# Patient Record
Sex: Male | Born: 1935
Health system: Southern US, Community
[De-identification: ages and names within clinical notes are randomized; demographics above are authoritative.]

## PROBLEM LIST (undated history)

## (undated) DIAGNOSIS — R011 Cardiac murmur, unspecified: Secondary | ICD-10-CM

## (undated) DIAGNOSIS — B029 Zoster without complications: Secondary | ICD-10-CM

## (undated) DIAGNOSIS — Z87442 Personal history of urinary calculi: Secondary | ICD-10-CM

## (undated) DIAGNOSIS — I2510 Atherosclerotic heart disease of native coronary artery without angina pectoris: Secondary | ICD-10-CM

## (undated) DIAGNOSIS — K5730 Diverticulosis of large intestine without perforation or abscess without bleeding: Secondary | ICD-10-CM

## (undated) DIAGNOSIS — I493 Ventricular premature depolarization: Secondary | ICD-10-CM

## (undated) DIAGNOSIS — I10 Essential (primary) hypertension: Secondary | ICD-10-CM

## (undated) DIAGNOSIS — M1990 Unspecified osteoarthritis, unspecified site: Secondary | ICD-10-CM

## (undated) DIAGNOSIS — R7303 Prediabetes: Secondary | ICD-10-CM

## (undated) DIAGNOSIS — C4490 Unspecified malignant neoplasm of skin, unspecified: Secondary | ICD-10-CM

## (undated) DIAGNOSIS — E785 Hyperlipidemia, unspecified: Secondary | ICD-10-CM

## (undated) DIAGNOSIS — I429 Cardiomyopathy, unspecified: Secondary | ICD-10-CM

## (undated) DIAGNOSIS — M6530 Trigger finger, unspecified finger: Secondary | ICD-10-CM

## (undated) DIAGNOSIS — N400 Enlarged prostate without lower urinary tract symptoms: Secondary | ICD-10-CM

## (undated) DIAGNOSIS — N189 Chronic kidney disease, unspecified: Secondary | ICD-10-CM

## (undated) DIAGNOSIS — I6529 Occlusion and stenosis of unspecified carotid artery: Secondary | ICD-10-CM

## (undated) DIAGNOSIS — N360 Urethral fistula: Secondary | ICD-10-CM

## (undated) DIAGNOSIS — I5189 Other ill-defined heart diseases: Secondary | ICD-10-CM

## (undated) DIAGNOSIS — R918 Other nonspecific abnormal finding of lung field: Secondary | ICD-10-CM

## (undated) DIAGNOSIS — Z8619 Personal history of other infectious and parasitic diseases: Secondary | ICD-10-CM

## (undated) DIAGNOSIS — I509 Heart failure, unspecified: Secondary | ICD-10-CM

## (undated) DIAGNOSIS — C44320 Squamous cell carcinoma of skin of unspecified parts of face: Secondary | ICD-10-CM

## (undated) HISTORY — PX: URINARY SPHINCTER IMPLANT: SHX2624

## (undated) HISTORY — DX: Occlusion and stenosis of unspecified carotid artery: I65.29

## (undated) HISTORY — DX: Other nonspecific abnormal finding of lung field: R91.8

## (undated) HISTORY — DX: Unspecified malignant neoplasm of skin, unspecified: C44.90

## (undated) HISTORY — DX: Benign prostatic hyperplasia without lower urinary tract symptoms: N40.0

## (undated) HISTORY — DX: Hyperlipidemia, unspecified: E78.5

## (undated) HISTORY — DX: Ventricular premature depolarization: I49.3

## (undated) HISTORY — DX: Atherosclerotic heart disease of native coronary artery without angina pectoris: I25.10

## (undated) HISTORY — DX: Other ill-defined heart diseases: I51.89

## (undated) HISTORY — DX: Squamous cell carcinoma of skin of unspecified parts of face: C44.320

## (undated) HISTORY — PX: APPENDECTOMY: SHX54

## (undated) HISTORY — DX: Diverticulosis of large intestine without perforation or abscess without bleeding: K57.30

## (undated) HISTORY — DX: Zoster without complications: B02.9

## (undated) HISTORY — DX: Cardiomyopathy, unspecified: I42.9

## (undated) HISTORY — PX: CATARACT EXTRACTION, BILATERAL: SHX1313

## (undated) SURGERY — BRONCHOSCOPY, FLEXIBLE
Anesthesia: Choice | Laterality: Bilateral

---

## 2000-01-01 ENCOUNTER — Ambulatory Visit (HOSPITAL_COMMUNITY): Admission: RE | Admit: 2000-01-01 | Discharge: 2000-01-01 | Payer: Self-pay | Admitting: Gastroenterology

## 2000-05-28 ENCOUNTER — Encounter: Admission: RE | Admit: 2000-05-28 | Discharge: 2000-05-28 | Payer: Self-pay | Admitting: Urology

## 2000-05-28 ENCOUNTER — Encounter: Payer: Self-pay | Admitting: Urology

## 2000-12-26 ENCOUNTER — Other Ambulatory Visit: Admission: RE | Admit: 2000-12-26 | Discharge: 2000-12-26 | Payer: Self-pay | Admitting: Urology

## 2000-12-26 ENCOUNTER — Encounter (INDEPENDENT_AMBULATORY_CARE_PROVIDER_SITE_OTHER): Payer: Self-pay | Admitting: Specialist

## 2001-01-07 ENCOUNTER — Ambulatory Visit (HOSPITAL_COMMUNITY): Admission: RE | Admit: 2001-01-07 | Discharge: 2001-01-07 | Payer: Self-pay | Admitting: Internal Medicine

## 2002-12-30 HISTORY — PX: MOHS SURGERY: SUR867

## 2003-05-10 ENCOUNTER — Encounter: Payer: Self-pay | Admitting: Internal Medicine

## 2003-05-10 ENCOUNTER — Ambulatory Visit (HOSPITAL_COMMUNITY): Admission: RE | Admit: 2003-05-10 | Discharge: 2003-05-10 | Payer: Self-pay | Admitting: Internal Medicine

## 2004-12-07 ENCOUNTER — Ambulatory Visit (HOSPITAL_COMMUNITY): Admission: RE | Admit: 2004-12-07 | Discharge: 2004-12-07 | Payer: Self-pay | Admitting: Gastroenterology

## 2004-12-13 ENCOUNTER — Ambulatory Visit (HOSPITAL_COMMUNITY): Admission: RE | Admit: 2004-12-13 | Discharge: 2004-12-13 | Payer: Self-pay | Admitting: Urology

## 2004-12-13 ENCOUNTER — Encounter (INDEPENDENT_AMBULATORY_CARE_PROVIDER_SITE_OTHER): Payer: Self-pay | Admitting: Specialist

## 2004-12-13 ENCOUNTER — Ambulatory Visit (HOSPITAL_BASED_OUTPATIENT_CLINIC_OR_DEPARTMENT_OTHER): Admission: RE | Admit: 2004-12-13 | Discharge: 2004-12-13 | Payer: Self-pay | Admitting: Urology

## 2004-12-30 HISTORY — PX: OTHER SURGICAL HISTORY: SHX169

## 2005-04-16 ENCOUNTER — Other Ambulatory Visit: Admission: RE | Admit: 2005-04-16 | Discharge: 2005-04-16 | Payer: Self-pay | Admitting: Otolaryngology

## 2005-04-29 ENCOUNTER — Ambulatory Visit (HOSPITAL_COMMUNITY): Admission: RE | Admit: 2005-04-29 | Discharge: 2005-04-29 | Payer: Self-pay | Admitting: Otolaryngology

## 2005-04-30 ENCOUNTER — Ambulatory Visit: Admission: RE | Admit: 2005-04-30 | Discharge: 2005-07-29 | Payer: Self-pay | Admitting: Radiation Oncology

## 2005-07-30 ENCOUNTER — Ambulatory Visit: Admission: RE | Admit: 2005-07-30 | Discharge: 2005-09-08 | Payer: Self-pay | Admitting: Radiation Oncology

## 2005-10-24 ENCOUNTER — Ambulatory Visit: Admission: RE | Admit: 2005-10-24 | Discharge: 2005-10-25 | Payer: Self-pay | Admitting: Radiation Oncology

## 2005-10-29 ENCOUNTER — Ambulatory Visit (HOSPITAL_COMMUNITY): Admission: RE | Admit: 2005-10-29 | Discharge: 2005-10-29 | Payer: Self-pay | Admitting: Radiation Oncology

## 2006-09-03 ENCOUNTER — Ambulatory Visit: Admission: RE | Admit: 2006-09-03 | Discharge: 2006-09-21 | Payer: Self-pay | Admitting: Radiation Oncology

## 2006-09-03 ENCOUNTER — Ambulatory Visit (HOSPITAL_COMMUNITY): Admission: RE | Admit: 2006-09-03 | Discharge: 2006-09-03 | Payer: Self-pay | Admitting: Radiation Oncology

## 2006-09-03 LAB — TSH: TSH: 4.113 u[IU]/mL (ref 0.350–5.500)

## 2006-12-02 ENCOUNTER — Encounter: Admission: RE | Admit: 2006-12-02 | Discharge: 2006-12-02 | Payer: Self-pay | Admitting: Internal Medicine

## 2007-01-29 ENCOUNTER — Encounter: Admission: RE | Admit: 2007-01-29 | Discharge: 2007-01-29 | Payer: Self-pay | Admitting: Orthopedic Surgery

## 2007-11-30 ENCOUNTER — Ambulatory Visit: Admission: RE | Admit: 2007-11-30 | Discharge: 2007-12-21 | Payer: Self-pay | Admitting: Radiation Oncology

## 2007-11-30 ENCOUNTER — Ambulatory Visit (HOSPITAL_COMMUNITY): Admission: RE | Admit: 2007-11-30 | Discharge: 2007-11-30 | Payer: Self-pay | Admitting: Radiation Oncology

## 2011-05-17 NOTE — Op Note (Signed)
NAMEBREYDON, [name] [name] NO.:  [id number]   MEDICAL RECORD NO.:  [id number]          PATIENT TYPE:  AMB   LOCATION:  [facility]                         FACILITY:  [facility]   PHYSICIAN:  Sigmund I. Patsi Sears, M.D.DATE OF BIRTH:  [birth year]   DATE OF PROCEDURE:  12/13/2004  DATE OF DISCHARGE:                                 OPERATIVE REPORT   PREOPERATIVE DIAGNOSES:  Persistent elevation in PSA with abnormal PSA II.   POSTOPERATIVE DIAGNOSES:  Persistent elevation in PSA with abnormal PSA II.   OPERATION:  Transperineal saturation biopsy.   SURGEON:  Sigmund I. Patsi Sears, M.D.   RADIATION THERAPIST:  Wynn Banker, M.D.   X-RAY TECHNOLOGIST:  Lanny Hurst.   PREPARATION:  After appropriate preanesthesia, the patient was brought to  the operating room, placed on the operating table in the dorsal supine  position where general LMA anesthesia was introduced.  He was then replaced  in dorsal lithotomy position where the pubis was prepped with Betadine  solution and draped in the usual fashion.   DESCRIPTION OF PROCEDURE:  The transrectal ultrasonography machine was  placed, and the patient underwent multiple transperineal biopsies with  saturation biopsies of the prostate.  He tolerated the procedure well.  A  Foley catheter was left in place from the beginning of the procedure.  He  was given a B&O suppository at the beginning of the procedure, and IV  Toradol at the end of the procedure.  He was awakened and taken to the  recovery room in good condition.     Sigm   SIT/MEDQ  D:  12/13/2004  T:  12/13/2004  Job:  [id number]

## 2011-09-17 ENCOUNTER — Other Ambulatory Visit: Payer: Self-pay | Admitting: Dermatology

## 2011-12-31 DIAGNOSIS — I2510 Atherosclerotic heart disease of native coronary artery without angina pectoris: Secondary | ICD-10-CM

## 2011-12-31 DIAGNOSIS — C3490 Malignant neoplasm of unspecified part of unspecified bronchus or lung: Secondary | ICD-10-CM

## 2011-12-31 HISTORY — DX: Malignant neoplasm of unspecified part of unspecified bronchus or lung: C34.90

## 2011-12-31 HISTORY — DX: Atherosclerotic heart disease of native coronary artery without angina pectoris: I25.10

## 2012-04-14 ENCOUNTER — Telehealth: Payer: Self-pay | Admitting: Oncology

## 2012-04-14 NOTE — Telephone Encounter (Signed)
called pt home lmovm to rtn call to schedule appt for 04/30 or 05/01

## 2012-04-17 ENCOUNTER — Telehealth: Payer: Self-pay | Admitting: *Deleted

## 2012-04-17 NOTE — Telephone Encounter (Signed)
Patient left VM requesting call back to let him know how to get some records (radiology reports) to Dr. Truett Perna prior to his new patient appointment. Forwarded request to medical records dept.

## 2012-04-20 ENCOUNTER — Telehealth: Payer: Self-pay | Admitting: Oncology

## 2012-04-20 NOTE — Telephone Encounter (Signed)
pt called and scheduled appt for 05/03.  will fax over a letter to Dr. Yetta Barre with appt d/t

## 2012-04-21 ENCOUNTER — Telehealth: Payer: Self-pay | Admitting: Oncology

## 2012-04-21 NOTE — Telephone Encounter (Signed)
Referred by Dr. Arminda Resides Dx- Melanoma

## 2012-05-01 ENCOUNTER — Telehealth: Payer: Self-pay | Admitting: *Deleted

## 2012-05-01 ENCOUNTER — Ambulatory Visit (HOSPITAL_BASED_OUTPATIENT_CLINIC_OR_DEPARTMENT_OTHER): Payer: Medicare Other

## 2012-05-01 ENCOUNTER — Ambulatory Visit: Payer: Medicare Other

## 2012-05-01 ENCOUNTER — Ambulatory Visit (HOSPITAL_BASED_OUTPATIENT_CLINIC_OR_DEPARTMENT_OTHER): Payer: Medicare Other | Admitting: Oncology

## 2012-05-01 ENCOUNTER — Other Ambulatory Visit: Payer: Self-pay | Admitting: *Deleted

## 2012-05-01 VITALS — BP 123/69 | HR 71 | Temp 97.8°F | Ht 63.0 in | Wt 163.6 lb

## 2012-05-01 DIAGNOSIS — R9431 Abnormal electrocardiogram [ECG] [EKG]: Secondary | ICD-10-CM

## 2012-05-01 DIAGNOSIS — C44300 Unspecified malignant neoplasm of skin of unspecified part of face: Secondary | ICD-10-CM

## 2012-05-01 DIAGNOSIS — Z85828 Personal history of other malignant neoplasm of skin: Secondary | ICD-10-CM

## 2012-05-01 DIAGNOSIS — I499 Cardiac arrhythmia, unspecified: Secondary | ICD-10-CM

## 2012-05-01 DIAGNOSIS — R918 Other nonspecific abnormal finding of lung field: Secondary | ICD-10-CM

## 2012-05-01 LAB — BASIC METABOLIC PANEL
Calcium: 8.9 mg/dL (ref 8.4–10.5)
Creatinine, Ser: 0.96 mg/dL (ref 0.50–1.35)
Glucose, Bld: 86 mg/dL (ref 70–99)
Sodium: 139 mEq/L (ref 135–145)

## 2012-05-01 NOTE — Progress Notes (Signed)
Referring MD: Peter Congo 76 y.o.  1936/06/28    Reason for Referral: History of squamous cell carcinoma of the right face.     HPI: He was diagnosed with squamous cell carcinoma the right upper cheek in 2004. He reports undergoing Mohs surgery. In 2006 he developed a nodule over the right face and a biopsy metastatic squamous cell carcinoma.Marland Kitchen He was referred to Dr. Erroll Luna at [facility] and underwent excision of the metastatic lymph node followed by radiation. A staging evaluation revealed no additional evidence of metastatic disease.  He reports undergoing surveillance chest x-rays for the past several years. A chest x-ray on 02/26/2012 at 10 about medical revealed left hilar fullness compared to remote studies dating to 2007. Left hilar adenopathy could not be excluded. A chest CT was recommended. Dr. Valentina Lucks recommended a followup chest x-ray in approximately 6 months.  Mr. Curfman requested an oncology evaluation. He reports an intermittent cough during January and February of this year. This has resolved. He reports an evaluation was non-diagnostic.  Past medical history: 1. BPH-followed by Dr. Cassell Smiles 2. Hypertension 3. Multiple skin cancers including basal cell carcinomas and squamous cell carcinomas in addition to the right face cancer mentioned in the history of present illness 4. "Shingles "of the face approximate 5 years ago  Past surgical history: 1. Bilateral cataract surgery 2. Appendectomy as a child   Family history: His father died of lung cancer at age 12, no other family history of cancer. 2 children have psoriasis and psoriatic arthritis. He has one sister.  Current outpatient prescriptions:aspirin 81 MG tablet, Take 81 mg by mouth daily., Disp: , Rfl: ;  B Complex Vitamins (B COMPLEX 1) tablet, Take 1 tablet by mouth daily., Disp: , Rfl: ;  Calcium-Vitamin D-Vitamin K 750-500-40 MG-UNT-MCG TABS, Take 1 tablet by mouth daily., Disp: , Rfl: ;   Cholecalciferol (VITAMIN D3) 1000 UNITS CAPS, Take 1,000 Units by mouth daily., Disp: , Rfl:  finasteride (PROSCAR) 5 MG tablet, Take 5 mg by mouth Daily., Disp: , Rfl: ;  Misc Natural Products (PROSTATE THERAPY COMPLEX PO), Take 1 capsule by mouth daily., Disp: , Rfl: ;  Multiple Vitamins-Minerals (CENTRUM PO), Take 1 tablet by mouth daily., Disp: , Rfl: ;  Omega 3-6-9 Fatty Acids (TRIPLE OMEGA COMPLEX PO), Take 1 capsule by mouth daily., Disp: , Rfl: ;  Tamsulosin HCl (FLOMAX) 0.4 MG CAPS, Take 0.4 mg by mouth Daily., Disp: , Rfl:  Testosterone (AXIRON) 30 MG/ACT SOLN, Place onto the skin daily., Disp: , Rfl:   Allergies: Allergies no known allergies  Social History: He is retired from an office occupation. He quit smoking cigarettes 46 years ago. He drinks alcohol occasionally. He denies risk factors for HIV and hepatitis. He was in [employer] from [phone]. No known unusual exposures aside from UV radiation to the face for acne therapy as a young adult.   ROS:   Positives include: Nocturia-once each night, discomfort at the left knee, muscle pain and memory loss when he was treated with Lipitor, "snores "  A complete ROS was otherwise negative.  Physical Exam:  Blood pressure 123/69, pulse 71, temperature 97.8 F (36.6 C), temperature source Oral, height 5\' 3"  (1.6 m), weight 163 lb 9.6 oz (74.208 kg).  HEENT: Oropharynx without visible mass, post surgical and radiation changes over the right face and neck. No evidence of recurrent tumor. Neck without mass. Lungs: Clear bilaterally Cardiac: Irregular, grouped beats Abdomen: No hepatosplenic, no mass  Vascular: No leg edema Lymph nodes: No cervical, supraclavicular, axillary, or inguinal lymph nodes Neurologic: Alert and oriented, the motor exam appears grossly intact in the upper and lower extremities Skin: Multiple benign appearing moles over the trunk. Multiple scars from skin biopsies.   Radiology: I reviewed the 02/26/2012 chest  x-ray in a [facility] radiology. Compared to a chest x-ray from February of 2012 there's been no change in the fullness at the left hilum, though there is increased fullness compared to a chest x-ray from 2007.  EKG: Sinus rhythm with premature ventricular complexes  Assessment/Plan:   1. Squamous cell carcinoma of the right cheek, status post Mohs surgery in 2004 2. Local recurrence of squamous cell carcinoma and a right face lymph node in 2006, status post surgical excision followed by radiation 3. Left hilar fullness on a chest x-ray 02/26/2012-stable compared to a chest x-ray from February of 2012 and new compared to a chest x-ray 2007 4. History of BPH 5. History of multiple skin cancers including basal cell carcinoma and squamous cell carcinomas 6. Irregular heart rate-an EKG revealed a sinus rhythm with frequent premature complexes and bigeminy   Disposition:   He has a history of recurrent squamous cell carcinoma of the right face. He underwent surgery and radiation for treatment of a local nodal recurrence in 2006. I have a low clinical suspicion for recurrent squamous cell carcinoma. There is no clinical or physical exam evidence of recurrent disease. I suspect the left hilar fullness on the chest x-ray is a benign finding such as the pulmonary artery. I agree with the planned by Dr. Valentina Lucks to obtain a 6 month followup chest x-ray.  Mr.Macchia is not comfortable with this approach. He would like to proceed with the chest CT scan as recommended by the radiologist. I think this is reasonable given the change in his chest x-ray from 2007. He'll be scheduled for a contrast CT of the chest to evaluate the left hilum. We will contact him with this result.  He plans to continue clinical followup with Drs. Loralie Champagne. I did not schedule a followup appointment at cancer Center. I will see him in the future as needed.  We will forward today's EKG to Dr. Valentina Lucks.  Kensington Rios,  Bain Whichard 05/01/2012, 4:56 PM

## 2012-05-01 NOTE — Telephone Encounter (Signed)
gave patient appointment for 05-05-2012 for ct of the chest arrival time is 8:15am printed out calendar and gave to the patient

## 2012-05-05 ENCOUNTER — Telehealth: Payer: Self-pay | Admitting: *Deleted

## 2012-05-05 ENCOUNTER — Ambulatory Visit (HOSPITAL_COMMUNITY)
Admission: RE | Admit: 2012-05-05 | Discharge: 2012-05-05 | Disposition: A | Discharge status: Home / Self Care | Payer: Medicare Other | Source: Ambulatory Visit | Attending: Oncology | Admitting: Oncology

## 2012-05-05 DIAGNOSIS — I2510 Atherosclerotic heart disease of native coronary artery without angina pectoris: Secondary | ICD-10-CM | POA: Insufficient documentation

## 2012-05-05 DIAGNOSIS — K449 Diaphragmatic hernia without obstruction or gangrene: Secondary | ICD-10-CM | POA: Insufficient documentation

## 2012-05-05 DIAGNOSIS — C44319 Basal cell carcinoma of skin of other parts of face: Secondary | ICD-10-CM | POA: Insufficient documentation

## 2012-05-05 DIAGNOSIS — C44300 Unspecified malignant neoplasm of skin of unspecified part of face: Secondary | ICD-10-CM

## 2012-05-05 DIAGNOSIS — R911 Solitary pulmonary nodule: Secondary | ICD-10-CM | POA: Insufficient documentation

## 2012-05-05 MED ORDER — IOHEXOL 300 MG/ML  SOLN
80.0000 mL | Freq: Once | INTRAMUSCULAR | Status: AC | PRN
  Administered 2012-05-05: 80 mL via INTRAVENOUS

## 2012-05-05 NOTE — Telephone Encounter (Signed)
THIS REPORT WAS CALLED AND FAXED. IT WAS GIVEN TO DR.SHERRILL.

## 2012-05-06 ENCOUNTER — Other Ambulatory Visit: Payer: Self-pay | Admitting: Oncology

## 2012-05-06 DIAGNOSIS — R918 Other nonspecific abnormal finding of lung field: Secondary | ICD-10-CM

## 2012-05-08 ENCOUNTER — Telehealth: Payer: Self-pay | Admitting: Oncology

## 2012-05-08 NOTE — Telephone Encounter (Signed)
called pts home s/w wife and provided pet scan appt for 05/20 @ [facility]. Also informed her that Dr. Tyrone Sage office will call and provided appt d/t.  Gv completeted referal to med recs to sche appt

## 2012-05-11 ENCOUNTER — Other Ambulatory Visit: Payer: Self-pay | Admitting: Cardiothoracic Surgery

## 2012-05-11 DIAGNOSIS — D381 Neoplasm of uncertain behavior of trachea, bronchus and lung: Secondary | ICD-10-CM

## 2012-05-13 ENCOUNTER — Encounter: Payer: Self-pay | Admitting: Oncology

## 2012-05-13 NOTE — Progress Notes (Signed)
08/13/2012 I received a call from the AIM nurse reviewer RE: Pet Scan request for this patient.  The Pet Scan will require a peer-to-peer. Below is the necessary information if you would like to do this discussion.  Member ID [id number] DOB [birth year]                                        [phone]  Opt. 2  I await your direction.  This scan is scheduled for May 18, 2012.  Bonita Quin [phone]

## 2012-05-15 NOTE — Progress Notes (Signed)
Spoke with Dr Windle Guard, AIM physician for approval of PET.  Approval # [id number].  Valid until 11/14.  Bonita Quin, Pre-Cert specialist aware. dph

## 2012-05-18 ENCOUNTER — Encounter (HOSPITAL_COMMUNITY)
Admission: RE | Admit: 2012-05-18 | Discharge: 2012-05-18 | Disposition: A | Discharge status: Home / Self Care | Payer: Medicare Other | Source: Ambulatory Visit | Attending: Oncology | Admitting: Oncology

## 2012-05-18 ENCOUNTER — Ambulatory Visit (HOSPITAL_COMMUNITY)
Admission: RE | Admit: 2012-05-18 | Discharge: 2012-05-18 | Disposition: A | Discharge status: Home / Self Care | Payer: Medicare Other | Source: Ambulatory Visit | Attending: Cardiothoracic Surgery | Admitting: Cardiothoracic Surgery

## 2012-05-18 DIAGNOSIS — C44320 Squamous cell carcinoma of skin of unspecified parts of face: Secondary | ICD-10-CM | POA: Insufficient documentation

## 2012-05-18 DIAGNOSIS — R918 Other nonspecific abnormal finding of lung field: Secondary | ICD-10-CM

## 2012-05-18 DIAGNOSIS — R0609 Other forms of dyspnea: Secondary | ICD-10-CM | POA: Insufficient documentation

## 2012-05-18 DIAGNOSIS — R0989 Other specified symptoms and signs involving the circulatory and respiratory systems: Secondary | ICD-10-CM | POA: Insufficient documentation

## 2012-05-18 DIAGNOSIS — D381 Neoplasm of uncertain behavior of trachea, bronchus and lung: Secondary | ICD-10-CM

## 2012-05-18 DIAGNOSIS — C3410 Malignant neoplasm of upper lobe, unspecified bronchus or lung: Secondary | ICD-10-CM | POA: Insufficient documentation

## 2012-05-18 DIAGNOSIS — C4490 Unspecified malignant neoplasm of skin, unspecified: Secondary | ICD-10-CM | POA: Insufficient documentation

## 2012-05-18 DIAGNOSIS — N400 Enlarged prostate without lower urinary tract symptoms: Secondary | ICD-10-CM | POA: Insufficient documentation

## 2012-05-18 DIAGNOSIS — R222 Localized swelling, mass and lump, trunk: Secondary | ICD-10-CM | POA: Insufficient documentation

## 2012-05-18 DIAGNOSIS — B029 Zoster without complications: Secondary | ICD-10-CM | POA: Insufficient documentation

## 2012-05-18 DIAGNOSIS — Z01811 Encounter for preprocedural respiratory examination: Secondary | ICD-10-CM | POA: Insufficient documentation

## 2012-05-18 LAB — PULMONARY FUNCTION TEST

## 2012-05-18 LAB — GLUCOSE, CAPILLARY: Glucose-Capillary: 97 mg/dL (ref 70–99)

## 2012-05-18 MED ORDER — ALBUTEROL SULFATE (5 MG/ML) 0.5% IN NEBU
2.5000 mg | INHALATION_SOLUTION | Freq: Once | RESPIRATORY_TRACT | Status: AC
  Administered 2012-05-18: 2.5 mg via RESPIRATORY_TRACT

## 2012-05-18 MED ORDER — FLUDEOXYGLUCOSE F - 18 (FDG) INJECTION
14.6000 | Freq: Once | INTRAVENOUS | Status: AC | PRN
  Administered 2012-05-18: 14.6 via INTRAVENOUS

## 2012-05-19 ENCOUNTER — Encounter: Payer: Self-pay | Admitting: Cardiothoracic Surgery

## 2012-05-19 ENCOUNTER — Institutional Professional Consult (permissible substitution) (INDEPENDENT_AMBULATORY_CARE_PROVIDER_SITE_OTHER): Payer: Medicare Other | Admitting: Cardiothoracic Surgery

## 2012-05-19 VITALS — BP 119/69 | HR 72 | Resp 18 | Ht 63.0 in | Wt 163.0 lb

## 2012-05-19 DIAGNOSIS — R222 Localized swelling, mass and lump, trunk: Secondary | ICD-10-CM

## 2012-05-19 DIAGNOSIS — R918 Other nonspecific abnormal finding of lung field: Secondary | ICD-10-CM

## 2012-05-19 NOTE — Progress Notes (Addendum)
[address]            [place] [address]          [phone]      [name] [name] Medical Record #[id number] Date of Birth: [birth year]  Referring: [name] Artist, MD Primary Care: Lillia Mountain, MD, MD  Chief Complaint:    Chief Complaint  Patient presents with  . Lung Mass    Referral from Dr Truett Perna for eval on Left upper lobe lung nodule, PET Scan, PFT'S on 05/18/12    History of Present Illness:    Patient is [name] with good functional status who had noted some chronic cough over the past several months nonproductive and without hemoptysis recent visit to his dermatologists and discussion about the cough led to [name] chest x-ray and subsequently [name] CT scan and referral to Dr. Elnita Maxwell for [name] left lung mass. The patient has [name] history of squamous cell carcinoma involving the right face and then with [name] metastatic deposit in the right cheek in 2006 he sets 3 surgical excisions and then followed with radiation treatment to the right head and neck by Dr. Kathrynn Running in 2006. The patient has [name] family history of carcinoma of the lung he is [name] distant smoker quitting in 1967, been smoking for approximately 15 years. After evaluation with CT scan PET scan and PFTs he presents to the office to talk about surgical options for treatment of presumed carcinoma lung.      Current Activity/ Functional Status: Patient is independent with mobility/ambulation, transfers, ADL's, IADL's.   Past Medical History  Diagnosis Date  . Lung mass   . Squamous cell carcinoma, face     history of, right upper cheek in 2004   . Skin cancer     Multiple skin cancers   . BPH (benign prostatic hyperplasia)   . Shingles     Past Surgical History  Procedure Date  . Mohs surgery 2004  . Excision ot metastatic lymph node followed by radiation 2006    Dr Erroll Luna at [facility]  . Cataract extraction, bilateral   . Appendectomy     Family History    Problem Relation Age of Onset  . Lung cancer Father   . Psoriasis Child   . Arthritis Child   . Heart disease Mother    patient has one son who developed sudden cardiac arrest and was thought to have Brugada syndrome.     History  Smoking status  . Former Smoker  . Types: Cigarettes  . Quit date: 12/30/1965  Smokeless tobacco  . Never Used    History  Alcohol Use  . Yes     No Known Allergies  Current Outpatient Prescriptions  Medication Sig Dispense Refill  . aspirin 81 MG tablet Take 81 mg by mouth daily.      . Cholecalciferol (VITAMIN D3) 1000 UNITS CAPS Take 1,000 Units by mouth daily.      . finasteride (PROSCAR) 5 MG tablet Take 5 mg by mouth Daily.      . Misc Natural Products (PROSTATE THERAPY COMPLEX PO) Take 1 capsule by mouth daily.      . Multiple Vitamins-Minerals (CENTRUM PO) Take 1 tablet by mouth daily.      Ailene Ards 3-6-9 Fatty Acids (TRIPLE OMEGA COMPLEX PO) Take 1 capsule by mouth daily.      Marland Kitchen  Tamsulosin HCl (FLOMAX) 0.4 MG CAPS Take 0.4 mg by mouth Daily.      . Testosterone (AXIRON) 30 MG/ACT SOLN Place onto the skin daily.      . B Complex Vitamins (B COMPLEX 1) tablet Take 1 tablet by mouth daily.           Review of Systems:     Cardiac Review of Systems: Y or N  Chest Pain [ n   ]  Resting SOB [n   ] Exertional SOB  [ n ]  Orthopnea [n  ]   Pedal Edema [ n  ]    Palpitations [n  ] Syncope  [n  ]   Presyncope [ n  ]  General Review of Systems: [Y] = yes [  ]=no Constitional: recent weight change Cove.Etienne  ]; anorexia [  ]; fatigue [  ]; nausea [  ]; night sweats [  ]; fever [ n ]; or chills [ n ];                                                                                                                                          Dental: poor dentition[ n ]; Last Dentist visit:partial plates  Eye : blurred vision [  ]; diplopia [   ]; vision changes [  ];  Amaurosis fugax[  ]; Resp: cough [  ];  wheezing[  ];  hemoptysis[  ]; shortness of  breath[  ]; paroxysmal nocturnal dyspnea[  ]; dyspnea on exertion[  ]; or orthopnea[  ];  GI:  gallstones[  ], vomiting[  ];  dysphagia[  ]; melena[  ];  hematochezia [  ]; heartburn[  ];   Hx of  Colonoscopy[  ]; GU: kidney stones [  ]; hematuria[n  ];   dysuria [  ];  nocturia[ 1 time  ];  history of     obstruction [n  ];             Skin: rash, swelling[  ];, hair loss[  ];  peripheral edema[  ];  or itching[  ]; Musculosketetal: myalgias[  ];  joint swelling[mild  ];  joint erythema[  ];  joint pain[  ];  back pain[  ];  Heme/Lymph: bruising[  ];  bleeding[  ];  anemia[  ];  Neuro: TIA[  ];  headaches[  ];  stroke[  ];  vertigo[  ];  seizures[  ];   paresthesias[  ];  difficulty walking[  ];  Psych:depression[  ]; anxiety[  ];  Endocrine: diabetes[  ];  thyroid dysfunction[  ];  Immunizations: Flu Cove.Etienne  ]; Pneumococcal[y  ];  Other:  Physical Exam: BP 119/69  Pulse 72  Resp 18  Ht 5\' 3"  (1.6 m)  Wt 163 lb (73.936 kg)  BMI 28.87 kg/m2  SpO2 98%  General appearance: alert, cooperative and appears stated age  Neurologic: intact Heart: regular rate and rhythm, S1, S2 normal, no murmur, click, rub or gallop and normal apical impulse Lungs: clear to auscultation bilaterally and normal percussion bilaterally Abdomen: soft, non-tender; bowel sounds normal; no masses,  no organomegaly Extremities: extremities normal, atraumatic, no cyanosis or edema and Homans sign is negative, no sign of DVT The patient has no cervical or supraclavicular adenopathy he does have some erythematous skin changes in the right neck which she relates to his previous radiation treatment Patient has no carotid bruits, femoral pulses popliteal DP and PT pulses are all full and intact.   Diagnostic Studies & Laboratory data:     Recent Radiology Findings:  Ct Chest W Contrast  05/05/2012  *RADIOLOGY REPORT*  Clinical Data: History of squamous cell carcinoma of the right side the face.  Question left hilar  adenopathy.  CT CHEST WITH CONTRAST  Technique:  Multidetector CT imaging of the chest was performed following the standard protocol during bolus administration of intravenous contrast.  Contrast: 80mL OMNIPAQUE IOHEXOL 300 MG/ML  SOLN  Comparison: Plain film of 02/26/2012.  Most recent chest CT of 10/29/2005.  Findings: Lung windows demonstrate mild right apical scarring. Posterior left upper lobe lung nodule, corresponding to the plain film abnormality.  This measures 2.4 x 2.6 cm on transverse image 28 and 2.8 cm on sagittal image 67.  This contacts the left major fissure, and extension into the superior segment left lower lobe cannot be excluded (also sagittal image 67). The lesion is positioned immediately adjacent to the descending aorta, including on image 28 of series 2.  Contacts the left upper lobe bronchus.  Soft tissue windows demonstrate no supraclavicular adenopathy. Heart size upper normal. Multivessel coronary artery atherosclerosis.  No pericardial or pleural effusion.  Small middle mediastinal nodes, without adenopathy.  [name] left paratracheal node measures 7 mm on image 26 versus 5 mm on the prior.  No hilar adenopathy.  Tiny hiatal hernia  Limited abdominal imaging demonstrates normal adrenal glands.  No significant findings.  Similar bone island within the upper thoracic spine on image 16.  IMPRESSION:  Plain film abnormality corresponds to [name] posterior left upper lobe lung nodule, consistent with primary bronchogenic carcinoma. Although this contacts the left major fissure and is immediately adjacent the aorta, there is no evidence of nodal metastasis.  By imaging, presuming non-small cell histology, T1bN0M0 or stage IIA  These results will be called to the ordering clinician or representative by the Radiologist Assistant, and communication documented in the PACS Dashboard.  Original Report Authenticated By: Consuello Bossier, M.D.   Nm Pet Image Initial (pi) Skull Base To Thigh  05/18/2012   *RADIOLOGY REPORT*  Clinical Data: Initial treatment strategy for posterior left upper lobe lung nodule suspicious for primary bronchogenic carcinoma.  NUCLEAR MEDICINE PET CT SKULL BASE TO THIGH  Technique:  Technique:  14.6 mCi F-18 FDG was injected intravenously. CT data was obtained and used for attenuation correction and anatomic localization only.  (This was not acquired as [name] diagnostic CT examination.) Additional exam technical data entered on technologist worksheet.  Comparison: Chest CT 05/05/2012.  Prior PET of 04/29/2005.  Findings: Neck: No abnormal hypermetabolism.  Chest:  Hypermetabolism corresponding to the previously described posterior left upper lobe lung nodule.  This measures 2.7 cm and [name] S.U.V. max of 15.4 on image 78.  No abnormal thoracic nodal activity.  Abdomen/Pelvis:  Likely physiologic hypermetabolism about the right side of the anus, no CT correlate.  [place]:  No hypermetabolism to  suggest osseous metastasis.  CT images performed for attenuation correction demonstrate surgical changes within the right side of the neck.  Right-sided thyroid atrophy.  Chest findings deferred to recent diagnostic chest CT.  Coronary artery atherosclerosis. No acute superimposed process.  Normal adrenal glands.  Left greater than right renal collecting systems stones.  Right common iliac artery aneurysm, 1.6 cm on image 176.  Mild prostatomegaly.  Probable bone island in the sacrum.  Scattered sclerotic lesions within the thoracic spine are also likely bone islands.  IMPRESSION:  1.  Hypermetabolic posterior left upper lobe lung nodule, most consistent with primary bronchogenic carcinoma.  No evidence of thoracic nodal or extrathoracic hypermetabolic disease.  Presuming non-small cell histology, T1bN0M0 or stage IIA disease. 2.  Surgical changes in the right side of the neck, without hypermetabolic residual or recurrent disease. 3.  Right common iliac artery aneurysm. 4.  Renal calculi.  Original Report  Authenticated By: Consuello Bossier, M.D.   PFT'S:  FVC 3.26, FEV1 2.62, DLCO 77%     Recent Lab Findings: Lab Results  Component Value Date   GLUCOSE 86 05/01/2012   NA 139 05/01/2012   K 3.9 05/01/2012   CL 101 05/01/2012   CREATININE 0.96 05/01/2012   BUN 20 05/01/2012   CO2 28 05/01/2012   TSH 5.698* 11/30/2007   EKG: 5/13/20013 Sinus rhythm with frequent Premature ventricular complexes in [name] pattern of bigeminy Possible Left atrial enlargement Left ventricular hypertrophy Nonspecific T wave abnormality Prolonged QT Abnormal ECG Since last tracing vent. bigeminy is new. 79mm/s 60mm/mV 100Hz  8.0.1 12SL 237 CID: [id number]  Assessment / Plan:    Patient is [name] with previous history of squamous cell carcinoma involving the right cheek in 2006 now presents with [name] PET avid 2.7 cm mass involving the left upper lobe centrally, from [name] clinical and radiographic standpoint this appears to be [name] T1bN0M0 or stage IIA carcinoma of the lung, but we have no tissue confirmation of this. The patient has good functional status and pulmonary function studies I recommended to him that we proceed with preoperative clearance with cardiology because of his abnormal EKG, and family history of Brugada syndrome. We discussed preoperative attempts at obtaining [name] tissue diagnosis, however I have told him I would still recommend surgical resection of the mass even if the needle biopsy was negative because of its suspicious nature, new onset, PET avid. It appears to be [name] new primary lung cancer, there is no evidence that it is metastatic spread from his previous malignancy.  The risks and options of surgery for the treatment of stage I lung cancer is discussed with the patient and his wife in detail, including the risks. Although the goal of surgery would be lobectomy, the position of the tumor on the pulmonary artery could necessitate [name] pneumonectomy.  The patient would like to wait to have surgery until after June  17, in the meantime we will arrange with [facility] cardiology to see the patient for preoperative cardiac clearance.  The goals risks and alternatives of the planned surgical procedure bronchoscopy left video-assisted thoracoscopy left upper lobectomy have been discussed with the patient in detail. The risks of the procedure including death, infection, stroke, myocardial infarction, bleeding, blood transfusion have all been discussed specifically.  I have quoted [name] [name]  3 % of perioperative mortality and [name] complication rate as high as 15 %. The patient's questions have been answered.[name] [name] is willing  to proceed with the planned procedure.  Delight Ovens MD  Beeper [phone] Office [phone] 05/20/2012 3:34 PM

## 2012-05-22 ENCOUNTER — Encounter: Payer: Self-pay | Admitting: Oncology

## 2012-05-29 ENCOUNTER — Other Ambulatory Visit: Payer: Self-pay | Admitting: Cardiology

## 2012-05-30 HISTORY — PX: CARDIAC CATHETERIZATION: SHX172

## 2012-06-01 ENCOUNTER — Other Ambulatory Visit: Payer: Self-pay | Admitting: Cardiology

## 2012-06-01 NOTE — H&P (Signed)
Tt/pre-cath work up.      HPI:     General:           [name] [name] is a 76 yo male recently seen by [name] [name] for cardiac clearance prior to lung surgery by [name] [name]. He has a hx of hypermetabolic posterior left upper lobe lung nodule most consistent with primary bronchogenic carcinoma without evidence of extrathoracic hypermetabolic disease. EKG with NSR with frequent bigeminal PVC's as well as LVH. He does have a family history of sudden cardiac death with his son having Brugada's syndrome and was resuscitated. His mother had a cardiomyopathy.. Preop out pt testing with noted Nuclear stress test with dilated cardiomyopathy, ejection fraction 28%, + distal anterolateral wall ischemia. Echocardiogram with inferior wall hypokinesis, EF 30-35% and grade 2 diastolic dysfunction. Plan to proceed with cardiac catheterization on June 4,2013 with [name] [name].. The patient denies any chest pain, SOB, DOE, LE edema, dizziness, palpitations or syncope. He states he was mowing the yard this week end without difficulty. However he is aware of increase fatigue over time.. .      ROS:      as noted in HPI, no allergies to IVP dye, no GI complaints, no black or bloody BMs, no fever, chills, + sinus congestion, and cough earlier this year has greatly improved, no wheezing, problems with poor sleep.     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      Surgical History: appendectomy , sebaceous cyst removal , prostate biopsy x4 , precancerous skin lesion , right nose Mohs surgery 2004, wide resection of metastatic anterior facial vein node and overlying skin and selective neck dissection [facility] 2006, squamous cell carcinomas removed both hands , cataracts OU , squamous cell off hands and scalp 2011.      Family History:  Father: deceased 72 yrs Dementia Mother: deceased 74 yrs Lung cancer Sister 1: alive Diabetes mellitus Son(s): Psoriatic arthritis. Another son pars planitis, psoriasis. Sudden cardiac death resuscitated due to Brugada syndrome 1 sister(s) . 2 son(s) .      Social History:      General: History of smoking  cigarettes: Former smoker, Quit in year 1967, Pack-year Hx: 47.  Alcohol: 2-3 wine a week. Exercise: around house,, nothing structured. 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 Marital Status: Married.      Medications: Aspir-81 81 MG Tablet Delayed Release 1 tablet Once a day, B Complex Tablet as directed , Vitamin D 1000 UNIT Tablet 1 tablet Once a day, Tamsulosin HCl 0.4 MG Capsule 1 capsule 30 minutes after the same meal each day Once a day, Finasteride 5 MG Tablet 1 tablet Once a day, Triple Omega-3-6-9 Capsule as directed , Axiron 30 mg/1.5 ml (1 pump) Liquid (alcohol based Apply one pump has not used x 1 month, MVI as directed , OTC prostate complex, as directed , Medication List reviewed and reconciled with the patient     Allergies: Lipitor: myalgias.      Objective:    Vitals: Wt 160.8, Wt change -1 lb, Ht 63.25, BMI 28.26, Pulse sitting 76, BP sitting 124/72.       Examination:     Cardiology, General:         GENERAL APPEARANCE: pleasant, NAD.  HEENT: unremarkable.  CAROTID UPSTROKE: normal, no bruit.  JVD: flat.  HEART SOUNDS: regular, normal S1, S2, no S3 or S4.  MURMUR: absent.  LUNGS: no rales or wheezes.  ABDOMEN: soft, non tender, positive bowel sounds, .  EXTREMITIES: no leg edema.   PERIPHERAL PULSES: 2 plus bilateral.            Assessment:    Assessment:  1. Abnormal stress test - 794.39 (Primary)   2. Essential hypertension, benign - 401.1     Plan:    1. Abnormal stress test        LAB: Basic Metabolic     GLUCOSE 83 70-99 - mg/dL        BUN 17 6-26 - mg/dL        CREATININE 0.99 0.60-1.30 - mg/dl        eGFR (NON-AFRICAN AMERICAN) 74 >60 - calc        eGFR (AFRICAN AMERICAN) 89 >60 - calc        SODIUM 140 136-145 - mmol/L        POTASSIUM 3.9 3.5-5.5 - mmol/L        CHLORIDE 106 98-107 - mmol/L        C02 31 22-32 - mg/dL        ANION GAP 6.8 6.0-20.0 - mmol/L        CALCIUM 9.1 8.6-10.3 - mg/dL               [name] [name] A 05/29/2012 04:46:15 PM > ok for cath        LAB: CBC with Diff     WBC 6.3 4.0-11.0 - K/ul        RBC 4.77 4.20-5.80 - M/uL        HGB 14.3 13.0-17.0 - g/dL        HCT 43.9 39.0-52.0 - %        MCH 29.9 27.0-33.0 - pg        MPV 8.7 7.5-10.7 - fL        MCV 92.1 80.0-94.0 - fL        MCHC 32.5 32.0-36.0 - g/dL        RDW 13.6 11.5-15.5 - %        NRBC# 0.00 -        PLT 169 150-400 - K/uL         NEUT % 75.9 43.3-71.9 - % H       NRBC% 0.10 - %         LYMPH% 12.0 16.8-43.5 - % L       MONO % 10.0 4.6-12.4 - %        EOS % 1.6 0.0-7.8 - %        BASO % 0.5 0.0-1.0 - %        NEUT # 4.8 1.9-7.2 - K/uL         LYMPH# 0.80 1.10-2.70 - K/uL L       MONO # 0.6 0.3-0.8 - K/uL        EOS # 0.1 0.0-0.6 - K/uL        BASO # 0.0 0.0-0.1 - K/uL               [name] [name] A 05/29/2012 03:51:02 PM > ok for cath        LAB: PT and PTT ([id number])       aPTT 28 24-33 - SEC        INR 1.0 0.8-1.2 -        Prothrombin Time 10.6 9.1-12.0 - SEC               [name] [name] A 05/31/2012 05:02:15 PM > ok, for cath   With abnormal nuclear stress test and echocardiogram we will proceed with cardiac catheterization, Risks and benefits of cardiac catheterization have been reviewed including risk of stroke, heart attack, death,  bleeding, renal impariment and arterial damage. There was ample oppurtuny to answer questions. Alternatives were discussed. Patient understands and wishes to proceed.  [name] [name] has discussed test results with pt, [name] [name] and [name] [name] by phone last night, [name] [name] will be out of town next week. Pt will proceed with cardiac cath with [name] [name] prior to further consideration for cardiac clearance for lung mass removal.       2. Essential hypertension, benign   stable.          Immunizations:       Labs:      Procedure Codes: 80048 ECL BMP, 85025 ECL CBC PLATELET DIFF, 36415 BLOOD COLLECTION ROUTINE VENIPUNCTURE     Preventive:           Follow Up: TT pending cath (Reason: cardiac cath..)        Provider: Jaydan Chretien A. Tashiana Lamarca, NP  Patient: [name], [name] H  DOB: 05/24/1936  Date: 05/29/2012   

## 2012-06-02 ENCOUNTER — Encounter (HOSPITAL_BASED_OUTPATIENT_CLINIC_OR_DEPARTMENT_OTHER): Payer: Self-pay | Admitting: Cardiology

## 2012-06-02 ENCOUNTER — Encounter (HOSPITAL_BASED_OUTPATIENT_CLINIC_OR_DEPARTMENT_OTHER)
Admission: RE | Disposition: A | Discharge status: Home / Self Care | Payer: Self-pay | Source: Ambulatory Visit | Attending: Cardiology

## 2012-06-02 ENCOUNTER — Telehealth: Payer: Self-pay | Admitting: *Deleted

## 2012-06-02 ENCOUNTER — Inpatient Hospital Stay (HOSPITAL_BASED_OUTPATIENT_CLINIC_OR_DEPARTMENT_OTHER)
Admission: RE | Admit: 2012-06-02 | Discharge: 2012-06-02 | Disposition: A | Discharge status: Home / Self Care | Payer: Medicare Other | Source: Ambulatory Visit | Attending: Cardiology | Admitting: Cardiology

## 2012-06-02 DIAGNOSIS — N400 Enlarged prostate without lower urinary tract symptoms: Secondary | ICD-10-CM | POA: Insufficient documentation

## 2012-06-02 DIAGNOSIS — I10 Essential (primary) hypertension: Secondary | ICD-10-CM | POA: Insufficient documentation

## 2012-06-02 DIAGNOSIS — R9439 Abnormal result of other cardiovascular function study: Secondary | ICD-10-CM | POA: Insufficient documentation

## 2012-06-02 DIAGNOSIS — R5381 Other malaise: Secondary | ICD-10-CM | POA: Insufficient documentation

## 2012-06-02 DIAGNOSIS — E785 Hyperlipidemia, unspecified: Secondary | ICD-10-CM | POA: Insufficient documentation

## 2012-06-02 SURGERY — JV LEFT HEART CATHETERIZATION WITH CORONARY ANGIOGRAM
Anesthesia: Moderate Sedation

## 2012-06-02 MED ORDER — RAMIPRIL 2.5 MG PO CAPS: 2.5000 mg | ORAL_CAPSULE | Freq: Every day | ORAL | Status: DC

## 2012-06-02 MED ORDER — ISOSORBIDE MONONITRATE ER 30 MG PO TB24: 30.0000 mg | ORAL_TABLET | Freq: Every day | ORAL | Status: DC

## 2012-06-02 MED ORDER — SODIUM CHLORIDE 0.9 % IV SOLN: INTRAVENOUS | Status: DC

## 2012-06-02 MED ORDER — SODIUM CHLORIDE 0.9 % IJ SOLN: 3.0000 mL | INTRAMUSCULAR | Status: DC | PRN

## 2012-06-02 MED ORDER — SODIUM CHLORIDE 0.9 % IV SOLN: 250.0000 mL | INTRAVENOUS | Status: DC | PRN

## 2012-06-02 MED ORDER — SODIUM CHLORIDE 0.9 % IJ SOLN: 3.0000 mL | Freq: Two times a day (BID) | INTRAMUSCULAR | Status: DC

## 2012-06-02 MED ORDER — ONDANSETRON HCL 4 MG/2ML IJ SOLN: 4.0000 mg | Freq: Four times a day (QID) | INTRAMUSCULAR | Status: DC | PRN

## 2012-06-02 MED ORDER — ASPIRIN 81 MG PO CHEW
324.0000 mg | CHEWABLE_TABLET | ORAL | Status: AC
  Administered 2012-06-02: 324 mg via ORAL

## 2012-06-02 MED ORDER — CARVEDILOL 3.125 MG PO TABS: 3.1250 mg | ORAL_TABLET | Freq: Two times a day (BID) | ORAL | Status: DC

## 2012-06-02 MED ORDER — ACETAMINOPHEN 325 MG PO TABS: 650.0000 mg | ORAL_TABLET | ORAL | Status: DC | PRN

## 2012-06-02 MED ORDER — DIAZEPAM 5 MG PO TABS
5.0000 mg | ORAL_TABLET | ORAL | Status: AC
  Administered 2012-06-02: 5 mg via ORAL

## 2012-06-02 NOTE — Interval H&P Note (Signed)
History and Physical Interval Note:  06/02/2012 11:59 AM  [name] [name]  has presented today for surgery, with the diagnosis of CP  The various methods of treatment have been discussed with the patient and family. After consideration of risks, benefits and other options for treatment, the patient has consented to  Procedure(s) (LRB): JV LEFT HEART CATHETERIZATION WITH CORONARY ANGIOGRAM (N/A) as a surgical intervention .  The patients' history has been reviewed, patient examined, no change in status, stable for surgery.  I have reviewed the patients' chart and labs.  Questions were answered to the patient's satisfaction.     [name]

## 2012-06-02 NOTE — Telephone Encounter (Signed)
Dr. Armanda Magic called to say she had cathed [name] [name] today and found only small vessel coronary artery disease that would be treated medically.  He is cleared for proposed thoracic surgery.

## 2012-06-02 NOTE — H&P (View-Only) (Signed)
Tt/pre-cath work up.      HPI:     General:           [name] [name] is a 76 yo male recently seen by Dr Mayford Knife for cardiac clearance prior to lung surgery by Dr Tyrone Sage. He has a hx of hypermetabolic posterior left upper lobe lung nodule most consistent with primary bronchogenic carcinoma without evidence of extrathoracic hypermetabolic disease. EKG with NSR with frequent bigeminal PVC's as well as LVH. He does have a family history of sudden cardiac death with his son having Brugada's syndrome and was resuscitated. His mother had a cardiomyopathy.. Preop out pt testing with noted Nuclear stress test with dilated cardiomyopathy, ejection fraction 28%, + distal anterolateral wall ischemia. Echocardiogram with inferior wall hypokinesis, EF 30-35% and grade 2 diastolic dysfunction. Plan to proceed with cardiac catheterization on June 4,2013 with Dr Mayford Knife.. The patient denies any chest pain, SOB, DOE, LE edema, dizziness, palpitations or syncope. He states he was mowing the yard this week end without difficulty. However he is aware of increase fatigue over time.. .      ROS:      as noted in HPI, no allergies to IVP dye, no GI complaints, no black or bloody BMs, no fever, chills, + sinus congestion, and cough earlier this year has greatly improved, no wheezing, problems with poor sleep.     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      Surgical History: appendectomy , sebaceous cyst removal , prostate biopsy x4 , precancerous skin lesion , right nose Mohs surgery 2004, wide resection of metastatic anterior facial vein node and overlying skin and selective neck dissection [facility] hospital 2006, squamous cell carcinomas removed both hands , cataracts OU , squamous cell off hands and scalp 2011.      Family History:  Father: deceased 70 yrs Dementia Mother: deceased 51 yrs Lung cancer Sister 1: alive Diabetes mellitus Son(s): Psoriatic arthritis. Another son pars planitis, psoriasis. Sudden cardiac death resuscitated due to Brugada syndrome 1 sister(s) . 2 son(s) .      Social History:      General: History of smoking  cigarettes: Former smoker, Quit in year 1967, Pack-year Hx: 47.  Alcohol: 2-3 wine a week. Exercise: around house,, nothing structured. Occupation: Retired Chiropodist. Marital Status: Married.      Medications: Aspir-81 81 MG Tablet Delayed Release 1 tablet Once a day, B Complex Tablet as directed , Vitamin D 1000 UNIT Tablet 1 tablet Once a day, Tamsulosin HCl 0.4 MG Capsule 1 capsule 30 minutes after the same meal each day Once a day, Finasteride 5 MG Tablet 1 tablet Once a day, Triple Omega-3-6-9 Capsule as directed , Axiron 30 mg/1.5 ml (1 pump) Liquid (alcohol based Apply one pump has not used x 1 month, MVI as directed , OTC prostate complex, as directed , Medication List reviewed and reconciled with the patient     Allergies: Lipitor: myalgias.      Objective:    Vitals: Wt 160.8, Wt change -1 lb, Ht 63.25, BMI 28.26, Pulse sitting 76, BP sitting 124/72.  Examination:     Cardiology, General:         GENERAL APPEARANCE: pleasant, NAD.  HEENT: unremarkable.  CAROTID UPSTROKE: normal, no bruit.  JVD: flat.  HEART SOUNDS: regular, normal S1, S2, no S3 or S4.  MURMUR: absent.  LUNGS: no rales or wheezes.  ABDOMEN: soft, non tender, positive bowel sounds, .  EXTREMITIES: no leg edema.   PERIPHERAL PULSES: 2 plus bilateral.            Assessment:    Assessment:  1. Abnormal stress test - 794.39 (Primary)   2. Essential hypertension, benign - 401.1     Plan:    1. Abnormal stress test        LAB: Basic Metabolic     GLUCOSE 83 70-99 - mg/dL        BUN 17 8-41 - mg/dL        CREATININE 3.24 0.60-1.30 - mg/dl        eGFR (NON-AFRICAN AMERICAN) 74 >60 - calc        eGFR (AFRICAN AMERICAN) 89 >60 - calc        SODIUM 140 136-145 - mmol/L        POTASSIUM 3.9 3.5-5.5 - mmol/L        CHLORIDE 106 98-107 - mmol/L        C02 31 22-32 - mg/dL        ANION GAP 6.8 4.0-10.2 - mmol/L        CALCIUM 9.1 8.6-10.3 - mg/dL               [name] [name] A 05/29/2012 04:46:15 PM > ok for cath        LAB: CBC with Diff     WBC 6.3 4.0-11.0 - K/ul        RBC 4.77 4.20-5.80 - M/uL        HGB 14.3 13.0-17.0 - g/dL        HCT 72.5 36.6-44.0 - %        MCH 29.9 27.0-33.0 - pg        MPV 8.7 7.5-10.7 - fL        MCV 92.1 80.0-94.0 - fL        MCHC 32.5 32.0-36.0 - g/dL        RDW 34.7 42.5-95.6 - %        NRBC# 0.00 -        PLT 169 150-400 - K/uL         NEUT % 75.9 43.3-71.9 - % H       NRBC% 0.10 - %         LYMPH% 12.0 16.8-43.5 - % L       MONO % 10.0 4.6-12.4 - %        EOS % 1.6 0.0-7.8 - %        BASO % 0.5 0.0-1.0 - %        NEUT # 4.8 1.9-7.2 - K/uL         LYMPH# 0.80 1.10-2.70 - K/uL L       MONO # 0.6 0.3-0.8 - K/uL        EOS # 0.1 0.0-0.6 - K/uL        BASO # 0.0 0.0-0.1 - K/uL               [name] [name] A 05/29/2012 03:51:02 PM > ok for cath        LAB: PT and PTT ([id number])  aPTT 28 24-33 - SEC        INR 1.0 0.8-1.2 -        Prothrombin Time 10.6 9.1-12.0 - SEC               [name] [name] A 05/31/2012 05:02:15 PM > ok, for cath   With abnormal nuclear stress test and echocardiogram we will proceed with cardiac catheterization, Risks and benefits of cardiac catheterization have been reviewed including risk of stroke, heart attack, death,  bleeding, renal impariment and arterial damage. There was ample oppurtuny to answer questions. Alternatives were discussed. Patient understands and wishes to proceed.  Dr Anne Fu has discussed test results with pt, Dr Mayford Knife and Dr Tyrone Sage by phone last night, Dr Tyrone Sage will be out of town next week. Pt will proceed with cardiac cath with Dr Mayford Knife prior to further consideration for cardiac clearance for lung mass removal.       2. Essential hypertension, benign   stable.          Immunizations:       Labs:      Procedure Codes: 16109 ECL BMP, 85025 ECL CBC PLATELET DIFF, 60454 BLOOD COLLECTION ROUTINE VENIPUNCTURE     Preventive:           Follow Up: TT pending cath (Reason: cardiac cath.Marland Kitchen)        Provider: Michaell Cowing. Emelda Fear, NP  Patient: [name] [name], [name] [name]  DOB: 04-20-1936  Date: 05/29/2012

## 2012-06-02 NOTE — OR Nursing (Signed)
Discharge instructions reviewed and signed, pt stated understanding, ambulated in hall without difficulty, site level 0, transported to wife's car via wheelchair 

## 2012-06-02 NOTE — OR Nursing (Signed)
Tegaderm dressing applied, site level 0, bedrest begins at 1240

## 2012-06-02 NOTE — CV Procedure (Signed)
PROCEDURE:  Left heart catheterization with selective coronary angiography, left ventriculogram.  INDICATIONS:    The risks, benefits, and details of the procedure were explained to the patient.  The patient verbalized understanding and wanted to proceed.  Informed written consent was obtained.  PROCEDURE TECHNIQUE:  After Xylocaine anesthesia a 75F sheath was placed in the right femoral artery with a single anterior needle wall stick.   Left coronary angiography was done using a Judkins L4 guide catheter.  Right coronary angiography was done using a Judkins R4 guide catheter.  Left ventriculography was done using a pigtail catheter.    CONTRAST:  Total of  60cc.  COMPLICATIONS:  None.    HEMODYNAMICS:  Aortic pressure was 131/22mmHg; LV pressure was 122/61mmHg; LVEDP .  There was no gradient between the left ventricle and aorta.    ANGIOGRAPHIC DATA:   The left main coronary artery is widely patent and bifurcates into an LAD and left circumflex arteries.  The left anterior descending artery is widely patent in its proximal portion and gives rise to a first diagonal.  The diagonal has a 80-90% ostial stenosis before bifurcating into a superior and inferior branches.  The superior branch is very small.  The inferior branch bifurcates into a superior and inferior branch which are moderate in caliber and patent.  The LAD is patent after the first diagonal.   The left circumflex artery is widely patent and gives rise to a small to moderate OM1 which is patent.  It then gives rise to a moderate sized OM2 which is patent.  Distally it gives rise to an OM3 which is large and has a 70-80% stenosis in the proximal portion.  The left circumflex has a 90% stenosis and  tapers down to a very small caliber after the takeoff of the OM3.  The right coronary artery is widely patent with luminal irregularities and distally bifurcates into a PDA and PL brnaches which are patent.  LEFT VENTRICULOGRAM:  Left  ventricular angiogram was done in the 30 RAO projection and revealed normal left ventricular wall motion and systolic function with an estimated ejection fraction of 30%.  LVEDP was 15 mmHg.  IMPRESSIONS:  1. Normal left main coronary artery. 2. Normal left anterior descending artery with 80-90% ostial stenosis of a trifurcating diagonal 1. 3. Normal left circumflex artery in proximal and mid vessel with a 70-80% stenosis of the OM3 and 90% stenosis of the left circumflex after the OM3. 4. Normal right coronary artery with luminal irregularities. 5. Normal left ventricular systolic function.  LVEDP .  Ejection fraction 30%  RECOMMENDATION:   1.  Films reviewed with Dr. Eldridge Dace.  The diagonal is too small for PCI and therefore will be treated with nitrates.  I will start Imdur 30mg  daily.  He will also be continued on ASA 81mg  daily.  He has not had any chest pain or SOB with any type of activity so at this point I feel that the OM3 should be treated medically with nitrates. 2.  Start Coreg 3.125mg  BID and Altace 2.5mg  daily for his cardiomyopathy 3.  Followup with me in the office in 1 week

## 2012-06-02 NOTE — OR Nursing (Signed)
Dr Turner at bedside to discuss results and treatment plan with pt and family 

## 2012-06-02 NOTE — OR Nursing (Signed)
Meal served 

## 2012-06-10 ENCOUNTER — Other Ambulatory Visit: Payer: Self-pay

## 2012-06-10 DIAGNOSIS — D381 Neoplasm of uncertain behavior of trachea, bronchus and lung: Secondary | ICD-10-CM

## 2012-06-11 ENCOUNTER — Encounter (HOSPITAL_COMMUNITY): Payer: Self-pay | Admitting: Pharmacy Technician

## 2012-06-12 ENCOUNTER — Inpatient Hospital Stay (HOSPITAL_COMMUNITY): Admission: RE | Admit: 2012-06-12 | Discharge: 2012-06-12 | Payer: Medicare Other | Source: Ambulatory Visit

## 2012-06-12 ENCOUNTER — Encounter (HOSPITAL_COMMUNITY): Payer: Self-pay

## 2012-06-12 NOTE — Pre-Procedure Instructions (Signed)
20 [name] [name]  06/12/2012   Your procedure is scheduled on: 6.18.13  Report to [facility] at 530 AM.  Call this number if you have problems the morning of surgery: [phone]   Remember:   Do not eat food:After Midnight.   .  Take these medicines the morning of surgery with A SIP OF WATER: carvedilol, imdur, flonase, ? Aspirin as directed by dr   Drucilla Schmidt not wear jewelry, make-up or nail polish.  Do not wear lotions, powders, or perfumes. You may wear deodorant.  Do not shave 48 hours prior to surgery. Men may shave face and neck.  Do not bring valuables to the hospital.  Contacts, dentures or bridgework may not be worn into surgery.  Leave suitcase in the car. After surgery it may be brought to your room.  For patients admitted to the hospital, checkout time is 11:00 AM the day of discharge.   Patients discharged the day of surgery will not be allowed to drive home.  Name and phone number of your driver:   Special Instructions: CHG Shower Use Special Wash: 1/2 bottle night before surgery and 1/2 bottle morning of surgery.   Please read over the following fact sheets that you were given: Pain Booklet, Coughing and Deep Breathing, Blood Transfusion Information, MRSA Information and Surgical Site Infection Prevention

## 2012-06-15 ENCOUNTER — Encounter (HOSPITAL_COMMUNITY)
Admission: RE | Admit: 2012-06-15 | Discharge: 2012-06-15 | Disposition: A | Discharge status: Home / Self Care | Payer: Medicare Other | Source: Ambulatory Visit | Attending: Cardiothoracic Surgery | Admitting: Cardiothoracic Surgery

## 2012-06-15 ENCOUNTER — Ambulatory Visit (INDEPENDENT_AMBULATORY_CARE_PROVIDER_SITE_OTHER): Payer: Medicare Other | Admitting: Cardiothoracic Surgery

## 2012-06-15 ENCOUNTER — Ambulatory Visit: Payer: Medicare Other | Admitting: Cardiothoracic Surgery

## 2012-06-15 ENCOUNTER — Encounter: Payer: Self-pay | Admitting: Cardiothoracic Surgery

## 2012-06-15 ENCOUNTER — Inpatient Hospital Stay (HOSPITAL_COMMUNITY): Admission: RE | Admit: 2012-06-15 | Payer: Medicare Other | Source: Ambulatory Visit

## 2012-06-15 ENCOUNTER — Encounter (HOSPITAL_COMMUNITY): Payer: Self-pay

## 2012-06-15 ENCOUNTER — Ambulatory Visit (HOSPITAL_COMMUNITY)
Admission: RE | Admit: 2012-06-15 | Discharge: 2012-06-15 | Disposition: A | Discharge status: Home / Self Care | Payer: Medicare Other | Source: Ambulatory Visit | Attending: Cardiothoracic Surgery | Admitting: Cardiothoracic Surgery

## 2012-06-15 VITALS — BP 141/80 | HR 80 | Resp 18 | Ht 64.0 in | Wt 168.0 lb

## 2012-06-15 VITALS — BP 138/78 | HR 74 | Temp 97.8°F | Resp 18 | Ht 64.0 in | Wt 168.1 lb

## 2012-06-15 DIAGNOSIS — Z01812 Encounter for preprocedural laboratory examination: Secondary | ICD-10-CM | POA: Insufficient documentation

## 2012-06-15 DIAGNOSIS — Z01818 Encounter for other preprocedural examination: Secondary | ICD-10-CM | POA: Insufficient documentation

## 2012-06-15 DIAGNOSIS — R222 Localized swelling, mass and lump, trunk: Secondary | ICD-10-CM

## 2012-06-15 DIAGNOSIS — D381 Neoplasm of uncertain behavior of trachea, bronchus and lung: Secondary | ICD-10-CM

## 2012-06-15 DIAGNOSIS — Z01811 Encounter for preprocedural respiratory examination: Secondary | ICD-10-CM | POA: Insufficient documentation

## 2012-06-15 DIAGNOSIS — R918 Other nonspecific abnormal finding of lung field: Secondary | ICD-10-CM

## 2012-06-15 HISTORY — DX: Unspecified osteoarthritis, unspecified site: M19.90

## 2012-06-15 HISTORY — DX: Cardiac murmur, unspecified: R01.1

## 2012-06-15 HISTORY — DX: Trigger finger, unspecified finger: M65.30

## 2012-06-15 HISTORY — DX: Chronic kidney disease, unspecified: N18.9

## 2012-06-15 LAB — BLOOD GAS, ARTERIAL
Acid-Base Excess: 2.2 mmol/L — ABNORMAL HIGH (ref 0.0–2.0)
Bicarbonate: 26.1 mEq/L — ABNORMAL HIGH (ref 20.0–24.0)
Drawn by: 206361
FIO2: 0.21 %
O2 Saturation: 97.6 %
Patient temperature: 98.6
TCO2: 27.3 mmol/L (ref 0–100)
pCO2 arterial: 39.6 mmHg (ref 35.0–45.0)
pH, Arterial: 7.434 (ref 7.350–7.450)
pO2, Arterial: 79 mmHg — ABNORMAL LOW (ref 80.0–100.0)

## 2012-06-15 LAB — COMPREHENSIVE METABOLIC PANEL
ALT: 20 U/L (ref 0–53)
AST: 14 U/L (ref 0–37)
Albumin: 3.3 g/dL — ABNORMAL LOW (ref 3.5–5.2)
Alkaline Phosphatase: 68 U/L (ref 39–117)
BUN: 22 mg/dL (ref 6–23)
CO2: 23 mEq/L (ref 19–32)
Calcium: 9 mg/dL (ref 8.4–10.5)
Chloride: 107 mEq/L (ref 96–112)
Creatinine, Ser: 0.89 mg/dL (ref 0.50–1.35)
GFR calc Af Amer: >90 mL/min (ref >90)
GFR calc non Af Amer: 82 mL/min — ABNORMAL LOW (ref >90)
Glucose, Bld: 112 mg/dL — ABNORMAL HIGH (ref 70–99)
Potassium: 4.1 mEq/L (ref 3.5–5.1)
Sodium: 142 mEq/L (ref 135–145)
Total Bilirubin: 0.6 mg/dL (ref 0.3–1.2)
Total Protein: 6.4 g/dL (ref 6.0–8.3)

## 2012-06-15 LAB — CBC
HCT: 39.3 % (ref 39.0–52.0)
Hemoglobin: 13 g/dL (ref 13.0–17.0)
MCH: 29.9 pg (ref 26.0–34.0)
MCHC: 33.1 g/dL (ref 30.0–36.0)
MCV: 90.3 fL (ref 78.0–100.0)
Platelets: 162 10*3/uL (ref 150–400)
RBC: 4.35 MIL/uL (ref 4.22–5.81)
RDW: 13.3 % (ref 11.5–15.5)
WBC: 5.9 10*3/uL (ref 4.0–10.5)

## 2012-06-15 LAB — URINALYSIS, ROUTINE W REFLEX MICROSCOPIC
Bilirubin Urine: NEGATIVE
Glucose, UA: NEGATIVE mg/dL
Hgb urine dipstick: NEGATIVE
Ketones, ur: NEGATIVE mg/dL
Leukocytes, UA: NEGATIVE
Nitrite: NEGATIVE
Protein, ur: NEGATIVE mg/dL
Specific Gravity, Urine: 1.017 (ref 1.005–1.030)
Urobilinogen, UA: 0.2 mg/dL (ref 0.0–1.0)
pH: 7 (ref 5.0–8.0)

## 2012-06-15 LAB — APTT: aPTT: 27 seconds (ref 24–37)

## 2012-06-15 LAB — SURGICAL PCR SCREEN
MRSA, PCR: NEGATIVE
Staphylococcus aureus: POSITIVE — AB

## 2012-06-15 LAB — PROTIME-INR
INR: 1.01 (ref 0.00–1.49)
Prothrombin Time: 13.5 seconds (ref 11.6–15.2)

## 2012-06-15 LAB — ABO/RH: ABO/RH(D): O POS

## 2012-06-15 MED ORDER — DEXTROSE 5 % IV SOLN
1.5000 g | INTRAVENOUS | Status: AC
  Administered 2012-06-16: 1.5 g via INTRAVENOUS
  Filled 2012-06-15: qty 1.5

## 2012-06-15 NOTE — Patient Instructions (Addendum)
Lung Resection A lung resection is surgery to remove a lung. When an entire lung is removed, the procedure is called a pneumonectomy. When only part of a lung is removed, the procedure is called a lobectomy. A lung resection is typically done to get rid of a tumor or cancer. This surgery can help relieve some or all of your symptoms. The surgery can also help keep the problem from getting worse. It may provide the best chance for curing your disease. However, surgery may not necessarily cure lung cancer, if that is the problem. Most people need to stay in the hospital for several days after this procedure.   LET YOUR CAREGIVER KNOW ABOUT:  Allergies to food or medicine.   Medicines taken, including vitamins, herbs, eyedrops, over-the-counter medicines, and creams.   Use of steroids (by mouth or creams).   Previous problems with anesthetics or numbing medicines.   History of bleeding problems or blood clots.   Previous surgery.   Other health problems, including diabetes and kidney problems.   Possibility of pregnancy, if this applies.  RISKS AND COMPLICATIONS   Lung resections have been done for many years with good results and few complications. However, all surgery is associated with possible risks. Some of these risks are:  Excessive bleeding.   Infection.   Inability to breath without a ventilator.   Persistent shortness of breath.   Heart problems, including abnormal rhythms and a risk of heart attack or heart failure.   Blood clots.   Injury to a blood vessel.   Injury to a nerve.   Failure to heal properly.   Stroke.   Bronchopleural fistula. This is a small hole between one of the main breathing tubes and the lining of the lungs.  BEFORE THE PROCEDURE   In order to prepare for surgery, your caregiver may ask for several tests to be done. These may include:  Blood tests.   Urine tests.   X-rays.   Imaging tests, such as CT scans, MRI scans, and PET scans.  These tests are done to find the exact size and location of the tumor that will be removed.   Pulmonary function tests (PFTs). These are breathing tests to assess the function of your lungs before surgery and to decide how to best help your breathing after surgery.   Heart testing. This is done to make sure your heart is strong enough for the procedure.   Bronchoscopy. This is a technique that allows your caregiver to look at the inside of your airways. This is done using a soft, flexible tube (bronchoscope). Along with imaging tests, this can help your caregiver know the exact location and size of the area that will be removed during surgery.   Lymph node sampling. This may need to be done to see if the tumor has spread. It may be done as a separate surgery or right before your lung resection procedure.  PROCEDURE  An intravenous line (IV) will be placed in your arm. You will be given medicine that makes you sleep (general anesthetic).   Once you are asleep, a breathing tube is placed into your windpipe. You may also get pain medicine through a thin, flexible tube (catheter) in your back. The catheter is put through your skin and next to your spinal cord, where it releases anesthetic medicine.   Next, you will be turned onto your side. This makes it easier for your surgeon to reach the area of your ribcage where the surgical cut (  incision) will be made. This area is washed with a disinfectant solution and might also be shaved. A catheter will be put into your bladder to collect urine. Another tube will be carefully passed through your throat and into your stomach.   The surgeon will make an incision on your side, which will start between two of your ribs and go around to your back. Your ribs will be spread and held open. Part of one rib may be removed to make it easier for the surgeon to reach your lung.   Your surgeon will carefully cut the veins, arteries, and bronchus leading to the lung.  After being cut, each of these pieces will be sewn or stapled closed. Then, the lung or part of the lung will be removed.   Your surgeon will check inside your chest to make sure there is no bleeding in or around the lungs. Lymph nodes near the lung may also be removed for later tests. This is done to check if your problems have spread to the lymph nodes.   Depending on your situation, your surgeon may put tubes into your chest to drain extra fluid and air from the chest cavity after surgery. After the tubes are in, your ribcage will be closed with stitches. The stitches help your ribcage heal and keep it from moving. After this, the layers of tissue under the skin are closed with more stitches, which will dissolve inside your body over time. Finally, your skin is closed with stitches or staples and covered with a bandage.  AFTER THE PROCEDURE    After surgery, you will be taken to the recovery area where a nurse will monitor your progress. You may still have a breathing tube, spinal catheter, bladder catheter, stomach tube, and possibly chest tubes inside your body. These will be removed during your recovery. You may be put on a respirator following surgery if some assistance is needed to help your breathing. When you are awake, stable, and without complications, you will likely continue recovery in the intensive care unit (ICU).   As you wake up, you might feel some aches and pains in your chest and throat. Sometimes during recovery, patients may shiver or feel nauseous. Both of these symptoms are temporary and may be caused by the anesthesia. Your caregivers can give you medicine to help these problems go away.   The breathing tube will be taken out as soon as your caregivers feel you can breathe on your own. For most people, this happens on the same day as the surgery.   If your surgery and time in the ICU go well, most of the tubes and equipment will be taken out within the first 1 to 2 days after  surgery. This is about how long most people stay in the ICU. You may need to stay longer, depending on how you are doing.   You should also start respiratory therapy in the ICU. This therapy uses breathing exercises to help your other lung stay healthy and get stronger.   As you improve, you will be moved to a regular hospital room for continued respiratory therapy, help with your bladder and bowels, and to continue medicines. Most people stay in the hospital for 5 to 7 days. However, your stay may be longer, depending on how your surgery went and how well you are doing.   After your lung or part of your lung is taken out, there will be a space inside your chest. This space will   often fill up with fluid over time. The amount of time this takes is different for each person. Because your chest needs to fill with fluid, your surgeon may or may not put a drainage tube in your chest. If there is a chest tube, it will most likely be removed within 24 hours after the surgery.   You will receive care until you are doing well and your caregiver feels it is safe for you to go home or to transfer to an extended care facility.  Document Released: 03/08/2003 Document Revised: 12/05/2011 Document Reviewed: 08/15/2011 [employer] Patient Information 2012 [employer]  Lung Resection Care After Refer to this sheet in the next few weeks. These instructions provide you with information on caring for yourself after your procedure. Your caregiver may also give you more specific instructions. Your treatment has been planned according to current medical practices, but problems sometimes occur. Call your caregiver if you have any problems or questions after your procedure. HOME CARE INSTRUCTIONS  You may resume a normal diet and activities as directed.   Do not smoke or use tobacco products.   Change your bandages (dressings) as directed.   Only take over-the-counter or prescription medicines for pain, discomfort,  or fever as directed by your caregiver.   Keep all follow-up appointments as directed.   Try to breathe deeply and cough as directed. Holding a pillow firmly over your ribs may help with discomfort.   If you were given an incentive spirometer in the hospital, continue to use it as directed.   Walk as directed by your caregiver.   You may take a shower and gently wash the area of your surgical cut (incision) with water and soap as directed. Do not use anything else to clean your incision except as directed by your caregiver. Do not take baths or sit in a hot tub.  SEEK MEDICAL CARE IF:  You notice redness, swelling, or increasing pain in the incision.   You are bleeding from the incision.   You see pus coming from the incision.   You notice a bad smell coming from the incision or dressing.   Your incision breaks open.   You cough up blood or pus, or you develop a cough that produces bad smelling sputum.   You have pain or swelling in your legs.   You have increasing pain that is not controlled with medicine.   You have trouble managing any of the tubes that have been left in place after surgery.  SEEK IMMEDIATE MEDICAL CARE IF:    You have a fever or chills.   You have any reaction or side effects to medicines given.   You have chest pain or an irregular or rapid heartbeat.   You have dizzy episodes or fainting.   You have shortness of breath or difficulty breathing.   You have persistent nausea or vomiting.   You have a rash.  MAKE SURE YOU:  Understand these instructions.   Will watch your condition.   Will get help right away if you are not doing well or get worse.  Document Released: 07/05/2005 Document Revised: 12/05/2011 Document Reviewed: 08/15/2011 [employer] Patient Information 2012 [employer] 

## 2012-06-15 NOTE — Progress Notes (Signed)
[address]            [place] [address]          [phone]       [name] [name] [facility] Medical Record #[id number] Date of Birth: Dec 27, 1936  Referring: Quintella Reichert, MD Primary Care: Lillia Mountain, MD  Chief Complaint:    Chief Complaint  Patient presents with  . Lung Mass    f/u about surgery tomorrow 6/18    History of Present Illness:    Patient is a 76 year old male with good functional status who had noted some chronic cough over the past several months nonproductive and without hemoptysis recent visit to his dermatologists and discussion about the cough led to a chest x-ray and subsequently a CT scan and referral to Dr. Elnita Maxwell for a left lung mass. 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 Kathrynn Running in 2006. The patient has a family history of carcinoma of the lung he is a distant smoker quitting in 1967, been smoking for approximately 15 years. After evaluation with CT scan PET scan and PFTs he presents to the office to talk about surgical options for treatment of presumed carcinoma lung. He returns today after cardiology clear ence to answer further questions about surgery.      Current Activity/ Functional Status: Patient is independent with mobility/ambulation, transfers, ADL's, IADL's.   Past Medical History  Diagnosis Date  . Lung mass   . Squamous cell carcinoma, face     history of, right upper cheek in 2004   . Skin cancer     Multiple skin cancers   . BPH (benign prostatic hyperplasia)   . Shingles   . Heart murmur     as a child  . Chronic kidney disease     kidney stones -small passed.  . Arthritis   . Trigger finger     Bilateral    Past Surgical History  Procedure Date  . Mohs  surgery 2004  . Excision ot metastatic lymph node followed by radiation 2006    .  Marland Kitchen Cataract extraction, bilateral   . Appendectomy   . Cardiac catheterization 05/2012    Has blockage- Treated with Medications.  Cardiologist - Dr Mayford Knife    Family History  Problem Relation Age of Onset  . Lung cancer Father   . Psoriasis Child   . Arthritis Child   . Heart disease Mother    patient has one son who developed sudden cardiac arrest and was thought to have Brugada syndrome.     History  Smoking status  . Former Smoker  . Types: Cigarettes  . Quit date: 12/30/1965  Smokeless tobacco  . Never Used    History  Alcohol Use  . Yes    infrequently- "indulge heavly when I do"     Allergies  Allergen Reactions  . Lipitor (Atorvastatin) Other (See Comments)    Muscle pain    Current Outpatient Prescriptions  Medication Sig Dispense Refill  . aspirin 81 MG tablet Take 81 mg by mouth daily.      Marland Kitchen  B Complex Vitamins (B COMPLEX 1) tablet Take 1 tablet by mouth daily.      . carvedilol (COREG) 3.125 MG tablet Take 1 tablet (3.125 mg total) by mouth 2 (two) times daily with a meal.  60 tablet  11  . Cholecalciferol (VITAMIN D3) 1000 UNITS CAPS Take 1,000 Units by mouth daily.      . finasteride (PROSCAR) 5 MG tablet Take 5 mg by mouth Daily.      . isosorbide mononitrate (IMDUR) 30 MG 24 hr tablet Take 1 tablet (30 mg total) by mouth daily.  30 tablet  11  . Multiple Vitamins-Minerals (CENTRUM PO) Take 1 tablet by mouth daily.      Ailene Ards 3-6-9 Fatty Acids (TRIPLE OMEGA COMPLEX PO) Take 1 capsule by mouth daily.      . ramipril (ALTACE) 2.5 MG capsule Take 1 capsule (2.5 mg total) by mouth daily.  30 capsule  11  . Tamsulosin HCl (FLOMAX) 0.4 MG CAPS Take 0.4 mg by mouth Daily.       No current facility-administered medications for this visit.   Facility-Administered Medications Ordered in Other Visits  Medication Dose Route Frequency Provider Last Rate Last Dose  . cefUROXime  (ZINACEF) 1.5 g in dextrose 5 % 50 mL IVPB  1.5 g Intravenous 60 min Pre-Op Delight Ovens, MD           Review of Systems:     Cardiac Review of Systems: Y or N  Chest Pain [ n   ]  Resting SOB [n   ] Exertional SOB  [ n ]  Myer Peer  ]   Pedal Edema [ n  ]    Palpitations [n  ] Syncope  [n  ]   Presyncope [ n  ]  General Review of Systems: [Y] = yes [  ]=no Constitional: recent weight change Cove.Etienne  ]; anorexia [  ]; fatigue [  ]; nausea [  ]; night sweats [  ]; fever [ n ]; or chills [ n ];                                                                                                                                          Dental: poor dentition[ n ]; Last Dentist visit:partial plates  Eye : blurred vision [  ]; diplopia [   ]; vision changes [  ];  Amaurosis fugax[  ]; Resp: cough [  ];  wheezing[  ];  hemoptysis[  ]; shortness of breath[  ]; paroxysmal nocturnal dyspnea[  ]; dyspnea on exertion[  ]; or orthopnea[  ];  GI:  gallstones[  ], vomiting[  ];  dysphagia[  ]; melena[  ];  hematochezia [  ]; heartburn[  ];   Hx of  Colonoscopy[  ]; GU: kidney stones [  ]; hematuria[n  ];   dysuria [  ];  nocturia[ 1 time  ];  history of     obstruction [n  ];             Skin: rash, swelling[  ];, hair loss[  ];  peripheral edema[  ];  or itching[  ]; Musculosketetal: myalgias[  ];  joint swelling[mild  ];  joint erythema[  ];  joint pain[  ];  back pain[  ];  Heme/Lymph: bruising[  ];  bleeding[  ];  anemia[  ];  Neuro: TIA[  ];  headaches[  ];  stroke[  ];  vertigo[  ];  seizures[  ];   paresthesias[  ];  difficulty walking[  ];  Psych:depression[  ]; anxiety[  ];  Endocrine: diabetes[  ];  thyroid dysfunction[  ];  Immunizations: Flu Cove.Etienne  ]; Pneumococcal[y  ];  Other:  Physical Exam: BP 141/80  Pulse 80  Resp 18  Ht 5\' 4"  (1.626 m)  Wt 168 lb (76.204 kg)  BMI 28.84 kg/m2  SpO2 97%  General appearance: alert, cooperative and appears stated age Neurologic: intact Heart: regular  rate and rhythm, S1, S2 normal, no murmur, click, rub or gallop and normal apical impulse Lungs: clear to auscultation bilaterally and normal percussion bilaterally Abdomen: soft, non-tender; bowel sounds normal; no masses,  no organomegaly Extremities: extremities normal, atraumatic, no cyanosis or edema and Homans sign is negative, no sign of DVT The patient has no cervical or supraclavicular adenopathy he does have some erythematous skin changes in the right neck which she relates to his previous radiation treatment Patient has no carotid bruits, femoral pulses popliteal DP and PT pulses are all full and intact.   Diagnostic Studies & Laboratory data:     Recent Radiology Findings:  Ct Chest W Contrast  05/05/2012  *RADIOLOGY REPORT*  Clinical Data: History of squamous cell carcinoma of the right side the face.  Question left hilar adenopathy.  CT CHEST WITH CONTRAST  Technique:  Multidetector CT imaging of the chest was performed following the standard protocol during bolus administration of intravenous contrast.  Contrast: 80mL OMNIPAQUE IOHEXOL 300 MG/ML  SOLN  Comparison: Plain film of 02/26/2012.  Most recent chest CT of 10/29/2005.  Findings: Lung windows demonstrate mild right apical scarring. Posterior left upper lobe lung nodule, corresponding to the plain film abnormality.  This measures 2.4 x 2.6 cm on transverse image 28 and 2.8 cm on sagittal image 67.  This contacts the left major fissure, and extension into the superior segment left lower lobe cannot be excluded (also sagittal image 67). The lesion is positioned immediately adjacent to the descending aorta, including on image 28 of series 2.  Contacts the left upper lobe bronchus.  Soft tissue windows demonstrate no supraclavicular adenopathy. Heart size upper normal. Multivessel coronary artery atherosclerosis.  No pericardial or pleural effusion.  Small middle mediastinal nodes, without adenopathy.  A left paratracheal node measures 7  mm on image 26 versus 5 mm on the prior.  No hilar adenopathy.  Tiny hiatal hernia  Limited abdominal imaging demonstrates normal adrenal glands.  No significant findings.  Similar bone island within the upper thoracic spine on image 16.  IMPRESSION:  Plain film abnormality corresponds to a posterior left upper lobe lung nodule, consistent with primary bronchogenic carcinoma. Although this contacts the left major fissure and is immediately adjacent the aorta, there is no evidence of nodal metastasis.  By imaging, presuming non-small cell histology, T1bN0M0 or stage IIA  These results will be called to the ordering  clinician or representative by the Radiologist Assistant, and communication documented in the PACS Dashboard.  Original Report Authenticated By: Consuello Bossier, M.D.   Nm Pet Image Initial (pi) Skull Base To Thigh  05/18/2012  *RADIOLOGY REPORT*  Clinical Data: Initial treatment strategy for posterior left upper lobe lung nodule suspicious for primary bronchogenic carcinoma.  NUCLEAR MEDICINE PET CT SKULL BASE TO THIGH  Technique:  Technique:  14.6 mCi F-18 FDG was injected intravenously. CT data was obtained and used for attenuation correction and anatomic localization only.  (This was not acquired as a diagnostic CT examination.) Additional exam technical data entered on technologist worksheet.  Comparison: Chest CT 05/05/2012.  Prior PET of 04/29/2005.  Findings: Neck: No abnormal hypermetabolism.  Chest:  Hypermetabolism corresponding to the previously described posterior left upper lobe lung nodule.  This measures 2.7 cm and a S.U.V. max of 15.4 on image 78.  No abnormal thoracic nodal activity.  Abdomen/Pelvis:  Likely physiologic hypermetabolism about the right side of the anus, no CT correlate.  [place]:  No hypermetabolism to suggest osseous metastasis.  CT images performed for attenuation correction demonstrate surgical changes within the right side of the neck.  Right-sided thyroid atrophy.   Chest findings deferred to recent diagnostic chest CT.  Coronary artery atherosclerosis. No acute superimposed process.  Normal adrenal glands.  Left greater than right renal collecting systems stones.  Right common iliac artery aneurysm, 1.6 cm on image 176.  Mild prostatomegaly.  Probable bone island in the sacrum.  Scattered sclerotic lesions within the thoracic spine are also likely bone islands.  IMPRESSION:  1.  Hypermetabolic posterior left upper lobe lung nodule, most consistent with primary bronchogenic carcinoma.  No evidence of thoracic nodal or extrathoracic hypermetabolic disease.  Presuming non-small cell histology, T1bN0M0 or stage IIA disease. 2.  Surgical changes in the right side of the neck, without hypermetabolic residual or recurrent disease. 3.  Right common iliac artery aneurysm. 4.  Renal calculi.  Original Report Authenticated By: Consuello Bossier, M.D.   Cath Results: Filed:  06/02/12 1240  Note Time:  06/02/12 1159          PROCEDURE: Left heart catheterization with selective coronary angiography, left ventriculogram.  INDICATIONS:  The risks, benefits, and details of the procedure were explained to the patient. The patient verbalized understanding and wanted to proceed. Informed written consent was obtained.  PROCEDURE TECHNIQUE: After Xylocaine anesthesia a 10F sheath was placed in the right femoral artery with a single anterior needle wall stick. Left coronary angiography was done using a Judkins L4 guide catheter. Right coronary angiography was done using a Judkins R4 guide catheter. Left ventriculography was done using a pigtail catheter.  CONTRAST: Total of 60cc.  COMPLICATIONS: None.  HEMODYNAMICS: Aortic pressure was 131/35mmHg; LV pressure was 122/69mmHg; LVEDP . There was no gradient between the left ventricle and aorta.  ANGIOGRAPHIC DATA: The left main coronary artery is widely patent and bifurcates into an LAD and left circumflex arteries.  The left anterior  descending artery is widely patent in its proximal portion and gives rise to a first diagonal. The diagonal has a 80-90% ostial stenosis before bifurcating into a superior and inferior branches. The superior branch is very small. The inferior branch bifurcates into a superior and inferior branch which are moderate in caliber and patent. The LAD is patent after the first diagonal.  The left circumflex artery is widely patent and gives rise to a small to moderate OM1 which is patent. It then  gives rise to a moderate sized OM2 which is patent. Distally it gives rise to an OM3 which is large and has a 70-80% stenosis in the proximal portion. The left circumflex has a 90% stenosis and tapers down to a very small caliber after the takeoff of the OM3.  The right coronary artery is widely patent with luminal irregularities and distally bifurcates into a PDA and PL brnaches which are patent.  LEFT VENTRICULOGRAM: Left ventricular angiogram was done in the 30 RAO projection and revealed normal left ventricular wall motion and systolic function with an estimated ejection fraction of 30%. LVEDP was 15 mmHg.  IMPRESSIONS:  1. Normal left main coronary artery. 2. Normal left anterior descending artery with 80-90% ostial stenosis of a trifurcating diagonal 1. 3. Normal left circumflex artery in proximal and mid vessel with a 70-80% stenosis of the OM3 and 90% stenosis of the left circumflex after the OM3. 4. Normal right coronary artery with luminal irregularities. 5. Normal left ventricular systolic function. LVEDP . Ejection fraction 30% RECOMMENDATION:  1. Films reviewed with Dr. Eldridge Dace. The diagonal is too small for PCI and therefore will be treated with nitrates. I will start Imdur 30mg  daily. He will also be continued on ASA 81mg  daily. He has not had any chest pain or SOB with any type of activity so at this point I feel that the OM3 should be treated medically with nitrates.  2. Start Coreg 3.125mg  BID  and Altace 2.5mg  daily for his cardiomyopathy  3. Followup with me in the office in 1 week        PFT'S:  FVC 3.26, FEV1 2.62, DLCO 77%     Recent Lab Findings: Lab Results  Component Value Date   WBC 5.9 06/15/2012   HGB 13.0 06/15/2012   HCT 39.3 06/15/2012   PLT 162 06/15/2012   GLUCOSE 112* 06/15/2012   ALT 20 06/15/2012   AST 14 06/15/2012   NA 142 06/15/2012   K 4.1 06/15/2012   CL 107 06/15/2012   CREATININE 0.89 06/15/2012   BUN 22 06/15/2012   CO2 23 06/15/2012   TSH 5.698* 11/30/2007   INR 1.01 06/15/2012   EKG: 5/13/20013 Sinus rhythm with frequent Premature ventricular complexes in a pattern of bigeminy Possible Left atrial enlargement Left ventricular hypertrophy Nonspecific T wave abnormality Prolonged QT Abnormal ECG Since last tracing vent. bigeminy is new. 71mm/s 95mm/mV 100Hz  8.0.1 12SL 237 CID: [id number]  Assessment / Plan:    Patient is a 76 year old male with previous history of squamous cell carcinoma involving the right cheek in 2006 now presents with a PET avid 2.7 cm mass involving the left upper lobe centrally, from a clinical and radiographic standpoint this appears to be a T1bN0M0 or stage IIA carcinoma of the lung, but we have no tissue confirmation of this. The patient has good functional status and pulmonary function studies. He has had with preoperative clearance with cardiology Dr Mayford Knife including cath last week because of his abnormal EKG, and family history of Brugada syndrome. We discussed preoperative attempts at obtaining a tissue diagnosis, however I have told him I would still recommend surgical resection of the mass even if the needle biopsy was negative because of its suspicious nature, new onset, PET avid. It appears to be a new primary lung cancer, there is no evidence that it is metastatic spread from his previous malignancy.  The risks and options of surgery for the treatment of stage I lung cancer is discussed with the patient  and his wife in  detail, including the risks. Although the goal of surgery would be lobectomy, the position of the tumor on the pulmonary artery could necessitate a pneumonectomy.   The goals risks and alternatives of the planned surgical procedure bronchoscopy left video-assisted thoracoscopy left upper lobectomy have been discussed with the patient in detail. The risks of the procedure including death, infection, stroke, myocardial infarction, bleeding, blood transfusion have all been discussed specifically.  I have quoted [name] a  3 % of perioperative mortality and a complication rate as high as 15 %. The patient's questions have been answered.[name] is willing  to proceed with the planned procedure.     Delight Ovens MD  Beeper [phone] Office [phone] 06/15/2012 3:41 PM

## 2012-06-16 ENCOUNTER — Encounter (HOSPITAL_COMMUNITY)
Admission: RE | Disposition: A | Discharge status: Home / Self Care | Payer: Self-pay | Source: Ambulatory Visit | Attending: Cardiothoracic Surgery

## 2012-06-16 ENCOUNTER — Inpatient Hospital Stay (HOSPITAL_COMMUNITY)
Admission: RE | Admit: 2012-06-16 | Discharge: 2012-06-29 | DRG: 164 | Disposition: A | Discharge status: Home / Self Care | Payer: Medicare Other | Source: Ambulatory Visit | Attending: Cardiothoracic Surgery | Admitting: Cardiothoracic Surgery

## 2012-06-16 ENCOUNTER — Ambulatory Visit (HOSPITAL_COMMUNITY): Payer: Medicare Other | Admitting: Certified Registered"

## 2012-06-16 ENCOUNTER — Encounter (HOSPITAL_COMMUNITY): Payer: Self-pay | Admitting: *Deleted

## 2012-06-16 ENCOUNTER — Encounter (HOSPITAL_COMMUNITY): Payer: Self-pay | Admitting: Certified Registered"

## 2012-06-16 ENCOUNTER — Inpatient Hospital Stay (HOSPITAL_COMMUNITY): Payer: Medicare Other

## 2012-06-16 DIAGNOSIS — Z87891 Personal history of nicotine dependence: Secondary | ICD-10-CM

## 2012-06-16 DIAGNOSIS — Z801 Family history of malignant neoplasm of trachea, bronchus and lung: Secondary | ICD-10-CM

## 2012-06-16 DIAGNOSIS — D62 Acute posthemorrhagic anemia: Secondary | ICD-10-CM | POA: Diagnosis not present

## 2012-06-16 DIAGNOSIS — J9819 Other pulmonary collapse: Secondary | ICD-10-CM | POA: Diagnosis not present

## 2012-06-16 DIAGNOSIS — C3410 Malignant neoplasm of upper lobe, unspecified bronchus or lung: Principal | ICD-10-CM | POA: Diagnosis present

## 2012-06-16 DIAGNOSIS — K5900 Constipation, unspecified: Secondary | ICD-10-CM | POA: Diagnosis not present

## 2012-06-16 DIAGNOSIS — D381 Neoplasm of uncertain behavior of trachea, bronchus and lung: Secondary | ICD-10-CM

## 2012-06-16 DIAGNOSIS — J9382 Other air leak: Secondary | ICD-10-CM | POA: Diagnosis not present

## 2012-06-16 DIAGNOSIS — C44300 Unspecified malignant neoplasm of skin of unspecified part of face: Secondary | ICD-10-CM

## 2012-06-16 DIAGNOSIS — B372 Candidiasis of skin and nail: Secondary | ICD-10-CM | POA: Diagnosis not present

## 2012-06-16 DIAGNOSIS — J90 Pleural effusion, not elsewhere classified: Secondary | ICD-10-CM | POA: Diagnosis not present

## 2012-06-16 DIAGNOSIS — Z85828 Personal history of other malignant neoplasm of skin: Secondary | ICD-10-CM

## 2012-06-16 DIAGNOSIS — N400 Enlarged prostate without lower urinary tract symptoms: Secondary | ICD-10-CM | POA: Diagnosis present

## 2012-06-16 DIAGNOSIS — N189 Chronic kidney disease, unspecified: Secondary | ICD-10-CM | POA: Diagnosis present

## 2012-06-16 DIAGNOSIS — D72829 Elevated white blood cell count, unspecified: Secondary | ICD-10-CM | POA: Diagnosis not present

## 2012-06-16 DIAGNOSIS — I428 Other cardiomyopathies: Secondary | ICD-10-CM | POA: Diagnosis present

## 2012-06-16 DIAGNOSIS — E8779 Other fluid overload: Secondary | ICD-10-CM | POA: Diagnosis not present

## 2012-06-16 DIAGNOSIS — I2510 Atherosclerotic heart disease of native coronary artery without angina pectoris: Secondary | ICD-10-CM | POA: Diagnosis present

## 2012-06-16 HISTORY — PX: VIDEO BRONCHOSCOPY: SHX5072

## 2012-06-16 HISTORY — PX: LYMPH NODE DISSECTION: SHX5087

## 2012-06-16 LAB — POCT I-STAT 7, (LYTES, BLD GAS, ICA,H+H)
Acid-base deficit: 5 mmol/L — ABNORMAL HIGH (ref 0.0–2.0)
Bicarbonate: 21.2 mEq/L (ref 20.0–24.0)
Calcium, Ion: 1 mmol/L — ABNORMAL LOW (ref 1.12–1.32)
HCT: 27 % — ABNORMAL LOW (ref 39.0–52.0)
Hemoglobin: 9.2 g/dL — ABNORMAL LOW (ref 13.0–17.0)
O2 Saturation: 100 %
Patient temperature: 35.8
Potassium: 3.6 mEq/L (ref 3.5–5.1)
Sodium: 143 mEq/L (ref 135–145)
TCO2: 23 mmol/L (ref 0–100)
pCO2 arterial: 42 mmHg (ref 35.0–45.0)
pH, Arterial: 7.306 — ABNORMAL LOW (ref 7.350–7.450)
pO2, Arterial: 215 mmHg — ABNORMAL HIGH (ref 80.0–100.0)

## 2012-06-16 SURGERY — LOBECTOMY, LUNG, OPEN
Anesthesia: General | Site: Chest | Wound class: Clean Contaminated

## 2012-06-16 MED ORDER — DIPHENHYDRAMINE HCL 50 MG/ML IJ SOLN: 12.5000 mg | Freq: Four times a day (QID) | INTRAMUSCULAR | Status: DC | PRN

## 2012-06-16 MED ORDER — NEOSTIGMINE METHYLSULFATE 1 MG/ML IJ SOLN
INTRAMUSCULAR | Status: DC | PRN
  Administered 2012-06-16: 4 mg via INTRAVENOUS

## 2012-06-16 MED ORDER — NITROGLYCERIN IN D5W 200-5 MCG/ML-% IV SOLN
16.6700 ug/min | INTRAVENOUS | Status: DC
  Administered 2012-06-16: 16.67 ug/min via INTRAVENOUS

## 2012-06-16 MED ORDER — TAMSULOSIN HCL 0.4 MG PO CAPS
0.4000 mg | ORAL_CAPSULE | Freq: Every day | ORAL | Status: DC
  Administered 2012-06-17 – 2012-06-29 (×13): 0.4 mg via ORAL
  Filled 2012-06-16 (×13): qty 1

## 2012-06-16 MED ORDER — FINASTERIDE 5 MG PO TABS
5.0000 mg | ORAL_TABLET | Freq: Every day | ORAL | Status: DC
  Administered 2012-06-16 – 2012-06-29 (×14): 5 mg via ORAL
  Filled 2012-06-16 (×14): qty 1

## 2012-06-16 MED ORDER — DIPHENHYDRAMINE HCL 12.5 MG/5ML PO ELIX
12.5000 mg | ORAL_SOLUTION | Freq: Four times a day (QID) | ORAL | Status: DC | PRN
  Filled 2012-06-16: qty 5

## 2012-06-16 MED ORDER — OXYCODONE-ACETAMINOPHEN 5-325 MG PO TABS
1.0000 | ORAL_TABLET | ORAL | Status: DC | PRN
  Administered 2012-06-18 – 2012-06-19 (×2): 2 via ORAL
  Administered 2012-06-19: 1 via ORAL
  Administered 2012-06-19 – 2012-06-23 (×15): 2 via ORAL
  Administered 2012-06-23 – 2012-06-29 (×11): 1 via ORAL
  Filled 2012-06-16: qty 2
  Filled 2012-06-16: qty 1
  Filled 2012-06-16 (×5): qty 2
  Filled 2012-06-16 (×2): qty 1
  Filled 2012-06-16 (×2): qty 2
  Filled 2012-06-16 (×3): qty 1
  Filled 2012-06-16 (×4): qty 2
  Filled 2012-06-16 (×4): qty 1
  Filled 2012-06-16: qty 2
  Filled 2012-06-16 (×2): qty 1
  Filled 2012-06-16 (×2): qty 2
  Filled 2012-06-16: qty 1
  Filled 2012-06-16 (×2): qty 2
  Filled 2012-06-16: qty 1

## 2012-06-16 MED ORDER — HYDROMORPHONE HCL PF 1 MG/ML IJ SOLN
INTRAMUSCULAR | Status: AC
  Filled 2012-06-16: qty 1

## 2012-06-16 MED ORDER — ALBUMIN HUMAN 25 % IV SOLN
INTRAVENOUS | Status: DC | PRN
  Administered 2012-06-16: 12:00:00 via INTRAVENOUS

## 2012-06-16 MED ORDER — 0.9 % SODIUM CHLORIDE (POUR BTL) OPTIME
TOPICAL | Status: DC | PRN
  Administered 2012-06-16: 3000 mL

## 2012-06-16 MED ORDER — OXYCODONE-ACETAMINOPHEN 5-325 MG PO TABS
1.0000 | ORAL_TABLET | ORAL | Status: DC | PRN
  Administered 2012-06-21: 2 via ORAL
  Administered 2012-06-22: 1 via ORAL
  Administered 2012-06-24: 2 via ORAL
  Administered 2012-06-26 – 2012-06-27 (×3): 1 via ORAL
  Filled 2012-06-16 (×3): qty 2
  Filled 2012-06-16: qty 1
  Filled 2012-06-16 (×3): qty 2

## 2012-06-16 MED ORDER — SENNOSIDES-DOCUSATE SODIUM 8.6-50 MG PO TABS
1.0000 | ORAL_TABLET | Freq: Every evening | ORAL | Status: DC | PRN
  Administered 2012-06-23: 1 via ORAL
  Filled 2012-06-16 (×2): qty 1

## 2012-06-16 MED ORDER — MIDAZOLAM HCL 5 MG/5ML IJ SOLN
INTRAMUSCULAR | Status: DC | PRN
  Administered 2012-06-16: 2 mg via INTRAVENOUS

## 2012-06-16 MED ORDER — NALOXONE HCL 0.4 MG/ML IJ SOLN: 0.4000 mg | INTRAMUSCULAR | Status: DC | PRN

## 2012-06-16 MED ORDER — SODIUM CHLORIDE 0.9 % IJ SOLN: 9.0000 mL | INTRAMUSCULAR | Status: DC | PRN

## 2012-06-16 MED ORDER — POTASSIUM CHLORIDE 10 MEQ/50ML IV SOLN
10.0000 meq | Freq: Every day | INTRAVENOUS | Status: DC | PRN
  Administered 2012-06-17 (×3): 10 meq via INTRAVENOUS
  Filled 2012-06-16: qty 100
  Filled 2012-06-16: qty 50

## 2012-06-16 MED ORDER — ONDANSETRON HCL 4 MG/2ML IJ SOLN: 4.0000 mg | Freq: Four times a day (QID) | INTRAMUSCULAR | Status: DC | PRN

## 2012-06-16 MED ORDER — ACETAMINOPHEN 10 MG/ML IV SOLN
1000.0000 mg | Freq: Four times a day (QID) | INTRAVENOUS | Status: AC
  Administered 2012-06-16 – 2012-06-17 (×4): 1000 mg via INTRAVENOUS
  Filled 2012-06-16 (×5): qty 100

## 2012-06-16 MED ORDER — FENTANYL 10 MCG/ML IV SOLN
INTRAVENOUS | Status: DC
  Administered 2012-06-16: 20 ug via INTRAVENOUS
  Administered 2012-06-16: 15:00:00 via INTRAVENOUS
  Administered 2012-06-16: 80 ug via INTRAVENOUS
  Administered 2012-06-17: 120 ug via INTRAVENOUS
  Administered 2012-06-17: 70 ug via INTRAVENOUS
  Administered 2012-06-17: 30 ug via INTRAVENOUS
  Administered 2012-06-17: 10 ug via INTRAVENOUS
  Administered 2012-06-18: 30 ug via INTRAVENOUS
  Administered 2012-06-18: 20 ug via INTRAVENOUS
  Administered 2012-06-18: 30 ug via INTRAVENOUS
  Administered 2012-06-18: 60 ug via INTRAVENOUS
  Administered 2012-06-19: 50 ug via INTRAVENOUS
  Administered 2012-06-19: 80 ug via INTRAVENOUS
  Administered 2012-06-19: 40 ug via INTRAVENOUS
  Filled 2012-06-16 (×6): qty 50

## 2012-06-16 MED ORDER — FENTANYL CITRATE 0.05 MG/ML IJ SOLN
INTRAMUSCULAR | Status: DC | PRN
  Administered 2012-06-16 (×4): 50 ug via INTRAVENOUS
  Administered 2012-06-16: 100 ug via INTRAVENOUS

## 2012-06-16 MED ORDER — MUPIROCIN 2 % EX OINT
TOPICAL_OINTMENT | CUTANEOUS | Status: AC
  Administered 2012-06-16: 1 via NASAL
  Filled 2012-06-16: qty 22

## 2012-06-16 MED ORDER — BISACODYL 5 MG PO TBEC
10.0000 mg | DELAYED_RELEASE_TABLET | Freq: Every day | ORAL | Status: DC
  Administered 2012-06-17 – 2012-06-29 (×13): 10 mg via ORAL
  Filled 2012-06-16 (×13): qty 2

## 2012-06-16 MED ORDER — ONDANSETRON HCL 4 MG/2ML IJ SOLN: 4.0000 mg | Freq: Once | INTRAMUSCULAR | Status: DC | PRN

## 2012-06-16 MED ORDER — ACETAMINOPHEN 10 MG/ML IV SOLN
INTRAVENOUS | Status: AC
  Filled 2012-06-16: qty 100

## 2012-06-16 MED ORDER — ASPIRIN EC 81 MG PO TBEC
81.0000 mg | DELAYED_RELEASE_TABLET | Freq: Every day | ORAL | Status: DC
  Administered 2012-06-17 – 2012-06-29 (×13): 81 mg via ORAL
  Filled 2012-06-16 (×13): qty 1

## 2012-06-16 MED ORDER — BUPIVACAINE 0.5 % ON-Q PUMP SINGLE CATH 400 ML
400.0000 mL | INJECTION | Status: AC
  Administered 2012-06-16: 400 mL
  Filled 2012-06-16: qty 400

## 2012-06-16 MED ORDER — PHENYLEPHRINE HCL 10 MG/ML IJ SOLN
INTRAMUSCULAR | Status: DC | PRN
  Administered 2012-06-16: 80 ug via INTRAVENOUS
  Administered 2012-06-16 (×2): 40 ug via INTRAVENOUS
  Administered 2012-06-16: 80 ug via INTRAVENOUS
  Administered 2012-06-16 (×5): 40 ug via INTRAVENOUS

## 2012-06-16 MED ORDER — CARVEDILOL 3.125 MG PO TABS
3.1250 mg | ORAL_TABLET | Freq: Two times a day (BID) | ORAL | Status: DC
  Administered 2012-06-17 – 2012-06-20 (×6): 3.125 mg via ORAL
  Filled 2012-06-16 (×9): qty 1

## 2012-06-16 MED ORDER — ROCURONIUM BROMIDE 100 MG/10ML IV SOLN
INTRAVENOUS | Status: DC | PRN
  Administered 2012-06-16: 10 mg via INTRAVENOUS
  Administered 2012-06-16: 50 mg via INTRAVENOUS
  Administered 2012-06-16: 10 mg via INTRAVENOUS
  Administered 2012-06-16: 20 mg via INTRAVENOUS
  Administered 2012-06-16: 10 mg via INTRAVENOUS
  Administered 2012-06-16: 20 mg via INTRAVENOUS
  Administered 2012-06-16 (×2): 10 mg via INTRAVENOUS

## 2012-06-16 MED ORDER — SODIUM BICARBONATE 4.2 % IV SOLN
INTRAVENOUS | Status: DC | PRN
  Administered 2012-06-16: 50 meq via INTRAVENOUS

## 2012-06-16 MED ORDER — OXYCODONE HCL 5 MG PO TABS
5.0000 mg | ORAL_TABLET | ORAL | Status: AC | PRN
  Administered 2012-06-17: 10 mg via ORAL
  Filled 2012-06-16: qty 2

## 2012-06-16 MED ORDER — GLYCOPYRROLATE 0.2 MG/ML IJ SOLN
INTRAMUSCULAR | Status: DC | PRN
  Administered 2012-06-16: .6 mg via INTRAVENOUS

## 2012-06-16 MED ORDER — LACTATED RINGERS IV SOLN
INTRAVENOUS | Status: DC | PRN
  Administered 2012-06-16 (×4): via INTRAVENOUS

## 2012-06-16 MED ORDER — TRAMADOL HCL 50 MG PO TABS
50.0000 mg | ORAL_TABLET | Freq: Four times a day (QID) | ORAL | Status: DC | PRN
  Administered 2012-06-18 – 2012-06-19 (×3): 100 mg via ORAL
  Administered 2012-06-19: 50 mg via ORAL
  Administered 2012-06-20 – 2012-06-29 (×9): 100 mg via ORAL
  Filled 2012-06-16 (×12): qty 2
  Filled 2012-06-16: qty 1
  Filled 2012-06-16: qty 2

## 2012-06-16 MED ORDER — MUPIROCIN 2 % EX OINT
1.0000 "application " | TOPICAL_OINTMENT | Freq: Two times a day (BID) | CUTANEOUS | Status: DC
  Administered 2012-06-16 – 2012-06-18 (×4): 1 via NASAL
  Filled 2012-06-16: qty 22

## 2012-06-16 MED ORDER — ONDANSETRON HCL 4 MG/2ML IJ SOLN
INTRAMUSCULAR | Status: DC | PRN
  Administered 2012-06-16: 4 mg via INTRAVENOUS

## 2012-06-16 MED ORDER — CHLORHEXIDINE GLUCONATE CLOTH 2 % EX PADS
6.0000 | MEDICATED_PAD | Freq: Every day | CUTANEOUS | Status: DC
  Administered 2012-06-16 – 2012-06-18 (×3): 6 via TOPICAL

## 2012-06-16 MED ORDER — EPHEDRINE SULFATE 50 MG/ML IJ SOLN
INTRAMUSCULAR | Status: DC | PRN
  Administered 2012-06-16 (×10): 5 mg via INTRAVENOUS

## 2012-06-16 MED ORDER — HYDROMORPHONE HCL PF 1 MG/ML IJ SOLN
0.2500 mg | INTRAMUSCULAR | Status: DC | PRN
  Administered 2012-06-16 (×4): 0.5 mg via INTRAVENOUS

## 2012-06-16 MED ORDER — DEXTROSE 5 % IV SOLN
1.5000 g | Freq: Two times a day (BID) | INTRAVENOUS | Status: AC
  Administered 2012-06-16 – 2012-06-17 (×2): 1.5 g via INTRAVENOUS
  Filled 2012-06-16 (×2): qty 1.5

## 2012-06-16 MED ORDER — PROPOFOL 10 MG/ML IV EMUL
INTRAVENOUS | Status: DC | PRN
  Administered 2012-06-16: 80 mg via INTRAVENOUS
  Administered 2012-06-16: 20 mg via INTRAVENOUS

## 2012-06-16 MED ORDER — NITROGLYCERIN IN D5W 200-5 MCG/ML-% IV SOLN
INTRAVENOUS | Status: DC | PRN
  Administered 2012-06-16: 16.6 ug/min via INTRAVENOUS

## 2012-06-16 MED ORDER — DEXTROSE-NACL 5-0.9 % IV SOLN
INTRAVENOUS | Status: DC
  Administered 2012-06-16: 125 mL/h via INTRAVENOUS
  Administered 2012-06-17: 50 mL/h via INTRAVENOUS
  Administered 2012-06-18 – 2012-06-20 (×3): via INTRAVENOUS
  Administered 2012-06-21: 20 mL via INTRAVENOUS

## 2012-06-16 SURGICAL SUPPLY — 85 items
APPLICATOR TIP COSEAL (VASCULAR PRODUCTS) IMPLANT
APPLICATOR TIP EXT COSEAL (VASCULAR PRODUCTS) IMPLANT
BENZOIN TINCTURE PRP APPL 2/3 (GAUZE/BANDAGES/DRESSINGS) ×4 IMPLANT
BLADE SURG 10 STRL SS (BLADE) ×4 IMPLANT
BRUSH CYTOL CELLEBRITY 1.5X140 (MISCELLANEOUS) IMPLANT
CANISTER SUCTION 2500CC (MISCELLANEOUS) ×8 IMPLANT
CATH KIT ON Q 5IN SLV (PAIN MANAGEMENT) ×4 IMPLANT
CATH THORACIC 28FR (CATHETERS) ×8 IMPLANT
CATH THORACIC 36FR (CATHETERS) IMPLANT
CATH THORACIC 36FR RT ANG (CATHETERS) IMPLANT
CLIP TI MEDIUM 24 (CLIP) ×4 IMPLANT
CLIP TI MEDIUM 6 (CLIP) ×8 IMPLANT
CLOTH BEACON ORANGE TIMEOUT ST (SAFETY) ×8 IMPLANT
CONT SPEC 4OZ CLIKSEAL STRL BL (MISCELLANEOUS) ×16 IMPLANT
COVER SURGICAL LIGHT HANDLE (MISCELLANEOUS) ×8 IMPLANT
COVER TABLE BACK 60X90 (DRAPES) ×4 IMPLANT
DRAPE LAPAROSCOPIC ABDOMINAL (DRAPES) ×8 IMPLANT
DRAPE SLUSH MACHINE 52X66 (DRAPES) IMPLANT
DRAPE SLUSH/WARMER DISC (DRAPES) ×4 IMPLANT
DRILL BIT 7/64X5 (BIT) ×4 IMPLANT
ELECT BLADE 4.0 EZ CLEAN MEGAD (MISCELLANEOUS) ×4
ELECT REM PT RETURN 9FT ADLT (ELECTROSURGICAL) ×8
ELECTRODE BLDE 4.0 EZ CLN MEGD (MISCELLANEOUS) ×3 IMPLANT
ELECTRODE REM PT RTRN 9FT ADLT (ELECTROSURGICAL) ×6 IMPLANT
FORCEPS BIOP RJ4 1.8 (CUTTING FORCEPS) IMPLANT
GLOVE BIO SURGEON STRL SZ 6.5 (GLOVE) ×16 IMPLANT
GLOVE SURG SIGNA 7.5 PF LTX (GLOVE) ×4 IMPLANT
GOWN STRL NON-REIN LRG LVL3 (GOWN DISPOSABLE) ×24 IMPLANT
GOWN STRL REIN XL XLG (GOWN DISPOSABLE) ×4 IMPLANT
HANDLE STAPLE ENDO GIA SHORT (STAPLE) ×2
HEMOSTAT SURGICEL 2X14 (HEMOSTASIS) ×4 IMPLANT
KIT BASIN OR (CUSTOM PROCEDURE TRAY) ×8 IMPLANT
KIT ROOM TURNOVER OR (KITS) ×8 IMPLANT
KIT SUCTION CATH 14FR (SUCTIONS) ×4 IMPLANT
MARKER SKIN DUAL TIP RULER LAB (MISCELLANEOUS) ×4 IMPLANT
NEEDLE BIOPSY TRANSBRONCH 21G (NEEDLE) IMPLANT
NS IRRIG 1000ML POUR BTL (IV SOLUTION) ×16 IMPLANT
OIL SILICONE PENTAX (PARTS (SERVICE/REPAIRS)) ×4 IMPLANT
PACK CHEST (CUSTOM PROCEDURE TRAY) ×8 IMPLANT
PAD ARMBOARD 7.5X6 YLW CONV (MISCELLANEOUS) ×16 IMPLANT
RELOAD EGIA 45 MED/THCK PURPLE (STAPLE) ×8 IMPLANT
RELOAD EGIA 45 TAN VASC (STAPLE) ×12 IMPLANT
RELOAD EGIA TRIS TAN 45 CVD (STAPLE) ×12 IMPLANT
RELOAD ENDO GIA 30 3.5 (STAPLE) ×4 IMPLANT
SEALANT PROGEL (MISCELLANEOUS) ×4 IMPLANT
SEALANT SURG COSEAL 4ML (VASCULAR PRODUCTS) IMPLANT
SEALANT SURG COSEAL 8ML (VASCULAR PRODUCTS) IMPLANT
SOLUTION ANTI FOG 6CC (MISCELLANEOUS) ×8 IMPLANT
SPONGE GAUZE 4X4 12PLY (GAUZE/BANDAGES/DRESSINGS) ×4 IMPLANT
STAPLER ENDO GIA 12MM SHORT (STAPLE) ×6 IMPLANT
STAPLER TA30 4.8 NON-ABS (STAPLE) ×4 IMPLANT
SUT PROLENE 3 0 SH DA (SUTURE) IMPLANT
SUT PROLENE 4 0 RB 1 (SUTURE) ×1
SUT PROLENE 4-0 RB1 .5 CRCL 36 (SUTURE) ×3 IMPLANT
SUT PROLENE 5 0 C 1 36 (SUTURE) ×4 IMPLANT
SUT SILK  1 MH (SUTURE) ×4
SUT SILK 1 MH (SUTURE) ×12 IMPLANT
SUT SILK 2 0SH CR/8 30 (SUTURE) IMPLANT
SUT SILK 3 0SH CR/8 30 (SUTURE) IMPLANT
SUT STEEL 1 (SUTURE) ×16 IMPLANT
SUT VIC AB 1 CTX 18 (SUTURE) ×4 IMPLANT
SUT VIC AB 1 CTX 36 (SUTURE)
SUT VIC AB 1 CTX36XBRD ANBCTR (SUTURE) IMPLANT
SUT VIC AB 2-0 CTX 36 (SUTURE) ×8 IMPLANT
SUT VIC AB 2-0 UR6 27 (SUTURE) ×4 IMPLANT
SUT VIC AB 3-0 MH 27 (SUTURE) ×4 IMPLANT
SUT VIC AB 3-0 SH 8-18 (SUTURE) IMPLANT
SUT VIC AB 3-0 X1 27 (SUTURE) ×12 IMPLANT
SUT VICRYL 2 TP 1 (SUTURE) ×4 IMPLANT
SWAB COLLECTION DEVICE MRSA (MISCELLANEOUS) IMPLANT
SYR 20ML ECCENTRIC (SYRINGE) ×4 IMPLANT
SYSTEM SAHARA CHEST DRAIN ATS (WOUND CARE) ×4 IMPLANT
SYSTEM SAHARA CHEST DRAIN RE-I (WOUND CARE) ×4 IMPLANT
TAPE CLOTH 4X10 WHT NS (GAUZE/BANDAGES/DRESSINGS) ×4 IMPLANT
TAPE CLOTH SOFT 2X10 (GAUZE/BANDAGES/DRESSINGS) ×4 IMPLANT
TAPE UMBILICAL 1/8 X36 TWILL (MISCELLANEOUS) ×4 IMPLANT
TIP APPLICATOR SPRAY EXTEND 16 (VASCULAR PRODUCTS) ×4 IMPLANT
TOWEL OR 17X24 6PK STRL BLUE (TOWEL DISPOSABLE) ×12 IMPLANT
TOWEL OR 17X26 10 PK STRL BLUE (TOWEL DISPOSABLE) ×16 IMPLANT
TRAP SPECIMEN MUCOUS 40CC (MISCELLANEOUS) ×8 IMPLANT
TRAY FOLEY CATH 14FRSI W/METER (CATHETERS) ×8 IMPLANT
TUBE ANAEROBIC SPECIMEN COL (MISCELLANEOUS) IMPLANT
TUBE CONNECTING 12X1/4 (SUCTIONS) ×4 IMPLANT
TUNNELER SHEATH ON-Q 11GX8 (MISCELLANEOUS) ×4 IMPLANT
WATER STERILE IRR 1000ML POUR (IV SOLUTION) ×16 IMPLANT

## 2012-06-16 NOTE — Anesthesia Postprocedure Evaluation (Signed)
  Anesthesia Post-op Note  Patient: [name] [name]  Procedure(s) Performed: Procedure(s) (LRB): VIDEO BRONCHOSCOPY (N/A) LYMPH NODE DISSECTION () THORACOTOMY/LOBECTOMY (Left)  Patient Location: PACU  Anesthesia Type: General  Level of Consciousness: awake and sedated  Airway and Oxygen Therapy: Patient Spontanous Breathing and Patient connected to nasal cannula oxygen  Post-op Pain: none  Post-op Assessment: Post-op Vital signs reviewed  Post-op Vital Signs: Reviewed  Complications: No apparent anesthesia complications

## 2012-06-16 NOTE — OR Nursing (Signed)
Bronch ended at 08:05; VATS started at 09:10.

## 2012-06-16 NOTE — Progress Notes (Signed)
Patient ID: [name] [name], male   DOB: March 06, 1936, 76 y.o.   MRN: [id number]                   [address]            [place] [address]          [phone]     Day of Surgery Procedure(s) (LRB): VIDEO BRONCHOSCOPY (N/A) LYMPH NODE DISSECTION () THORACOTOMY/LOBECTOMY (Left)  Total Length of Stay:  LOS: 0 days  BP 135/46  Pulse 42  Temp 98.7 F (37.1 C) (Oral)  Resp 11  SpO2 98%     . dextrose 5 % and 0.9% NaCl 125 mL/hr (06/16/12 1632)  . nitroGLYCERIN 16.67 mcg/min (06/16/12 1636)  . DISCONTD: nitroGLYCERIN 16.67 mcg/min (06/16/12 1629)     Lab Results  Component Value Date   WBC 5.9 06/15/2012   HGB 9.2* 06/16/2012   HCT 27.0* 06/16/2012   PLT 162 06/15/2012   GLUCOSE 112* 06/15/2012   ALT 20 06/15/2012   AST 14 06/15/2012   NA 143 06/16/2012   K 3.6 06/16/2012   CL 107 06/15/2012   CREATININE 0.89 06/15/2012   BUN 22 06/15/2012   CO2 23 06/15/2012   TSH 5.698* 11/30/2007   INR 1.01 06/15/2012   Dg Chest 2 View  06/15/2012  *RADIOLOGY REPORT*  Clinical Data: Preoperative radiograph  CHEST - 2 VIEW  Comparison: 05/18/2012 PET-CT  Findings: The known left hilar mass is without appreciable interval change though partially obscured by normal hilar structures on this radiograph.  Mild lung base atelectasis versus scarring. No pleural effusion or pneumothorax.  No acute osseous finding.  Heart size within normal limits.  IMPRESSION: The known left hilar mass is without appreciable interval change allowing for differences in technique.  Mild lung base scarring versus atelectasis.  Original Report Authenticated By: Waneta Martins, M.D.   Dg Chest Portable 1 View  06/16/2012  *RADIOLOGY REPORT*  Clinical Data: Postop left lobectomy.  PORTABLE CHEST - 1 VIEW  Comparison: 06/15/2012  Findings: Two left chest tubes are in place.  Small left apical pneumothorax.  Right central line is in place with the tip in the SVC.  Cardiomegaly with vascular congestion.   IMPRESSION: Two left chest tubes in place with small left pneumothorax.  Cardiomegaly, vascular congestion.  Original Report Authenticated By: Cyndie Chime, M.D.    Air leak, with cough  Good respiratory effort   DONNAVAN COVAULT MD  Beeper [phone] Office [phone] 06/16/2012 8:51 PM

## 2012-06-16 NOTE — H&P (Signed)
[address]            [place] [address]          [phone]        [name] [name] [facility] Medical Record #[id number] Date of Birth: 16-Jun-1936  Referring: No ref. provider found Primary Care: Lillia Mountain, MD  Chief Complaint:    No chief complaint on file.   History of Present Illness:    Patient is a 76 year old male with good functional status who had noted some chronic cough over the past several months nonproductive and without hemoptysis recent visit to his dermatologists and discussion about the cough led to a chest x-ray and subsequently a CT scan and referral to Dr. Elnita Maxwell for a left lung mass. 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 Kathrynn Running in 2006. The patient has a family history of carcinoma of the lung he is a distant smoker quitting in 1967, been smoking for approximately 15 years. After evaluation with CT scan PET scan and PFTs he presents to the office to talk about surgical options for treatment of presumed carcinoma lung. He returns today after cardiology clear ence to answer further questions about surgery.      Current Activity/ Functional Status: Patient is independent with mobility/ambulation, transfers, ADL's, IADL's.   Past Medical History  Diagnosis Date  . Lung mass   . Squamous cell carcinoma, face     history of, right upper cheek in 2004   . Skin cancer     Multiple skin cancers   . BPH (benign prostatic hyperplasia)   . Shingles   . Heart murmur     as a child  . Chronic kidney disease     kidney stones -small passed.  . Arthritis   . Trigger finger     Bilateral    Past Surgical History  Procedure Date  . Mohs surgery 2004  . Excision ot metastatic  lymph node followed by radiation 2006    .  Marland Kitchen Cataract extraction, bilateral   . Appendectomy   . Cardiac catheterization 05/2012    Has blockage- Treated with Medications.  Cardiologist - Dr Mayford Knife    Family History  Problem Relation Age of Onset  . Lung cancer Father   . Psoriasis Child   . Arthritis Child   . Heart disease Mother    patient has one son who developed sudden cardiac arrest and was thought to have Brugada syndrome.     History  Smoking status  . Former Smoker  . Types: Cigarettes  . Quit date: 12/30/1965  Smokeless tobacco  . Never Used    History  Alcohol Use  . Yes    infrequently- "indulge heavly when I do"     Allergies  Allergen Reactions  . Lipitor (Atorvastatin) Other (See Comments)    Muscle pain    Current Facility-Administered Medications  Medication Dose Route Frequency Provider Last Rate Last Dose  . cefUROXime (ZINACEF) 1.5 g in dextrose 5 %  50 mL IVPB  1.5 g Intravenous 60 min Pre-Op Delight Ovens, MD      . HYDROmorphone (DILAUDID) injection 0.25-0.5 mg  0.25-0.5 mg Intravenous Q5 min PRN Kerby Nora, MD      . mupirocin ointment (BACTROBAN) 2 %        1 application at 06/16/12 [phone]  . ondansetron (ZOFRAN) injection 4 mg  4 mg Intravenous Once PRN Kerby Nora, MD           Review of Systems:     Cardiac Review of Systems: Y or N  Chest Pain [ n   ]  Resting SOB [n   ] Exertional SOB  [ n ]  Myer Peer  ]   Pedal Edema [ n  ]    Palpitations [n  ] Syncope  Milo.Brash  ]   Presyncope [ n  ]  General Review of Systems: [Y] = yes [  ]=no Constitional: recent weight change Cove.Etienne  ]; anorexia [  ]; fatigue [  ]; nausea [  ]; night sweats [  ]; fever [ n ]; or chills [ n ];                                                                                                                                          Dental: poor dentition[ n ]; Last Dentist visit:partial plates  Eye : blurred vision [  ]; diplopia [   ]; vision changes [  ];   Amaurosis fugax[  ]; Resp: cough [  ];  wheezing[  ];  hemoptysis[  ]; shortness of breath[  ]; paroxysmal nocturnal dyspnea[  ]; dyspnea on exertion[  ]; or orthopnea[  ];  GI:  gallstones[  ], vomiting[  ];  dysphagia[  ]; melena[  ];  hematochezia [  ]; heartburn[  ];   Hx of  Colonoscopy[  ]; GU: kidney stones [  ]; hematuria[n  ];   dysuria [  ];  nocturia[ 1 time  ];  history of     obstruction [n  ];             Skin: rash, swelling[  ];, hair loss[  ];  peripheral edema[  ];  or itching[  ]; Musculosketetal: myalgias[  ];  joint swelling[mild  ];  joint erythema[  ];  joint pain[  ];  back pain[  ];  Heme/Lymph: bruising[  ];  bleeding[  ];  anemia[  ];  Neuro: TIA[  ];  headaches[  ];  stroke[  ];  vertigo[  ];  seizures[  ];   paresthesias[  ];  difficulty walking[  ];  Psych:depression[  ]; anxiety[  ];  Endocrine: diabetes[  ];  thyroid dysfunction[  ];  Immunizations: Flu Cove.Etienne  ]; Pneumococcal[y  ];  Other:  Physical Exam: BP 129/82  Pulse 67  Temp  98.6 F (37 C) (Oral)  Resp 18  SpO2 95%  General appearance: alert, cooperative and appears stated age Neurologic: intact Heart: regular rate and rhythm, S1, S2 normal, no murmur, click, rub or gallop and normal apical impulse Lungs: clear to auscultation bilaterally and normal percussion bilaterally Abdomen: soft, non-tender; bowel sounds normal; no masses,  no organomegaly Extremities: extremities normal, atraumatic, no cyanosis or edema and Homans sign is negative, no sign of DVT The patient has no cervical or supraclavicular adenopathy he does have some erythematous skin changes in the right neck which she relates to his previous radiation treatment Patient has no carotid bruits, femoral pulses popliteal DP and PT pulses are all full and intact.   Diagnostic Studies & Laboratory data:     Recent Radiology Findings:  Ct Chest W Contrast  05/05/2012  *RADIOLOGY REPORT*  Clinical Data: History of squamous cell carcinoma of  the right side the face.  Question left hilar adenopathy.  CT CHEST WITH CONTRAST  Technique:  Multidetector CT imaging of the chest was performed following the standard protocol during bolus administration of intravenous contrast.  Contrast: 80mL OMNIPAQUE IOHEXOL 300 MG/ML  SOLN  Comparison: Plain film of 02/26/2012.  Most recent chest CT of 10/29/2005.  Findings: Lung windows demonstrate mild right apical scarring. Posterior left upper lobe lung nodule, corresponding to the plain film abnormality.  This measures 2.4 x 2.6 cm on transverse image 28 and 2.8 cm on sagittal image 67.  This contacts the left major fissure, and extension into the superior segment left lower lobe cannot be excluded (also sagittal image 67). The lesion is positioned immediately adjacent to the descending aorta, including on image 28 of series 2.  Contacts the left upper lobe bronchus.  Soft tissue windows demonstrate no supraclavicular adenopathy. Heart size upper normal. Multivessel coronary artery atherosclerosis.  No pericardial or pleural effusion.  Small middle mediastinal nodes, without adenopathy.  A left paratracheal node measures 7 mm on image 26 versus 5 mm on the prior.  No hilar adenopathy.  Tiny hiatal hernia  Limited abdominal imaging demonstrates normal adrenal glands.  No significant findings.  Similar bone island within the upper thoracic spine on image 16.  IMPRESSION:  Plain film abnormality corresponds to a posterior left upper lobe lung nodule, consistent with primary bronchogenic carcinoma. Although this contacts the left major fissure and is immediately adjacent the aorta, there is no evidence of nodal metastasis.  By imaging, presuming non-small cell histology, T1bN0M0 or stage IIA  These results will be called to the ordering clinician or representative by the Radiologist Assistant, and communication documented in the PACS Dashboard.  Original Report Authenticated By: Consuello Bossier, M.D.   Nm Pet Image Initial  (pi) Skull Base To Thigh  05/18/2012  *RADIOLOGY REPORT*  Clinical Data: Initial treatment strategy for posterior left upper lobe lung nodule suspicious for primary bronchogenic carcinoma.  NUCLEAR MEDICINE PET CT SKULL BASE TO THIGH  Technique:  Technique:  14.6 mCi F-18 FDG was injected intravenously. CT data was obtained and used for attenuation correction and anatomic localization only.  (This was not acquired as a diagnostic CT examination.) Additional exam technical data entered on technologist worksheet.  Comparison: Chest CT 05/05/2012.  Prior PET of 04/29/2005.  Findings: Neck: No abnormal hypermetabolism.  Chest:  Hypermetabolism corresponding to the previously described posterior left upper lobe lung nodule.  This measures 2.7 cm and a S.U.V. max of 15.4 on image 78.  No abnormal thoracic nodal activity.  Abdomen/Pelvis:  Likely physiologic hypermetabolism about the right side of the anus, no CT correlate.  Sk[place]:  No hypermetabolism to suggest osseous metastasis.  CT images performed for attenuation correction demonstrate surgical changes within the right side of the neck.  Right-sided thyroid atrophy.  Chest findings deferred to recent diagnostic chest CT.  Coronary artery atherosclerosis. No acute superimposed process.  Normal adrenal glands.  Left greater than right renal collecting systems stones.  Right common iliac artery aneurysm, 1.6 cm on image 176.  Mild prostatomegaly.  Probable bone island in the sacrum.  Scattered sclerotic lesions within the thoracic spine are also likely bone islands.  IMPRESSION:  1.  Hypermetabolic posterior left upper lobe lung nodule, most consistent with primary bronchogenic carcinoma.  No evidence of thoracic nodal or extrathoracic hypermetabolic disease.  Presuming non-small cell histology, T1bN0M0 or stage IIA disease. 2.  Surgical changes in the right side of the neck, without hypermetabolic residual or recurrent disease. 3.  Right common iliac artery aneurysm.  4.  Renal calculi.  Original Report Authenticated By: Consuello Bossier, M.D.   Cath Results: Filed:  06/02/12 1240  Note Time:  06/02/12 1159          PROCEDURE: Left heart catheterization with selective coronary angiography, left ventriculogram.  INDICATIONS:  The risks, benefits, and details of the procedure were explained to the patient. The patient verbalized understanding and wanted to proceed. Informed written consent was obtained.  PROCEDURE TECHNIQUE: After Xylocaine anesthesia a 51F sheath was placed in the right femoral artery with a single anterior needle wall stick. Left coronary angiography was done using a Judkins L4 guide catheter. Right coronary angiography was done using a Judkins R4 guide catheter. Left ventriculography was done using a pigtail catheter.  CONTRAST: Total of 60cc.  COMPLICATIONS: None.  HEMODYNAMICS: Aortic pressure was 131/68mmHg; LV pressure was 122/49mmHg; LVEDP . There was no gradient between the left ventricle and aorta.  ANGIOGRAPHIC DATA: The left main coronary artery is widely patent and bifurcates into an LAD and left circumflex arteries.  The left anterior descending artery is widely patent in its proximal portion and gives rise to a first diagonal. The diagonal has a 80-90% ostial stenosis before bifurcating into a superior and inferior branches. The superior branch is very small. The inferior branch bifurcates into a superior and inferior branch which are moderate in caliber and patent. The LAD is patent after the first diagonal.  The left circumflex artery is widely patent and gives rise to a small to moderate OM1 which is patent. It then gives rise to a moderate sized OM2 which is patent. Distally it gives rise to an OM3 which is large and has a 70-80% stenosis in the proximal portion. The left circumflex has a 90% stenosis and tapers down to a very small caliber after the takeoff of the OM3.  The right coronary artery is widely patent with luminal  irregularities and distally bifurcates into a PDA and PL brnaches which are patent.  LEFT VENTRICULOGRAM: Left ventricular angiogram was done in the 30 RAO projection and revealed normal left ventricular wall motion and systolic function with an estimated ejection fraction of 30%. LVEDP was 15 mmHg.  IMPRESSIONS:  1. Normal left main coronary artery. 2. Normal left anterior descending artery with 80-90% ostial stenosis of a trifurcating diagonal 1. 3. Normal left circumflex artery in proximal and mid vessel with a 70-80% stenosis of the OM3 and 90% stenosis of the left circumflex after the OM3. 4. Normal right coronary artery  with luminal irregularities. 5. Normal left ventricular systolic function. LVEDP . Ejection fraction 30% RECOMMENDATION:  1. Films reviewed with Dr. Eldridge Dace. The diagonal is too small for PCI and therefore will be treated with nitrates. I will start Imdur 30mg  daily. He will also be continued on ASA 81mg  daily. He has not had any chest pain or SOB with any type of activity so at this point I feel that the OM3 should be treated medically with nitrates.  2. Start Coreg 3.125mg  BID and Altace 2.5mg  daily for his cardiomyopathy  3. Followup with me in the office in 1 week        PFT'S:  FVC 3.26, FEV1 2.62, DLCO 77%     Recent Lab Findings: Lab Results  Component Value Date   WBC 5.9 06/15/2012   HGB 13.0 06/15/2012   HCT 39.3 06/15/2012   PLT 162 06/15/2012   GLUCOSE 112* 06/15/2012   ALT 20 06/15/2012   AST 14 06/15/2012   NA 142 06/15/2012   K 4.1 06/15/2012   CL 107 06/15/2012   CREATININE 0.89 06/15/2012   BUN 22 06/15/2012   CO2 23 06/15/2012   TSH 5.698* 11/30/2007   INR 1.01 06/15/2012   EKG: 5/13/20013 Sinus rhythm with frequent Premature ventricular complexes in a pattern of bigeminy Possible Left atrial enlargement Left ventricular hypertrophy Nonspecific T wave abnormality Prolonged QT Abnormal ECG Since last tracing vent. bigeminy is new. 27mm/s  4mm/mV 100Hz  8.0.1 12SL 237 CID: [id number]  Assessment / Plan:    Patient is a 76 year old male with previous history of squamous cell carcinoma involving the right cheek in 2006 now presents with a PET avid 2.7 cm mass involving the left upper lobe centrally, from a clinical and radiographic standpoint this appears to be a T1bN0M0 or stage IIA carcinoma of the lung, but we have no tissue confirmation of this. The patient has good functional status and pulmonary function studies. He has had with preoperative clearance with cardiology Dr Mayford Knife including cath last week because of his abnormal EKG, and family history of Brugada syndrome. We discussed preoperative attempts at obtaining a tissue diagnosis, however I have told him I would still recommend surgical resection of the mass even if the needle biopsy was negative because of its suspicious nature, new onset, PET avid. It appears to be a new primary lung cancer, there is no evidence that it is metastatic spread from his previous malignancy.  The risks and options of surgery for the treatment of stage I lung cancer is discussed with the patient and his wife in detail, including the risks. Although the goal of surgery would be lobectomy, the position of the tumor on the pulmonary artery could necessitate a pneumonectomy.   The goals risks and alternatives of the planned surgical procedure bronchoscopy left video-assisted thoracoscopy left upper lobectomy have been discussed with the patient in detail. The risks of the procedure including death, infection, stroke, myocardial infarction, bleeding, blood transfusion have all been discussed specifically.  I have quoted [name] [name] a  3 % of perioperative mortality and a complication rate as high as 15 %. The patient's questions have been answered.[name] [name] is willing  to proceed with the planned procedure.     Delight Ovens MD  Beeper [phone] Office [phone] 06/16/2012 7:17 AM

## 2012-06-16 NOTE — Brief Op Note (Addendum)
                   [address]            [place] [address]          [phone]    06/16/2012  1:06 PM  PATIENT:  [name] [name]  76 y.o. male  PRE-OPERATIVE DIAGNOSIS:  (L) LUNG MASS  POST-OPERATIVE DIAGNOSIS:  left lung mass  PROCEDURE:  Procedure(s): VIDEO BRONCHOSCOPY LYMPH NODE DISSECTION THORACOTOMY/LOBECTOMY LEFT UPPER LOBE  SURGEON:  Surgeon(s): Delight Ovens, MD  PHYSICIAN ASSISTANT: WAYNE GOLD PA-C  ANESTHESIA:   general  SPECIMEN:  Source of Specimen:  LEFT UPPER LOBE/ MULTIPLE LN'S  DISPOSITION OF SPECIMEN:  Pathology  DRAINS: 2 Chest Tube(s) in the LEFT CHEST   PATIENT CONDITION:  PACU - hemodynamically stable.  FROZEN; NON-SMALL CELL CA- FAVORS SQUAMOUS CELL  COMPLICATIONS: NO KNOWN

## 2012-06-16 NOTE — Anesthesia Preprocedure Evaluation (Signed)
Anesthesia Evaluation  Patient identified by MRN, date of birth, ID band Patient awake    Reviewed: Allergy & Precautions, H&P , NPO status , Patient's Chart, lab work & pertinent test results  Airway Mallampati: II TM Distance: >3 FB Neck ROM: Full    Dental  (+) Teeth Intact and Dental Advisory Given   Pulmonary  breath sounds clear to auscultation        Cardiovascular Rhythm:Regular Rate:Normal     Neuro/Psych    GI/Hepatic   Endo/Other    Renal/GU      Musculoskeletal   Abdominal   Peds  Hematology   Anesthesia Other Findings   Reproductive/Obstetrics                           Anesthesia Physical Anesthesia Plan  ASA: II  Anesthesia Plan: General   Post-op Pain Management:    Induction: Intravenous  Airway Management Planned: Double Lumen EBT  Additional Equipment:   Intra-op Plan:   Post-operative Plan: Extubation in OR  Informed Consent: I have reviewed the patients History and Physical, chart, labs and discussed the procedure including the risks, benefits and alternatives for the proposed anesthesia with the patient or authorized representative who has indicated his/her understanding and acceptance.   Dental advisory given  Plan Discussed with: CRNA, Anesthesiologist and Surgeon  Anesthesia Plan Comments:         Anesthesia Quick Evaluation

## 2012-06-16 NOTE — Transfer of Care (Signed)
Immediate Anesthesia Transfer of Care Note  Patient: [name] [name]  Procedure(s) Performed: Procedure(s) (LRB): VIDEO BRONCHOSCOPY (N/A) LYMPH NODE DISSECTION () THORACOTOMY/LOBECTOMY (Left)  Patient Location: PACU  Anesthesia Type: General  Level of Consciousness: awake, alert  and oriented  Airway & Oxygen Therapy: Patient Spontanous Breathing and Patient connected to face mask oxygen  Post-op Assessment: Report given to PACU RN, Post -op Vital signs reviewed and stable and Patient moving all extremities X 4  Post vital signs: Reviewed and stable  Complications: No apparent anesthesia complications

## 2012-06-17 ENCOUNTER — Inpatient Hospital Stay (HOSPITAL_COMMUNITY): Payer: Medicare Other

## 2012-06-17 ENCOUNTER — Encounter (HOSPITAL_COMMUNITY): Payer: Self-pay | Admitting: Cardiothoracic Surgery

## 2012-06-17 LAB — POCT I-STAT 3, ART BLOOD GAS (G3+)
Acid-Base Excess: 1 mmol/L (ref 0.0–2.0)
Bicarbonate: 25 mEq/L — ABNORMAL HIGH (ref 20.0–24.0)
O2 Saturation: 97 %
Patient temperature: 99
TCO2: 26 mmol/L (ref 0–100)
pCO2 arterial: 39.1 mmHg (ref 35.0–45.0)
pH, Arterial: 7.415 (ref 7.350–7.450)
pO2, Arterial: 96 mmHg (ref 80.0–100.0)

## 2012-06-17 LAB — CBC
HCT: 31 % — ABNORMAL LOW (ref 39.0–52.0)
Hemoglobin: 10.2 g/dL — ABNORMAL LOW (ref 13.0–17.0)
MCH: 29.9 pg (ref 26.0–34.0)
MCHC: 32.9 g/dL (ref 30.0–36.0)

## 2012-06-17 LAB — BASIC METABOLIC PANEL
BUN: 17 mg/dL (ref 6–23)
Calcium: 7.5 mg/dL — ABNORMAL LOW (ref 8.4–10.5)
Creatinine, Ser: 0.77 mg/dL (ref 0.50–1.35)
GFR calc non Af Amer: 87 mL/min — ABNORMAL LOW (ref >90)
Glucose, Bld: 145 mg/dL — ABNORMAL HIGH (ref 70–99)

## 2012-06-17 MED ORDER — ENOXAPARIN SODIUM 30 MG/0.3ML ~~LOC~~ SOLN
30.0000 mg | Freq: Two times a day (BID) | SUBCUTANEOUS | Status: DC
  Administered 2012-06-17 – 2012-06-29 (×25): 30 mg via SUBCUTANEOUS
  Filled 2012-06-17 (×27): qty 0.3

## 2012-06-17 MED ORDER — BIOTENE DRY MOUTH MT LIQD
15.0000 mL | Freq: Two times a day (BID) | OROMUCOSAL | Status: DC
  Administered 2012-06-17 – 2012-06-20 (×7): 15 mL via OROMUCOSAL

## 2012-06-17 MED ORDER — ISOSORBIDE MONONITRATE ER 30 MG PO TB24
30.0000 mg | ORAL_TABLET | Freq: Every day | ORAL | Status: DC
  Administered 2012-06-17 – 2012-06-29 (×13): 30 mg via ORAL
  Filled 2012-06-17 (×13): qty 1

## 2012-06-17 NOTE — Progress Notes (Signed)
T[facility] BRIEF SICU PROGRESS NOTE  1 Day Post-Op  S/P Procedure(s) (LRB): VIDEO BRONCHOSCOPY (N/A) LYMPH NODE DISSECTION () THORACOTOMY/LOBECTOMY (Left)   Stable day  Plan: Continue current plan  Thressa Shiffer H 06/17/2012 8:40 PM

## 2012-06-17 NOTE — Clinical Documentation Improvement (Signed)
Anemia Blood Loss Clarification  THIS DOCUMENT IS NOT A PERMANENT PART OF THE MEDICAL RECORD  RESPOND TO THE THIS QUERY, FOLLOW THE INSTRUCTIONS BELOW:  1. If needed, update documentation for the patient's encounter via the notes activity.  2. Access this query again and click edit on the In [employer].  3. After updating, or not, click F2 to complete all highlighted (required) fields concerning your review. Select "additional documentation in the medical record" OR "no additional documentation provided".  4. Click Sign note button.  5. The deficiency will fall out of your In Basket *Please let us know if you are not able to complete this workflow by phone or e-mail (listed below).        06/17/12  Dear Dr.Gerhardt Marton Redwood  In an effort to better capture your patient's severity of illness, reflect appropriate length of stay and utilization of resources, a review of the patient medical record has revealed the following indicators.    Based on your clinical judgment, please clarify and document in a progress note and/or discharge summary the clinical condition associated with the following supporting information:  In responding to this query please exercise your independent judgment.  The fact that a query is asked, does not imply that any particular answer is desired or expected.  Possible Clinical Conditions?   " Expected Acute Blood Loss Anemia  " Acute Blood Loss Anemia  " Precipitous drop in Hematocrit  " Other Condition  " Cannot Clinically Determine    Supporting Information:  Signs and Symptoms: EBL: per 6/18 Anesthesia record  Diagnostics: H&H on 6/17:   13.0/39.3 H&H on 6/18:    9.2/27.0  Treatments: IV fluids / plasma expanders: 6/18:   Albumin 5%,  Reviewed:  Additional documentation provided per 6/20 progress notes.                                                         Thank You,  Marciano Sequin,  Clinical  Documentation Specialist:  Pager: [phone]  Health Information Management 

## 2012-06-17 NOTE — Care Management Note (Unsigned)
    Page 1 of 1   06/17/2012     3:27:58 PM   CARE MANAGEMENT NOTE 06/17/2012  Patient:  [name] [name], [name] [name]   Account Number:  [id number]  Date Initiated:  06/17/2012  Documentation initiated by:  Deryck Hippler  Subjective/Objective Assessment:   PT S/P VATS THORACOTOMY AND LOBECTOMY OF LT UPPER LOBE ON 06/16/12. PTA, PT INDEPENDENT, LIVES WITH WIFE.     Action/Plan:   MET WITH PT TO DISCUSS DC PLANS.  WIFE TO PROVIDE CARE AT DC.  WILL FOLLOW FOR HOME NEEDS AS PT PROGRESSES.   Anticipated DC Date:  06/21/2012   Anticipated DC Plan:  HOME W HOME HEALTH SERVICES      DC Planning Services  CM consult      Choice offered to / List presented to:             Status of service:  In process, will continue to follow Medicare Important Message given?   (If response is "NO", the following Medicare IM given date fields will be blank) Date Medicare IM given:   Date Additional Medicare IM given:    Discharge Disposition:    Per UR Regulation:    If discussed at Long Length of Stay Meetings, dates discussed:    Comments:

## 2012-06-17 NOTE — Progress Notes (Addendum)
[address]            [place] [address]          [phone]     1 Day Post-Op Procedure(s) (LRB): VIDEO BRONCHOSCOPY (N/A) LYMPH NODE DISSECTION () THORACOTOMY/LOBECTOMY (Left)  Subjective: OOB in chair, eating breakfast without difficulty. Loose cough, but not coughing anything up.  Objective: Vital signs in last 24 hours: Patient Vitals for the past 24 hrs:  BP Temp Temp src Pulse Resp SpO2 Height Weight  06/17/12 0804 - 98.1 F (36.7 C) Oral - - - - -  06/17/12 0700 117/55 mmHg - - 84  22  100 % - -  06/17/12 0600 112/60 mmHg - - 77  22  97 % - -  06/17/12 0500 104/53 mmHg - - 76  17  98 % - -  06/17/12 0400 109/52 mmHg 98.1 F (36.7 C) Oral 79  18  100 % - -  06/17/12 0300 106/56 mmHg - - 77  13  100 % - -  06/17/12 0200 112/62 mmHg - - 87  19  100 % - -  06/17/12 0100 109/50 mmHg - - 76  12  99 % - -  06/17/12 0000 111/55 mmHg 98.3 F (36.8 C) Oral 80  16  100 % - -  06/16/12 2300 105/48 mmHg - - 82  15  99 % - -  06/16/12 2200 105/55 mmHg - - 85  13  96 % - -  06/16/12 2100 109/57 mmHg - - 78  12  97 % - -  06/16/12 2028 - 98.7 F (37.1 C) Oral - - - - -  06/16/12 2000 118/54 mmHg - - 84  16  99 % - -  06/16/12 1900 104/43 mmHg - - 81  16  99 % - -  06/16/12 1800 113/52 mmHg - - 83  12  97 % - -  06/16/12 1700 126/70 mmHg - - 84  16  99 % - -  06/16/12 1600 124/57 mmHg 98.1 F (36.7 C) Oral 56  16  97 % 5\' 4"  (1.626 m) 167 lb 15.9 oz (76.2 kg)  06/16/12 1515 - - - - 11  - - -  06/16/12 1500 - 97.2 F (36.2 C) - - 12  98 % - -  06/16/12 1445 - - - 42  15  98 % - -  06/16/12 1439 - - - 43  15  99 % - -  06/16/12 1437 135/46 mmHg - - - - - - -  06/16/12 1430 - - - 42  13  100 % - -  06/16/12 1422 - - - 68  17  100 % - -  06/16/12 1421 146/61 mmHg - - - - - - -  06/16/12 1415 - - - 87  13  100 % - -  06/16/12 1406 134/81 mmHg - - - - - - -  06/16/12 1400 - - - 89  20  98 % - -  06/16/12 1351 129/65 mmHg - - - - - - -    06/16/12 1345 - - - 88  20  96 % - -  06/16/12 1332 126/103 mmHg - - 88  18  99 % - -   Current Weight  06/16/12 167 lb 15.9 oz (76.2 kg)     Intake/Output from previous  day: 06/18 0701 - 06/19 0700 In: 6547 [P.O.:720; I.V.:5427; IV Piggyback:400] Out: 2708 [Urine:1270; Blood:500; Chest Tube:938]    PHYSICAL EXAM:  Heart: RRR, some PVCs Lungs: coarse BS on L Wound:dressed and dry Chest tube: +1 continuous air leak which is increased with cough   Lab Results: CBC: Basename 06/17/12 0515 06/16/12 1205 06/15/12 1316  WBC 8.5 -- 5.9  HGB 10.2* 9.2* --  HCT 31.0* 27.0* --  PLT 125* -- 162   BMET:  Basename 06/17/12 0515 06/16/12 1205 06/15/12 1316  NA 138 143 --  K 3.7 3.6 --  CL 105 -- 107  CO2 26 -- 23  GLUCOSE 145* -- 112*  BUN 17 -- 22  CREATININE 0.77 -- 0.89  CALCIUM 7.5* -- 9.0    PT/INR:  Basename 06/15/12 1316  LABPROT 13.5  INR 1.01   CXR: stable, small L apical ptx  Assessment/Plan: S/P Procedure(s) (LRB): VIDEO BRONCHOSCOPY (N/A) LYMPH NODE DISSECTION () THORACOTOMY/LOBECTOMY (Left) Chest tube with air leak. Continue to suction for now. BPs stable, will d/c NTG and resume Imdur. Mobilize, pulm toilet, routine progression. Tx 3300.   LOS: 1 day    COLLINS,GINA H 06/17/2012   I have seen and examined [name] and agree with the above assessment  and plan.  Delight Ovens MD Beeper [phone] Office [phone] 06/17/2012 8:56 AM

## 2012-06-18 ENCOUNTER — Inpatient Hospital Stay (HOSPITAL_COMMUNITY): Payer: Medicare Other

## 2012-06-18 LAB — COMPREHENSIVE METABOLIC PANEL
Alkaline Phosphatase: 45 U/L (ref 39–117)
BUN: 13 mg/dL (ref 6–23)
CO2: 26 mEq/L (ref 19–32)
Chloride: 100 mEq/L (ref 96–112)
GFR calc Af Amer: >90 mL/min (ref >90)
Glucose, Bld: 125 mg/dL — ABNORMAL HIGH (ref 70–99)
Potassium: 3.9 mEq/L (ref 3.5–5.1)
Total Bilirubin: 0.8 mg/dL (ref 0.3–1.2)

## 2012-06-18 LAB — CBC
HCT: 31.8 % — ABNORMAL LOW (ref 39.0–52.0)
Hemoglobin: 10.5 g/dL — ABNORMAL LOW (ref 13.0–17.0)
WBC: 10.9 10*3/uL — ABNORMAL HIGH (ref 4.0–10.5)

## 2012-06-18 MED ORDER — CHLORHEXIDINE GLUCONATE CLOTH 2 % EX PADS
6.0000 | MEDICATED_PAD | Freq: Every day | CUTANEOUS | Status: AC
  Administered 2012-06-19 – 2012-06-20 (×2): 6 via TOPICAL

## 2012-06-18 MED ORDER — RAMIPRIL 2.5 MG PO CAPS
2.5000 mg | ORAL_CAPSULE | Freq: Every day | ORAL | Status: DC
  Administered 2012-06-18 – 2012-06-22 (×5): 2.5 mg via ORAL
  Filled 2012-06-18 (×5): qty 1

## 2012-06-18 MED ORDER — MUPIROCIN 2 % EX OINT
TOPICAL_OINTMENT | Freq: Two times a day (BID) | CUTANEOUS | Status: AC
  Administered 2012-06-18 – 2012-06-21 (×6): via NASAL
  Filled 2012-06-18: qty 22

## 2012-06-18 NOTE — Progress Notes (Signed)
Pt admitted from 2300, oriented to unit and routines, pain management and safety concerns discussed pt and family verbalize understanding

## 2012-06-18 NOTE — Progress Notes (Addendum)
[address]            [place] [address]          [phone]     2 Days Post-Op Procedure(s) (LRB): VIDEO BRONCHOSCOPY (N/A) LYMPH NODE DISSECTION () THORACOTOMY/LOBECTOMY (Left)  Subjective: Stable night, no new issues. Walked in halls yesterday without difficulty.   Objective: Vital signs in last 24 hours: Patient Vitals for the past 24 hrs:  BP Temp Temp src Pulse Resp SpO2 Weight  06/18/12 0728 - 98.7 F (37.1 C) Oral - - - -  06/18/12 0600 125/63 mmHg - - 90  20  97 % -  06/18/12 0500 140/73 mmHg - - 99  16  96 % 177 lb 14.6 oz (80.7 kg)  06/18/12 0409 - 99.7 F (37.6 C) Oral - 25  97 % -  06/18/12 0400 141/77 mmHg - - 90  21  96 % -  06/18/12 0300 134/67 mmHg - - 57  21  93 % -  06/18/12 0200 138/63 mmHg - - 44  20  96 % -  06/18/12 0100 128/61 mmHg - - 88  19  96 % -  06/18/12 0000 124/70 mmHg 99.9 F (37.7 C) Oral 91  20  96 % -  06/17/12 2350 - - - - 25  95 % -  06/17/12 2300 135/71 mmHg - - 98  20  96 % -  06/17/12 2200 144/78 mmHg - - 96  23  97 % -  06/17/12 2100 149/78 mmHg - - 93  21  96 % -  06/17/12 2011 - 100.5 F (38.1 C) Oral - - - -  06/17/12 2000 139/74 mmHg - - 93  25  97 % -  06/17/12 1900 134/73 mmHg - - 91  25  95 % -  06/17/12 1800 130/69 mmHg - - 99  24  92 % -  06/17/12 1700 119/60 mmHg - - 86  27  96 % -  06/17/12 1600 109/64 mmHg - - 40  20  88 % -  06/17/12 1554 - 98.6 F (37 C) Oral - - - -  06/17/12 1500 109/60 mmHg - - 58  19  96 % -  06/17/12 1400 110/55 mmHg - - 82  24  93 % -  06/17/12 1300 126/97 mmHg - - 85  20  97 % -  06/17/12 1200 120/60 mmHg 98.8 F (37.1 C) Oral 77  20  98 % -  06/17/12 1100 124/83 mmHg - - 89  22  97 % -  06/17/12 1000 112/65 mmHg - - 80  21  98 % -  06/17/12 0945 - - - 83  21  99 % -  06/17/12 0930 - - - 83  15  98 % -  06/17/12 0915 - - - 85  22  98 % -  06/17/12 0900 105/66 mmHg - - 87  18  99 % -  06/17/12 0845 - - - 90  19  96 % -  06/17/12 0837 - - - - -  98 % -  06/17/12 0832 134/57 mmHg - - 80  - - -  06/17/12 0830 - - - 88  16  99 % -  06/17/12 0815 - - - 78  17  99 % -  06/17/12 0804 - 98.1 F (36.7 C) Oral - - - -  06/17/12 0800 101/66 mmHg - - 83  22  99 % -   Current Weight  06/18/12 177 lb 14.6 oz (80.7 kg)     Intake/Output from previous day: 06/19 0701 - 06/20 0700 In: 2269 [P.O.:960; I.V.:1159; IV Piggyback:150] Out: 1680 [Urine:1105; Chest Tube:575]    PHYSICAL EXAM:  Heart: RRR, freq PVCs Lungs: scattered wheezes bilaterally Wound: dressed and dry Chest tube: stable 1/7 air leak    Lab Results: CBC: Basename 06/18/12 0500 06/17/12 0515  WBC 10.9* 8.5  HGB 10.5* 10.2*  HCT 31.8* 31.0*  PLT 117* 125*   BMET:  Basename 06/18/12 0500 06/17/12 0515  NA 132* 138  K 3.9 3.7  CL 100 105  CO2 26 26  GLUCOSE 125* 145*  BUN 13 17  CREATININE 0.73 0.77  CALCIUM 7.9* 7.5*    PT/INR:  Basename 06/15/12 1316  LABPROT 13.5  INR 1.01   CXR not done yet  Path: squamous cell carcinoma (T2a, N0) Reviewed with the patient    Assessment/Plan: S/P Procedure(s) (LRB): VIDEO BRONCHOSCOPY (N/A) LYMPH NODE DISSECTION () THORACOTOMY/LOBECTOMY (Left) CXR order inadvertently cancelled yesterday.  Will reorder am CXR and follow up. Pulm- continue pulm toilet, will add flutter valve. CV- BPs still elevated.  Back on Coreg and Imdur.  Will resume Altace as renal function is stable. Having frequent PVCs this am-will monitor. LGF last night, probably due to atelectasis.  Continue to monitor.  WBC up slightly (8K->10K) D/c Foley, continue ambulation, awaiting tx 3300. Expected acute  Blood  loss anemia, not requiring transfusion   LOS: 2 days    COLLINS,GINA H 06/18/2012   I have seen and examined [name] and agree with the above assessment  and plan.  Delight Ovens MD Beeper [phone] Office [phone] 06/18/2012 8:44 AM

## 2012-06-19 ENCOUNTER — Inpatient Hospital Stay (HOSPITAL_COMMUNITY): Payer: Medicare Other

## 2012-06-19 LAB — TYPE AND SCREEN
ABO/RH(D): O POS
Antibody Screen: NEGATIVE
Unit division: 0
Unit division: 0

## 2012-06-19 MED ORDER — LACTULOSE 10 GM/15ML PO SOLN
30.0000 g | Freq: Every day | ORAL | Status: DC | PRN
  Administered 2012-06-19 – 2012-06-20 (×2): 30 g via ORAL
  Filled 2012-06-19 (×2): qty 45

## 2012-06-19 MED ORDER — LEVALBUTEROL HCL 0.63 MG/3ML IN NEBU
0.6300 mg | INHALATION_SOLUTION | Freq: Three times a day (TID) | RESPIRATORY_TRACT | Status: DC
  Administered 2012-06-19 – 2012-06-23 (×13): 0.63 mg via RESPIRATORY_TRACT
  Filled 2012-06-19 (×17): qty 3

## 2012-06-19 NOTE — Op Note (Signed)
[name] [name], THOMANN NO.:  [id number]  MEDICAL RECORD NO.:  [id number]  LOCATION:  3315                         FACILITY:  [facility]  PHYSICIAN:  Sheliah Plane, MD    DATE OF BIRTH:  11/24/1936  DATE OF PROCEDURE:  06/19/2012 DATE OF DISCHARGE:                              OPERATIVE REPORT   PREOPERATIVE DIAGNOSIS:  Left upper lobe lung mass.  POSTOPERATIVE DIAGNOSES:  Left upper lobe lung mass, squamous cell carcinoma, non-small cell carcinoma by frozen section.  PROCEDURE PERFORMED:  Video bronchoscopy, left video-assisted thoracoscopy with mini thoracotomy, resection of left upper lobe and portion of superior segment left lower lobe and lymph node dissection.  SURGEON:  Sheliah Plane, MD  FIRST ASSISTANT:  Ines Bloomer, M.D.  SECOND ASSISTANT:  Rowe Clack, PA-C  BRIEF HISTORY:  The patient is a 76 year old male with previous history of squamous cell carcinoma involving the cheek in 2006.  The patient did well from this without evidence of further disease, but presented recently to Dr. Rolm Baptise with abnormal chest x-ray.  Further evaluation of this including CT scan and PET scan revealed a left upper lobe lung lesion approach along the major fissure and in close proximity to the aorta without evidence of nodal metastasis.  Resection was recommended to the patient, although we did not have a tissue diagnosis. The position of the mass made it difficult for a needle biopsy or ENB. The risks and options of surgery were discussed with the patient in detail and he was agreeable.  DESCRIPTION OF PROCEDURE:  After appropriate time-out, procedure was performed.  The patient underwent general endotracheal anesthesia with a single-lumen endotracheal tube.  Through this, the bronchoscope was passed to the subsegmental level in the both right and left lung.  There were no endobronchial lesions appreciated.  The scope was then removed. Double-lumen  endotracheal tube was placed.  The patient was turned in the lateral decubitus position with the left side up.  The site of surgery was confirmed radiographically.  It had been marked preoperatively.  The left chest was prepped with Betadine and draped in usual sterile manner.  Initially, a small incision in midaxillary line was made and a video scope introduced into the chest.  The lung collapsed reasonably well.  There were no pleural based metastases appreciated, though the lesion did appear to across the fissure, but did not appear to be directly invading the aorta.  A slightly larger incision was made through this and 2 lower port sites.  We began dissecting free the left upper lobe.  First dissected in the fissure and then superiorly 3 separate pulmonary artery branches to the upper lobe were identified and divided with a vascular stapler.  As we dissected, it became apparent the patient had an abnormal takeoff of the upper lobe bronchus with 1 branch coming posterior to the pulmonary artery to the posterior segment and a separate branch to the lingula and remaining upper lobe going underneath the pulmonary artery.  The lingular branch was stapled and divided with a TA stapler.  With this, I divided the pulmonary vein, was then divided with a vascular stapler.  This left the primary tumor  and the accessory bronchus to the upper lobe.  Further dissection of the remaining upper lobe bronchus was then divided and stapled with a blue endostapler.  It was apparent that the tumor had not invaded the aorta and was freely movable from it, but did invade into the superior segment of upper lobe.  Using a stapler, the portion of the superior segment of the left lower lobe was divided and removed with the specimen.  With this, the specimen was then able to be removed.  The bronchial stump was checked for air leak.  The inferior pulmonary ligament was divided.  Additional lymph nodes in the tube  and 4 L positions were removed and submitted separately to pathology.  The left lower lobe was re-expanded.  Two 28 chest tubes were left in place.  The rib was reapproximated with a single pericostal suture.  Muscles were closed in layers with interrupted 0 Vicryl, running 3-0 Vicryl and the subcutaneous tissue 3-0 subcuticular stitch and skin edges.  The patient was awakened and extubated in the operating room.  Sponge and needle count was reported as correct at the completion of the procedure.  ESTIMATED BLOOD LOSS:  Approximately 300 mL.  The patient did not require any blood transfusion.  The patient was then transferred to the recovery room for further postoperative care in good condition.     Sheliah Plane, MD     EG/MEDQ  D:  06/19/2012  T:  06/19/2012  Job:  [id number]

## 2012-06-19 NOTE — Progress Notes (Signed)
Fentanyl PCA 35 ml wasted in sink; witnessed by Julien Girt, RN. Renette Butters, Viona Gilmore

## 2012-06-19 NOTE — Progress Notes (Signed)
[address]            [place] [address]          [phone]     3 Days Post-Op  Procedure(s) (LRB): VIDEO BRONCHOSCOPY (N/A) LYMPH NODE DISSECTION () THORACOTOMY/LOBECTOMY (Left) Subjective: Feels some tightness with breathing  Objective  Telemetry sinus rhythm, pvc's   Temp:  [97.6 F (36.4 C)-99.6 F (37.6 C)] 98.3 F (36.8 C) (06/21 0735) Pulse Rate:  [67-86] 82  (06/21 0350) Resp:  [16-25] 20  (06/21 0350) BP: (91-125)/(47-82) 115/62 mmHg (06/21 0350) SpO2:  [90 %-97 %] 90 % (06/21 0350)   Intake/Output Summary (Last 24 hours) at 06/19/12 0747 Last data filed at 06/19/12 0500  Gross per 24 hour  Intake   1509 ml  Output   1090 ml  Net    419 ml       General appearance: alert, cooperative and no distress Heart: regular rate and rhythm and occas extrasystole Lungs: + exp wheexe, + coarse and diminished in bases Abdomen: + BS, mod distension, soft, nontender Extremities: no edema Wound: incision healing well  Lab Results:  Basename 06/18/12 0500 06/17/12 0515  NA 132* 138  K 3.9 3.7  CL 100 105  CO2 26 26  GLUCOSE 125* 145*  BUN 13 17  CREATININE 0.73 0.77  CALCIUM 7.9* 7.5*  MG -- --  PHOS -- --    Basename 06/18/12 0500  AST 17  ALT 15  ALKPHOS 45  BILITOT 0.8  PROT 5.3*  ALBUMIN 2.6*   No results found for this basename: LIPASE:2,AMYLASE:2 in the last 72 hours  Basename 06/18/12 0500 06/17/12 0515  WBC 10.9* 8.5  NEUTROABS -- --  HGB 10.5* 10.2*  HCT 31.8* 31.0*  MCV 90.9 90.9  PLT 117* 125*   No results found for this basename: CKTOTAL:4,CKMB:4,TROPONINI:4 in the last 72 hours No components found with this basename: POCBNP:3 No results found for this basename: DDIMER in the last 72 hours No results found for this basename: HGBA1C in the last 72 hours No results found for this basename: CHOL,HDL,LDLCALC,TRIG,CHOLHDL in the last 72 hours No results found for this basename:  TSH,T4TOTAL,FREET3,T3FREE,THYROIDAB in the last 72 hours No results found for this basename: VITAMINB12,FOLATE,FERRITIN,TIBC,IRON,RETICCTPCT in the last 72 hours  Medications: Scheduled    . antiseptic oral rinse  15 mL Mouth Rinse BID  . aspirin EC  81 mg Oral Daily  . bisacodyl  10 mg Oral Daily  . carvedilol  3.125 mg Oral BID WC  . Chlorhexidine Gluconate Cloth  6 each Topical Q0600  . enoxaparin (LOVENOX) injection  30 mg Subcutaneous Q12H  . fentaNYL   Intravenous Q4H  . finasteride  5 mg Oral Daily  . isosorbide mononitrate  30 mg Oral Daily  . mupirocin ointment   Nasal BID  . ramipril  2.5 mg Oral Daily  . Tamsulosin HCl  0.4 mg Oral QPC supper  . DISCONTD: Chlorhexidine Gluconate Cloth  6 each Topical Daily  . DISCONTD: mupirocin ointment  1 application Nasal BID     Radiology/Studies:  Dg Chest Port 1 View  06/19/2012  *RADIOLOGY REPORT*  Clinical Data: Post left upper lobectomy  PORTABLE CHEST - 1 VIEW  Comparison: 06/18/2012; 06/17/2012; 06/06/2012; PET CT - 05/18/2012  Findings: Grossly unchanged enlarged cardiac silhouette and mediastinal contours.  Stable positioning of support apparatus. Unchanged tiny left apical pneumothorax.  The amount of right lateral chest wall subcutaneous emphysema is grossly unchanged. Grossly unchanged left basilar heterogeneous opacities and likely small left-sided effusion.  The right hemithorax is unchanged. Unchanged bones.  IMPRESSION: 1.  Stable positioning of support apparatus.  No pneumothorax. Unchanged tiny left apical pneumothorax. 2.  Grossly unchanged small left sided effusion of left basilar heterogeneous opacities, possibly atelectasis.  Original Report Authenticated By: Waynard Reeds, M.D.   Dg Chest Port 1 View  06/18/2012  *RADIOLOGY REPORT*  Clinical Data: Status post left upper lobectomy  PORTABLE CHEST - 1 VIEW  Comparison: 06/17/2012  Findings: Cardiomegaly and hilar prominence is similar to prior. Status post left upper  lobectomy.  Two left-sided chest tubes are in place.  There is a small residual left-sided pneumothorax. Right subclavian central venous catheter tip projects over the proximal SVC.  Central vascular congestion and mild lung base opacities are similar to prior.  There is increased retrocardiac opacity.  No acute osseous change.  IMPRESSION: Increased retrocardiac opacity; atelectasis or aspiration.  Unchanged left apical pneumothorax status post left upper lobectomy.  Left-sided chest tubes in place.  Original Report Authenticated By: Waneta Martins, M.D.   Chest tube- + air leak, 315 cc drainage/24 hours   INR: Will add last result for INR, ABG once components are confirmed Will add last 4 CBG results once components are confirmed  Assessment/Plan: S/P Procedure(s) (LRB): VIDEO BRONCHOSCOPY (N/A) LYMPH NODE DISSECTION () THORACOTOMY/LOBECTOMY (Left)  1 needs bronchodilators/ aggressive pulm toilet 2 labs stable 3 bp improved 4 rehab 5 analgesics 6 keep chest tubes 7 monitor pvc's may need increased beta blocker 8 may have some degree of ileus- will add lactulose   LOS: 3 days    Fallynn Gravett E 6/21/20137:47 AM

## 2012-06-20 ENCOUNTER — Inpatient Hospital Stay (HOSPITAL_COMMUNITY): Payer: Medicare Other

## 2012-06-20 MED ORDER — BISACODYL 10 MG RE SUPP
10.0000 mg | Freq: Once | RECTAL | Status: AC
  Administered 2012-06-20: 10 mg via RECTAL
  Filled 2012-06-20: qty 1

## 2012-06-20 MED ORDER — NYSTATIN 100000 UNIT/GM EX OINT
TOPICAL_OINTMENT | Freq: Two times a day (BID) | CUTANEOUS | Status: DC
  Administered 2012-06-20 – 2012-06-28 (×17): via TOPICAL
  Filled 2012-06-20 (×2): qty 15

## 2012-06-20 MED ORDER — SODIUM CHLORIDE 0.9 % IJ SOLN
10.0000 mL | Freq: Two times a day (BID) | INTRAMUSCULAR | Status: DC
  Administered 2012-06-20 – 2012-06-22 (×4): 10 mL via INTRAVENOUS
  Administered 2012-06-23: 09:00:00 via INTRAVENOUS
  Administered 2012-06-24 – 2012-06-27 (×4): 10 mL via INTRAVENOUS
  Filled 2012-06-20 (×3): qty 10
  Filled 2012-06-20 (×2): qty 20

## 2012-06-20 MED ORDER — FLEET ENEMA 7-19 GM/118ML RE ENEM: 1.0000 | ENEMA | Freq: Every day | RECTAL | Status: DC | PRN

## 2012-06-20 MED ORDER — CARVEDILOL 6.25 MG PO TABS
6.2500 mg | ORAL_TABLET | Freq: Two times a day (BID) | ORAL | Status: DC
  Administered 2012-06-20 – 2012-06-23 (×7): 6.25 mg via ORAL
  Filled 2012-06-20 (×10): qty 1

## 2012-06-20 MED ORDER — FLUCONAZOLE 150 MG PO TABS
150.0000 mg | ORAL_TABLET | Freq: Once | ORAL | Status: AC
  Administered 2012-06-20: 150 mg via ORAL
  Filled 2012-06-20: qty 1

## 2012-06-20 NOTE — Progress Notes (Addendum)
4 Days Post-Op Procedure(s) (LRB): VIDEO BRONCHOSCOPY (N/A) LYMPH NODE DISSECTION () THORACOTOMY/LOBECTOMY (Left)  Subjective: [name] [name] has no new complaints this morning.  He has not been able to have a bowel movement despite taking stool softners and Lactulose.   He does have a large rash on his back.  Nursing states the patient was soaked after sitting in the chair yesterday, but was unaware because he does not have any feeling there.  Objective: Vital signs in last 24 hours: Temp:  [97.6 F (36.4 C)-98.9 F (37.2 C)] 98.1 F (36.7 C) (06/22 0718) Pulse Rate:  [37-100] 86  (06/22 1000) Cardiac Rhythm:  [-] Normal sinus rhythm (06/22 0715) Resp:  [16-25] 20  (06/22 1000) BP: (107-135)/(51-73) 134/59 mmHg (06/22 1000) SpO2:  [91 %-96 %] 96 % (06/22 1000)  Intake/Output from previous day: 06/21 0701 - 06/22 0700 In: 1200 [I.V.:1200] Out: 740 [Urine:200; Chest Tube:540] Intake/Output this shift: Total I/O In: 460 [P.O.:360; I.V.:100] Out: -   General appearance: alert, cooperative and no distress Heart: regular rate and rhythm Lungs: diminished breath sounds LLL Abdomen: soft, non-tender; bowel sounds normal; no masses,  no organomegaly Wound: clean and dry Skin: red, bumpy rash along patients back  Lab Results:  Basename 06/18/12 0500  WBC 10.9*  HGB 10.5*  HCT 31.8*  PLT 117*   BMET:  Basename 06/18/12 0500  NA 132*  K 3.9  CL 100  CO2 26  GLUCOSE 125*  BUN 13  CREATININE 0.73  CALCIUM 7.9*    PT/INR: No results found for this basename: LABPROT,INR in the last 72 hours ABG    Component Value Date/Time   PHART 7.415 06/17/2012 0519   HCO3 25.0* 06/17/2012 0519   TCO2 26 06/17/2012 0519   ACIDBASEDEF 5.0* 06/16/2012 1205   O2SAT 97.0 06/17/2012 0519   CBG (last 3)  No results found for this basename: GLUCAP:3 in the last 72 hours  Assessment/Plan: S/P Procedure(s) (LRB): VIDEO BRONCHOSCOPY (N/A) LYMPH NODE DISSECTION () THORACOTOMY/LOBECTOMY  (Left)  1.Chest tube in place- + air leak, 540cc output last 24 hours, will leave on suction today 2. Resp- + Hemoptysis this morning, appears to be old blood,will monitor 3. CXR- resolution of pneumothorax, + left basilar atelectasis/opacity ?possible pneumonia 4. Constipation- patient currently receiving Lacutlose, if no result will try Fleets enema 5. Rash- ? Yeast dermatitis- will try Nystatin cream and one oral dose of Diflucan 6. CV- rate in the 80s pressure runnings 130s, occasional PVCs, will increase Coreg if patient can tolerate    LOS: 4 days    [name], [name] 06/20/2012   I have seen and examined [name] [name] and agree with the above assessment  and plan.  Delight Ovens MD Beeper [phone] Office [phone] 06/20/2012 11:13 AM

## 2012-06-21 ENCOUNTER — Inpatient Hospital Stay (HOSPITAL_COMMUNITY): Payer: Medicare Other

## 2012-06-21 LAB — CBC
HCT: 30.4 % — ABNORMAL LOW (ref 39.0–52.0)
Hemoglobin: 10 g/dL — ABNORMAL LOW (ref 13.0–17.0)
MCH: 29.9 pg (ref 26.0–34.0)
MCHC: 32.9 g/dL (ref 30.0–36.0)
MCV: 91 fL (ref 78.0–100.0)
Platelets: 166 10*3/uL (ref 150–400)
RBC: 3.34 MIL/uL — ABNORMAL LOW (ref 4.22–5.81)
RDW: 13.6 % (ref 11.5–15.5)
WBC: 6 10*3/uL (ref 4.0–10.5)

## 2012-06-21 LAB — BASIC METABOLIC PANEL
BUN: 12 mg/dL (ref 6–23)
CO2: 27 mEq/L (ref 19–32)
Calcium: 8.5 mg/dL (ref 8.4–10.5)
Chloride: 103 mEq/L (ref 96–112)
Creatinine, Ser: 0.77 mg/dL (ref 0.50–1.35)
GFR calc Af Amer: >90 mL/min (ref >90)
GFR calc non Af Amer: 87 mL/min — ABNORMAL LOW (ref >90)
Glucose, Bld: 121 mg/dL — ABNORMAL HIGH (ref 70–99)
Potassium: 4 mEq/L (ref 3.5–5.1)
Sodium: 138 mEq/L (ref 135–145)

## 2012-06-21 MED ORDER — BISACODYL 10 MG RE SUPP
10.0000 mg | Freq: Every day | RECTAL | Status: DC | PRN
  Administered 2012-06-21 – 2012-06-28 (×3): 10 mg via RECTAL
  Filled 2012-06-21 (×3): qty 1

## 2012-06-21 MED ORDER — DIPHENHYDRAMINE HCL 25 MG PO CAPS
25.0000 mg | ORAL_CAPSULE | Freq: Every evening | ORAL | Status: DC | PRN
  Administered 2012-06-21 – 2012-06-27 (×7): 25 mg via ORAL
  Filled 2012-06-21 (×7): qty 1

## 2012-06-21 MED ORDER — FUROSEMIDE 10 MG/ML IJ SOLN
40.0000 mg | Freq: Once | INTRAMUSCULAR | Status: AC
  Administered 2012-06-21: 40 mg via INTRAVENOUS
  Filled 2012-06-21: qty 4

## 2012-06-21 NOTE — Progress Notes (Signed)
Patient ID: [name] [name], male   DOB: [birth year], 76 y.o.   MRN: [id number]                   [address]            ,[place] [address]          [phone]     5 Days Post-Op Procedure(s) (LRB): VIDEO BRONCHOSCOPY (N/A) LYMPH NODE DISSECTION () THORACOTOMY/LOBECTOMY (Left)  LOS: 5 days   Subjective: Bowel movement yesterday  Objective: Vital signs in last 24 hours: Patient Vitals for the past 24 hrs:  BP Temp Temp src Pulse Resp SpO2  06/21/12 0930 120/83 mmHg - - 80  20  100 %  06/21/12 0820 - - - - - 98 %  06/21/12 0745 107/62 mmHg - - 66  20  100 %  06/21/12 0743 - 98.3 F (36.8 C) Oral - - -  06/21/12 0300 150/62 mmHg 98.3 F (36.8 C) Oral 81  16  99 %  06/20/12 2300 123/75 mmHg 98.3 F (36.8 C) Oral 86  17  96 %  06/20/12 2009 - - - - - 97 %  06/20/12 1900 131/69 mmHg 98.4 F (36.9 C) Oral 91  23  98 %  06/20/12 1555 141/64 mmHg 99.4 F (37.4 C) Oral 58  21  97 %  06/20/12 1527 - - - - - 97 %  06/20/12 1213 134/67 mmHg 98.8 F (37.1 C) Oral 83  21  96 %    Filed Weights   06/16/12 1600 06/18/12 0500  Weight: 167 lb 15.9 oz (76.2 kg) 177 lb 14.6 oz (80.7 kg)    Hemodynamic parameters for last 24 hours:    Intake/Output from previous day: 06/22 0701 - 06/23 0700 In: 1560 [P.O.:960; I.V.:600] Out: 690 [Urine:400; Chest Tube:290] Intake/Output this shift: Total I/O In: 460 [P.O.:360; I.V.:100] Out: 110 [Chest Tube:110]  Scheduled Meds:   . aspirin EC  81 mg Oral Daily  . bisacodyl  10 mg Oral Daily  . bisacodyl  10 mg Rectal Once  . carvedilol  6.25 mg Oral BID WC  . enoxaparin (LOVENOX) injection  30 mg Subcutaneous Q12H  . finasteride  5 mg Oral Daily  . fluconazole  150 mg Oral Once  . furosemide  40 mg Intravenous Once  . isosorbide mononitrate  30 mg Oral Daily  . levalbuterol  0.63 mg Nebulization TID  . mupirocin ointment   Nasal BID  . nystatin ointment   Topical BID  . ramipril  2.5 mg Oral Daily  . sodium chloride   10 mL Intravenous Q12H  . Tamsulosin HCl  0.4 mg Oral QPC supper  . DISCONTD: antiseptic oral rinse  15 mL Mouth Rinse BID  . DISCONTD: carvedilol  3.125 mg Oral BID WC   Continuous Infusions:   . dextrose 5 % and 0.9% NaCl 50 mL/hr at 06/21/12 0600   PRN Meds:.bisacodyl, diphenhydrAMINE, lactulose, oxyCODONE-acetaminophen, oxyCODONE-acetaminophen, potassium chloride, senna-docusate, sodium phosphate, traMADol  General appearance: alert, cooperative and no distress Neurologic: intact Heart: regular rate and rhythm, S1, S2 normal, no murmur, click, rub or gallop Lungs: diminished breath sounds LLL and LUL Abdomen: abnormal findings:  distended and hypoactive bowel sounds and not tender and improved from yesterday Extremities: edema bilaterial thigh edema Wound: intact some weeping of fluid around tubes  Lab Results: CBC:No results found for this basename: WBC:2,HGB:2,HCT:2,PLT:2 in the last 72 hours BMET: No results found for this basename: NA:2,K:2,CL:2,CO2:2,GLUCOSE:2,BUN:2,CREATININE:2,CALCIUM:2 in  the last 72 hours  PT/INR: No results found for this basename: LABPROT,INR in the last 72 hours   Radiology Dg Chest [facility] 1 View  06/21/2012  *RADIOLOGY REPORT*  Clinical Data: Chest tube  PORTABLE CHEST - 1 VIEW  Comparison: 06/20/2012  Findings: Right subclavian central venous catheter and left chest tubes are stable.  No pneumothorax.  Dense opacity in the left mid and lower lung zones is stable with low volumes.  Right lung is also under aerated.  Mild cardiomegaly.  IMPRESSION: Stable exam.  Persistent opacity at the left base.  No pneumothorax.  Original Report Authenticated By: Donavan Burnet, M.D.   Dg Chest Port 1 View  06/20/2012  *RADIOLOGY REPORT*  Clinical Data: Left upper lobectomy for lung cancer.  PORTABLE CHEST - 1 VIEW 06/20/2012 0611 hours:  Comparison: Portable chest x-rays yesterday and dating back to 06/16/2012.  Findings: Two left chest tubes remain in place with no  residual pneumothorax.  Persistent dense consolidation at the left lung base, unchanged.  Stable mild atelectasis the right lung base.  No new pulmonary parenchymal abnormalities.  Right subclavian central venous catheter tip in the SVC.  Cardiac silhouette upper normal in size to slightly enlarged for technique, unchanged.  Pulmonary vascularity normal.  IMPRESSION: Support apparatus satisfactory.  Resolution of left pneumothorax. Stable dense atelectasis and/or pneumonia at the left base and mild right basilar atelectasis.  No new abnormalities.  Original Report Authenticated By: Arnell Sieving, M.D.     Assessment/Plan: S/P Procedure(s) (LRB): VIDEO BRONCHOSCOPY (N/A) LYMPH NODE DISSECTION () THORACOTOMY/LOBECTOMY (Left) Mobilize Diuresis d/c tubes/lines no air leak, d/c one chest tube Check cbc and k  Delight Ovens MD 06/21/2012 10:19 AM

## 2012-06-22 ENCOUNTER — Inpatient Hospital Stay (HOSPITAL_COMMUNITY): Payer: Medicare Other

## 2012-06-22 LAB — BASIC METABOLIC PANEL
BUN: 16 mg/dL (ref 6–23)
CO2: 25 mEq/L (ref 19–32)
Calcium: 7.6 mg/dL — ABNORMAL LOW (ref 8.4–10.5)
Chloride: 105 mEq/L (ref 96–112)
Creatinine, Ser: 0.85 mg/dL (ref 0.50–1.35)
GFR calc Af Amer: >90 mL/min (ref >90)
GFR calc non Af Amer: 84 mL/min — ABNORMAL LOW (ref >90)
Glucose, Bld: 447 mg/dL — ABNORMAL HIGH (ref 70–99)
Potassium: 3.5 mEq/L (ref 3.5–5.1)
Sodium: 138 mEq/L (ref 135–145)

## 2012-06-22 LAB — CBC
HCT: 28 % — ABNORMAL LOW (ref 39.0–52.0)
Hemoglobin: 9.3 g/dL — ABNORMAL LOW (ref 13.0–17.0)
MCH: 30.5 pg (ref 26.0–34.0)
MCHC: 33.2 g/dL (ref 30.0–36.0)
MCV: 91.8 fL (ref 78.0–100.0)
Platelets: 163 10*3/uL (ref 150–400)
RBC: 3.05 MIL/uL — ABNORMAL LOW (ref 4.22–5.81)
RDW: 13.7 % (ref 11.5–15.5)
WBC: 5.1 10*3/uL (ref 4.0–10.5)

## 2012-06-22 LAB — GLUCOSE, CAPILLARY: Glucose-Capillary: 107 mg/dL — ABNORMAL HIGH (ref 70–99)

## 2012-06-22 MED ORDER — FUROSEMIDE 10 MG/ML IJ SOLN
40.0000 mg | Freq: Once | INTRAMUSCULAR | Status: AC
  Administered 2012-06-22: 40 mg via INTRAVENOUS
  Filled 2012-06-22: qty 4

## 2012-06-22 MED ORDER — RAMIPRIL 5 MG PO CAPS
5.0000 mg | ORAL_CAPSULE | Freq: Every day | ORAL | Status: DC
  Administered 2012-06-23 – 2012-06-25 (×3): 5 mg via ORAL
  Filled 2012-06-22 (×3): qty 1

## 2012-06-22 MED ORDER — POTASSIUM CHLORIDE CRYS ER 20 MEQ PO TBCR
40.0000 meq | EXTENDED_RELEASE_TABLET | Freq: Once | ORAL | Status: AC
  Administered 2012-06-22: 40 meq via ORAL
  Filled 2012-06-22: qty 2

## 2012-06-22 NOTE — Progress Notes (Signed)
Pt heart rhythm NSR to Bygeminy and Trigeminy. VS stable at 147/63 heart rate at  92 sats 91% room air afebrile denies pain. MD on call notified Dr. Cornelius Moras. No new orders. Will continue to monitor.

## 2012-06-22 NOTE — Progress Notes (Addendum)
RE: Lab work glu 447: a finger stick was done this am and result was 107. May have been maint IVF in lab specimen. Will continue to monitor.  Pt amb hall twice today, tolerated.

## 2012-06-22 NOTE — Progress Notes (Addendum)
[address]            [place] [address]          [phone]     6 Days Post-Op  Procedure(s) (LRB): VIDEO BRONCHOSCOPY (N/A) LYMPH NODE DISSECTION () THORACOTOMY/LOBECTOMY (Left) Subjective: Having some SOB, but relatively mild, still constipated  Objective  Telemetry sinus , PVC's   Temp:  [97.7 F (36.5 C)-98.8 F (37.1 C)] 98.4 F (36.9 C) (06/24 0724) Pulse Rate:  [44-83] 74  (06/24 0415) Resp:  [19-21] 20  (06/24 0415) BP: (113-138)/(47-83) 138/69 mmHg (06/24 0724) SpO2:  [93 %-100 %] 99 % (06/24 0724)   Intake/Output Summary (Last 24 hours) at 06/22/12 0811 Last data filed at 06/22/12 0725  Gross per 24 hour  Intake   1235 ml  Output   1645 ml  Net   -410 ml       General appearance: alert, cooperative and no distress Heart: regular rate and rhythm Lungs: diminished Left base Abdomen: mod distension. + BS, nontender Extremities: + BLE edema Wound: incis healing well  Lab Results:  Basename 06/22/12 0500 06/21/12 1040  NA 138 138  K 3.5 4.0  CL 105 103  CO2 25 27  GLUCOSE 447* 121*  BUN 16 12  CREATININE 0.85 0.77  CALCIUM 7.6* 8.5  MG -- --  PHOS -- --   No results found for this basename: AST:2,ALT:2,ALKPHOS:2,BILITOT:2,PROT:2,ALBUMIN:2 in the last 72 hours No results found for this basename: LIPASE:2,AMYLASE:2 in the last 72 hours  Basename 06/22/12 0500 06/21/12 1040  WBC 5.1 6.0  NEUTROABS -- --  HGB 9.3* 10.0*  HCT 28.0* 30.4*  MCV 91.8 91.0  PLT 163 166   No results found for this basename: CKTOTAL:4,CKMB:4,TROPONINI:4 in the last 72 hours No components found with this basename: POCBNP:3 No results found for this basename: DDIMER in the last 72 hours No results found for this basename: HGBA1C in the last 72 hours No results found for this basename: CHOL,HDL,LDLCALC,TRIG,CHOLHDL in the last 72 hours No results found for this basename: TSH,T4TOTAL,FREET3,T3FREE,THYROIDAB in the last 72 hours No  results found for this basename: VITAMINB12,FOLATE,FERRITIN,TIBC,IRON,RETICCTPCT in the last 72 hours  Medications: Scheduled    . aspirin EC  81 mg Oral Daily  . bisacodyl  10 mg Oral Daily  . carvedilol  6.25 mg Oral BID WC  . enoxaparin (LOVENOX) injection  30 mg Subcutaneous Q12H  . finasteride  5 mg Oral Daily  . furosemide  40 mg Intravenous Once  . isosorbide mononitrate  30 mg Oral Daily  . levalbuterol  0.63 mg Nebulization TID  . mupirocin ointment   Nasal BID  . nystatin ointment   Topical BID  . ramipril  2.5 mg Oral Daily  . sodium chloride  10 mL Intravenous Q12H  . Tamsulosin HCl  0.4 mg Oral QPC supper     Radiology/Studies:  Dg Chest Port 1 View  06/22/2012  *RADIOLOGY REPORT*  Clinical Data: Postop chest tube.  PORTABLE CHEST - 1 VIEW  Comparison: 06/21/2012  Findings: Support devices are unchanged.  Small left pneumothorax. Left mid and lower lung airspace disease, likely atelectasis. Small left pleural effusion.  Right lung is clear.  The heart is borderline in size.  IMPRESSION: New small left apical pneumothorax.  Otherwise no change.  Original Report Authenticated By: Cyndie Chime, M.D.   Dg Chest [facility]  06/21/2012  *RADIOLOGY REPORT*  Clinical Data: Chest tube  PORTABLE CHEST - 1 VIEW  Comparison: 06/20/2012  Findings: Right subclavian central venous catheter and left chest tubes are stable.  No pneumothorax.  Dense opacity in the left mid and lower lung zones is stable with low volumes.  Right lung is also under aerated.  Mild cardiomegaly.  IMPRESSION: Stable exam.  Persistent opacity at the left base.  No pneumothorax.  Original Report Authenticated By: Donavan Burnet, M.D.    Chest tube- no air leak, 255 cc out yesterday  INR: Will add last result for INR, ABG once components are confirmed Will add last 4 CBG results once components are confirmed  Assessment/Plan: S/P Procedure(s) (LRB): VIDEO BRONCHOSCOPY (N/A) LYMPH NODE DISSECTION  () THORACOTOMY/LOBECTOMY (Left)   Doing well Keep chest tube Replace K+ Glucose  447?- repeat pulm toilet Cont llasix   LOS: 6 days    GOLD,[name] 6/24/20138:11 AM   glucose not elevated, drawing error No air leak from ct leave one more day  I have seen and examined [name] and agree with the above assessment  and plan.  Delight Ovens MD Beeper [phone] Office [phone] 06/22/2012 3:06 PM

## 2012-06-23 ENCOUNTER — Inpatient Hospital Stay (HOSPITAL_COMMUNITY): Payer: Medicare Other

## 2012-06-23 MED ORDER — LEVALBUTEROL HCL 0.63 MG/3ML IN NEBU
0.6300 mg | INHALATION_SOLUTION | Freq: Four times a day (QID) | RESPIRATORY_TRACT | Status: DC | PRN
  Administered 2012-06-25 – 2012-06-27 (×2): 0.63 mg via RESPIRATORY_TRACT
  Filled 2012-06-23: qty 3

## 2012-06-23 NOTE — Progress Notes (Addendum)
7 Days Post-Op Procedure(s) (LRB): VIDEO BRONCHOSCOPY (N/A) LYMPH NODE DISSECTION () THORACOTOMY/LOBECTOMY (Left) Subjective: Patient with multiple concerns.  He still feels somewhat constipated despite having bowel movements.  He also states he is concerned because his legs remain somewhat swollen.  He also is concerned about the rash on his back stating he thinks it may be a staph infection, because he had that happen in the past before  Objective: Vital signs in last 24 hours: Temp:  [98.2 F (36.8 C)-98.8 F (37.1 C)] 98.8 F (37.1 C) (06/25 0430) Pulse Rate:  [43-93] 78  (06/25 0430) Cardiac Rhythm:  [-] Normal sinus rhythm (06/24 2300) Resp:  [12-24] 23  (06/25 0430) BP: (110-147)/(52-80) 140/80 mmHg (06/25 0805) SpO2:  [91 %-98 %] 94 % (06/25 0430)  Intake/Output from previous day: 06/24 0701 - 06/25 0700 In: 360 [P.O.:360] Out: 2900 [Urine:2300; Chest Tube:600]  General appearance: alert, cooperative and no distress Heart: regular rate and rhythm Lungs: clear to auscultation bilaterally Abdomen: soft, non-tender; bowel sounds normal; no masses,  no organomegaly Wound: clean and dry Skin: back with rash, appears to have worseneded from this weekend  Lab Results:  Basename 06/22/12 0500 06/21/12 1040  WBC 5.1 6.0  HGB 9.3* 10.0*  HCT 28.0* 30.4*  PLT 163 166   BMET:  Basename 06/22/12 0500 06/21/12 1040  NA 138 138  K 3.5 4.0  CL 105 103  CO2 25 27  GLUCOSE 447* 121*  BUN 16 12  CREATININE 0.85 0.77  CALCIUM 7.6* 8.5    PT/INR: No results found for this basename: LABPROT,INR in the last 72 hours ABG    Component Value Date/Time   PHART 7.415 06/17/2012 0519   HCO3 25.0* 06/17/2012 0519   TCO2 26 06/17/2012 0519   ACIDBASEDEF 5.0* 06/16/2012 1205   O2SAT 97.0 06/17/2012 0519   CBG (last 3)   Basename 06/22/12 0722  GLUCAP 107*    Assessment/Plan: S/P Procedure(s) (LRB): VIDEO BRONCHOSCOPY (N/A) LYMPH NODE DISSECTION () THORACOTOMY/LOBECTOMY  (Left)  1. Chest tube in place- no  air leak, 30cc output in last 12 hours 2. Rash- being treated with Nystatin cream, does not appear to be improving, may need ABX for possible staph infection 3. Constipation- colace and dulcolax as needed last bowel movement over the weekend 4. Pulm- patient weaned off oxygen, continue IS 5. Volume Overload- continue Lasix    LOS: 7 days   D/c ct now I have seen and examined [name] and will proceed with the above assessment  and plan.  Delight Ovens MD Beeper [phone] Office [phone] 06/23/2012 3:56 PM

## 2012-06-24 ENCOUNTER — Inpatient Hospital Stay (HOSPITAL_COMMUNITY): Payer: Medicare Other | Admitting: Certified Registered"

## 2012-06-24 ENCOUNTER — Encounter (HOSPITAL_COMMUNITY): Payer: Self-pay | Admitting: Certified Registered"

## 2012-06-24 ENCOUNTER — Encounter (HOSPITAL_COMMUNITY)
Admission: RE | Disposition: A | Discharge status: Home / Self Care | Payer: Self-pay | Source: Ambulatory Visit | Attending: Cardiothoracic Surgery

## 2012-06-24 ENCOUNTER — Inpatient Hospital Stay (HOSPITAL_COMMUNITY): Payer: Medicare Other

## 2012-06-24 DIAGNOSIS — J9819 Other pulmonary collapse: Secondary | ICD-10-CM

## 2012-06-24 HISTORY — PX: VIDEO BRONCHOSCOPY: SHX5072

## 2012-06-24 LAB — CBC
HCT: 29.4 % — ABNORMAL LOW (ref 39.0–52.0)
HCT: 29 % — ABNORMAL LOW (ref 39.0–52.0)
Hemoglobin: 9.9 g/dL — ABNORMAL LOW (ref 13.0–17.0)
Hemoglobin: 9.6 g/dL — ABNORMAL LOW (ref 13.0–17.0)
MCH: 30.3 pg (ref 26.0–34.0)
MCHC: 33.7 g/dL (ref 30.0–36.0)
MCV: 89.9 fL (ref 78.0–100.0)
Platelets: 206 10*3/uL (ref 150–400)
RBC: 3.27 MIL/uL — ABNORMAL LOW (ref 4.22–5.81)
RBC: 3.23 MIL/uL — ABNORMAL LOW (ref 4.22–5.81)
RDW: 13.7 % (ref 11.5–15.5)
RDW: 13.6 % (ref 11.5–15.5)
WBC: 11.4 10*3/uL — ABNORMAL HIGH (ref 4.0–10.5)
WBC: 13.6 10*3/uL — ABNORMAL HIGH (ref 4.0–10.5)

## 2012-06-24 LAB — BASIC METABOLIC PANEL
BUN: 17 mg/dL (ref 6–23)
CO2: 27 mEq/L (ref 19–32)
Calcium: 8.3 mg/dL — ABNORMAL LOW (ref 8.4–10.5)
Chloride: 99 mEq/L (ref 96–112)
Creatinine, Ser: 0.87 mg/dL (ref 0.50–1.35)
GFR calc Af Amer: >90 mL/min (ref >90)
GFR calc non Af Amer: 83 mL/min — ABNORMAL LOW (ref >90)
Glucose, Bld: 141 mg/dL — ABNORMAL HIGH (ref 70–99)
Potassium: 4.2 mEq/L (ref 3.5–5.1)
Sodium: 133 mEq/L — ABNORMAL LOW (ref 135–145)

## 2012-06-24 LAB — PROTIME-INR: INR: 1.21 (ref 0.00–1.49)

## 2012-06-24 LAB — URINALYSIS, ROUTINE W REFLEX MICROSCOPIC
Ketones, ur: NEGATIVE mg/dL
Nitrite: NEGATIVE
Protein, ur: 30 mg/dL — AB
Urobilinogen, UA: 1 mg/dL (ref 0.0–1.0)

## 2012-06-24 LAB — APTT: aPTT: 29 seconds (ref 24–37)

## 2012-06-24 SURGERY — BRONCHOSCOPY, VIDEO-ASSISTED
Anesthesia: Monitor Anesthesia Care | Wound class: Clean Contaminated

## 2012-06-24 MED ORDER — CARVEDILOL 12.5 MG PO TABS
12.5000 mg | ORAL_TABLET | Freq: Two times a day (BID) | ORAL | Status: DC
  Administered 2012-06-24 – 2012-06-25 (×3): 12.5 mg via ORAL
  Filled 2012-06-24 (×5): qty 1

## 2012-06-24 MED ORDER — LIDOCAINE HCL 1 % IJ SOLN
INTRAMUSCULAR | Status: DC | PRN
  Administered 2012-06-24: 60 mg via INTRADERMAL

## 2012-06-24 MED ORDER — BUTAMBEN-TETRACAINE-BENZOCAINE 2-2-14 % EX AERO
INHALATION_SPRAY | CUTANEOUS | Status: DC | PRN
  Administered 2012-06-24: 1 via TOPICAL

## 2012-06-24 MED ORDER — PROPOFOL 10 MG/ML IV EMUL
INTRAVENOUS | Status: DC | PRN
  Administered 2012-06-24: 20 mg via INTRAVENOUS
  Administered 2012-06-24: 30 mg via INTRAVENOUS

## 2012-06-24 MED ORDER — LACTATED RINGERS IV SOLN
INTRAVENOUS | Status: DC
  Administered 2012-06-24: 13:00:00 via INTRAVENOUS

## 2012-06-24 MED ORDER — PROMETHAZINE HCL 25 MG/ML IJ SOLN: 6.2500 mg | INTRAMUSCULAR | Status: DC | PRN

## 2012-06-24 MED ORDER — KETOROLAC TROMETHAMINE 30 MG/ML IJ SOLN: 15.0000 mg | Freq: Once | INTRAMUSCULAR | Status: DC | PRN

## 2012-06-24 MED ORDER — MEPERIDINE HCL 25 MG/ML IJ SOLN: 6.2500 mg | INTRAMUSCULAR | Status: DC | PRN

## 2012-06-24 MED ORDER — MOXIFLOXACIN HCL IN NACL 400 MG/250ML IV SOLN
400.0000 mg | INTRAVENOUS | Status: DC
  Administered 2012-06-24 – 2012-06-28 (×5): 400 mg via INTRAVENOUS
  Filled 2012-06-24 (×6): qty 250

## 2012-06-24 MED ORDER — PHENYLEPHRINE HCL 10 MG/ML IJ SOLN
INTRAMUSCULAR | Status: DC | PRN
  Administered 2012-06-24 (×3): 80 ug via INTRAVENOUS

## 2012-06-24 MED ORDER — LACTATED RINGERS IV SOLN
INTRAVENOUS | Status: DC
  Administered 2012-06-24: 18:00:00 via INTRAVENOUS

## 2012-06-24 MED ORDER — FENTANYL CITRATE 0.05 MG/ML IJ SOLN
INTRAMUSCULAR | Status: DC | PRN
  Administered 2012-06-24 (×2): 50 ug via INTRAVENOUS

## 2012-06-24 MED ORDER — ONDANSETRON HCL 4 MG/2ML IJ SOLN
INTRAMUSCULAR | Status: DC | PRN
  Administered 2012-06-24: 4 mg via INTRAVENOUS

## 2012-06-24 MED ORDER — FENTANYL CITRATE 0.05 MG/ML IJ SOLN: 25.0000 ug | INTRAMUSCULAR | Status: DC | PRN

## 2012-06-24 MED ORDER — 0.9 % SODIUM CHLORIDE (POUR BTL) OPTIME
TOPICAL | Status: DC | PRN
  Administered 2012-06-24: 1000 mL

## 2012-06-24 MED ORDER — MIDAZOLAM HCL 5 MG/5ML IJ SOLN
INTRAMUSCULAR | Status: DC | PRN
  Administered 2012-06-24 (×2): 1 mg via INTRAVENOUS

## 2012-06-24 SURGICAL SUPPLY — 17 items
BRUSH CYTOL CELLEBRITY 1.5X140 (MISCELLANEOUS) IMPLANT
CANISTER SUCTION 2500CC (MISCELLANEOUS) ×2 IMPLANT
CLOTH BEACON ORANGE TIMEOUT ST (SAFETY) ×2 IMPLANT
CONT SPEC 4OZ CLIKSEAL STRL BL (MISCELLANEOUS) ×4 IMPLANT
COVER TABLE BACK 60X90 (DRAPES) ×2 IMPLANT
FORCEPS BIOP RJ4 1.8 (CUTTING FORCEPS) IMPLANT
KIT ROOM TURNOVER OR (KITS) ×2 IMPLANT
MARKER SKIN DUAL TIP RULER LAB (MISCELLANEOUS) ×2 IMPLANT
NEEDLE BIOPSY TRANSBRONCH 21G (NEEDLE) IMPLANT
NS IRRIG 1000ML POUR BTL (IV SOLUTION) ×2 IMPLANT
OIL SILICONE PENTAX (PARTS (SERVICE/REPAIRS)) ×2 IMPLANT
SPONGE GAUZE 4X4 12PLY (GAUZE/BANDAGES/DRESSINGS) ×2 IMPLANT
SYR 20ML ECCENTRIC (SYRINGE) ×2 IMPLANT
TOWEL OR 17X24 6PK STRL BLUE (TOWEL DISPOSABLE) ×2 IMPLANT
TRAP SPECIMEN MUCOUS 40CC (MISCELLANEOUS) ×4 IMPLANT
TUBE CONNECTING 12X1/4 (SUCTIONS) ×2 IMPLANT
WATER STERILE IRR 1000ML POUR (IV SOLUTION) ×2 IMPLANT

## 2012-06-24 NOTE — Progress Notes (Signed)
Pt off floor for bronchoscopy. VSS. Family notified. Consent signed. Will f/u when pt returns.

## 2012-06-24 NOTE — Anesthesia Postprocedure Evaluation (Signed)
Anesthesia Post Note  Patient: [name] [name]  Procedure(s) Performed: Procedure(s) (LRB): VIDEO BRONCHOSCOPY (N/A)  Anesthesia type: General  Patient location: PACU  Post pain: Pain level controlled and Adequate analgesia  Post assessment: Post-op Vital signs reviewed, Patient's Cardiovascular Status Stable, Respiratory Function Stable, Patent Airway and Pain level controlled  Last Vitals:  Filed Vitals:   06/24/12 1501  BP:   Pulse: 98  Temp:   Resp: 25    Post vital signs: Reviewed and stable  Level of consciousness: awake, alert  and oriented  Complications: No apparent anesthesia complications

## 2012-06-24 NOTE — Procedures (Signed)
                   [address]            [name] [name] [address]          [phone]     Bronchoscopy Procedure Note [name] [name] [id number] 01-09-1936  Procedure: Bronchoscopy Indications: Obtain specimens for culture and/or other diagnostic studies  Procedure Details Consent: Risks of procedure as well as the alternatives and risks of each were explained to the (patient/caregiver).  Consent for procedure obtained. Time Out: Verified patient identification, verified procedure, site/side was marked, verified correct patient position, special equipment/implants available, medications/allergies/relevent history reviewed, required imaging and test results available.  preformed  In preparation for procedure, patient was given 100% FiO2 and bronchoscope lubricated. Sedation: MAC   Airway entered and the following bronchi were examined: RUL, RML, RLL and LLL.   Procedures performed: washing and cultures , left upper bronchus stump  intact   Evaluation Hemodynamic Status: BP stable throughout; O2 sats: transiently fell during during procedure Patient's Current Condition: stable Specimens:  Washing culture LLL Complications: No apparent complications Patient  tolerate procedure well.   [name] [name],[name] [name] 06/24/2012

## 2012-06-24 NOTE — Progress Notes (Signed)
Pt returned to unit from having bronchoscopy. VSS. Pt is alert and oriented X 4. No complaint of pain. Family is at bedside. Will continue to monitor pt.

## 2012-06-24 NOTE — Transfer of Care (Signed)
Immediate Anesthesia Transfer of Care Note  Patient: [name] [name]  Procedure(s) Performed: Procedure(s) (LRB): VIDEO BRONCHOSCOPY (N/A)  Patient Location: PACU  Anesthesia Type: MAC  Level of Consciousness: awake, alert  and oriented  Airway & Oxygen Therapy: Patient Spontanous Breathing and Patient connected to face mask oxygen  Post-op Assessment: Report given to PACU RN and Post -op Vital signs reviewed and stable  Post vital signs: Reviewed and stable  Complications: No apparent anesthesia complications

## 2012-06-24 NOTE — Anesthesia Procedure Notes (Signed)
Procedure Name: MAC Date/Time: 06/24/2012 2:09 PM Performed by: Sheppard Evens Pre-anesthesia Checklist: Patient identified, Emergency Drugs available, Suction available, Patient being monitored and Timeout performed Patient Re-evaluated:Patient Re-evaluated prior to inductionOxygen Delivery Method: Simple face mask

## 2012-06-24 NOTE — Anesthesia Preprocedure Evaluation (Signed)
Anesthesia Evaluation  Patient identified by MRN, date of birth, ID band Patient awake    Reviewed: Allergy & Precautions, H&P , NPO status , Patient's Chart, lab work & pertinent test results  History of Anesthesia Complications Negative for: history of anesthetic complications  Airway Mallampati: II  Neck ROM: Full    Dental   Pulmonary  Lung mass, s/p thoracotomy   + decreased breath sounds      Cardiovascular + Valvular Problems/Murmurs Rhythm:Regular Rate:Normal + Systolic murmurs    Neuro/Psych  Neuromuscular disease    GI/Hepatic   Endo/Other    Renal/GU      Musculoskeletal   Abdominal   Peds  Hematology   Anesthesia Other Findings   Reproductive/Obstetrics                           Anesthesia Physical Anesthesia Plan  ASA: III  Anesthesia Plan: MAC   Post-op Pain Management:    Induction: Intravenous  Airway Management Planned: Nasal Cannula  Additional Equipment:   Intra-op Plan:   Post-operative Plan:   Informed Consent: I have reviewed the patients History and Physical, chart, labs and discussed the procedure including the risks, benefits and alternatives for the proposed anesthesia with the patient or authorized representative who has indicated his/her understanding and acceptance.     Plan Discussed with: CRNA and Surgeon  Anesthesia Plan Comments:         Anesthesia Quick Evaluation

## 2012-06-24 NOTE — Progress Notes (Addendum)
[name]            [place] [address]          [phone]     8 Days Post-Op  Procedure(s) (LRB): VIDEO BRONCHOSCOPY (N/A) LYMPH NODE DISSECTION () THORACOTOMY/LOBECTOMY (Left) Subjective: Feels some degree of DOE, mild nonproductive cough  Objective  Telemetry bigemminal PVC's   Temp:  [98 F (36.7 C)-102.2 F (39 C)] 101.2 F (38.4 C) (06/26 0721) Pulse Rate:  [80-110] 80  (06/26 0320) Resp:  [20-28] 21  (06/26 0320) BP: (110-153)/(57-80) 120/57 mmHg (06/26 0320) SpO2:  [93 %-100 %] 97 % (06/26 0320)   Intake/Output Summary (Last 24 hours) at 06/24/12 0752 Last data filed at 06/23/12 2300  Gross per 24 hour  Intake    240 ml  Output   1310 ml  Net  -1070 ml       General appearance: alert, cooperative and no distress Heart: regular rate and rhythm Lungs: dim L base> right base Abdomen: mild distension, non tender Extremities: no edema Wound: incision healing well, rash slightly better  Lab Results:  Basename 06/24/12 0330 06/22/12 0500  NA 133* 138  K 4.2 3.5  CL 99 105  CO2 27 25  GLUCOSE 141* 447*  BUN 17 16  CREATININE 0.87 0.85  CALCIUM 8.3* 7.6*  MG -- --  PHOS -- --   No results found for this basename: AST:2,ALT:2,ALKPHOS:2,BILITOT:2,PROT:2,ALBUMIN:2 in the last 72 hours No results found for this basename: LIPASE:2,AMYLASE:2 in the last 72 hours  Basename 06/24/12 0330 06/22/12 0500  WBC 11.4* 5.1  NEUTROABS -- --  HGB 9.9* 9.3*  HCT 29.4* 28.0*  MCV 89.9 91.8  PLT 206 163   No results found for this basename: CKTOTAL:4,CKMB:4,TROPONINI:4 in the last 72 hours No components found with this basename: POCBNP:3 No results found for this basename: DDIMER in the last 72 hours No results found for this basename: HGBA1C in the last 72 hours No results found for this basename: CHOL,HDL,LDLCALC,TRIG,CHOLHDL in the last 72 hours No results found for this basename: TSH,T4TOTAL,FREET3,T3FREE,THYROIDAB in  the last 72 hours No results found for this basename: VITAMINB12,FOLATE,FERRITIN,TIBC,IRON,RETICCTPCT in the last 72 hours  Medications: Scheduled    . aspirin EC  81 mg Oral Daily  . bisacodyl  10 mg Oral Daily  . carvedilol  6.25 mg Oral BID WC  . enoxaparin (LOVENOX) injection  30 mg Subcutaneous Q12H  . finasteride  5 mg Oral Daily  . isosorbide mononitrate  30 mg Oral Daily  . nystatin ointment   Topical BID  . ramipril  5 mg Oral Daily  . sodium chloride  10 mL Intravenous Q12H  . Tamsulosin HCl  0.4 mg Oral QPC supper  . DISCONTD: levalbuterol  0.63 mg Nebulization TID     Radiology/Studies:  Dg Chest 2 View  06/24/2012  *RADIOLOGY REPORT*  Clinical Data: Chest tube removal  CHEST - 2 VIEW  Comparison:   the previous day's study  Findings: The left chest tube has been removed.  No pneumothorax. There is persistent left layering pleural effusion or pleural thickening.  Right subclavian central line stable.  Airspace consolidation at the left lung base and some patchy airspace disease in the right lung base is stable.  IMPRESSION:  Left chest tube removal without pneumothorax.  Original Report Authenticated By: Osa Craver, M.D.   Dg Chest Portable 1  View  06/23/2012  *RADIOLOGY REPORT*  Clinical Data: Chest tube evaluation.  PORTABLE CHEST - 1 VIEW  Comparison: 06/22/2012  Findings: Small left apical pneumothorax, unchanged.  Left chest tube is in stable position as is a right central line. Cardiomegaly with left lower lobe airspace opacity and small left effusion.  No confluent opacity on the right.  IMPRESSION: No significant change.  Original Report Authenticated By: Cyndie Chime, M.D.    INR: Will add last result for INR, ABG once components are confirmed Will add last 4 CBG results once components are confirmed  Assessment/Plan: S/P Procedure(s) (LRB): VIDEO BRONCHOSCOPY (N/A) LYMPH NODE DISSECTION () THORACOTOMY/LOBECTOMY (Left)  1. Fevers , trending  leukocytosis, check cultures, start avelox IV 2 increase coreg dose    LOS: 8 days    [name] [name],[name] [name] 6/26/20137:52 AM  Chest xray reviewed, with increased left atelectastis, will proceed with Bronchoscopy for pulmonary toilet and culture  The goals risks and alternatives of the planned surgical procedure bronchoscopy  have been discussed with the patient in detail. The risks of the procedure including death, infection, stroke, myocardial infarction, bleeding, blood transfusion have all been discussed specifically.  I have quoted [name] [name] a 1 % of perioperative mortality and a complication rate as high as 10 %. The patient's questions have been answered.[name] [name] is willing  to proceed with the planned procedure.  I have seen and examined [name] [name] and agree with the above assessment  and plan.  Delight Ovens MD Beeper [phone] Office [phone] 06/24/2012 9:08 AM

## 2012-06-25 ENCOUNTER — Inpatient Hospital Stay (HOSPITAL_COMMUNITY): Payer: Medicare Other

## 2012-06-25 LAB — CBC
HCT: 29.3 % — ABNORMAL LOW (ref 39.0–52.0)
Hemoglobin: 9.6 g/dL — ABNORMAL LOW (ref 13.0–17.0)
MCH: 29.7 pg (ref 26.0–34.0)
MCHC: 32.8 g/dL (ref 30.0–36.0)
RDW: 13.8 % (ref 11.5–15.5)

## 2012-06-25 LAB — BASIC METABOLIC PANEL
BUN: 24 mg/dL — ABNORMAL HIGH (ref 6–23)
Calcium: 8.7 mg/dL (ref 8.4–10.5)
Creatinine, Ser: 1.06 mg/dL (ref 0.50–1.35)
GFR calc Af Amer: 78 mL/min — ABNORMAL LOW (ref >90)
GFR calc non Af Amer: 67 mL/min — ABNORMAL LOW (ref >90)
Glucose, Bld: 108 mg/dL — ABNORMAL HIGH (ref 70–99)

## 2012-06-25 MED ORDER — CARVEDILOL 12.5 MG PO TABS
12.5000 mg | ORAL_TABLET | Freq: Two times a day (BID) | ORAL | Status: DC
  Administered 2012-06-27 – 2012-06-29 (×5): 12.5 mg via ORAL
  Filled 2012-06-25 (×8): qty 1

## 2012-06-25 NOTE — Progress Notes (Addendum)
1 Day Post-Op Procedure(s) (LRB): VIDEO BRONCHOSCOPY (N/A)'  Subjective: Patient with low grade fever this morning, + productive cough  Objective: Vital signs in last 24 hours: Temp:  [97.7 F (36.5 C)-102.4 F (39.1 C)] 99.2 F (37.3 C) (06/27 0750) Pulse Rate:  [47-102] 64  (06/27 0325) Cardiac Rhythm:  [-] Normal sinus rhythm (06/27 0325) Resp:  [17-29] 18  (06/27 0325) BP: (82-118)/(38-57) 102/57 mmHg (06/27 0325) SpO2:  [90 %-100 %] 96 % (06/27 0325) FiO2 (%):  [6 %] 6 % (06/26 1449)  Intake/Output from previous day: 06/26 0701 - 06/27 0700 In: 740 [I.V.:740] Out: 800 [Urine:800]   General appearance: alert, cooperative and no distress Heart: regular rate and rhythm Lungs: diminished breath sounds on left Abdomen: soft, non-tender; bowel sounds normal; no masses,  no organomegaly Wound: clean and dry  Lab Results:  Basename 06/25/12 0415 06/24/12 1100  WBC 13.7* 13.6*  HGB 9.6* 9.6*  HCT 29.3* 29.0*  PLT 229 222   BMET:  Basename 06/25/12 0415 06/24/12 0330  NA 134* 133*  K 4.1 4.2  CL 99 99  CO2 27 27  GLUCOSE 108* 141*  BUN 24* 17  CREATININE 1.06 0.87  CALCIUM 8.7 8.3*    PT/INR:  Basename 06/24/12 1100  LABPROT 15.6*  INR 1.21   ABG    Component Value Date/Time   PHART 7.415 06/17/2012 0519   HCO3 25.0* 06/17/2012 0519   TCO2 26 06/17/2012 0519   ACIDBASEDEF 5.0* 06/16/2012 1205   O2SAT 97.0 06/17/2012 0519   CBG (last 3)  No results found for this basename: GLUCAP:3 in the last 72 hours  Assessment/Plan: S/P Procedure(s) (LRB): VIDEO BRONCHOSCOPY (N/A)  1. Bronchoscopy done yesterday + GS for GPR, GNR, GPC, official culture report pending, will cont IV Avelox for now 2. Leukocytosis, febrile- will continue to follow, cultures are pending 3. Pulm- pleural effusion on CXR, + atelectasis, patient not on oxygen, continue IS 4. Ambulate 5. Dispo- awaiting culture results, will change antibiotic coverage based on sensitivities.   LOS: 9 days      [name], [name] 06/25/2012   Wounds intact, some effusion on chest xray this morning Culture pending adjust pend culture D/c central line as poss source of infection I have seen and examined [name] [name] and agree with the above assessment  and plan.  Delight Ovens MD Beeper [phone] Office [phone] 06/25/2012 9:38 AM

## 2012-06-26 ENCOUNTER — Inpatient Hospital Stay (HOSPITAL_COMMUNITY): Payer: Medicare Other

## 2012-06-26 ENCOUNTER — Encounter (HOSPITAL_COMMUNITY): Payer: Self-pay | Admitting: Cardiothoracic Surgery

## 2012-06-26 DIAGNOSIS — M7989 Other specified soft tissue disorders: Secondary | ICD-10-CM

## 2012-06-26 LAB — BASIC METABOLIC PANEL
BUN: 21 mg/dL (ref 6–23)
CO2: 26 mEq/L (ref 19–32)
Calcium: 8.4 mg/dL (ref 8.4–10.5)
Chloride: 97 mEq/L (ref 96–112)
Creatinine, Ser: 0.95 mg/dL (ref 0.50–1.35)
GFR calc Af Amer: >90 mL/min (ref >90)
GFR calc non Af Amer: 80 mL/min — ABNORMAL LOW (ref >90)
Glucose, Bld: 118 mg/dL — ABNORMAL HIGH (ref 70–99)
Potassium: 3.8 mEq/L (ref 3.5–5.1)
Sodium: 134 mEq/L — ABNORMAL LOW (ref 135–145)

## 2012-06-26 LAB — CULTURE, RESPIRATORY W GRAM STAIN

## 2012-06-26 LAB — CBC
HCT: 27.7 % — ABNORMAL LOW (ref 39.0–52.0)
Hemoglobin: 9.3 g/dL — ABNORMAL LOW (ref 13.0–17.0)
MCH: 30.3 pg (ref 26.0–34.0)
MCHC: 33.6 g/dL (ref 30.0–36.0)
MCV: 90.2 fL (ref 78.0–100.0)
Platelets: 240 10*3/uL (ref 150–400)
RBC: 3.07 MIL/uL — ABNORMAL LOW (ref 4.22–5.81)
RDW: 13.9 % (ref 11.5–15.5)
WBC: 10.5 10*3/uL (ref 4.0–10.5)

## 2012-06-26 LAB — URINE CULTURE: Culture  Setup Time: 201306270401

## 2012-06-26 MED ORDER — WHITE PETROLATUM GEL
Status: AC
  Filled 2012-06-26: qty 5

## 2012-06-26 NOTE — Progress Notes (Signed)
*  PRELIMINARY RESULTS* Vascular Ultrasound Lower extremity venous duplex has been completed.  Preliminary findings: Bilaterally no evidence of DVT  EUNICE, Delani Kohli RDMS 06/26/2012, 8:32 AM

## 2012-06-26 NOTE — Progress Notes (Signed)
Utilization review completed.  

## 2012-06-26 NOTE — Progress Notes (Signed)
Pt had 2 view XR & U/S bilat legs this am. Low grade fever this am. Pt c/o IV antibx. Will continue to monitor.

## 2012-06-26 NOTE — Progress Notes (Addendum)
[address]            [place] [address]          [phone]     2 Days Post-Op  Procedure(s) (LRB): VIDEO BRONCHOSCOPY (N/A) Subjective: Feels poorly, + BM, legs more comfortable than yesterday  Objective  Telemetry sinus with pvc's   Temp:  [99 F (37.2 C)-100.5 F (38.1 C)] 99 F (37.2 C) (06/28 0325) Pulse Rate:  [71-86] 86  (06/28 0325) Resp:  [18-24] 23  (06/27 1935) BP: (91-128)/(49-85) 128/85 mmHg (06/28 0325) SpO2:  [88 %-96 %] 93 % (06/28 0325)   Intake/Output Summary (Last 24 hours) at 06/26/12 0746 Last data filed at 06/26/12 0600  Gross per 24 hour  Intake   1820 ml  Output   1050 ml  Net    770 ml       General appearance: alert, cooperative and no distress Heart: S1, S2 normal and irreg Lungs: dim in bases, right basilar crackles Abdomen: + distension, + BS, soft, nontender Extremities: calves nontender, min edema Wound: incis healing well  Lab Results:  Basename 06/26/12 0448 06/25/12 0415  NA 134* 134*  K 3.8 4.1  CL 97 99  CO2 26 27  GLUCOSE 118* 108*  BUN 21 24*  CREATININE 0.95 1.06  CALCIUM 8.4 8.7  MG -- --  PHOS -- --   No results found for this basename: AST:2,ALT:2,ALKPHOS:2,BILITOT:2,PROT:2,ALBUMIN:2 in the last 72 hours No results found for this basename: LIPASE:2,AMYLASE:2 in the last 72 hours  Basename 06/26/12 0448 06/25/12 0415  WBC 10.5 13.7*  NEUTROABS -- --  HGB 9.3* 9.6*  HCT 27.7* 29.3*  MCV 90.2 90.7  PLT 240 229   No results found for this basename: CKTOTAL:4,CKMB:4,TROPONINI:4 in the last 72 hours No components found with this basename: POCBNP:3 No results found for this basename: DDIMER in the last 72 hours No results found for this basename: HGBA1C in the last 72 hours No results found for this basename: CHOL,HDL,LDLCALC,TRIG,CHOLHDL in the last 72 hours No results found for this basename: TSH,T4TOTAL,FREET3,T3FREE,THYROIDAB in the last 72 hours No results found for  this basename: VITAMINB12,FOLATE,FERRITIN,TIBC,IRON,RETICCTPCT in the last 72 hours  Medications: Scheduled    . aspirin EC  81 mg Oral Daily  . bisacodyl  10 mg Oral Daily  . carvedilol  12.5 mg Oral BID WC  . enoxaparin (LOVENOX) injection  30 mg Subcutaneous Q12H  . finasteride  5 mg Oral Daily  . isosorbide mononitrate  30 mg Oral Daily  . moxifloxacin  400 mg Intravenous Q24H  . nystatin ointment   Topical BID  . sodium chloride  10 mL Intravenous Q12H  . Tamsulosin HCl  0.4 mg Oral QPC supper  . white petrolatum      . DISCONTD: carvedilol  12.5 mg Oral BID WC  . DISCONTD: ramipril  5 mg Oral Daily     Radiology/Studies:  Dg Chest 2 View  06/25/2012  *RADIOLOGY REPORT*  Clinical Data: Follow-up after bronchoscopy.  Cough.  CHEST - 2 VIEW  Comparison: Chest x-ray 06/24/2012.  Findings: Compared to yesterday's examination there is an increasing left pleural effusion which appears to be partially loculated.  Postoperative changes of left upper lobectomy and superior segmentectomy in the left lower lobe are again noted. Right lung appears relatively well aerated.  Mild pulmonary venous congestion without frank pulmonary edema.  Heart size is  borderline enlarged. The patient is rotated to the right on today's exam, resulting in distortion of the mediastinal contours and reduced diagnostic sensitivity and specificity for mediastinal pathology. There is a right-sided subclavian central venous catheter with tip terminating in the proximal superior vena cava.  IMPRESSION: 1.  The interval increase in the loculated left-sided pleural effusion which is now moderate in size. 2.  Otherwise, the radiographic appearance of the chest is essentially unchanged, as above.  Original Report Authenticated By: Florencia Reasons, M.D.    INR: Will add last result for INR, ABG once components are confirmed Will add last 4 CBG results once components are confirmed  Assessment/Plan: S/P Procedure(s)  (LRB): VIDEO BRONCHOSCOPY (N/A)  1. Going for CXR , duplex LE's now 2  cultures pending, cont avelox, appears to be defervescing, leukocytosis improving 3  anemia fairly stable 4 pulm toilet, rehab     LOS: 10 days    GOLD,[name] 6/28/20137:46 AM    Dopplers of legs venous no DVT Off o2 Feels better Fu chest xray Continue antibiotics  I have seen and examined [name] and agree with the above assessment  and plan.  Delight Ovens MD Beeper [phone] Office [phone] 06/26/2012 2:49 PM

## 2012-06-27 ENCOUNTER — Inpatient Hospital Stay (HOSPITAL_COMMUNITY): Payer: Medicare Other

## 2012-06-27 LAB — BASIC METABOLIC PANEL
BUN: 15 mg/dL (ref 6–23)
CO2: 26 mEq/L (ref 19–32)
Calcium: 8.1 mg/dL — ABNORMAL LOW (ref 8.4–10.5)
Chloride: 101 mEq/L (ref 96–112)
Creatinine, Ser: 0.92 mg/dL (ref 0.50–1.35)
GFR calc Af Amer: >90 mL/min (ref >90)
GFR calc non Af Amer: 81 mL/min — ABNORMAL LOW (ref >90)
Glucose, Bld: 117 mg/dL — ABNORMAL HIGH (ref 70–99)
Potassium: 3.8 mEq/L (ref 3.5–5.1)
Sodium: 137 mEq/L (ref 135–145)

## 2012-06-27 LAB — CBC
HCT: 27.2 % — ABNORMAL LOW (ref 39.0–52.0)
Hemoglobin: 9 g/dL — ABNORMAL LOW (ref 13.0–17.0)
MCH: 29.5 pg (ref 26.0–34.0)
MCHC: 33.1 g/dL (ref 30.0–36.0)
MCV: 89.2 fL (ref 78.0–100.0)
Platelets: 272 10*3/uL (ref 150–400)
RBC: 3.05 MIL/uL — ABNORMAL LOW (ref 4.22–5.81)
RDW: 14 % (ref 11.5–15.5)
WBC: 8.2 10*3/uL (ref 4.0–10.5)

## 2012-06-27 MED ORDER — GUAIFENESIN 100 MG/5ML PO SOLN
5.0000 mL | ORAL | Status: DC | PRN
  Administered 2012-06-27 – 2012-06-29 (×4): 100 mg via ORAL
  Filled 2012-06-27 (×5): qty 5

## 2012-06-27 MED ORDER — POTASSIUM CHLORIDE CRYS ER 20 MEQ PO TBCR
20.0000 meq | EXTENDED_RELEASE_TABLET | Freq: Once | ORAL | Status: AC
  Administered 2012-06-27: 20 meq via ORAL
  Filled 2012-06-27: qty 1

## 2012-06-27 MED ORDER — LORATADINE 10 MG PO TABS
10.0000 mg | ORAL_TABLET | Freq: Every day | ORAL | Status: DC
  Administered 2012-06-27 – 2012-06-29 (×3): 10 mg via ORAL
  Filled 2012-06-27 (×3): qty 1

## 2012-06-27 NOTE — Progress Notes (Addendum)
3 Days Post-Op Procedure(s) (LRB): VIDEO BRONCHOSCOPY (N/A)  Subjective: His legs feel a little better.He does have complaints of a tickle in his throat and occasional productive cough, as well as constipation.  Objective: Vital signs in last 24 hours: Patient Vitals for the past 24 hrs:  BP Temp Temp src Pulse Resp SpO2  06/27/12 0805 125/64 mmHg 98.3 F (36.8 C) Oral 82  16  93 %  06/27/12 0335 126/69 mmHg 98.8 F (37.1 C) Oral 86  - 87 %  06/27/12 0116 - - - - - 96 %  06/26/12 2335 114/55 mmHg - - 84  - 91 %  06/26/12 2300 - 99.9 F (37.7 C) Oral - - -  06/26/12 1929 113/58 mmHg 98.9 F (37.2 C) Oral 84  16  94 %  06/26/12 1741 128/63 mmHg - - 82  22  94 %  06/26/12 1100 123/58 mmHg 99.1 F (37.3 C) Oral - - -      Intake/Output from previous day: 06/28 0701 - 06/29 0700 In: -  Out: 200 [Urine:200]   Physical Exam:  Cardiovascular: RRR, no murmurs, gallops, or rubs. Pulmonary: Clear to auscultation bilaterally; no rales, wheezes, or rhonchi. Abdomen: Soft, non tender, bowel sounds present. Extremities: Mild bilateral lower extremity edema. Wounds: Clean and dry.  No erythema or signs of infection. Chest Tube:  Lab Results: CBC: Basename 06/27/12 0517 06/26/12 0448  WBC 8.2 10.5  HGB 9.0* 9.3*  HCT 27.2* 27.7*  PLT 272 240   BMET:  Basename 06/27/12 0517 06/26/12 0448  NA 137 134*  K 3.8 3.8  CL 101 97  CO2 26 26  GLUCOSE 117* 118*  BUN 15 21  CREATININE 0.92 0.95  CALCIUM 8.1* 8.4    PT/INR:  Basename 06/24/12 1100  LABPROT 15.6*  INR 1.21   ABG:  INR: Will add last result for INR, ABG once components are confirmed Will add last 4 CBG results once components are confirmed  Assessment/Plan:  1. CV - SR. 2.  Pulmonary - CXR this am is stable (consolidation and atelectasis at left base).Final respiratory culture shows few WBC and gram positive rods.Rare gram positive cocci and negative rods.Continue Avelox for now. 3.Anemia-H and H stable at 9  and 27.2. 4.Supplement potassium. 5.Robitussin PRN at patient's request. 6.LOC constipation.  ZIMMERMAN,[name] 06/27/2012  patient examined and medical record reviewed,agree with above note. Check chest x-ray in a.m. VAN TRIGT III,Andreanna Mikolajczak 06/27/2012

## 2012-06-28 ENCOUNTER — Inpatient Hospital Stay (HOSPITAL_COMMUNITY): Payer: Medicare Other

## 2012-06-28 MED ORDER — CARVEDILOL 12.5 MG PO TABS: 12.5000 mg | ORAL_TABLET | Freq: Two times a day (BID) | ORAL | Status: DC

## 2012-06-28 MED ORDER — TRAMADOL HCL 50 MG PO TABS: 50.0000 mg | ORAL_TABLET | Freq: Four times a day (QID) | ORAL | Status: AC | PRN

## 2012-06-28 MED ORDER — ALPRAZOLAM 0.25 MG PO TABS: 0.2500 mg | ORAL_TABLET | Freq: Two times a day (BID) | ORAL | Status: DC | PRN

## 2012-06-28 MED ORDER — GUAIFENESIN 100 MG/5ML PO SOLN: 5.0000 mL | ORAL | Status: DC | PRN

## 2012-06-28 MED ORDER — DIPHENHYDRAMINE HCL 25 MG PO CAPS
25.0000 mg | ORAL_CAPSULE | Freq: Every evening | ORAL | Status: DC | PRN
  Administered 2012-06-29: 25 mg via ORAL
  Filled 2012-06-28: qty 1

## 2012-06-28 NOTE — Progress Notes (Addendum)
4 Days Post-Op Procedure(s) (LRB): VIDEO BRONCHOSCOPY (N/A)  Subjective: He is passing a lot of flatus but has not had a bowel movement yet. He wants to wait to receive another laxative as he feels he may be able to go soon.Does appear to have some anxiety.  Objective: Vital signs in last 24 hours: Patient Vitals for the past 24 hrs:  BP Temp Temp src Pulse Resp SpO2  06/28/12 0725 137/72 mmHg - - 74  21  98 %  06/28/12 0700 - 98.7 F (37.1 C) Oral - - -  06/28/12 0300 129/67 mmHg 98.8 F (37.1 C) Oral 72  15  98 %  06/27/12 2235 142/74 mmHg 100.6 F (38.1 C) Oral 82  20  97 %  06/27/12 1900 91/65 mmHg 98.7 F (37.1 C) Oral 76  16  95 %  06/27/12 1540 111/59 mmHg 98.9 F (37.2 C) Oral 71  16  94 %  06/27/12 1250 129/61 mmHg 99.1 F (37.3 C) Oral 88  16  94 %      Intake/Output from previous day: 06/29 0701 - 06/30 0700 In: 1330 [P.O.:1080; IV Piggyback:250] Out: 325 [Urine:325]   Physical Exam:  Cardiovascular: RRR, no murmurs, gallops, or rubs. Pulmonary: Diminished at left base; no rales, wheezes, or rhonchi. Abdomen: Soft, non tender, bowel sounds present. Extremities: Mild bilateral lower extremity edema. Wounds: Clean and dry. Some erythema around chest tube sites.   Lab Results: CBC:  Basename 06/27/12 0517 06/26/12 0448  WBC 8.2 10.5  HGB 9.0* 9.3*  HCT 27.2* 27.7*  PLT 272 240   BMET:   Basename 06/27/12 0517 06/26/12 0448  NA 137 134*  K 3.8 3.8  CL 101 97  CO2 26 26  GLUCOSE 117* 118*  BUN 15 21  CREATININE 0.92 0.95  CALCIUM 8.1* 8.4    PT/INR: No results found for this basename: LABPROT,INR in the last 72 hours ABG:  INR: Will add last result for INR, ABG once components are confirmed Will add last 4 CBG results once components are confirmed  Assessment/Plan:  1. CV - SR. 2.  Pulmonary - CXR this am is stable (consolidation, pleural effusion, and atelectasis at left base).Final respiratory culture shows few WBC and gram positive  rods.Rare gram positive cocci and negative rods.Continue Avelox for now. 3.Anemia-H and H stable at 9 and 27.2. 4.Xanax PRN. 5.Possible discharge in 1-2 days.   ZIMMERMAN,[name] 06/28/2012    patient examined and medical record reviewed,agree with above note. VAN TRIGT III,Fausto Sampedro 06/28/2012

## 2012-06-28 NOTE — Discharge Instructions (Signed)
ACTIVITY:  1.Increase activity slowly. 2.Walk daily and increase frequency and duration as tolerates. 3.May walk up steps. 4.No lifting more than ten pounds for two weeks. 5.No driving for two weeks. 6.Avoid straining. 7.STOP any activity that causes chest pain, shortness of breath, dizziness,sweating, or excessive weakness. 8.Continue with breathing exercises daily.  DIET:low fat and low sodium  Thoracotomy Care After Refer to this sheet in the next few weeks. These instructions provide you with information on caring for yourself after your procedure. Your caregiver may also give you more specific instructions. Your treatment has been planned according to current medical practices, but problems sometimes occur. Call your caregiver if you have any problems or questions after your procedure. HOME CARE INSTRUCTIONS  Remove the bandage (dressing) over your chest tube site as directed by your caregiver.   It is normal to be sore for a couple weeks following surgery. See your caregiver if this seems to be getting worse rather than better.   Only take over-the-counter or prescription medicines for pain, discomfort, or fever as directed by your caregiver. It is very important to take pain medicine when you need it so that you will cough and breathe deeply enough to clear mucus (phlegm) and expand your lungs. This helps prevent a lung infection (pneumonia).   If it hurts to cough, hold a pillow against your chest when you cough. This may help with the discomfort. In spite of the discomfort, cough frequently.   Taking deep breaths keeps lungs inflated and protects against pneumonia. Most patients will go home with a device called an incentive spirometer that encourages deep breathing.   You may resume a normal diet and activities as directed.   Use showers for bathing until you see your caregiver, or as instructed.   Change dressings if necessary or as directed.   Avoid lifting or driving  until you are instructed otherwise.   Make an appointment to see your caregiver for stitch (suture) or staple removal when instructed.   Do not travel by airplane for 2 weeks after the chest tube is removed.  SEEK MEDICAL CARE IF:  You are bleeding from your wounds.   Your heartbeat seems irregular.   You have redness, swelling, or increasing pain in the wounds.   There is pus coming from your wounds.   There is a bad smell coming from the wound or dressing.  SEEK IMMEDIATE MEDICAL CARE IF:  You have a fever.   You develop a rash.   You have difficulty breathing.   You develop any reaction or side effects to medicines given.   You develop lightheadedness or feel faint.   You develop shortness of breath or chest pain.  MAKE SURE YOU:  Understand these instructions.   Will watch your condition.   Will get help right away if you are not doing well or get worse.  Document Released: 05/31/2011 Document Revised: 12/05/2011 Document Reviewed: 05/31/2011 ExitCare Patient Information 2012 ExitCare, [place]. WOUND:  1.May shower. 2.Clean wounds with mild soap and water.  Call the office at [phone] if any    problems arise.

## 2012-06-29 ENCOUNTER — Encounter (HOSPITAL_COMMUNITY)
Admission: RE | Disposition: A | Discharge status: Home / Self Care | Payer: Self-pay | Source: Ambulatory Visit | Attending: Cardiothoracic Surgery

## 2012-06-29 SURGERY — BRONCHOSCOPY, VIDEO-ASSISTED
Anesthesia: General | Wound class: Clean Contaminated

## 2012-06-29 MED ORDER — MOXIFLOXACIN HCL 400 MG PO TABS: 400.0000 mg | ORAL_TABLET | Freq: Every day | ORAL | Status: DC

## 2012-06-29 SURGICAL SUPPLY — 17 items
BRUSH CYTOL CELLEBRITY 1.5X140 (MISCELLANEOUS) IMPLANT
CANISTER SUCTION 2500CC (MISCELLANEOUS) ×2 IMPLANT
CLOTH BEACON ORANGE TIMEOUT ST (SAFETY) ×2 IMPLANT
CONT SPEC 4OZ CLIKSEAL STRL BL (MISCELLANEOUS) ×4 IMPLANT
COVER TABLE BACK 60X90 (DRAPES) ×2 IMPLANT
FORCEPS BIOP RJ4 1.8 (CUTTING FORCEPS) IMPLANT
KIT ROOM TURNOVER OR (KITS) ×2 IMPLANT
MARKER SKIN DUAL TIP RULER LAB (MISCELLANEOUS) ×2 IMPLANT
NEEDLE BIOPSY TRANSBRONCH 21G (NEEDLE) IMPLANT
NS IRRIG 1000ML POUR BTL (IV SOLUTION) ×2 IMPLANT
OIL SILICONE PENTAX (PARTS (SERVICE/REPAIRS)) ×2 IMPLANT
SPONGE GAUZE 4X4 12PLY (GAUZE/BANDAGES/DRESSINGS) ×2 IMPLANT
SYR 20ML ECCENTRIC (SYRINGE) ×2 IMPLANT
TOWEL OR 17X24 6PK STRL BLUE (TOWEL DISPOSABLE) ×2 IMPLANT
TRAP SPECIMEN MUCOUS 40CC (MISCELLANEOUS) ×4 IMPLANT
TUBE CONNECTING 12X1/4 (SUCTIONS) ×2 IMPLANT
WATER STERILE IRR 1000ML POUR (IV SOLUTION) ×2 IMPLANT

## 2012-06-29 NOTE — Progress Notes (Signed)
D/C sutures, applied benzoin & steri strips. Reviewed d/c instructions w/ Pt and family. Pt and family indicated that they understood f/up appts, etc.

## 2012-06-29 NOTE — Progress Notes (Signed)
[address]            [place] [address]          [phone]     5 Days Post-Op  Procedure(s) (LRB): VIDEO BRONCHOSCOPY (N/A) Subjective: Cont to do well  Objective  Telemetry sinus, pvc's   Temp:  [98.1 F (36.7 C)-99.4 F (37.4 C)] 98.2 F (36.8 C) (07/01 0729) Pulse Rate:  [70-80] 70  (07/01 0406) Resp:  [16-23] 19  (07/01 0406) BP: (108-128)/(61-69) 108/63 mmHg (07/01 0406) SpO2:  [90 %-100 %] 100 % (07/01 0406)   Intake/Output Summary (Last 24 hours) at 06/29/12 0757 Last data filed at 06/29/12 0729  Gross per 24 hour  Intake    955 ml  Output    250 ml  Net    705 ml       General appearance: alert, cooperative and no distress Heart: regular rate and rhythm and occas extrasystole Lungs: mildly dim in bases Abdomen: mild distension Extremities: + LLE edema Wound: incis healing well  Lab Results:  Basename 06/27/12 0517  NA 137  K 3.8  CL 101  CO2 26  GLUCOSE 117*  BUN 15  CREATININE 0.92  CALCIUM 8.1*  MG --  PHOS --   No results found for this basename: AST:2,ALT:2,ALKPHOS:2,BILITOT:2,PROT:2,ALBUMIN:2 in the last 72 hours No results found for this basename: LIPASE:2,AMYLASE:2 in the last 72 hours  Basename 06/27/12 0517  WBC 8.2  NEUTROABS --  HGB 9.0*  HCT 27.2*  MCV 89.2  PLT 272   No results found for this basename: CKTOTAL:4,CKMB:4,TROPONINI:4 in the last 72 hours No components found with this basename: POCBNP:3 No results found for this basename: DDIMER in the last 72 hours No results found for this basename: HGBA1C in the last 72 hours No results found for this basename: CHOL,HDL,LDLCALC,TRIG,CHOLHDL in the last 72 hours No results found for this basename: TSH,T4TOTAL,FREET3,T3FREE,THYROIDAB in the last 72 hours No results found for this basename: VITAMINB12,FOLATE,FERRITIN,TIBC,IRON,RETICCTPCT in the last 72 hours  Medications: Scheduled    . aspirin EC  81 mg Oral Daily  . bisacodyl   10 mg Oral Daily  . carvedilol  12.5 mg Oral BID WC  . enoxaparin (LOVENOX) injection  30 mg Subcutaneous Q12H  . finasteride  5 mg Oral Daily  . isosorbide mononitrate  30 mg Oral Daily  . loratadine  10 mg Oral Daily  . moxifloxacin  400 mg Intravenous Q24H  . nystatin ointment   Topical BID  . sodium chloride  10 mL Intravenous Q12H  . Tamsulosin HCl  0.4 mg Oral QPC supper     Radiology/Studies:  Dg Chest 2 View  06/28/2012  *RADIOLOGY REPORT*  Clinical Data: Fever.  Status post VATS.  CHEST - 2 VIEW  Comparison: Multiple priors, most recently 06/27/2012.  Findings: Postoperative changes of left upper lobectomy and left lower lobe superior segmentectomy are again noted.  There is again a complex left-sided multilocular pleural effusions.  Perihilar and basilar opacities within the residual left lower lobe are similar to the recent prior study.  The right lung is well-aerated.  No right-sided pleural effusion.  Heart size is within normal limits. Mediastinal contours remain distorted on the left side.  IMPRESSION: 1.  Overall, the radiographic appearance of the chest is essentially unchanged, as above, compared to prior study from 06/27/2012.  Original Report Authenticated By: Florencia Reasons, M.D.  INR: Will add last result for INR, ABG once components are confirmed Will add last 4 CBG results once components are confirmed  Assessment/Plan: S/P Procedure(s) (LRB): VIDEO BRONCHOSCOPY (N/A) Plan for discharge: see discharge orders   LOS: 13 days    Lakindra Wible E 7/1/20137:57 AM

## 2012-06-30 ENCOUNTER — Encounter (HOSPITAL_COMMUNITY): Payer: Self-pay | Admitting: Cardiothoracic Surgery

## 2012-06-30 ENCOUNTER — Telehealth: Payer: Self-pay

## 2012-06-30 NOTE — Telephone Encounter (Signed)
Pt is currently taking Avelox 400 mg po every day. He has post-op appt sch'ed for 07/20/12 with PA. ( S/P Lt VATS, resection of Lt upper lobe on 06/19/12) He was instructed to call office if fevers cont. or are not managed with tylenol. He will start   checking his WT's daily and call is swelling increases or develops any problems with his incision sites.

## 2012-07-08 NOTE — Discharge Summary (Signed)
[address]            [place] [address]          [phone]      [name] [name] 1936/05/07 76 y.o. [id number]  06/16/2012 06/29/2012  No att. providers found  L LUNG MASS atelectasis  History of Present Illness:  Patient is a 76 year old male with good functional status who had noted some chronic cough over the past several months nonproductive and without hemoptysis recent visit to his dermatologists and discussion about the cough led to a chest x-ray and subsequently a CT scan and referral to Dr. Elnita Maxwell for a left lung mass.  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 Kathrynn Running in 2006. The patient has a family history of carcinoma of the lung he is a distant smoker quitting in 1967, been smoking for approximately 15 years. After fully evaluation including cardiology clearance the patient was felt to be a candidate for surgical resection and was admitted this hospitalization for the procedure.  Past Medical History   Diagnosis  Date   .  Lung mass    .  Squamous cell carcinoma, face      history of, right upper cheek in 2004   .  Skin cancer      Multiple skin cancers   .  BPH (benign prostatic hyperplasia)    .  Shingles    .  Heart murmur      as a child   .  Chronic kidney disease      kidney stones -small passed.   .  Arthritis    .  Trigger finger      Bilateral    Past Surgical History   Procedure  Date   .  Mohs surgery  2004   .  Excision ot metastatic lymph node followed by radiation  2006     .   Marland Kitchen  Cataract extraction, bilateral    .  Appendectomy    .  Cardiac catheterization  05/2012     Has blockage- Treated with Medications. Cardiologist - Dr Mayford Knife    Family History   Problem  Relation  Age of Onset   .  Lung cancer   Father    .  Psoriasis  Child    .  Arthritis  Child    .  Heart disease  Mother    patient has one son who developed sudden cardiac arrest and was thought to have Brugada syndrome.  History   Smoking status   .  Former Smoker   .  Types:  Cigarettes   .  Quit date:  12/30/1965   Smokeless tobacco   .  Never Used    History   Alcohol Use   .  Yes     infrequently- "indulge heavly when I do"    Allergies   Allergen  Reactions   .  Lipitor (Atorvastatin)  Other (See Comments)     Muscle pain    Current Outpatient Prescriptions   Medication  Sig  Dispense  Refill   .  aspirin 81 MG tablet  Take 81 mg by mouth daily.     Marland Kitchen  B Complex Vitamins (B COMPLEX 1) tablet  Take 1 tablet by mouth daily.     .  carvedilol (COREG) 3.125 MG tablet  Take 1 tablet (3.125 mg total) by mouth 2 (two) times daily with a meal.  60 tablet  11   .  Cholecalciferol (VITAMIN D3) 1000 UNITS CAPS  Take 1,000 Units by mouth daily.     .  finasteride (PROSCAR) 5 MG tablet  Take 5 mg by mouth Daily.     .  isosorbide mononitrate (IMDUR) 30 MG 24 hr tablet  Take 1 tablet (30 mg total) by mouth daily.  30 tablet  11   .  Multiple Vitamins-Minerals (CENTRUM PO)  Take 1 tablet by mouth daily.     Ailene Ards 3-6-9 Fatty Acids (TRIPLE OMEGA COMPLEX PO)  Take 1 capsule by mouth daily.     .  ramipril (ALTACE) 2.5 MG capsule  Take 1 capsule (2.5 mg total) by mouth daily.  30 capsule  11   .  Tamsulosin HCl (FLOMAX) 0.4 MG CAPS  Take 0.4 mg by mouth Daily.      No current facility-administered medications for this visit.   Review of Systems: At time of consultation Cardiac Review of Systems: Y or N  Chest Pain [ n ] Resting SOB [n ] Exertional SOB [ n ] Orthopnea [n ]  Pedal Edema [ n ] Palpitations [n ] Syncope [n ] Presyncope [ n ]  General Review of Systems: [Y] = yes [ ] =no  Constitional: recent weight change Cove.Etienne ]; anorexia [ ] ; fatigue [ ] ; nausea [ ] ; night sweats [ ] ; fever [ n ]; or chills [ n ];  Dental:  poor dentition[ n ]; Last Dentist visit:partial plates  Eye : blurred vision [ ] ; diplopia [ ] ; vision changes [ ] ; Amaurosis fugax[ ] ;  Resp: cough [ ] ; wheezing[ ] ; hemoptysis[ ] ; shortness of breath[ ] ; paroxysmal nocturnal dyspnea[ ] ; dyspnea on exertion[ ] ; or orthopnea[ ] ;  GI: gallstones[ ] , vomiting[ ] ; dysphagia[ ] ; melena[ ] ; hematochezia [ ] ; heartburn[ ] ; Hx of Colonoscopy[ ] ;  GU: kidney stones [ ] ; hematuria[n ]; dysuria [ ] ; nocturia[ 1 time ]; history of obstruction [n ];  Skin: rash, swelling[ ] ;, hair loss[ ] ; peripheral edema[ ] ; or itching[ ] ;  Musculosketetal: myalgias[ ] ; joint swelling[mild ]; joint erythema[ ] ;  joint pain[ ] ; back pain[ ] ;  Heme/Lymph: bruising[ ] ; bleeding[ ] ; anemia[ ] ;  Neuro: TIA[ ] ; headaches[ ] ; stroke[ ] ; vertigo[ ] ; seizures[ ] ; paresthesias[ ] ; difficulty walking[ ] ;  Psych:depression[ ] ; anxiety[ ] ;  Endocrine: diabetes[ ] ; thyroid dysfunction[ ] ;  Immunizations: Flu Cove.Etienne ]; Pneumococcal[y ];  Other:  Physical Exam: At time of consultation BP 141/80  Pulse 80  Resp 18  Ht 5\' 4"  (1.626 m)  Wt 168 lb (76.204 kg)  BMI 28.84 kg/m2  SpO2 97%  General appearance: alert, cooperative and appears stated age  Neurologic: intact  Heart: regular rate and rhythm, S1, S2 normal, no murmur, click, rub or gallop and normal apical impulse  Lungs: clear to auscultation bilaterally and normal percussion bilaterally  Abdomen: soft, non-tender; bowel sounds normal; no masses, no organomegaly  Extremities: extremities normal, atraumatic, no cyanosis or edema and Homans sign is negative, no sign of DVT  The patient has no cervical or supraclavicular adenopathy he does have some erythematous skin changes in the right neck which she relates to his previous radiation treatment  Patient has no carotid bruits, femoral pulses popliteal DP  and PT pulses are all full and intact.  Diagnostic Studies & Laboratory data:  Recent Radiology Findings:  Ct Chest W Contrast    05/05/2012 *RADIOLOGY REPORT* Clinical Data: History of squamous cell carcinoma of the right side the face. Question left hilar adenopathy. CT CHEST WITH CONTRAST Technique: Multidetector CT imaging of the chest was performed following the standard protocol during bolus administration of intravenous contrast. Contrast: 80mL OMNIPAQUE IOHEXOL 300 MG/ML SOLN Comparison: Plain film of 02/26/2012. Most recent chest CT of 10/29/2005. Findings: Lung windows demonstrate mild right apical scarring. Posterior left upper lobe lung nodule, corresponding to the plain film abnormality. This measures 2.4 x 2.6 cm on transverse image 28 and 2.8 cm on sagittal image 67. This contacts the left major fissure, and extension into the superior segment left lower lobe cannot be excluded (also sagittal image 67). The lesion is positioned immediately adjacent to the descending aorta, including on image 28 of series 2. Contacts the left upper lobe bronchus. Soft tissue windows demonstrate no supraclavicular adenopathy. Heart size upper normal. Multivessel coronary artery atherosclerosis. No pericardial or pleural effusion. Small middle mediastinal nodes, without adenopathy. A left paratracheal node measures 7 mm on image 26 versus 5 mm on the prior. No hilar adenopathy. Tiny hiatal hernia Limited abdominal imaging demonstrates normal adrenal glands. No significant findings. Similar bone island within the upper thoracic spine on image 16. IMPRESSION: Plain film abnormality corresponds to a posterior left upper lobe lung nodule, consistent with primary bronchogenic carcinoma. Although this contacts the left major fissure and is immediately adjacent the aorta, there is no evidence of nodal metastasis. By imaging, presuming non-small cell histology, T1bN0M0 or stage IIA These results will be called to the ordering clinician or representative by the Radiologist Assistant, and communication documented in the PACS Dashboard. Original Report  Authenticated By: Consuello Bossier, M.D.  Nm Pet Image Initial (pi) Skull Base To Thigh  05/18/2012 *RADIOLOGY REPORT* Clinical Data: Initial treatment strategy for posterior left upper lobe lung nodule suspicious for primary bronchogenic carcinoma. NUCLEAR MEDICINE PET CT SKULL BASE TO THIGH Technique: Technique: 14.6 mCi F-18 FDG was injected intravenously. CT data was obtained and used for attenuation correction and anatomic localization only. (This was not acquired as a diagnostic CT examination.) Additional exam technical data entered on technologist worksheet. Comparison: Chest CT 05/05/2012. Prior PET of 04/29/2005. Findings: Neck: No abnormal hypermetabolism. Chest: Hypermetabolism corresponding to the previously described posterior left upper lobe lung nodule. This measures 2.7 cm and a S.U.V. max of 15.4 on image 78. No abnormal thoracic nodal activity. Abdomen/Pelvis: Likely physiologic hypermetabolism about the right side of the anus, no CT correlate. [place]: No hypermetabolism to suggest osseous metastasis. CT images performed for attenuation correction demonstrate surgical changes within the right side of the neck. Right-sided thyroid atrophy. Chest findings deferred to recent diagnostic chest CT. Coronary artery atherosclerosis. No acute superimposed process. Normal adrenal glands. Left greater than right renal collecting systems stones. Right common iliac artery aneurysm, 1.6 cm on image 176. Mild prostatomegaly. Probable bone island in the sacrum. Scattered sclerotic lesions within the thoracic spine are also likely bone islands. IMPRESSION: 1. Hypermetabolic posterior left upper lobe lung nodule, most consistent with primary bronchogenic carcinoma. No evidence of thoracic nodal or extrathoracic hypermetabolic disease. Presuming non-small cell histology, T1bN0M0 or stage IIA disease. 2. Surgical changes in the right side of the neck, without hypermetabolic residual or recurrent disease. 3. Right  common iliac artery aneurysm. 4. Renal calculi. Original Report Authenticated By: Karn Cassis.  Reche Dixon, M.D.  Cath Results:  Filed:  06/02/12 1240  Note Time:  06/02/12 1159          PROCEDURE: Left heart catheterization with selective coronary angiography, left ventriculogram.  INDICATIONS:  The risks, benefits, and details of the procedure were explained to the patient. The patient verbalized understanding and wanted to proceed. Informed written consent was obtained.  PROCEDURE TECHNIQUE: After Xylocaine anesthesia a 91F sheath was placed in the right femoral artery with a single anterior needle wall stick. Left coronary angiography was done using a Judkins L4 guide catheter. Right coronary angiography was done using a Judkins R4 guide catheter. Left ventriculography was done using a pigtail catheter.  CONTRAST: Total of 60cc.  COMPLICATIONS: None.  HEMODYNAMICS: Aortic pressure was 131/43mmHg; LV pressure was 122/54mmHg; LVEDP . There was no gradient between the left ventricle and aorta.  ANGIOGRAPHIC DATA: The left main coronary artery is widely patent and bifurcates into an LAD and left circumflex arteries.  The left anterior descending artery is widely patent in its proximal portion and gives rise to a first diagonal. The diagonal has a 80-90% ostial stenosis before bifurcating into a superior and inferior branches. The superior branch is very small. The inferior branch bifurcates into a superior and inferior branch which are moderate in caliber and patent. The LAD is patent after the first diagonal.  The left circumflex artery is widely patent and gives rise to a small to moderate OM1 which is patent. It then gives rise to a moderate sized OM2 which is patent. Distally it gives rise to an OM3 which is large and has a 70-80% stenosis in the proximal portion. The left circumflex has a 90% stenosis and tapers down to a very small caliber after the takeoff of the OM3.  The right coronary artery is  widely patent with luminal irregularities and distally bifurcates into a PDA and PL brnaches which are patent.  LEFT VENTRICULOGRAM: Left ventricular angiogram was done in the 30 RAO projection and revealed normal left ventricular wall motion and systolic function with an estimated ejection fraction of 30%. LVEDP was 15 mmHg.  IMPRESSIONS:  1. Normal left main coronary artery. 2. Normal left anterior descending artery with 80-90% ostial stenosis of a trifurcating diagonal 1. 3. Normal left circumflex artery in proximal and mid vessel with a 70-80% stenosis of the OM3 and 90% stenosis of the left circumflex after the OM3. 4. Normal right coronary artery with luminal irregularities. 5. Normal left ventricular systolic function. LVEDP . Ejection fraction 30% RECOMMENDATION:  1. Films reviewed with Dr. Eldridge Dace. The diagonal is too small for PCI and therefore will be treated with nitrates. I will start Imdur 30mg  daily. He will also be continued on ASA 81mg  daily. He has not had any chest pain or SOB with any type of activity so at this point I feel that the OM3 should be treated medically with nitrates.  2. Start Coreg 3.125mg  BID and Altace 2.5mg  daily for his cardiomyopathy  3. Followup with me in the office in 1 week      PFT'S:  FVC 3.26, FEV1 2.62, DLCO 77%    Hospital Course the patient was admitted and on 06/16/2012 he was taken to the operating room where he underwent the following procedure:   OPERATIVE REPORT  PREOPERATIVE DIAGNOSIS: Left upper lobe lung mass.  POSTOPERATIVE DIAGNOSES: Left upper lobe lung mass, squamous cell  carcinoma, non-small cell carcinoma by frozen section.  PROCEDURE PERFORMED: Video bronchoscopy, left video-assisted  thoracoscopy  with mini thoracotomy, resection of left upper lobe and  portion of superior segment left lower lobe and lymph node dissection.  SURGEON: Sheliah Plane, MD  FIRST ASSISTANT: Ines Bloomer, M.D.  SECOND ASSISTANT:  Rowe Clack, PA-C The patient tolerated the procedure well and was taken to the post anesthesia care unit in stable condition.  Post operative hospital course: The patient has progressed overall quite nicely. He did require return to the operating room on 06/24/2012 for bronchoscopy. At that time cultures and washings were obtained and evaluation of left upper bronchus stump showed it to be intact. Pathology did reveal the lesion to be squamous cell carcinoma. Cultures obtained and bronchoscopy were remarkable for being normal flora. He was treated with a course of Avelox do to fevers with pulmonary source felt to be most likely. Fevers did resolve over time. His chest tubes were discontinued in a routine manner over time. He did have an acute blood loss anemia with values have stabilized over time. Incision was found to be healing well without evidence of infection. Oxygen was weaned and he maintained adequate saturations on room air. He tolerating gradually increasing activities using standard protocols.      No results found for this basename: NA:2,K:2,CL:2,CO2:2,GLUCOSE:2,BUN:2,CRATININE:2,CALCIUM:2 in the last 72 hours No results found for this basename: WBC:2,HGB:2,HCT:2,PLT:2 in the last 72 hours No results found for this basename: INR:2 in the last 72 hours   Discharge Instructions:  The patient is discharged to home with extensive instructions on wound care and progressive ambulation.  They are instructed not to drive or perform any heavy lifting until returning to see the physician in his office.  Discharge Diagnosis:  L LUNG MASS atelectasis Squamous cell carcinoma of the left lung status post resection as described above Secondary Diagnosis: Patient Active Problem List  Diagnosis  . Lung cancer, upper lobe  . Squamous cell carcinoma, face  . Skin cancer  . BPH (benign prostatic hyperplasia)  . Shingles   Past Medical History  Diagnosis Date  . Lung mass   . Squamous cell  carcinoma, face     history of, right upper cheek in 2004   . Skin cancer     Multiple skin cancers   . BPH (benign prostatic hyperplasia)   . Shingles   . Heart murmur     as a child  . Chronic kidney disease     kidney stones -small passed.  . Arthritis   . Trigger finger     Bilateral         Home Medication Instructions      Medication Information                    finasteride (PROSCAR) 5 MG tablet Take 5 mg by mouth Daily.           Tamsulosin HCl (FLOMAX) 0.4 MG CAPS Take 0.4 mg by mouth Daily.           aspirin 81 MG tablet Take 81 mg by mouth daily.           Multiple Vitamins-Minerals (CENTRUM PO) Take 1 tablet by mouth daily.           Omega 3-6-9 Fatty Acids (TRIPLE OMEGA COMPLEX PO) Take 1 capsule by mouth daily.           B Complex Vitamins (B COMPLEX 1) tablet Take 1 tablet by mouth daily.           Cholecalciferol (VITAMIN D3)  1000 UNITS CAPS Take 1,000 Units by mouth daily.           isosorbide mononitrate (IMDUR) 30 MG 24 hr tablet Take 1 tablet (30 mg total) by mouth daily.           ramipril (ALTACE) 2.5 MG capsule Take 1 capsule (2.5 mg total) by mouth daily.           carvedilol (COREG) 12.5 MG tablet Take 1 tablet (12.5 mg total) by mouth 2 (two) times daily with a meal.           traMADol (ULTRAM) 50 MG tablet Take 1-2 tablets (50-100 mg total) by mouth every 6 (six) hours as needed for pain.           guaiFENesin (ROBITUSSIN) 100 MG/5ML SOLN Take 5 mLs (100 mg total) by mouth every 4 (four) hours as needed (cough).           moxifloxacin (AVELOX) 400 MG tablet Take 1 tablet (400 mg total) by mouth daily.             Disposition: Discharged home  Patient's condition is Good  Gershon Crane, PA-C

## 2012-07-16 ENCOUNTER — Other Ambulatory Visit: Payer: Self-pay | Admitting: Cardiothoracic Surgery

## 2012-07-16 DIAGNOSIS — R222 Localized swelling, mass and lump, trunk: Secondary | ICD-10-CM

## 2012-07-20 ENCOUNTER — Ambulatory Visit (INDEPENDENT_AMBULATORY_CARE_PROVIDER_SITE_OTHER): Payer: Self-pay | Admitting: Surgical

## 2012-07-20 ENCOUNTER — Ambulatory Visit
Admission: RE | Admit: 2012-07-20 | Discharge: 2012-07-20 | Disposition: A | Discharge status: Home / Self Care | Payer: Medicare Other | Source: Ambulatory Visit | Attending: Cardiothoracic Surgery | Admitting: Cardiothoracic Surgery

## 2012-07-20 ENCOUNTER — Telehealth: Payer: Self-pay | Admitting: *Deleted

## 2012-07-20 VITALS — BP 103/63 | HR 80 | Temp 96.8°F | Resp 16 | Ht 64.0 in | Wt 150.0 lb

## 2012-07-20 DIAGNOSIS — C3410 Malignant neoplasm of upper lobe, unspecified bronchus or lung: Secondary | ICD-10-CM

## 2012-07-20 NOTE — Patient Instructions (Signed)
Continue incentive spirometry Increase ambulation as tolerated May drive Discussed limitations regarding physical activities

## 2012-07-20 NOTE — Progress Notes (Signed)
PCP is Lillia Mountain, MD Referring Provider is Lillia Mountain, MD  Chief Complaint  Patient presents with  . Routine Post Op    3wk f/u   CXR [facility] LT VATS  [facility] 06/14/12    HPI: The patient returns on today's date for routine office visit following left upper lobectomy and left lower lobe superior segmentectomy for squamous cell carcinoma. Currently he reports he still has some difficulty with cough, which is nonproductive. He recently also saw his cardiologist who stopped his ace inhibitor due to this cough. He has had no recent fevers, chills or other constitutional symptoms. He does have some discomfort in his incision especially when he coughs. He is progressing well in regard to rehabilitation and has returned to driving.   Past Medical History  Diagnosis Date  . Lung mass   . Squamous cell carcinoma, face     history of, right upper cheek in 2004   . Skin cancer     Multiple skin cancers   . BPH (benign prostatic hyperplasia)   . Shingles   . Heart murmur     as a child  . Chronic kidney disease     kidney stones -small passed.  . Arthritis   . Trigger finger     Bilateral    Past Surgical History  Procedure Date  . Mohs surgery 2004  . Excision ot metastatic lymph node followed by radiation 2006    .  Marland Kitchen Cataract extraction, bilateral   . Appendectomy   . Cardiac catheterization 05/2012    Has blockage- Treated with Medications.  Cardiologist - Dr Mayford Knife  . Video bronchoscopy 06/16/2012    Procedure: VIDEO BRONCHOSCOPY;  Surgeon: Delight Ovens, MD;  Location: [facility] OR;  Service: Thoracic;  Laterality: N/A;  . Lymph node dissection 06/16/2012    Procedure: LYMPH NODE DISSECTION;  Surgeon: Delight Ovens, MD;  Location: [facility] OR;  Service: Thoracic;;  . Video bronchoscopy 06/24/2012    Procedure: VIDEO BRONCHOSCOPY;  Surgeon: Delight Ovens, MD;  Location: [facility] OR;  Service: Thoracic;  Laterality: N/A;  . Video bronchoscopy 06/29/2012    Procedure: VIDEO  BRONCHOSCOPY;  Surgeon: Delight Ovens, MD;  Location: [facility] OR;  Service: Thoracic;  Laterality: N/A;    Family History  Problem Relation Age of Onset  . Lung cancer Father   . Psoriasis Child   . Arthritis Child   . Heart disease Mother     Social History History  Substance Use Topics  . Smoking status: Former Smoker    Types: Cigarettes    Quit date: 12/30/1965  . Smokeless tobacco: Never Used  . Alcohol Use: Yes     infrequently- "indulge heavly when I do"    Current Outpatient Prescriptions  Medication Sig Dispense Refill  . aspirin 81 MG tablet Take 81 mg by mouth daily.      . carvedilol (COREG) 12.5 MG tablet Take 1 tablet (12.5 mg total) by mouth 2 (two) times daily with a meal.  60 tablet  1  . Cholecalciferol (VITAMIN D3) 1000 UNITS CAPS Take 1,000 Units by mouth daily.      . finasteride (PROSCAR) 5 MG tablet Take 5 mg by mouth Daily.      Marland Kitchen guaiFENesin (ROBITUSSIN) 100 MG/5ML SOLN Take 5 mLs (100 mg total) by mouth every 4 (four) hours as needed (cough).  1200 mL    . isosorbide mononitrate (IMDUR) 30 MG 24 hr tablet Take 1 tablet (30 mg total) by  mouth daily.  30 tablet  11  . Multiple Vitamins-Minerals (CENTRUM PO) Take 1 tablet by mouth daily.      Ailene Ards 3-6-9 Fatty Acids (TRIPLE OMEGA COMPLEX PO) Take 1 capsule by mouth daily.      . Tamsulosin HCl (FLOMAX) 0.4 MG CAPS Take 0.4 mg by mouth Daily.      . valsartan (DIOVAN) 160 MG tablet Take 80 mg by mouth daily.      . B Complex Vitamins (B COMPLEX 1) tablet Take 1 tablet by mouth daily.      Marland Kitchen moxifloxacin (AVELOX) 400 MG tablet Take 1 tablet (400 mg total) by mouth daily.  4 tablet  0  . ramipril (ALTACE) 2.5 MG capsule Take 1 capsule (2.5 mg total) by mouth daily.  30 capsule  11    Allergies  Allergen Reactions  . Lipitor (Atorvastatin) Other (See Comments)    Muscle pain    Review of Systems otherwise noncontributory  BP 103/63  Pulse 80  Temp 96.8 F (36 C)  Resp 16  Ht 5\' 4"  (1.626 m)  Wt  150 lb (68.04 kg)  BMI 25.75 kg/m2  SpO2 98% Physical Exam general-well-developed elderly male in no acute distress Pulmonary diminished breath sounds in the left base Cardiac regular rate and rhythm normal S1-S2 Incision healing well without evidence of infection Extremities no edema    Diagnostic Tests:A chest x-ray was obtained on today's date and shows improved pulmonary edema. Additionally atelectasis/postsurgical changes are stable   Impression:Steady progress   Plan:We will see him in 4 weeks with a repeat chest x-ray. I've encouraged him to continue use of his incentive spirometer and monitor his temperature curve. I encouraged him to increase his ambulation and we discussed limitations in regard to physical activities. He will also have an appointment made to see Dr. Truett Perna with oncology who he saw preoperatively to determine if any adjuvant therapies are required.

## 2012-07-20 NOTE — Telephone Encounter (Signed)
Patient is 6 weeks s/p VATS/Bronchoscopy from 06/14/12. Needs to obtain follow up with Dr. Truett Perna to discuss need for further therapy.

## 2012-07-22 ENCOUNTER — Telehealth: Payer: Self-pay | Admitting: Oncology

## 2012-07-22 ENCOUNTER — Telehealth: Payer: Self-pay | Admitting: *Deleted

## 2012-07-22 NOTE — Telephone Encounter (Signed)
Called pt to offer appt for 0930 on 07/24/12. He would like to come in earlier. Request to scheduler to move appt from 3:30 to 0930 same day with Dr. Truett Perna.

## 2012-07-22 NOTE — Telephone Encounter (Signed)
Per BS he will see pt this Friday 7/26 @ 3:30 pm. S/w pt he is aware.

## 2012-07-22 NOTE — Telephone Encounter (Signed)
Pt called this morning stating he was referred back to [facility] by Dr. Tyrone Sage. Per pt he has had surgery already and is very anxious to get back in to see BS. Note taken to [facility] re above. Pt aware

## 2012-07-24 ENCOUNTER — Ambulatory Visit (HOSPITAL_BASED_OUTPATIENT_CLINIC_OR_DEPARTMENT_OTHER): Payer: Medicare Other | Admitting: Oncology

## 2012-07-24 ENCOUNTER — Telehealth: Payer: Self-pay | Admitting: *Deleted

## 2012-07-24 VITALS — BP 86/44 | HR 80 | Temp 97.6°F | Ht 64.0 in | Wt 151.3 lb

## 2012-07-24 DIAGNOSIS — C3410 Malignant neoplasm of upper lobe, unspecified bronchus or lung: Secondary | ICD-10-CM

## 2012-07-24 NOTE — Telephone Encounter (Signed)
05-04-2013 made patient appointment for scan of the ct chest made patient appointment for 05-06-2013 at 9:00am printed out calendar and gave to the patient

## 2012-07-24 NOTE — Progress Notes (Signed)
   [facility]    OFFICE PROGRESS NOTE   INTERVAL HISTORY:   He returns for scheduled visit. He underwent a left upper lobectomy, resection of a portion of the superior segment of the left lower lobe and a lymph node dissection on 06/19/2012 by Dr. Tyrone Sage. The pathology confirmed a 3.8 cm squamous cell carcinoma with negative margins. Level X L., 2L, and 4L lymph nodes were negative.  He reports a nonproductive cough since surgery. He was taken off of ramipril  and the cough has persisted. He reports feeling "dizzy "at times. Mild exertional dyspnea. No pain.  He has been diagnosed with a decreased left ventricular ejection and coronary artery disease. He is followed by Dr. Mayford Knife. The valsartan was recently increased.  Objective:  Vital signs in last 24 hours:  Blood pressure 86/44, pulse 80, temperature 97.6 F (36.4 C), temperature source Oral, height 5\' 4"  (1.626 m), weight 151 lb 4.8 oz (68.629 kg).    HEENT: Neck without mass Lymphatics: No cervical, supraclavicular, or axillary nodes Resp: Decreased breath sounds at the left upper chest, no respiratory distress Cardio: Regular rate and rhythm with an occasional pulse GI: No hepatomegaly Vascular: No leg edema     Medications: I have reviewed the patient's current medications.  Assessment/Plan: 1. 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 Local recurrence of squamous cell carcinoma and a right face lymph node in 2006, status post surgical excision followed by radiation  3. Left hilar fullness on a chest x-ray 02/26/2012-stable compared to a chest x-ray from February of 2012 and new compared to a chest x-ray 2007 . A chest CT on 05/05/2012 confirmed a left upper lung nodule consistent with a primary bronchogenic carcinoma. A PET scan on 05/18/12 confirmed a hypermetabolic left upper lung nodule with no evidence of thoracic nodal or extrathoracic hypermetabolic  disease -He is status post a left upper lobectomy on 06/16/2012 with the pathology confirming a 3.8 cm squamous cell carcinoma (T2a, N0) 4. History of BPH  5. History of multiple skin cancers including basal cell carcinoma and squamous cell carcinomas  6. coronary artery disease, decreased left ventricular ejection fraction-followed by Dr. Mayford Knife 7. Hypotension today-likely related to polypharmacy    Disposition:  He has been diagnosed with stage IB non-small cell lung cancer. I discussed the diagnosis, prognosis, and adjuvant treatment options with Mr.Ostrosky and his wife. We discussed the lack of clear data to support the use of adjuvant systemic chemotherapy in this setting. Given his age and comorbid conditions I do not recommend adjuvant systemic therapy.  He will followup with Dr. Tyrone Sage next month for an office visit and chest x-ray. He would like to continue surveillance CT scans. He will be scheduled for a noncontrast CT the chest and office visit in May of 2014.  Mr.Robison will contact Drs. Turner/Griffin regarding the hypotension and management of his current medical regimen. He is currently taking both Proscar and Flomax.   Thornton Papas, MD  07/24/2012  10:37 AM

## 2012-08-17 ENCOUNTER — Other Ambulatory Visit: Payer: Self-pay | Admitting: Cardiothoracic Surgery

## 2012-08-17 DIAGNOSIS — D381 Neoplasm of uncertain behavior of trachea, bronchus and lung: Secondary | ICD-10-CM

## 2012-08-20 ENCOUNTER — Ambulatory Visit
Admission: RE | Admit: 2012-08-20 | Discharge: 2012-08-20 | Disposition: A | Discharge status: Home / Self Care | Payer: Medicare Other | Source: Ambulatory Visit | Attending: Cardiothoracic Surgery | Admitting: Cardiothoracic Surgery

## 2012-08-20 ENCOUNTER — Encounter: Payer: Self-pay | Admitting: Cardiothoracic Surgery

## 2012-08-20 ENCOUNTER — Ambulatory Visit (INDEPENDENT_AMBULATORY_CARE_PROVIDER_SITE_OTHER): Payer: Self-pay | Admitting: Cardiothoracic Surgery

## 2012-08-20 VITALS — BP 96/58 | HR 78 | Resp 16 | Ht 63.0 in | Wt 151.0 lb

## 2012-08-20 DIAGNOSIS — Z09 Encounter for follow-up examination after completed treatment for conditions other than malignant neoplasm: Secondary | ICD-10-CM

## 2012-08-20 DIAGNOSIS — C3410 Malignant neoplasm of upper lobe, unspecified bronchus or lung: Secondary | ICD-10-CM

## 2012-08-20 DIAGNOSIS — D381 Neoplasm of uncertain behavior of trachea, bronchus and lung: Secondary | ICD-10-CM

## 2012-08-20 NOTE — Progress Notes (Signed)
[address]            [place] [address]          [phone]       [name] [place] Medical Record #[id number] Date of Birth: [birth year]  Lillia Mountain, MD Lillia Mountain, MD  Chief Complaint:   PostOp Follow Up Visit 06/19/2012   OPERATIVE REPORT  PREOPERATIVE DIAGNOSIS: Left upper lobe lung mass.  POSTOPERATIVE DIAGNOSES: Left upper lobe lung mass, squamous cell  carcinoma, non-small cell carcinoma by frozen section.  PROCEDURE PERFORMED: Video bronchoscopy, left video-assisted  thoracoscopy with mini thoracotomy, resection of left upper lobe and  portion of superior segment left lower lobe and lymph node dissection.    History of Present Illness:      Patient seems to be doing well following resection, his chest wall discomfort is almost resolved. He still has some chronic dry cough that may be related to his ARB. He denies any fever chills or night sweats.     History  Smoking status  . Former Smoker  . Types: Cigarettes  . Quit date: 12/30/1965  Smokeless tobacco  . Never Used       Allergies  Allergen Reactions  . Lipitor (Atorvastatin) Other (See Comments)    Muscle pain    Current Outpatient Prescriptions  Medication Sig Dispense Refill  . aspirin 81 MG tablet Take 81 mg by mouth 2 (two) times daily.       . B Complex Vitamins (B COMPLEX 1) tablet Take 1 tablet by mouth daily.      . carvedilol (COREG) 3.125 MG tablet Take 6.25 mg by mouth 2 (two) times daily.       . Coenzyme Q10 100 MG TABS Take 300 mg by mouth 1 day or 1 dose.      . finasteride (PROSCAR) 5 MG tablet Take 5 mg by mouth Daily.      Marland Kitchen guaiFENesin (ROBITUSSIN) 100 MG/5ML SOLN Take 5 mLs (100 mg total) by mouth every 4 (four) hours as needed (cough).  1200 mL    . isosorbide mononitrate (IMDUR) 30 MG 24 hr tablet Take 1 tablet (30 mg total) by mouth daily.  30 tablet  11  . losartan (COZAAR) 25 MG tablet Take 25 mg by  mouth daily.      . Multiple Vitamins-Minerals (CENTRUM PO) Take 1 tablet by mouth daily.      Ailene Ards 3-6-9 Fatty Acids (TRIPLE OMEGA COMPLEX PO) Take 1 capsule by mouth daily.      . Tamsulosin HCl (FLOMAX) 0.4 MG CAPS Take 0.4 mg by mouth Daily.      . traMADol (ULTRAM) 50 MG tablet Take 50 mg by mouth as needed.      . Cholecalciferol (VITAMIN D3) 1000 UNITS CAPS Take 1,000 Units by mouth daily.           Physical Exam: BP 96/58  Pulse 78  Resp 16  Ht 5\' 3"  (1.6 m)  Wt 151 lb (68.493 kg)  BMI 26.75 kg/m2  SpO2 98%  General appearance: alert and cooperative Neurologic: intact Heart: regular rate and rhythm, S1, S2 normal, no murmur, click, rub or gallop and normal apical impulse Lungs: clear to auscultation bilaterally and normal percussion bilaterally Abdomen: soft, non-tender; bowel sounds normal; no masses,  no organomegaly Extremities: extremities normal, atraumatic, no cyanosis or edema and Homans  sign is negative, no sign of DVT Wound: well healed Wounds:no adenopathy  Diagnostic Studies & Laboratory data:         Recent Radiology Findings: Dg Chest 2 View  08/20/2012  *RADIOLOGY REPORT*  Clinical Data: Status post resection of left upper lobe lung carcinoma, shortness of breath  CHEST - 2 VIEW  Comparison: Chest x-ray of 07/20/2012 and  Findings: There is persistent pleural and parenchymal opacity at the left lung base postoperatively some of which is due to volume loss.  The right lung is clear.  Mediastinal contours are stable. Heart size is stable.  No bony abnormality is seen.  IMPRESSION: Stable pleural and parenchymal opacity at the left lung base postoperatively.   Original Report Authenticated By: Juline Patch, M.D.       Recent Labs: Lab Results  Component Value Date   WBC 8.2 06/27/2012   HGB 9.0* 06/27/2012   HCT 27.2* 06/27/2012   PLT 272 06/27/2012   GLUCOSE 117* 06/27/2012   ALT 15 06/18/2012   AST 17 06/18/2012   NA 137 06/27/2012   K 3.8 06/27/2012    CL 101 06/27/2012   CREATININE 0.92 06/27/2012   BUN 15 06/27/2012   CO2 26 06/27/2012   TSH 5.698* 11/30/2007   INR 1.21 06/24/2012   Path: Diagnosis 1. Lymph node, biopsy, 10L - ANTHRACOTIC LYMPH NODE. NO TUMOR IDENTIFIED. 2. Lung, resection (segmental or lobe), Left upper lung - SQUAMOUS CELL CARCINOMA, 3.8 CM. - MARGINS NOT INVOLVED. - TWO BENIGN ANTHRACOTIC LYMPH NODES (0/2). 3. Lymph node, biopsy, 2L - ANTHRACOTIC LYMPH NODE. NO TUMOR IDENTIFIED. 4. Lymph node, biopsy, 4L - ANTHRACOTIC LYMPH NODE. NO TUMOR IDENTIFIED. Microscopic Comment 2. LUNG Specimen, including laterality: Left upper lung lobe. Procedure: Lobectomy. Specimen integrity (intact/disrupted): Intact. Tumor site: Left upper lobe. Tumor focality: Unifocal. Maximum tumor size (cm): 3.8 cm. Histologic type: Squamous cell carcinoma. Grade: II. Margins: Free of tumor. Visceral pleural invasion: No. Tumor extension: Involves hilum of left upper lobe. Treatment effect (if treated with neoadjuvant therapy): No. Lymph -Vascular invasion: Not identified. Lymph nodes: Number examined - 5; Number N1 nodes positive 0; Number N2 nodes positive 0. TNM code: pT2a, pN0. Ancillary studies: Can be performed if requested. Non-neoplastic lung: Unremarkable. (JDP:eps 06/17/12)  Assessment / Plan:      Patient stable following left upper lobectomy for a squamous cell carcinoma stage I (pT2a, pN0.) Resected June of 2013. Plan to see him back in December with a repeat CT scan, about 6 months postop.    Delight Ovens MD 08/20/2012 1:47 PM

## 2012-11-16 ENCOUNTER — Other Ambulatory Visit: Payer: Self-pay | Admitting: Cardiothoracic Surgery

## 2012-11-16 DIAGNOSIS — C3490 Malignant neoplasm of unspecified part of unspecified bronchus or lung: Secondary | ICD-10-CM

## 2012-12-09 LAB — CREATININE, SERUM: Creat: 0.99 mg/dL (ref 0.50–1.35)

## 2012-12-09 LAB — BUN: BUN: 23 mg/dL (ref 6–23)

## 2012-12-10 ENCOUNTER — Ambulatory Visit
Admission: RE | Admit: 2012-12-10 | Discharge: 2012-12-10 | Disposition: A | Discharge status: Home / Self Care | Payer: Medicare Other | Source: Ambulatory Visit | Attending: Cardiothoracic Surgery | Admitting: Cardiothoracic Surgery

## 2012-12-10 ENCOUNTER — Ambulatory Visit (INDEPENDENT_AMBULATORY_CARE_PROVIDER_SITE_OTHER): Payer: Medicare Other | Admitting: Cardiothoracic Surgery

## 2012-12-10 ENCOUNTER — Encounter: Payer: Self-pay | Admitting: Cardiothoracic Surgery

## 2012-12-10 VITALS — BP 140/76 | HR 65 | Resp 18 | Ht 63.0 in | Wt 151.0 lb

## 2012-12-10 DIAGNOSIS — C3490 Malignant neoplasm of unspecified part of unspecified bronchus or lung: Secondary | ICD-10-CM

## 2012-12-10 DIAGNOSIS — C3410 Malignant neoplasm of upper lobe, unspecified bronchus or lung: Secondary | ICD-10-CM

## 2012-12-10 DIAGNOSIS — Z902 Acquired absence of lung [part of]: Secondary | ICD-10-CM

## 2012-12-10 DIAGNOSIS — Z9889 Other specified postprocedural states: Secondary | ICD-10-CM

## 2012-12-10 MED ORDER — IOHEXOL 300 MG/ML  SOLN
75.0000 mL | Freq: Once | INTRAMUSCULAR | Status: AC | PRN
  Administered 2012-12-10: 75 mL via INTRAVENOUS

## 2012-12-10 NOTE — Progress Notes (Signed)
[address]            [place] [address]          [phone]         [name] [facility] Medical Record #[id number] Date of Birth: 25-Jan-1936  Ladene Artist, MD Lillia Mountain, MD  Chief Complaint:   PostOp Follow Up Visit 06/19/2012   OPERATIVE REPORT  PREOPERATIVE DIAGNOSIS: Left upper lobe lung mass.  POSTOPERATIVE DIAGNOSES: Left upper lobe lung mass, squamous cell  carcinoma, non-small cell carcinoma by frozen section.  PROCEDURE PERFORMED: Video bronchoscopy, left video-assisted  thoracoscopy with mini thoracotomy, resection of left upper lobe and  portion of superior segment left lower lobe and lymph node dissection.    History of Present Illness:      Patient seems to be doing well following resection, his chest wall discomfort is almost resolved. Patient has returned to normal physical activities. He walks up and down stairs 4-5 times a day without difficulty. He's had no hemoptysis. He returns today with a followup CT scan proximate 6 months after he is surgical resection of left upper lobe lung mass.   History  Smoking status  . Former Smoker  . Types: Cigarettes  . Quit date: 12/30/1965  Smokeless tobacco  . Never Used       Allergies  Allergen Reactions  . Lipitor (Atorvastatin) Other (See Comments)    Muscle pain    Current Outpatient Prescriptions  Medication Sig Dispense Refill  . aspirin 81 MG tablet Take 81 mg by mouth 2 (two) times daily.       . B Complex Vitamins (B COMPLEX 1) tablet Take 1 tablet by mouth daily.      . carvedilol (COREG) 3.125 MG tablet Take 6.25 mg by mouth 2 (two) times daily.       . Cholecalciferol (VITAMIN D3) 1000 UNITS CAPS Take 1,000 Units by mouth daily.      . Coenzyme Q10 100 MG TABS Take 300 mg by mouth 1 day or 1 dose.      . finasteride (PROSCAR) 5 MG tablet Take 5 mg by mouth Daily.      . isosorbide mononitrate (IMDUR) 30 MG 24 hr  tablet Take 1 tablet (30 mg total) by mouth daily.  30 tablet  11  . losartan (COZAAR) 25 MG tablet Take 25 mg by mouth daily.      . Multiple Vitamins-Minerals (CENTRUM PO) Take 1 tablet by mouth daily.      Ailene Ards 3-6-9 Fatty Acids (TRIPLE OMEGA COMPLEX PO) Take 1 capsule by mouth daily.      . Tamsulosin HCl (FLOMAX) 0.4 MG CAPS Take 0.4 mg by mouth Daily.       No current facility-administered medications for this visit.   Facility-Administered Medications Ordered in Other Visits  Medication Dose Route Frequency Provider Last Rate Last Dose  . [COMPLETED] iohexol (OMNIPAQUE) 300 MG/ML solution 75 mL  75 mL Intravenous Once PRN Medication Radiologist, MD   75 mL at 12/10/12 1455       Physical Exam: BP 140/76  Pulse 65  Resp 18  Ht 5\' 3"  (1.6 m)  Wt 151 lb (68.493 kg)  BMI 26.75 kg/m2  SpO2 98%  General appearance: alert and cooperative Neurologic: intact Heart: regular  rate and rhythm, S1, S2 normal, no murmur, click, rub or gallop and normal apical impulse Lungs: clear to auscultation bilaterally and normal percussion bilaterally Abdomen: soft, non-tender; bowel sounds normal; no masses,  no organomegaly Extremities: extremities normal, atraumatic, no cyanosis or edema and Homans sign is negative, no sign of DVT Wound: well healed Wounds:no adenopathy  Diagnostic Studies & Laboratory data:         Recent Radiology Findings: Ct Chest W Contrast  12/10/2012  *RADIOLOGY REPORT*  Clinical Data: History of lung cancer status post left upper lobectomy and chemotherapy.  Follow-up study.  CT CHEST WITH CONTRAST  Technique:  Multidetector CT imaging of the chest was performed following the standard protocol during bolus administration of intravenous contrast.  Contrast: 75mL OMNIPAQUE IOHEXOL 300 MG/ML  SOLN  Comparison: PET CT 05/18/2012.  Chest CT 05/05/2012.  Findings:  Mediastinum: Heart size is mildly enlarged. There is no significant pericardial fluid, thickening or  pericardial calcification. There is atherosclerosis of the thoracic aorta, the great vessels of the mediastinum and the coronary arteries, including calcified atherosclerotic plaque in the left main, left anterior descending, left circumflex and right coronary arteries. There are numerous borderline enlarged mediastinal and bilateral hilar lymph nodes, largest of which measures up to 9 mm in the low right paratracheal station.  No definite pathologic nodal enlargement is appreciated on today's examination.  The esophagus is unremarkable in appearance.  Lungs/Pleura: The patient is status post left upper lobectomy. There is some scarring in the periphery of the base of the left lower lobe.  No suspicious appearing pulmonary nodules or masses are identified within either of the lungs.  Trace left pleural effusion posteriorly.  Upper Abdomen: A 7 mm nonobstructive calculus in the lower pole collecting system of the left kidney.  Musculoskeletal: There are no aggressive appearing lytic or blastic lesions noted in the visualized portions of the skeleton.  Post thoracotomy changes in the left ribs.  IMPRESSION: 1.  Status post left upper lobectomy without evidence to suggest local recurrence of disease or new metastatic disease in the thorax. 2. Atherosclerosis, including left main and three-vessel coronary artery disease.   Assessment for potential risk factor modification, dietary therapy or pharmacologic therapy may be warranted, if clinically indicated. 3.  Mild cardiomegaly.   Original Report Authenticated By: Trudie Reed, M.D.       Recent Labs: Lab Results  Component Value Date   WBC 8.2 06/27/2012   HGB 9.0* 06/27/2012   HCT 27.2* 06/27/2012   PLT 272 06/27/2012   GLUCOSE 117* 06/27/2012   ALT 15 06/18/2012   AST 17 06/18/2012   NA 137 06/27/2012   K 3.8 06/27/2012   CL 101 06/27/2012   CREATININE 0.99 12/09/2012   BUN 23 12/09/2012   CO2 26 06/27/2012   TSH 5.698* 11/30/2007   INR 1.21 06/24/2012    Path: Diagnosis 1. Lymph node, biopsy, 10L - ANTHRACOTIC LYMPH NODE. NO TUMOR IDENTIFIED. 2. Lung, resection (segmental or lobe), Left upper lung - SQUAMOUS CELL CARCINOMA, 3.8 CM. - MARGINS NOT INVOLVED. - TWO BENIGN ANTHRACOTIC LYMPH NODES (0/2). 3. Lymph node, biopsy, 2L - ANTHRACOTIC LYMPH NODE. NO TUMOR IDENTIFIED. 4. Lymph node, biopsy, 4L - ANTHRACOTIC LYMPH NODE. NO TUMOR IDENTIFIED. Microscopic Comment 2. LUNG Specimen, including laterality: Left upper lung lobe. Procedure: Lobectomy. Specimen integrity (intact/disrupted): Intact. Tumor site: Left upper lobe. Tumor focality: Unifocal. Maximum tumor size (cm): 3.8 cm. Histologic type: Squamous cell carcinoma. Grade: II. Margins: Free of tumor. Visceral pleural invasion:  No. Tumor extension: Involves hilum of left upper lobe. Treatment effect (if treated with neoadjuvant therapy): No. Lymph -Vascular invasion: Not identified. Lymph nodes: Number examined - 5; Number N1 nodes positive 0; Number N2 nodes positive 0. TNM code: pT2a, pN0. Ancillary studies: Can be performed if requested. Non-neoplastic lung: Unremarkable. (JDP:eps 06/17/12)  Assessment / Plan:      Patient stable following left upper lobectomy for a squamous cell carcinoma stage I (pT2a, pN0.) Resected June of 2013. Plan to see him back in June of 2014 with a repeat CT scan of the chest, about 6 months . Currently there is no evidence of recurrent carcinoma along   Delight Ovens MD 12/10/2012 3:44 PM

## 2012-12-10 NOTE — Patient Instructions (Signed)
Ct scan looks ok Will repeat in 6 months

## 2013-01-14 ENCOUNTER — Other Ambulatory Visit: Payer: Self-pay | Admitting: Urology

## 2013-02-04 ENCOUNTER — Encounter (HOSPITAL_BASED_OUTPATIENT_CLINIC_OR_DEPARTMENT_OTHER): Payer: Self-pay | Admitting: *Deleted

## 2013-02-04 NOTE — Progress Notes (Signed)
To [facility] at 0900-istat on arrival,Ekg,CT in epic-reviewed chart with Dr Council Mechanic -will take coreg,losartan,isosorbide with small amt water that morning only-Npo after Mn.

## 2013-02-08 ENCOUNTER — Encounter (HOSPITAL_BASED_OUTPATIENT_CLINIC_OR_DEPARTMENT_OTHER): Payer: Self-pay | Admitting: Anesthesiology

## 2013-02-08 ENCOUNTER — Ambulatory Visit (HOSPITAL_BASED_OUTPATIENT_CLINIC_OR_DEPARTMENT_OTHER): Payer: Medicare Other | Admitting: Anesthesiology

## 2013-02-08 ENCOUNTER — Encounter (HOSPITAL_BASED_OUTPATIENT_CLINIC_OR_DEPARTMENT_OTHER)
Admission: RE | Disposition: A | Discharge status: Home / Self Care | Payer: Self-pay | Source: Ambulatory Visit | Attending: Urology

## 2013-02-08 ENCOUNTER — Ambulatory Visit (HOSPITAL_BASED_OUTPATIENT_CLINIC_OR_DEPARTMENT_OTHER)
Admission: RE | Admit: 2013-02-08 | Discharge: 2013-02-08 | Disposition: A | Discharge status: Home / Self Care | Payer: Medicare Other | Source: Ambulatory Visit | Attending: Urology | Admitting: Urology

## 2013-02-08 DIAGNOSIS — I10 Essential (primary) hypertension: Secondary | ICD-10-CM | POA: Insufficient documentation

## 2013-02-08 DIAGNOSIS — N529 Male erectile dysfunction, unspecified: Secondary | ICD-10-CM | POA: Insufficient documentation

## 2013-02-08 DIAGNOSIS — N401 Enlarged prostate with lower urinary tract symptoms: Secondary | ICD-10-CM | POA: Insufficient documentation

## 2013-02-08 DIAGNOSIS — Z79899 Other long term (current) drug therapy: Secondary | ICD-10-CM | POA: Insufficient documentation

## 2013-02-08 DIAGNOSIS — Z7982 Long term (current) use of aspirin: Secondary | ICD-10-CM | POA: Insufficient documentation

## 2013-02-08 DIAGNOSIS — N138 Other obstructive and reflux uropathy: Secondary | ICD-10-CM | POA: Insufficient documentation

## 2013-02-08 DIAGNOSIS — E7800 Pure hypercholesterolemia, unspecified: Secondary | ICD-10-CM | POA: Insufficient documentation

## 2013-02-08 DIAGNOSIS — E291 Testicular hypofunction: Secondary | ICD-10-CM | POA: Insufficient documentation

## 2013-02-08 DIAGNOSIS — N200 Calculus of kidney: Secondary | ICD-10-CM | POA: Insufficient documentation

## 2013-02-08 DIAGNOSIS — N400 Enlarged prostate without lower urinary tract symptoms: Secondary | ICD-10-CM

## 2013-02-08 DIAGNOSIS — R972 Elevated prostate specific antigen [PSA]: Secondary | ICD-10-CM | POA: Insufficient documentation

## 2013-02-08 DIAGNOSIS — N139 Obstructive and reflux uropathy, unspecified: Secondary | ICD-10-CM | POA: Insufficient documentation

## 2013-02-08 HISTORY — DX: Essential (primary) hypertension: I10

## 2013-02-08 HISTORY — PX: GREEN LIGHT LASER TURP (TRANSURETHRAL RESECTION OF PROSTATE: SHX6260

## 2013-02-08 LAB — POCT I-STAT 4, (NA,K, GLUC, HGB,HCT)
Glucose, Bld: 108 mg/dL — ABNORMAL HIGH (ref 70–99)
HCT: 44 % (ref 39.0–52.0)
Potassium: 4.1 mEq/L (ref 3.5–5.1)
Sodium: 144 mEq/L (ref 135–145)

## 2013-02-08 SURGERY — GREEN LIGHT LASER TURP (TRANSURETHRAL RESECTION OF PROSTATE
Anesthesia: General | Site: Prostate | Wound class: Clean Contaminated

## 2013-02-08 MED ORDER — CEFAZOLIN SODIUM-DEXTROSE 2-3 GM-% IV SOLR
INTRAVENOUS | Status: DC | PRN
  Administered 2013-02-08: 2 g via INTRAVENOUS

## 2013-02-08 MED ORDER — TRIMETHOPRIM 100 MG PO TABS: 100.0000 mg | ORAL_TABLET | ORAL | Status: DC

## 2013-02-08 MED ORDER — EPHEDRINE SULFATE 50 MG/ML IJ SOLN
INTRAMUSCULAR | Status: DC | PRN
  Administered 2013-02-08 (×3): 10 mg via INTRAVENOUS

## 2013-02-08 MED ORDER — LACTATED RINGERS IV SOLN
INTRAVENOUS | Status: DC
  Administered 2013-02-08: 10:00:00 via INTRAVENOUS
  Filled 2013-02-08: qty 1000

## 2013-02-08 MED ORDER — LACTATED RINGERS IV SOLN
INTRAVENOUS | Status: DC | PRN
  Administered 2013-02-08 (×2): via INTRAVENOUS

## 2013-02-08 MED ORDER — SODIUM CHLORIDE 0.9 % IR SOLN
Status: DC | PRN
  Administered 2013-02-08: 12000 mL via INTRAVESICAL

## 2013-02-08 MED ORDER — ACETAMINOPHEN 10 MG/ML IV SOLN
INTRAVENOUS | Status: DC | PRN
  Administered 2013-02-08: 1000 mg via INTRAVENOUS

## 2013-02-08 MED ORDER — PROPOFOL 10 MG/ML IV BOLUS
INTRAVENOUS | Status: DC | PRN
  Administered 2013-02-08: 160 mg via INTRAVENOUS

## 2013-02-08 MED ORDER — KETOROLAC TROMETHAMINE 30 MG/ML IJ SOLN
INTRAMUSCULAR | Status: DC | PRN
  Administered 2013-02-08: 30 mg via INTRAVENOUS

## 2013-02-08 MED ORDER — BELLADONNA ALKALOIDS-OPIUM 16.2-60 MG RE SUPP
RECTAL | Status: DC | PRN
  Administered 2013-02-08: 1 via RECTAL

## 2013-02-08 MED ORDER — CEFAZOLIN SODIUM-DEXTROSE 2-3 GM-% IV SOLR
2.0000 g | INTRAVENOUS | Status: DC
  Filled 2013-02-08: qty 50

## 2013-02-08 MED ORDER — INDIGOTINDISULFONATE SODIUM 8 MG/ML IJ SOLN
INTRAMUSCULAR | Status: DC | PRN
  Administered 2013-02-08: 5 mL via INTRAVENOUS

## 2013-02-08 MED ORDER — HYDROMORPHONE HCL PF 1 MG/ML IJ SOLN
0.2500 mg | INTRAMUSCULAR | Status: DC | PRN
  Filled 2013-02-08: qty 1

## 2013-02-08 MED ORDER — ONDANSETRON HCL 4 MG/2ML IJ SOLN
INTRAMUSCULAR | Status: DC | PRN
  Administered 2013-02-08: 4 mg via INTRAVENOUS

## 2013-02-08 MED ORDER — GLYCOPYRROLATE 0.2 MG/ML IJ SOLN
INTRAMUSCULAR | Status: DC | PRN
  Administered 2013-02-08: 0.2 mg via INTRAVENOUS

## 2013-02-08 MED ORDER — OXYCODONE-ACETAMINOPHEN 5-325 MG PO TABS: 1.0000 | ORAL_TABLET | ORAL | Status: DC | PRN

## 2013-02-08 MED ORDER — LACTATED RINGERS IV SOLN
INTRAVENOUS | Status: DC
  Administered 2013-02-08: 13:00:00 via INTRAVENOUS
  Filled 2013-02-08: qty 1000

## 2013-02-08 MED ORDER — DEXAMETHASONE SODIUM PHOSPHATE 4 MG/ML IJ SOLN
INTRAMUSCULAR | Status: DC | PRN
  Administered 2013-02-08: 10 mg via INTRAVENOUS

## 2013-02-08 MED ORDER — URELLE 81 MG PO TABS: 1.0000 | ORAL_TABLET | Freq: Three times a day (TID) | ORAL | Status: DC

## 2013-02-08 MED ORDER — LIDOCAINE HCL (CARDIAC) 20 MG/ML IV SOLN
INTRAVENOUS | Status: DC | PRN
  Administered 2013-02-08: 100 mg via INTRAVENOUS

## 2013-02-08 MED ORDER — FENTANYL CITRATE 0.05 MG/ML IJ SOLN
INTRAMUSCULAR | Status: DC | PRN
  Administered 2013-02-08 (×4): 50 ug via INTRAVENOUS

## 2013-02-08 MED ORDER — PROMETHAZINE HCL 25 MG/ML IJ SOLN
6.2500 mg | INTRAMUSCULAR | Status: DC | PRN
  Filled 2013-02-08: qty 1

## 2013-02-08 SURGICAL SUPPLY — 24 items
BAG URINE DRAINAGE (UROLOGICAL SUPPLIES) ×2 IMPLANT
BAG URO CATCHER STRL LF (DRAPE) ×2 IMPLANT
CATH AINSWORTH 30CC 24FR (CATHETERS) ×2 IMPLANT
CATH FOLEY 2WAY SLVR 30CC 22FR (CATHETERS) ×2 IMPLANT
CATH HEMA 3WAY 30CC 24FR COUDE (CATHETERS) ×2 IMPLANT
CLOTH BEACON ORANGE TIMEOUT ST (SAFETY) ×2 IMPLANT
DRAPE CAMERA CLOSED 9X96 (DRAPES) ×2 IMPLANT
ELECT BUTTON HF 24-28F 2 30DE (ELECTRODE) IMPLANT
ELECT LOOP MED HF 24F 12D (CUTTING LOOP) IMPLANT
ELECT LOOP MED HF 24F 12D CBL (CLIP) IMPLANT
ELECT RESECT VAPORIZE 12D CBL (ELECTRODE) IMPLANT
GLOVE BIOGEL M STRL SZ7.5 (GLOVE) ×2 IMPLANT
GOWN STRL REIN XL XLG (GOWN DISPOSABLE) ×2 IMPLANT
GUIDEWIRE STR DUAL SENSOR (WIRE) ×2 IMPLANT
HOLDER FOLEY CATH W/STRAP (MISCELLANEOUS) ×2 IMPLANT
IV NS 1000ML (IV SOLUTION) ×1
IV NS 1000ML BAXH (IV SOLUTION) ×1 IMPLANT
IV NS IRRIG 3000ML ARTHROMATIC (IV SOLUTION) ×8 IMPLANT
LASER FIBER /GREENLIGHT LASER (Laser) ×2 IMPLANT
LASER GREENLIGHT RENTAL P/PROC (Laser) ×2 IMPLANT
PACK CYSTOSCOPY (CUSTOM PROCEDURE TRAY) ×2 IMPLANT
SYR 30ML LL (SYRINGE) ×4 IMPLANT
SYRINGE IRR TOOMEY STRL 70CC (SYRINGE) ×2 IMPLANT
WATER STERILE IRR 500ML POUR (IV SOLUTION) ×6 IMPLANT

## 2013-02-08 NOTE — Anesthesia Procedure Notes (Signed)
Procedure Name: LMA Insertion Date/Time: 02/08/2013 10:45 AM Performed by: Jessica Priest Pre-anesthesia Checklist: Patient identified, Emergency Drugs available, Suction available and Patient being monitored Patient Re-evaluated:Patient Re-evaluated prior to inductionOxygen Delivery Method: Circle System Utilized Preoxygenation: Pre-oxygenation with 100% oxygen Intubation Type: IV induction Ventilation: Mask ventilation without difficulty LMA: LMA inserted LMA Size: 4.0 Number of attempts: 1 Airway Equipment and Method: bite block Placement Confirmation: positive ETCO2 Tube secured with: Tape Dental Injury: Teeth and Oropharynx as per pre-operative assessment

## 2013-02-08 NOTE — H&P (Signed)
Active Problems Problems  1. Benign Prostatic Hypertrophy With Urinary Obstruction 600.01 2. Hypogonadism 257.2 3. 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 Nephrolithiasis 592.0 5. PSA,Elevated 790.93  History of Present Illness        77 yo male returns today for cystoscopy, flowrate, and PVR for possible candidate for Burna Mortimer procedure.  Hx of elevated PSA, BPH, hypogonadism, ED, & kidney stones.  Patient had been doing well on Finasteride and Tamsulosin ( QOL=4/6). He has a hx of poor voiding pattern previously on Rapaflo, with previous IPSS = 21.  He is using Axiron w/o problems, with f/u T of  571 on 06/11/11. PUS=58.97cc. IPSS=19.   PSA 4.54 September 2008, with PSA-2 of 12%. 12/08/08  PSA 5.6/13.6% and 12/21/09  PSA 6.58 (by Dr. Valentina Lucks).  He had a saturation prostate biopsy on 11/28/10 which was negative.  He has a hx of kidney stones and epididymal cysts, but not currently being bothered.   He felt that the Uroxatral was too "pricey", and did not try.  Previously failed Tamsulosin.  Hx of decreased libido, but without fatigue and muscle weakness.    ED x 3-4 years, failing sample of ED med. He is getting partial erection and cannot penetrate. He is able to ejaculate, but has low volume (pus shows no blockage).  12/01/12  PSA - 1.56 11/27/11  PSA - 2.65 06/11/11  Testosterone - 571.21 08/21/10  PSA - 7.21/10.1%  11/28/10  Testosterone - 225.50.   Past Medical History Problems  1. History of  Cancer 199.1 2. History of  Destruction Of Malignant Lesion 3. History of  Hypercholesterolemia 272.0 4. History of  Hypertension 401.9 5. History of  Murmurs 785.2  Surgical History Problems  1. History of  Chemosurgery (Mohs Micrographic Technique) 2. History of  Lung Surgery Left  Current Meds 1. Aspirin 81 MG Oral Tablet; Therapy: (Recorded:30Jun2009) to 2. Carvedilol 12.5 MG Oral Tablet; Therapy: (Recorded:11Dec2013) to 3. Centrum Silver Oral Tablet;  Therapy: (Recorded:11Dec2013) to 4. Co Q 10 CAPS; Therapy: (Recorded:11Dec2013) to 5. Finasteride 5 MG Oral Tablet; TAKE 1 TABLET DAILY AS DIRECTED; Therapy: 20Jun2012 to  (Evaluate:14Dec2013)  Requested for: 20Mar2013; Last Rx:19Mar2013 6. Isosorbide Mononitrate ER 30 MG Oral Tablet Extended Release 24 Hour; Therapy:  (Recorded:11Dec2013) to 7. Tamsulosin HCl 0.4 MG Oral Capsule; TAKE 1 CAPSULE Bedtime; Therapy: 05Dec2012 to  (Evaluate:14Dec2013)  Requested for: 20Mar2013; Last Rx:19Mar2013 8. Triple Omega-3-6-9 CAPS; Therapy: (Recorded:11Dec2013) to 9. Vitamin D3 TABS; Therapy: (Recorded:11Dec2013) to  Allergies Medication  1. Statins  Family History Problems  1. Maternal history of  Diabetes Mellitus V18.0 2. Family history of  Family Health Status Number Of Children 2 sons 3. Family history of  Father Deceased At Age ____ 32, lung cancer 4. Family history of  Mother Deceased At Age ____ 38, heart disease 5. Paternal history of  Nephrolithiasis 6. Son's history of  Nephrolithiasis  Social History Problems  1. Alcohol Use 0-2 PER DAY 2. Caffeine Use 4 PER DAY 3. Family history of  Death In The Family Father 65 LUNG CANCER 4. Tobacco Use V15.82 1 PPD FOR 12 YEARS, QUIT 40 YEARS AGO, NO OTHER FORMS OF TOBACCO  Review of Systems Genitourinary, constitutional, skin, eye, otolaryngeal, hematologic/lymphatic, cardiovascular, pulmonary, endocrine, musculoskeletal, gastrointestinal, neurological and psychiatric system(s) were reviewed and pertinent findings if present are noted.  Genitourinary: urinary frequency, feelings of urinary urgency, nocturia, weak urinary stream, urinary stream starts and stops, incomplete emptying of bladder and initiating urination requires straining.  Vitals Vital Signs [Data Includes: Last 1 Day]  18Dec2013 04:27PM  Blood Pressure: 124 / 82 Temperature: 97.7 F  Results/Data  Flow Rate: Voided 324 ml. A peak flow rate of 61ml/s and mean flow  rate of 59ml/s.  PVR: Ultrasound PVR 146.23 ml.    Procedure  PUS - 60.19 grams   Procedure: Cystoscopy   Indication: Lower Urinary Tract Symptoms.  Informed Consent: Risks, benefits, and potential adverse events were discussed and informed consent was obtained from the patient.  Prep: The patient was prepped with betadine.  Anesthesia:. Local anesthesia was administered intraurethrally with 2% lidocaine jelly.  Antibiotic prophylaxis: Ciprofloxacin.  Procedure Note:  Urethral meatus:. No abnormalities. Amended By: Jethro Bolus; 12/30/2012 2:14 PMEST.  Anterior urethra: No abnormalities Amended By: Jethro Bolus; 12/30/2012 2:14 PMEST.  Prostatic urethra:. The lateral and median prostatic lobes were enlarged. Amended By: Jethro Bolus; 12/30/2012 2:14 PMEST . An enlarged intravesical median lobe was visualized. Amended By: Jethro Bolus; 12/30/2012 2:14 PMEST.  Bladder: Visulization was clear Amended By: Jethro Bolus; 12/30/2012 2:14 PMEST. The ureteral orifices were in the normal anatomic position bilaterally Amended By: Jethro Bolus; 12/30/2012 2:14 PMEST. Examination of the bladder demonstrated trabeculation Amended By: Jethro Bolus; 12/30/2012 2:14 PMEST, but no clot within the bladder Amended By: Jethro Bolus; 12/30/2012 2:14 PMEST no erythematous mucosa Amended By: Jethro Bolus; 12/30/2012 2:14 PMEST , no ulcer Amended By: Jethro Bolus; 12/30/2012 2:14 PMEST  and no cellules Amended By: Jethro Bolus; 12/30/2012 2:14 PMEST. The patient tolerated the procedure well Amended By: Jethro Bolus; 12/30/2012 2:14 PMEST  Complications: None. Amended By: Jethro Bolus; 12/30/2012 2:14 PMEST.    Assessment Assessed  1. Benign Prostatic Hypertrophy With Urinary Obstruction 600.01   Pt has BPH with elevated pvr and decreased flow rate. I  have discussed green light laser with him, vs waiting for 6 months, vs TURP.  He is concerned with the possibility of limiting his options for Ca Rx, and we have discusse this. I am comfortable with putting off his decision for now, and will RTC in 1 month to discuss again. After discussing again, he is ready to proceed wit GLL.   Plan Benign Prostatic Hypertrophy With Urinary Obstruction (600.01)  1. Cysto  Done: 18Dec2013 2. Follow-up Month x 1 Office  Follow-up  Requested for: 18Dec2013   Schedule [facility]   Signatures Electronically signed by : Jethro Bolus, M.D.; Dec 30 2012  2:14PM

## 2013-02-08 NOTE — Anesthesia Preprocedure Evaluation (Addendum)
Anesthesia Evaluation  Patient identified by MRN, date of birth, ID band Patient awake    Reviewed: Allergy & Precautions, H&P , NPO status , Patient's Chart, lab work & pertinent test results  Airway Mallampati: II TM Distance: >3 FB Neck ROM: Full    Dental  (+) Teeth Intact, Dental Advisory Given and Partial Upper   Pulmonary former smoker,  Hx lung mass; prior video bronchoscopy with resection left upper lobe breath sounds clear to auscultation        Cardiovascular hypertension, Pt. on medications Rhythm:Regular Rate:Normal     Neuro/Psych negative neurological ROS  negative psych ROS   GI/Hepatic negative GI ROS, Neg liver ROS,   Endo/Other  negative endocrine ROS  Renal/GU Renal disease  negative genitourinary   Musculoskeletal negative musculoskeletal ROS (+)   Abdominal   Peds negative pediatric ROS (+)  Hematology negative hematology ROS (+)   Anesthesia Other Findings   Reproductive/Obstetrics negative OB ROS                          Anesthesia Physical Anesthesia Plan  ASA: III  Anesthesia Plan: General   Post-op Pain Management:    Induction: Intravenous  Airway Management Planned: LMA  Additional Equipment:   Intra-op Plan:   Post-operative Plan: Extubation in OR  Informed Consent: I have reviewed the patients History and Physical, chart, labs and discussed the procedure including the risks, benefits and alternatives for the proposed anesthesia with the patient or authorized representative who has indicated his/her understanding and acceptance.   Dental advisory given  Plan Discussed with: CRNA  Anesthesia Plan Comments:         Anesthesia Quick Evaluation

## 2013-02-08 NOTE — Interval H&P Note (Signed)
History and Physical Interval Note:  02/08/2013 12:00 PM  [name] [name]  has presented today for surgery, with the diagnosis of Benign Prostatic Hypertrophy  The various methods of treatment have been discussed with the patient and family. After consideration of risks, benefits and other options for treatment, the patient has consented to  Procedure(s): GREEN LIGHT LASER TURP (TRANSURETHRAL RESECTION OF PROSTATE (N/A) as a surgical intervention .  The patient's history has been reviewed, patient examined, no change in status, stable for surgery.  I have reviewed the patient's chart and labs.  Questions were answered to the patient's satisfaction.     Jethro Bolus I

## 2013-02-08 NOTE — Transfer of Care (Signed)
Immediate Anesthesia Transfer of Care Note  Patient: [name] [name]  Procedure(s) Performed: Procedure(s) (LRB): GREEN LIGHT LASER TURP (TRANSURETHRAL RESECTION OF PROSTATE (N/A)  Patient Location: PACU  Anesthesia Type: General  Level of Consciousness: awake, sedated, patient cooperative and responds to stimulation  Airway & Oxygen Therapy: Patient Spontanous Breathing and Patient connected to face mask oxygen  Post-op Assessment: Report given to PACU RN, Post -op Vital signs reviewed and stable and Patient moving all extremities  Post vital signs: Reviewed and stable  Complications: No apparent anesthesia complications

## 2013-02-08 NOTE — Op Note (Signed)
Pre-operative diagnosis : BPH  Postoperative diagnosis:   Same  Operation:   Green light laser vaporization of prostate  Surgeon:  S. Patsi Sears, MD  First assistant:  None  Anesthesia:  general  Preparation:    After appropriate preanesthesia, the patient was brought to the operating room, placed on the operating table in dorsal supine position where general LMA anesthesia was introduced. He was then replaced in the dorsal lithotomy position with pubis was prepped with Betadine solution and draped in usual fashion. Armband was double checked.  Review history:   yo male returns today for cystoscopy, flowrate, and PVR for possible candidate for Burna Mortimer procedure. Hx of elevated PSA, BPH, hypogonadism, ED, & kidney stones. Patient had been doing well on Finasteride and Tamsulosin ( QOL=4/6). He has a hx of poor voiding pattern previously on Rapaflo, with previous IPSS = 21. He is using Axiron w/o problems, with f/u T of 571 on 06/11/11. PUS=58.97cc. IPSS=19.  PSA 4.54 September 2008, with PSA-2 of 12%. 12/08/08 PSA 5.6/13.6% and 12/21/09 PSA 6.58 (by Dr. Valentina Lucks). He had a saturation prostate biopsy on 11/28/10 which was negative. He has a hx of kidney stones and epididymal cysts, but not currently being bothered.  He felt that the Uroxatral was too "pricey", and did not try. Previously failed Tamsulosin. Hx of decreased libido, but without fatigue and muscle weakness.  ED x 3-4 years, failing sample of ED med. He is getting partial erection and cannot penetrate. He is able to ejaculate, but has low volume (pus shows no blockage).      Statement of  Likelihood of Success: Excellent. TIME-OUT observed.:  Procedure:   Cystourethroscopy was accomplished, and showed a very tight urethral meatus requiring dilation from 41 [place] to a size 26 [place] with the male sounds. From this, the size 23 laser bridge was placed. Inspection revealed a sulcus but at the 5:00 position the 7:00 position. Using  the green light laser at 80 W, the trench was made at 5:00 and 7:00, from the bladder neck to the Vero. At 120 W, the lateral lobe tissue and the median lobe tissue was vaporized. The bladder neck was noted to be very high because of elevated median lobe. Some bleeding was noted at the right lateral lobe, and this was all controlled with the laser. Following seizure, repeat cystoscopy showed the ureteral orifices, which were outlined with indigocarmine, to be proximal to the bladder neck, and laterally displaced, secondary to chronic BPH. Trabeculation cellules were identified in the bladder but there was no bladder stone or tumor. There was no bladder diverticulum noted.  A size 2230 cc Foley was placed, with traction. The patient was awakened, taken to recovery room in good condition.

## 2013-02-08 NOTE — Anesthesia Postprocedure Evaluation (Signed)
Anesthesia Post Note  Patient: [name] [name]  Procedure(s) Performed: Procedure(s) (LRB): GREEN LIGHT LASER TURP (TRANSURETHRAL RESECTION OF PROSTATE (N/A)  Anesthesia type: General  Patient location: PACU  Post pain: Pain level controlled  Post assessment: Post-op Vital signs reviewed  Last Vitals:  Filed Vitals:   02/08/13 1215  BP: 153/73  Pulse: 73  Temp: 36.5 C  Resp: 12    Post vital signs: Reviewed  Level of consciousness: sedated  Complications: No apparent anesthesia complications

## 2013-02-09 ENCOUNTER — Encounter (HOSPITAL_BASED_OUTPATIENT_CLINIC_OR_DEPARTMENT_OTHER): Payer: Self-pay | Admitting: Urology

## 2013-03-26 ENCOUNTER — Other Ambulatory Visit: Payer: Self-pay | Admitting: Internal Medicine

## 2013-03-26 DIAGNOSIS — M949 Disorder of cartilage, unspecified: Secondary | ICD-10-CM

## 2013-04-01 ENCOUNTER — Ambulatory Visit
Admission: RE | Admit: 2013-04-01 | Discharge: 2013-04-01 | Disposition: A | Discharge status: Home / Self Care | Payer: Medicare Other | Source: Ambulatory Visit | Attending: Internal Medicine | Admitting: Internal Medicine

## 2013-04-01 DIAGNOSIS — M899 Disorder of bone, unspecified: Secondary | ICD-10-CM

## 2013-04-01 MED ORDER — IOHEXOL 300 MG/ML  SOLN: 75.0000 mL | Freq: Once | INTRAMUSCULAR | Status: AC | PRN

## 2013-05-04 ENCOUNTER — Ambulatory Visit (HOSPITAL_COMMUNITY): Payer: Medicare Other

## 2013-05-05 ENCOUNTER — Ambulatory Visit (HOSPITAL_COMMUNITY)
Admission: RE | Admit: 2013-05-05 | Discharge: 2013-05-05 | Disposition: A | Discharge status: Home / Self Care | Payer: Medicare Other | Source: Ambulatory Visit | Attending: Oncology | Admitting: Oncology

## 2013-05-05 ENCOUNTER — Encounter (HOSPITAL_COMMUNITY): Payer: Self-pay

## 2013-05-05 DIAGNOSIS — Z902 Acquired absence of lung [part of]: Secondary | ICD-10-CM | POA: Insufficient documentation

## 2013-05-05 DIAGNOSIS — I7090 Unspecified atherosclerosis: Secondary | ICD-10-CM | POA: Insufficient documentation

## 2013-05-05 DIAGNOSIS — I517 Cardiomegaly: Secondary | ICD-10-CM | POA: Insufficient documentation

## 2013-05-05 DIAGNOSIS — C3410 Malignant neoplasm of upper lobe, unspecified bronchus or lung: Secondary | ICD-10-CM

## 2013-05-05 DIAGNOSIS — K5730 Diverticulosis of large intestine without perforation or abscess without bleeding: Secondary | ICD-10-CM | POA: Insufficient documentation

## 2013-05-05 DIAGNOSIS — C3490 Malignant neoplasm of unspecified part of unspecified bronchus or lung: Secondary | ICD-10-CM | POA: Insufficient documentation

## 2013-05-05 DIAGNOSIS — I700 Atherosclerosis of aorta: Secondary | ICD-10-CM | POA: Insufficient documentation

## 2013-05-05 DIAGNOSIS — N200 Calculus of kidney: Secondary | ICD-10-CM | POA: Insufficient documentation

## 2013-05-05 DIAGNOSIS — I2510 Atherosclerotic heart disease of native coronary artery without angina pectoris: Secondary | ICD-10-CM | POA: Insufficient documentation

## 2013-05-06 ENCOUNTER — Telehealth: Payer: Self-pay | Admitting: Oncology

## 2013-05-06 ENCOUNTER — Ambulatory Visit (HOSPITAL_BASED_OUTPATIENT_CLINIC_OR_DEPARTMENT_OTHER): Payer: Medicare Other | Admitting: Oncology

## 2013-05-06 DIAGNOSIS — Z85828 Personal history of other malignant neoplasm of skin: Secondary | ICD-10-CM

## 2013-05-06 DIAGNOSIS — C3410 Malignant neoplasm of upper lobe, unspecified bronchus or lung: Secondary | ICD-10-CM

## 2013-05-06 NOTE — Telephone Encounter (Signed)
Gave pt appt for MD only on February 2015 , radiology will call pt to schedule Ct before MD visit

## 2013-05-06 NOTE — Progress Notes (Signed)
   [facility]    OFFICE PROGRESS NOTE   INTERVAL HISTORY:   He returns as scheduled. He feels well a side from a sore throat for the past week. He has noted difficulty swallowing food for the past few days. He reports undergoing a TUR by Dr. Patsi Sears.  Objective:  Vital signs in last 24 hours:  Blood pressure 114/64, pulse 79, temperature 99.3 F (37.4 C), temperature source Oral, resp. rate 18, height 5\' 3"  (1.6 m), weight 153 lb 3.2 oz (69.491 kg).    HEENT: Neck without mass, oropharynx without visible mass or exudate Lymphatics: No cervical, supraclavicular, or axillary nodes Resp: Lungs clear bilaterally Cardio: Regular rate and rhythm GI: No hepatosplenomegaly Vascular: No leg edema  Skin: Left chest wall scar without evidence of recurrent tumor , no evidence of recurrent tumor at the right face  X-rays: CT the chest 05/05/2013-status post left upper lobectomy. No evidence for local recurrence or metastatic disease.   Medications: I have reviewed the patient's current medications.  Assessment/Plan: 1. 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 Local recurrence of squamous cell carcinoma and a right face lymph node in 2006, status post surgical excision followed by radiation  3. Left hilar fullness on a chest x-ray 02/26/2012-stable compared to a chest x-ray from February of 2012 and new compared to a chest x-ray 2007 . A chest CT on 05/05/2012 confirmed a left upper lung nodule consistent with a primary bronchogenic carcinoma. A PET scan on 05/18/12 confirmed a hypermetabolic left upper lung nodule with no evidence of thoracic nodal or extrathoracic hypermetabolic disease  -He is status post a left upper lobectomy on 06/16/2012 with the pathology confirming a 3.8 cm squamous cell carcinoma (T2a, N0)  -Restaging CT 05/05/2013 without evidence of recurrent disease 4. History of BPH , status post a laser vaporization of  the prostate in February 2014 5. History of multiple skin cancers including basal cell carcinoma and squamous cell carcinomas  6. coronary artery disease, decreased left ventricular ejection fraction-followed by Dr. Mayford Knife  7. sore throat/dysphagia-he will followup with Dr. Valentina Lucks if the symptoms persist  Disposition:  He remains in clinical remission from non-small cell lung cancer and squamous cell carcinoma of the skin. He will return for an office visit and restaging CT in 9 months.   Thornton Papas, MD  05/06/2013  6:49 PM

## 2013-06-29 ENCOUNTER — Ambulatory Visit (INDEPENDENT_AMBULATORY_CARE_PROVIDER_SITE_OTHER): Payer: Medicare Other | Admitting: Cardiothoracic Surgery

## 2013-06-29 ENCOUNTER — Encounter: Payer: Self-pay | Admitting: Cardiothoracic Surgery

## 2013-06-29 VITALS — BP 133/75 | HR 72 | Resp 18 | Ht 63.0 in | Wt 153.0 lb

## 2013-06-29 DIAGNOSIS — Z902 Acquired absence of lung [part of]: Secondary | ICD-10-CM

## 2013-06-29 DIAGNOSIS — Z85118 Personal history of other malignant neoplasm of bronchus and lung: Secondary | ICD-10-CM

## 2013-06-29 DIAGNOSIS — Z9889 Other specified postprocedural states: Secondary | ICD-10-CM

## 2013-06-29 NOTE — Progress Notes (Signed)
[address]       [place] [address]             [phone]                                     [name] [place] Medical Record #[id number] Date of Birth: 1936-06-14  Ladene Artist, MD Lillia Mountain, MD  Chief Complaint:   PostOp Follow Up Visit 06/19/2012   OPERATIVE REPORT  PREOPERATIVE DIAGNOSIS: Left upper lobe lung mass.  POSTOPERATIVE DIAGNOSES: Left upper lobe lung mass, squamous cell  carcinoma, non-small cell carcinoma by frozen section.  PROCEDURE PERFORMED: Video bronchoscopy, left video-assisted  thoracoscopy with mini thoracotomy, resection of left upper lobe and  portion of superior segment left lower lobe and lymph node dissection.   Stage IB, (pT2a,pN0,cM0 SQUAMOUS CELL CARCINOMA, 3.8 CM.)  History of Present Illness:      Patient seems to be doing well following resection, his chest wall discomfort is almost resolved. Patient has returned to normal physical activities. Since last seen he has undergone prostate surgery, notes a better stream than previously but now has incontinence. His respiratory status has remained stable he denies any shortness of breath. He's had no anginal type chest pains or evidence of recurrent arrhythmia.  History  Smoking status  . Former Smoker  . Types: Cigarettes  . Quit date: 12/30/1965  Smokeless tobacco  . Never Used       Allergies  Allergen Reactions  . Lipitor (Atorvastatin) Other (See Comments)    Muscle pain    Current Outpatient Prescriptions  Medication Sig Dispense Refill  . aspirin 81 MG tablet Take 81 mg by mouth daily.      . carvedilol (COREG) 3.125 MG tablet Take 6.25 mg by mouth 2 (two) times daily.       . Cholecalciferol (VITAMIN D3) 1000 UNITS CAPS Take 1,000 Units by mouth daily.      . Coenzyme Q10 100 MG TABS Take 300 mg by mouth 1 day or 1 dose.      . Colesevelam HCl (WELCHOL) 3.75 G PACK Take 1 packet by mouth daily.      . fesoterodine  (TOVIAZ) 8 MG TB24 Take 8 mg by mouth daily.      . isosorbide mononitrate (IMDUR) 30 MG 24 hr tablet Take 1 tablet (30 mg total) by mouth daily.  30 tablet  11  . Multiple Vitamins-Minerals (CENTRUM PO) Take 1 tablet by mouth daily.      Ailene Ards 3-6-9 Fatty Acids (TRIPLE OMEGA COMPLEX PO) Take 1 capsule by mouth daily.      . pseudoephedrine (SUDAFED) 30 MG tablet Take 30 mg by mouth 2 (two) times daily.       No current facility-administered medications for this visit.       Physical Exam: BP 133/75  Pulse 72  Resp 18  Ht 5\' 3"  (1.6 m)  Wt 153 lb (69.4 kg)  BMI 27.11 kg/m2  SpO2 98%  General appearance: alert and cooperative Neurologic: intact Heart: regular rate and rhythm, S1, S2 normal, no murmur, click, rub or gallop and normal apical impulse Lungs: clear to auscultation bilaterally and normal percussion bilaterally Abdomen: soft, non-tender; bowel sounds normal; no masses,  no organomegaly Extremities: extremities normal, atraumatic, no cyanosis or edema and Homans sign is negative, no sign of DVT Wound: well healed  Wounds:no adenopathy  Diagnostic Studies & Laboratory data:         Recent Radiology Findings: CT scan from May 6 is reviewed/ there is no evidence of recurrent disease by CT   Recent Labs: Lab Results  Component Value Date   WBC 8.2 06/27/2012   HGB 15.0 02/08/2013   HCT 44.0 02/08/2013   PLT 272 06/27/2012   GLUCOSE 108* 02/08/2013   ALT 15 06/18/2012   AST 17 06/18/2012   NA 144 02/08/2013   K 4.1 02/08/2013   CL 101 06/27/2012   CREATININE 0.99 12/09/2012   BUN 23 12/09/2012   CO2 26 06/27/2012   TSH 5.698* 11/30/2007   INR 1.21 06/24/2012   Path: Diagnosis 1. Lymph node, biopsy, 10L - ANTHRACOTIC LYMPH NODE. NO TUMOR IDENTIFIED. 2. Lung, resection (segmental or lobe), Left upper lung - SQUAMOUS CELL CARCINOMA, 3.8 CM. - MARGINS NOT INVOLVED. - TWO BENIGN ANTHRACOTIC LYMPH NODES (0/2). 3. Lymph node, biopsy, 2L - ANTHRACOTIC LYMPH NODE. NO  TUMOR IDENTIFIED. 4. Lymph node, biopsy, 4L - ANTHRACOTIC LYMPH NODE. NO TUMOR IDENTIFIED. Microscopic Comment 2. LUNG Specimen, including laterality: Left upper lung lobe. Procedure: Lobectomy. Specimen integrity (intact/disrupted): Intact. Tumor site: Left upper lobe. Tumor focality: Unifocal. Maximum tumor size (cm): 3.8 cm. Histologic type: Squamous cell carcinoma. Grade: II. Margins: Free of tumor. Visceral pleural invasion: No. Tumor extension: Involves hilum of left upper lobe. Treatment effect (if treated with neoadjuvant therapy): No. Lymph -Vascular invasion: Not identified. Lymph nodes: Number examined - 5; Number N1 nodes positive 0; Number N2 nodes positive 0. TNM code: pT2a, pN0. Ancillary studies: Can be performed if requested. Non-neoplastic lung: Unremarkable. (JDP:eps 06/17/12)  Assessment / Plan:      Patient stable following left upper lobectomy for a squamous cell carcinoma stage Ib (pT2a, pN0.) Resected June of 2013. Plan to see him back in December of 2014 with a repeat CT scan of the chest, about 6 months .    [name] Ovens MD 06/29/2013 1:26 PM

## 2013-06-29 NOTE — Patient Instructions (Signed)
Ct scan of chest looks good Return in December 2014 for follow up ct of chest

## 2013-07-01 ENCOUNTER — Ambulatory Visit: Payer: Medicare Other | Admitting: Cardiothoracic Surgery

## 2013-10-01 IMAGING — CR DG CHEST 2V
2 series · 2 of 2 positions shown · non-contrast
Comparison: 05/18/2012 PET-CT

CLINICAL DATA: Preoperative radiograph

CHEST - 2 VIEW

[view not recorded (1 of 2)]
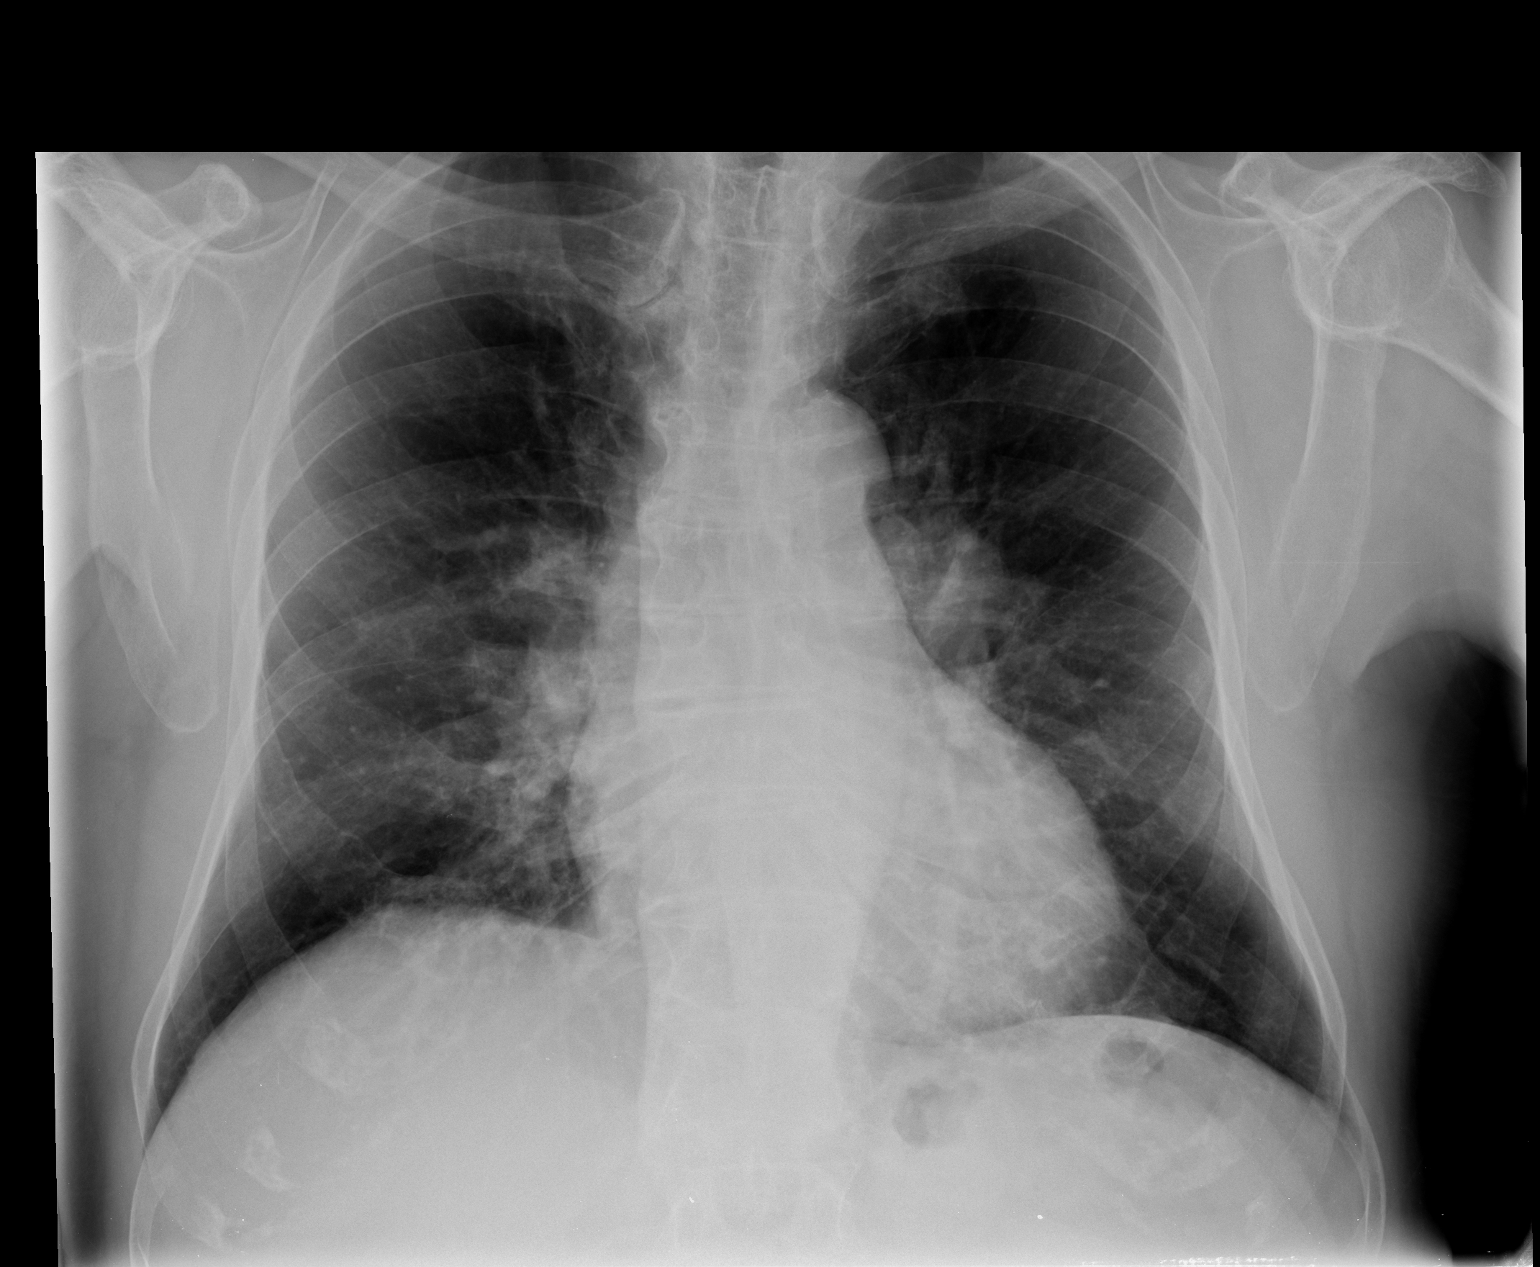

[view not recorded (2 of 2)]
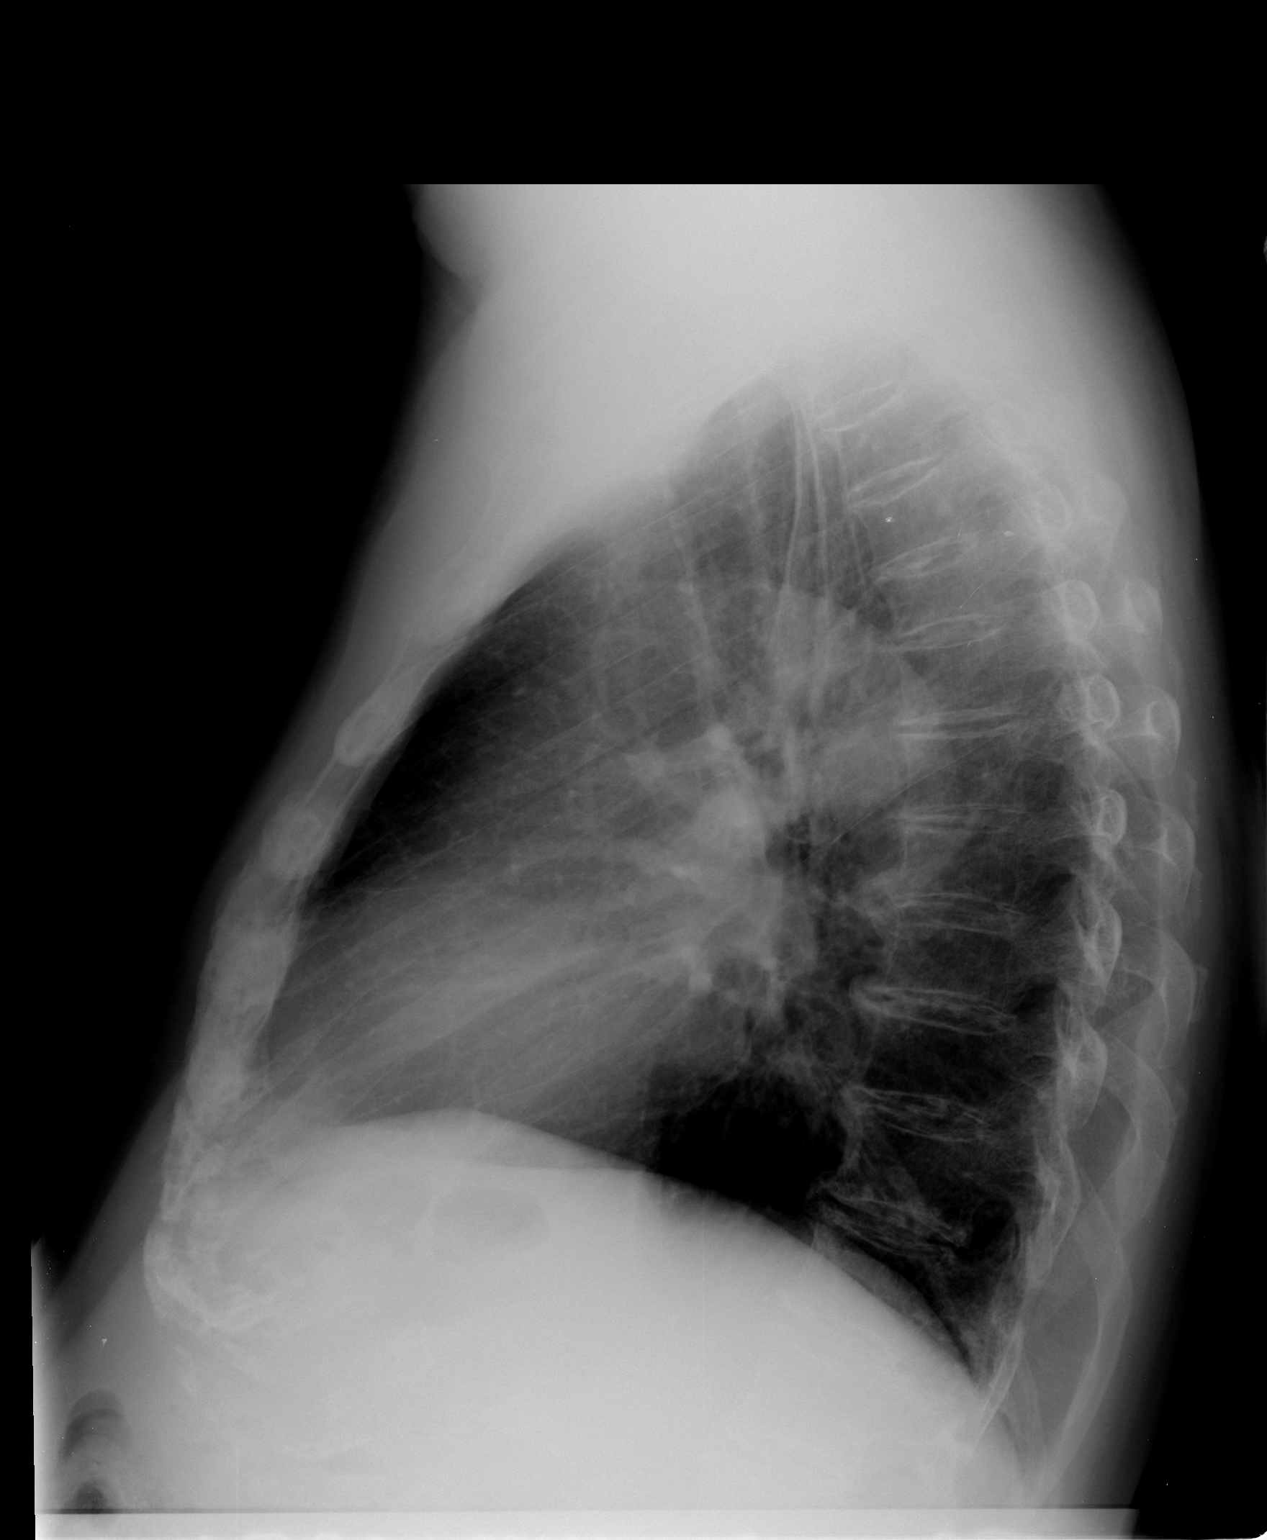

[2 of 2 positions shown; findings below may reference images not displayed]

FINDINGS: The known left hilar mass is without appreciable interval
change though partially obscured by normal hilar structures on this
radiograph.  Mild lung base atelectasis versus scarring. No pleural
effusion or pneumothorax.  No acute osseous finding.  Heart size
within normal limits.
IMPRESSION: The known left hilar mass is without appreciable interval change
allowing for differences in technique.

Mild lung base scarring versus atelectasis.

## 2013-10-07 ENCOUNTER — Encounter: Payer: Self-pay | Admitting: *Deleted

## 2013-10-08 ENCOUNTER — Encounter: Payer: Self-pay | Admitting: *Deleted

## 2013-10-09 ENCOUNTER — Encounter: Payer: Self-pay | Admitting: Cardiology

## 2013-10-09 DIAGNOSIS — E785 Hyperlipidemia, unspecified: Secondary | ICD-10-CM | POA: Insufficient documentation

## 2013-10-09 DIAGNOSIS — I255 Ischemic cardiomyopathy: Secondary | ICD-10-CM | POA: Insufficient documentation

## 2013-10-09 DIAGNOSIS — I10 Essential (primary) hypertension: Secondary | ICD-10-CM | POA: Insufficient documentation

## 2013-10-09 DIAGNOSIS — I493 Ventricular premature depolarization: Secondary | ICD-10-CM | POA: Insufficient documentation

## 2013-10-09 DIAGNOSIS — I2510 Atherosclerotic heart disease of native coronary artery without angina pectoris: Secondary | ICD-10-CM | POA: Insufficient documentation

## 2013-10-11 ENCOUNTER — Telehealth: Payer: Self-pay | Admitting: Cardiology

## 2013-10-11 NOTE — Telephone Encounter (Signed)
If he has not had lipids and CMET done in [facility] since a year ago please order.  If he has labs withing past year please print off of [facility] and scan into epic

## 2013-10-11 NOTE — Telephone Encounter (Signed)
New message   Want lab work today.  Please call and let pt know if he needs labs

## 2013-10-11 NOTE — Telephone Encounter (Signed)
Spoke w/wife. She states that he was suppose to have labs done prior to his app on Wed the 15th. Advised since they have merged with our practice the lab order may not have been transferred.  Dr. Mayford Knife is not in the office today so we can't get orders for lab.  Advised for him to come in fasting at his app on Wed 10/15 and labs can be drawn then.  Results will be called back to him after Dr. Mayford Knife has reviewed them. Understands and will have pt come in Wed fasting.

## 2013-10-11 NOTE — Telephone Encounter (Signed)
New problem    Pt would like to get his cholesterol tested but no order in system by 10/15 appt.    Please put order in system    Thanks!

## 2013-10-13 ENCOUNTER — Other Ambulatory Visit: Payer: Medicare Other

## 2013-10-13 ENCOUNTER — Encounter: Payer: Medicare Other | Admitting: Cardiology

## 2013-10-14 ENCOUNTER — Other Ambulatory Visit: Payer: Self-pay | Admitting: General Surgery

## 2013-10-14 DIAGNOSIS — E785 Hyperlipidemia, unspecified: Secondary | ICD-10-CM

## 2013-10-14 DIAGNOSIS — Z79899 Other long term (current) drug therapy: Secondary | ICD-10-CM

## 2013-10-14 NOTE — Telephone Encounter (Signed)
Spoke with pt he was set up to have lipid panel and hepatic panel on 11/01/13.

## 2013-10-14 NOTE — Telephone Encounter (Signed)
Please let patient know that we needed to recheck hepatic panel and lipids in November 2014

## 2013-10-14 NOTE — Telephone Encounter (Signed)
Pt had Statin Panel done on 07/12/13. His last Bmet was on 03/29/13. Do you still wants Lipid panel and CMET ordered for pt. If so for when?

## 2013-10-15 NOTE — Progress Notes (Signed)
This encounter was created in error - please disregard.

## 2013-11-01 ENCOUNTER — Other Ambulatory Visit: Payer: Self-pay

## 2013-11-08 ENCOUNTER — Ambulatory Visit: Payer: Self-pay | Admitting: Cardiology

## 2013-11-08 ENCOUNTER — Ambulatory Visit (INDEPENDENT_AMBULATORY_CARE_PROVIDER_SITE_OTHER): Payer: Medicare Other

## 2013-11-08 DIAGNOSIS — Z79899 Other long term (current) drug therapy: Secondary | ICD-10-CM

## 2013-11-08 DIAGNOSIS — E785 Hyperlipidemia, unspecified: Secondary | ICD-10-CM

## 2013-11-08 LAB — LIPID PANEL
Cholesterol: 214 mg/dL — ABNORMAL HIGH (ref 0–200)
VLDL: 29.2 mg/dL (ref 0.0–40.0)

## 2013-11-08 LAB — HEPATIC FUNCTION PANEL
ALT: 12 U/L (ref 0–53)
AST: 12 U/L (ref 0–37)
Albumin: 3.8 g/dL (ref 3.5–5.2)
Alkaline Phosphatase: 59 U/L (ref 39–117)
Total Protein: 7 g/dL (ref 6.0–8.3)

## 2013-11-15 ENCOUNTER — Ambulatory Visit: Payer: Self-pay | Admitting: Cardiology

## 2013-11-17 ENCOUNTER — Telehealth: Payer: Self-pay | Admitting: Pharmacist

## 2013-11-17 DIAGNOSIS — Z79899 Other long term (current) drug therapy: Secondary | ICD-10-CM

## 2013-11-17 DIAGNOSIS — E785 Hyperlipidemia, unspecified: Secondary | ICD-10-CM

## 2013-11-17 MED ORDER — CHOLESTYRAMINE 4 G PO PACK: 4.0000 g | PACK | Freq: Two times a day (BID) | ORAL | Status: DC

## 2013-11-17 MED ORDER — PSYLLIUM 28.3 % PO POWD: ORAL | Status: DC

## 2013-11-17 NOTE — Telephone Encounter (Signed)
Patient wasn't compliant with cholestyramine as he was worried about binding up certain medications.  I explained which medications may need to be separated from time of administration and for how long, and now patient feels comfortable taking Cholestyramine twice daily, and will take metamucil 2-3 times per day as well.  He will take his vitamins 1 hour prior to his morning cholestyramine dose.  He will need to get lipid panel and hepatic panel in 1 year (11/16/14).  Lab appointment and med list updated.

## 2013-11-18 ENCOUNTER — Encounter: Payer: Self-pay | Admitting: Cardiology

## 2013-11-18 ENCOUNTER — Ambulatory Visit (INDEPENDENT_AMBULATORY_CARE_PROVIDER_SITE_OTHER): Payer: Medicare Other | Admitting: Cardiology

## 2013-11-18 VITALS — BP 110/58 | HR 69 | Ht 64.0 in | Wt 154.0 lb

## 2013-11-18 DIAGNOSIS — I4949 Other premature depolarization: Secondary | ICD-10-CM

## 2013-11-18 DIAGNOSIS — I2589 Other forms of chronic ischemic heart disease: Secondary | ICD-10-CM

## 2013-11-18 DIAGNOSIS — E785 Hyperlipidemia, unspecified: Secondary | ICD-10-CM | POA: Insufficient documentation

## 2013-11-18 DIAGNOSIS — I5189 Other ill-defined heart diseases: Secondary | ICD-10-CM | POA: Insufficient documentation

## 2013-11-18 DIAGNOSIS — I2510 Atherosclerotic heart disease of native coronary artery without angina pectoris: Secondary | ICD-10-CM

## 2013-11-18 DIAGNOSIS — I10 Essential (primary) hypertension: Secondary | ICD-10-CM

## 2013-11-18 DIAGNOSIS — I493 Ventricular premature depolarization: Secondary | ICD-10-CM

## 2013-11-18 DIAGNOSIS — I255 Ischemic cardiomyopathy: Secondary | ICD-10-CM

## 2013-11-18 DIAGNOSIS — I429 Cardiomyopathy, unspecified: Secondary | ICD-10-CM | POA: Insufficient documentation

## 2013-11-18 DIAGNOSIS — I420 Dilated cardiomyopathy: Secondary | ICD-10-CM

## 2013-11-18 NOTE — Progress Notes (Addendum)
[address] [place], [place]  [address] Phone: [phone] Fax:  [phone]  Date:  11/18/2013   ID:  [name] [name], DOB 10-Sep-1936, MRN [id number]  PCP:  Lillia Mountain, MD  Cardiologist:  Armanda Magic, MD     History of Present Illness: [name] [name] is a 77 y.o. male with a histor of ASCAD, HTN, dyslipidemia and ischemic DCM.  He is doing well.  He denies any chest pain, SOB, DOE, LE edema, dizziness, palpitations or syncope.  He says that he has had some coughing recently and will get some chest wall pain with cough.  He denies any fever or chills.     Wt Readings from Last 3 Encounters:  11/18/13 154 lb (69.854 kg)  06/29/13 153 lb (69.4 kg)  05/06/13 153 lb 3.2 oz (69.491 kg)     Past Medical History  Diagnosis Date  . Lung mass     Stage 1B non-small cell lung CA s/p resection 2013  . BPH (benign prostatic hyperplasia)     [name]/elevated PSA, prostate biopsy x4 including one saturation biopsy, laser treatment 2/14  . Shingles   . Heart murmur     as a child  . Chronic kidney disease     kidney stones -small passed.  . Arthritis   . Trigger finger     Bilateral  . PVC (premature ventricular contraction)   . Cardiomyopathy     dilated cardiomyopathy EF 30%, MUGA  EF 42% 08/2012  . Squamous cell carcinoma, face     history of right face with mets to right upper cheek in 2004  and facial lymph node reoccurence post surgery with XRT  . Skin cancer     Multiple skin cancers   . Sigmoid diverticulosis   . Carotid artery occlusion     carotid artery bruit  . Hypertension   . Hyperlipidemia     statin intolerant  . CAD (coronary artery disease) 2013    cath 05/2012 showing 80-90% stenosis of a trifurcating diagonal #1, 70-80% stenosis of OM3 and 90% stenosis of distal LCx after OM3 - medical management, Turner  . Diastolic dysfunction     Current Outpatient Prescriptions  Medication Sig Dispense Refill  . aspirin 81 MG tablet Take 81  mg by mouth daily.      . carvedilol (COREG) 12.5 MG tablet Take 12.5 mg by mouth 2 (two) times daily with a meal.      . cholestyramine (QUESTRAN) 4 G packet Take 4 g by mouth 2 (two) times daily.      . isosorbide dinitrate (ISORDIL) 30 MG tablet Take 30 mg by mouth daily.      . Multiple Vitamins-Minerals (CENTRUM PO) Take 1 tablet by mouth daily.      Ailene Ards 3-6-9 Fatty Acids (TRIPLE OMEGA COMPLEX PO) Take 1 capsule by mouth daily.      . Psyllium (METAMUCIL) 28.3 % POWD 1 tablespoon mixed in 8 ounces of water 2-3 times per day       No current facility-administered medications for this visit.    Allergies:    Allergies  Allergen Reactions  . Lipitor [Atorvastatin] Other (See Comments)    Muscle pain  . Ramipril Cough    Social History:  The patient  reports that he quit smoking about 47 years ago. His smoking use included Cigarettes. He smoked 0.00 packs per day. He has never used smokeless tobacco. He reports that he drinks alcohol. He reports  that he does not use illicit drugs.   Family History:  The patient's family history includes Diabetes in his father; Heart disease in his child and mother; Lung cancer in his mother; Psoriasis in his child.   ROS:  Please see the history of present illness.      All other systems reviewed and negative.   PHYSICAL EXAM: VS:  BP 110/58  Pulse 69  Ht 5\' 4"  (1.626 m)  Wt 154 lb (69.854 kg)  BMI 26.42 kg/m2 Well nourished, well developed, in no acute distress HEENT: normal Neck: no JVD Cardiac:  normal S1, S2; RRR; no murmur Lungs:  clear to auscultation bilaterally, no wheezing, rhonchi or rales Abd: soft, nontender, no hepatomegaly Ext: no edema Skin: warm and dry Neuro:  CNs 2-12 intact, no focal abnormalities noted  EKG:     NSR with LVH and repol abnormality  ASSESSMENT AND PLAN:  1. ASCAD with no angina  - continue ASA/Imdur 2. HTN - controlled off meds 3. Dyslipidemia - he is statin intolerant  - He could not afford the  Welchol and is now on Metamucil 3 times daily and chlestyramine 4.   DCM  - continue Carvedilol 5.  Nonproductive cough with no fever or chills - I have recommended that he followup with his PCP for evaluation.  He has had a lot of sinus drainage so I suspect that is the etiology.  Followup with me in 6 months  Signed, Armanda Magic, MD 11/18/2013 9:50 PM

## 2013-11-18 NOTE — Patient Instructions (Addendum)
Your physician recommends that you continue on your current medications as directed. Please refer to the Current Medication list given to you today.  Your physician wants you to follow-up in: 6 Months with Dr Turner You will receive a reminder letter in the mail two months in advance. If you don't receive a letter, please call our office to schedule the follow-up appointment.  

## 2013-11-23 ENCOUNTER — Other Ambulatory Visit: Payer: Self-pay

## 2013-11-23 DIAGNOSIS — D381 Neoplasm of uncertain behavior of trachea, bronchus and lung: Secondary | ICD-10-CM

## 2013-11-30 ENCOUNTER — Ambulatory Visit (INDEPENDENT_AMBULATORY_CARE_PROVIDER_SITE_OTHER): Payer: Medicare Other | Admitting: Cardiothoracic Surgery

## 2013-11-30 ENCOUNTER — Encounter: Payer: Self-pay | Admitting: Cardiothoracic Surgery

## 2013-11-30 ENCOUNTER — Ambulatory Visit
Admission: RE | Admit: 2013-11-30 | Discharge: 2013-11-30 | Disposition: A | Discharge status: Home / Self Care | Payer: Medicare Other | Source: Ambulatory Visit | Attending: Cardiothoracic Surgery | Admitting: Cardiothoracic Surgery

## 2013-11-30 VITALS — BP 110/66 | HR 75 | Resp 20 | Ht 64.0 in | Wt 154.0 lb

## 2013-11-30 DIAGNOSIS — Z85118 Personal history of other malignant neoplasm of bronchus and lung: Secondary | ICD-10-CM

## 2013-11-30 DIAGNOSIS — Z9889 Other specified postprocedural states: Secondary | ICD-10-CM

## 2013-11-30 DIAGNOSIS — Z902 Acquired absence of lung [part of]: Secondary | ICD-10-CM

## 2013-11-30 DIAGNOSIS — D381 Neoplasm of uncertain behavior of trachea, bronchus and lung: Secondary | ICD-10-CM

## 2013-11-30 NOTE — Progress Notes (Signed)
[address]       [place] [address]             [phone]                               [name] [place] Medical Record #[id number] Date of Birth: [birth year]  Ladene Artist, MD Lillia Mountain, MD  Chief Complaint:   PostOp Follow Up Visit 06/19/2012  OPERATIVE REPORT  PREOPERATIVE DIAGNOSIS: Left upper lobe lung mass.  POSTOPERATIVE DIAGNOSES: Left upper lobe lung mass, squamous cell  carcinoma, non-small cell carcinoma by frozen section.  PROCEDURE PERFORMED: Video bronchoscopy, left video-assisted  thoracoscopy with mini thoracotomy, resection of left upper lobe and  portion of superior segment left lower lobe and lymph node dissection.   Stage IB, (pT2a,pN0,cM0 SQUAMOUS CELL CARCINOMA, 3.8 CM.)  History of Present Illness:      Patient seems to be doing well following resection he notes some SOB walking up hill with a ladder. Patient has returned to normal physical activities. He's had no anginal type chest pains or evidence of recurrent arrhythmia. Patient returns today for a followup visit and a followup CT scan now 18 months after resection of stage IB squamous cell carcinoma.  History  Smoking status  . Former Smoker  . Types: Cigarettes  . Quit date: 12/30/1965  Smokeless tobacco  . Never Used       Allergies  Allergen Reactions  . Lipitor [Atorvastatin] Other (See Comments)    Muscle pain  . Ramipril Cough    Current Outpatient Prescriptions  Medication Sig Dispense Refill  . aspirin 81 MG tablet Take 81 mg by mouth daily.      . carvedilol (COREG) 12.5 MG tablet Take 12.5 mg by mouth 2 (two) times daily with a meal.      . cholestyramine (QUESTRAN) 4 G packet Take 4 g by mouth 3 (three) times daily.       . isosorbide dinitrate (ISORDIL) 30 MG tablet Take 30 mg by mouth daily.      . Multiple Vitamins-Minerals (CENTRUM PO) Take 1 tablet by mouth daily.      . Omega-3 Fatty Acids (FISH OIL) 1000 MG CAPS Take  by mouth daily.       No current facility-administered medications for this visit.       Physical Exam: BP 110/66  Pulse 75  Resp 20  Ht 5\' 4"  (1.626 m)  Wt 154 lb (69.854 kg)  BMI 26.42 kg/m2  SpO2 98%  General appearance: alert and cooperative Neurologic: intact Heart: regular rate and rhythm, S1, S2 normal, no murmur, click, rub or gallop and normal apical impulse Lungs: clear to auscultation bilaterally and normal percussion bilaterally Abdomen: soft, non-tender; bowel sounds normal; no masses,  no organomegaly Extremities: extremities normal, atraumatic, no cyanosis or edema and Homans sign is negative, no sign of DVT Wound: well healed Wounds:no adenopathy  Diagnostic Studies & Laboratory data:         Recent Radiology Findings: Ct Chest Wo Contrast  11/30/2013   CLINICAL DATA:  Smoker for 50 years. Left lung cancer June 2013. No chest complaints.  EXAM: CT CHEST WITHOUT CONTRAST  TECHNIQUE: Multidetector CT imaging of the chest was performed following the standard protocol without IV contrast.  COMPARISON:  05/05/2013  FINDINGS: The central airways are patent. There is left lung volume loss. There are fibrotic changes  at the left lung weighs from prior surgery. There is no new pulmonary mass or nodule. There is no pleural effusion or pneumothorax.  There are no pathologically enlarged axillary, hilar or mediastinal lymph nodes.  The heart size is normal. There is no pericardial effusion. The thoracic aorta is normal in caliber. There is coronary artery atherosclerosis involving the LAD, circumflex and right coronary artery.  Review of bone windows demonstrates no focal lytic or sclerotic lesions. There is lower cervical spine degenerative disc disease.  Limited non-contrast images of the upper abdomen were obtained. The adrenal glands appear normal. There is left nephrolithiasis.  IMPRESSION: 1. No evidence of residual or recurrent chest malignancy. Posttreatment changes from  prior left upper lobectomy again noted.  2.  Multi-vessel coronary artery disease.  3.  Left nephrolithiasis.   Electronically Signed   By: Elige Ko   On: 11/30/2013 15:39    Recent Labs: Lab Results  Component Value Date   WBC 8.2 06/27/2012   HGB 15.0 02/08/2013   HCT 44.0 02/08/2013   PLT 272 06/27/2012   GLUCOSE 108* 02/08/2013   CHOL 214* 11/08/2013   TRIG 146.0 11/08/2013   HDL 46.90 11/08/2013   LDLDIRECT 147.6 11/08/2013   ALT 12 11/08/2013   AST 12 11/08/2013   NA 144 02/08/2013   K 4.1 02/08/2013   CL 101 06/27/2012   CREATININE 0.99 12/09/2012   BUN 23 12/09/2012   CO2 26 06/27/2012   TSH 5.698* 11/30/2007   INR 1.21 06/24/2012   Path: Diagnosis 1. Lymph node, biopsy, 10L - ANTHRACOTIC LYMPH NODE. NO TUMOR IDENTIFIED. 2. Lung, resection (segmental or lobe), Left upper lung - SQUAMOUS CELL CARCINOMA, 3.8 CM. - MARGINS NOT INVOLVED. - TWO BENIGN ANTHRACOTIC LYMPH NODES (0/2). 3. Lymph node, biopsy, 2L - ANTHRACOTIC LYMPH NODE. NO TUMOR IDENTIFIED. 4. Lymph node, biopsy, 4L - ANTHRACOTIC LYMPH NODE. NO TUMOR IDENTIFIED. Microscopic Comment 2. LUNG Specimen, including laterality: Left upper lung lobe. Procedure: Lobectomy. Specimen integrity (intact/disrupted): Intact. Tumor site: Left upper lobe. Tumor focality: Unifocal. Maximum tumor size (cm): 3.8 cm. Histologic type: Squamous cell carcinoma. Grade: II. Margins: Free of tumor. Visceral pleural invasion: No. Tumor extension: Involves hilum of left upper lobe. Treatment effect (if treated with neoadjuvant therapy): No. Lymph -Vascular invasion: Not identified. Lymph nodes: Number examined - 5; Number N1 nodes positive 0; Number N2 nodes positive 0. TNM code: pT2a, pN0. Ancillary studies: Can be performed if requested. Non-neoplastic lung: Unremarkable. (JDP:eps 06/17/12)  Assessment / Plan:      Patient stable following left upper lobectomy for a squamous cell carcinoma stage Ib (pT2a, pN0.) Resected June  of 2013. Plan to see him back in June of 2014 with a repeat CT scan of the chest, about 6 months .  Delight Ovens MD 11/30/2013 5:29 PM

## 2013-12-16 ENCOUNTER — Ambulatory Visit: Payer: Medicare Other | Admitting: Cardiothoracic Surgery

## 2014-01-14 ENCOUNTER — Telehealth: Payer: Self-pay | Admitting: *Deleted

## 2014-01-14 NOTE — Telephone Encounter (Signed)
Message from pt reporting he had chest CT 12/2 with Dr. Servando Snare. Scheduled for CT 2/9, asking to cancel upcoming scan. Reviewed with Dr. Benay Spice, scan canceled.

## 2014-01-20 ENCOUNTER — Other Ambulatory Visit: Payer: Self-pay

## 2014-01-20 MED ORDER — CARVEDILOL 12.5 MG PO TABS: 12.5000 mg | ORAL_TABLET | Freq: Two times a day (BID) | ORAL | Status: DC

## 2014-01-20 MED ORDER — ISOSORBIDE DINITRATE 30 MG PO TABS: 30.0000 mg | ORAL_TABLET | Freq: Every day | ORAL | Status: DC

## 2014-01-21 ENCOUNTER — Other Ambulatory Visit: Payer: Self-pay

## 2014-01-21 MED ORDER — CARVEDILOL 12.5 MG PO TABS: 12.5000 mg | ORAL_TABLET | Freq: Two times a day (BID) | ORAL | Status: DC

## 2014-01-21 MED ORDER — ISOSORBIDE DINITRATE 30 MG PO TABS: 30.0000 mg | ORAL_TABLET | Freq: Every day | ORAL | Status: DC

## 2014-02-01 ENCOUNTER — Telehealth: Payer: Self-pay | Admitting: Cardiology

## 2014-02-01 MED ORDER — ISOSORBIDE MONONITRATE ER 30 MG PO TB24: 30.0000 mg | ORAL_TABLET | Freq: Every day | ORAL | Status: DC

## 2014-02-01 NOTE — Telephone Encounter (Signed)
New Prob    Pt has some questions regarding a refill for Isosorbide that was sent over to pharmacy but reportedly changed. Pt is calling for clarification. Please call.

## 2014-02-01 NOTE — Telephone Encounter (Signed)
Updated med list and new RX Sent in for pt.

## 2014-02-03 ENCOUNTER — Other Ambulatory Visit (HOSPITAL_COMMUNITY): Payer: Medicare Other

## 2014-02-07 ENCOUNTER — Ambulatory Visit (HOSPITAL_BASED_OUTPATIENT_CLINIC_OR_DEPARTMENT_OTHER): Payer: Medicare Other | Admitting: Oncology

## 2014-02-07 ENCOUNTER — Telehealth: Payer: Self-pay | Admitting: Oncology

## 2014-02-07 VITALS — BP 126/52 | HR 66 | Temp 97.3°F | Resp 18 | Ht 64.0 in | Wt 158.5 lb

## 2014-02-07 DIAGNOSIS — C3410 Malignant neoplasm of upper lobe, unspecified bronchus or lung: Secondary | ICD-10-CM

## 2014-02-07 DIAGNOSIS — Z85828 Personal history of other malignant neoplasm of skin: Secondary | ICD-10-CM

## 2014-02-07 NOTE — Telephone Encounter (Signed)
gv adn printed appt sched and avs for pt for OCT

## 2014-02-07 NOTE — Progress Notes (Signed)
   [facility]    OFFICE PROGRESS NOTE   INTERVAL HISTORY:   He returns for scheduled followup of non-small cell lung cancer. He feels well. Dyspnea with heavy exertion. Good appetite. No pain. A restaging CT of the chest on 11/30/2013 was negative.  Objective:  Vital signs in last 24 hours:  Blood pressure 126/52, pulse 66, temperature 97.3 F (36.3 C), temperature source Oral, resp. rate 18, height 5\' 4"  (1.626 m), weight 158 lb 8 oz (71.895 kg), SpO2 98.00%.    HEENT: Post radiation changes over the right face and neck. No mass. Oropharynx without visible mass. Lymphatics: No cervical, supraclavicular, or axillary nodes Resp: Lungs clear bilaterally Cardio: Regular rate and rhythm GI: No hepatosplenomegaly Vascular: No leg edema    Medications: I have reviewed the patient's current medications.  Assessment/Plan: 1. 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 Local recurrence of squamous cell carcinoma and a right face lymph node in 2006, status post surgical excision followed by radiation  3. Left hilar fullness on a chest x-ray 02/26/2012-stable compared to a chest x-ray from February of 2012 and new compared to a chest x-ray 2007 . A chest CT on 05/05/2012 confirmed a left upper lung nodule consistent with a primary bronchogenic carcinoma. A PET scan on 05/18/12 confirmed a hypermetabolic left upper lung nodule with no evidence of thoracic nodal or extrathoracic hypermetabolic disease  -He is status post a left upper lobectomy on 06/16/2012 with the pathology confirming a 3.8 cm squamous cell carcinoma (T2a, N0)  -Restaging CT 11/30/2013 without evidence of recurrent disease 4. History of BPH , status post a laser vaporization of the prostate in February 2014  5. History of multiple skin cancers including basal cell carcinoma and squamous cell carcinomas  6. coronary artery disease, decreased left ventricular ejection  fraction-followed by Dr. Radford Pax        Disposition:  [name] [name] remains in clinical remission from non-small cell lung cancer. He will see Dr. Servando Snare with a repeat chest CT in June of 2015. He will return for an office visit here in 8 months.   Betsy Coder, MD  02/07/2014  9:08 AM

## 2014-04-20 ENCOUNTER — Other Ambulatory Visit: Payer: Self-pay | Admitting: Dermatology

## 2014-04-20 ENCOUNTER — Other Ambulatory Visit: Payer: Self-pay | Admitting: *Deleted

## 2014-04-20 MED ORDER — CHOLESTYRAMINE 4 G PO PACK: 4.0000 g | PACK | Freq: Two times a day (BID) | ORAL | Status: DC

## 2014-04-27 ENCOUNTER — Other Ambulatory Visit: Payer: Self-pay | Admitting: *Deleted

## 2014-04-27 DIAGNOSIS — C3410 Malignant neoplasm of upper lobe, unspecified bronchus or lung: Secondary | ICD-10-CM

## 2014-05-26 ENCOUNTER — Ambulatory Visit
Admission: RE | Admit: 2014-05-26 | Discharge: 2014-05-26 | Disposition: A | Discharge status: Home / Self Care | Payer: Medicare Other | Source: Ambulatory Visit | Attending: Internal Medicine | Admitting: Internal Medicine

## 2014-05-26 ENCOUNTER — Other Ambulatory Visit: Payer: Self-pay | Admitting: Internal Medicine

## 2014-05-26 DIAGNOSIS — R059 Cough, unspecified: Secondary | ICD-10-CM

## 2014-05-26 DIAGNOSIS — R05 Cough: Secondary | ICD-10-CM

## 2014-06-08 ENCOUNTER — Other Ambulatory Visit: Payer: Self-pay

## 2014-06-08 MED ORDER — CARVEDILOL 12.5 MG PO TABS: 12.5000 mg | ORAL_TABLET | Freq: Two times a day (BID) | ORAL | Status: DC

## 2014-06-16 ENCOUNTER — Ambulatory Visit
Admission: RE | Admit: 2014-06-16 | Discharge: 2014-06-16 | Disposition: A | Discharge status: Home / Self Care | Payer: Medicare Other | Source: Ambulatory Visit | Attending: Cardiothoracic Surgery | Admitting: Cardiothoracic Surgery

## 2014-06-16 ENCOUNTER — Ambulatory Visit (INDEPENDENT_AMBULATORY_CARE_PROVIDER_SITE_OTHER): Payer: Medicare Other | Admitting: Cardiothoracic Surgery

## 2014-06-16 ENCOUNTER — Encounter: Payer: Self-pay | Admitting: Cardiothoracic Surgery

## 2014-06-16 VITALS — BP 91/57 | HR 69 | Resp 16 | Ht 65.0 in | Wt 150.0 lb

## 2014-06-16 DIAGNOSIS — Z902 Acquired absence of lung [part of]: Secondary | ICD-10-CM

## 2014-06-16 DIAGNOSIS — C3410 Malignant neoplasm of upper lobe, unspecified bronchus or lung: Secondary | ICD-10-CM

## 2014-06-16 DIAGNOSIS — Z9889 Other specified postprocedural states: Secondary | ICD-10-CM

## 2014-06-16 NOTE — Progress Notes (Signed)
[address]       [place] [address]             [phone]                               Armany H Munsch [place] Medical Record #[id number] Date of Birth: [birth year]  Ladell Pier, MD Irven Shelling, MD  Chief Complaint:   PostOp Follow Up Visit 06/19/2012  OPERATIVE REPORT  PREOPERATIVE DIAGNOSIS: Left upper lobe lung mass.  POSTOPERATIVE DIAGNOSES: Left upper lobe lung mass, squamous cell  carcinoma, non-small cell carcinoma by frozen section.  PROCEDURE PERFORMED: Video bronchoscopy, left video-assisted  thoracoscopy with mini thoracotomy, resection of left upper lobe and  portion of superior segment left lower lobe and lymph node dissection.   Stage IB, (pT2a,pN0,cM0 SQUAMOUS CELL CARCINOMA, 3.8 CM.)  History of Present Illness:      Patient seems to be doing well following resection he notes some SOB walking up hill with a ladder. Patient has returned to normal physical activities. He's had no anginal type chest pains or evidence of recurrent arrhythmia. Patient returns today for a followup visit and a followup CT scan now 24 months after resection of stage IB squamous cell carcinoma. Patient notes recently being treated for urinary tract infection.  History  Smoking status  . Former Smoker  . Types: Cigarettes  . Quit date: 12/30/1965  Smokeless tobacco  . Never Used       Allergies  Allergen Reactions  . Lipitor [Atorvastatin] Other (See Comments)    Muscle pain  . Ramipril Cough    Current Outpatient Prescriptions  Medication Sig Dispense Refill  . aspirin 81 MG tablet Take 81 mg by mouth daily.      Marland Kitchen b complex vitamins tablet Take 1 tablet by mouth daily.      . carvedilol (COREG) 12.5 MG tablet Take 1 tablet (12.5 mg total) by mouth 2 (two) times daily with a meal.  180 tablet  1  . cholestyramine (QUESTRAN) 4 G packet Take 1 packet (4 g total) by mouth 2 (two) times daily.  180 each  0  . ezetimibe (ZETIA) 10 MG  tablet Take 10 mg by mouth daily.      . isosorbide mononitrate (IMDUR) 30 MG 24 hr tablet Take 1 tablet (30 mg total) by mouth daily.  90 tablet  3  . Multiple Vitamins-Minerals (CENTRUM PO) Take 1 tablet by mouth daily.      . Omega-3 Fatty Acids (FISH OIL) 1000 MG CAPS Take by mouth daily.      . psyllium (METAMUCIL) 58.6 % powder Take 1 packet by mouth 3 (three) times daily.       No current facility-administered medications for this visit.       Physical Exam: BP 91/57  Pulse 69  Resp 16  Ht 5\' 5"  (1.651 m)  Wt 150 lb (68.04 kg)  BMI 24.96 kg/m2  SpO2 96%  General appearance: alert and cooperative Neurologic: intact Heart: regular rate and rhythm, S1, S2 normal, no murmur, click, rub or gallop and normal apical impulse Lungs: clear to auscultation bilaterally and normal percussion bilaterally Abdomen: soft, non-tender; bowel sounds normal; no masses,  no organomegaly Extremities: extremities normal, atraumatic, no cyanosis or edema and Homans sign is negative, no sign of DVT Wound: well healed Wounds:no adenopathy  Diagnostic Studies & Laboratory data:  Recent Radiology Findings:  Ct Chest Wo Contrast  06/16/2014   CLINICAL DATA:  History of lung cancer status post left upper lobectomy in June of 2013. Cough.  EXAM: CT CHEST WITHOUT CONTRAST  TECHNIQUE: Multidetector CT imaging of the chest was performed following the standard protocol without IV contrast.  COMPARISON:  Chest CT 11/30/2013.  FINDINGS: Mediastinum: Heart size is normal. There is no significant pericardial fluid, thickening or pericardial calcification. There is atherosclerosis of the thoracic aorta, the great vessels of the mediastinum and the coronary arteries, including calcified atherosclerotic plaque in the left main, left anterior descending, left circumflex and right coronary arteries. No pathologically enlarged mediastinal or hilar lymph nodes. Please note that accurate exclusion of hilar  adenopathy is limited on noncontrast CT scans. Esophagus is unremarkable in appearance.  Lungs/Pleura: Status post left upper lobectomy. Mild scarring in the lateral aspect of the left lower lobe, similar to prior examinations. There is also some pleuroparenchymal thickening and architectural distortion in the right apex, unchanged, presumably chronic scarring. No suspicious appearing pulmonary nodules or masses. No acute consolidative airspace disease. No pleural effusions.  Upper Abdomen: Tiny 2-3 mm nonobstructive calculi are present within the lower pole collecting system of the left kidney. The larger calculi in this region noted on the prior examination from 11/30/2013 are no longer identified. Additionally, there now appears to be some mild proximal hydroureteronephrosis, which could indicate distal passage of the previously noted stones and associated mild left ureteral obstruction.  Musculoskeletal: There are no aggressive appearing lytic or blastic lesions noted in the visualized portions of the skeleton. Postthoracotomy changes in the left hemithorax again noted.  IMPRESSION: 1. Status post left upper lobectomy without findings to suggest local recurrence of disease or metastatic disease in the thorax. 2. Although there are tiny nonobstructive calculi in the lower pole collecting system of the left kidney, the larger stones noted in this region on the prior examination are no longer identified, and there is new mild left hydroureteronephrosis. Clinical correlation for signs and symptoms of left-sided renal colic and urinary tract obstruction is recommended. Further evaluation with noncontrast CT of the abdomen and pelvis may provide additional diagnostic information if clinically appropriate. 3. Atherosclerosis, including left main and 3 vessel coronary artery disease. Assessment for potential risk factor modification, dietary therapy or pharmacologic therapy may be warranted, if clinically indicated. 4.  Additional incidental findings, as above. These results will be called to the ordering clinician or representative by the Radiologist Assistant, and communication documented in the PACS or zVision Dashboard.   Electronically Signed   By: Vinnie Langton M.D.   On: 06/16/2014 11:10  Ct Chest Wo Contrast  11/30/2013   CLINICAL DATA:  Smoker for 50 years. Left lung cancer June 2013. No chest complaints.  EXAM: CT CHEST WITHOUT CONTRAST  TECHNIQUE: Multidetector CT imaging of the chest was performed following the standard protocol without IV contrast.  COMPARISON:  05/05/2013  FINDINGS: The central airways are patent. There is left lung volume loss. There are fibrotic changes at the left lung weighs from prior surgery. There is no new pulmonary mass or nodule. There is no pleural effusion or pneumothorax.  There are no pathologically enlarged axillary, hilar or mediastinal lymph nodes.  The heart size is normal. There is no pericardial effusion. The thoracic aorta is normal in caliber. There is coronary artery atherosclerosis involving the LAD, circumflex and right coronary artery.  Review of bone windows demonstrates no focal lytic or sclerotic lesions. There is lower  cervical spine degenerative disc disease.  Limited non-contrast images of the upper abdomen were obtained. The adrenal glands appear normal. There is left nephrolithiasis.  IMPRESSION: 1. No evidence of residual or recurrent chest malignancy. Posttreatment changes from prior left upper lobectomy again noted.  2.  Multi-vessel coronary artery disease.  3.  Left nephrolithiasis.   Electronically Signed   By: Kathreen Devoid   On: 11/30/2013 15:39    Recent Labs: Lab Results  Component Value Date   WBC 8.2 06/27/2012   HGB 15.0 02/08/2013   HCT 44.0 02/08/2013   PLT 272 06/27/2012   GLUCOSE 108* 02/08/2013   CHOL 214* 11/08/2013   TRIG 146.0 11/08/2013   HDL 46.90 11/08/2013   LDLDIRECT 147.6 11/08/2013   ALT 12 11/08/2013   AST 12 11/08/2013    NA 144 02/08/2013   K 4.1 02/08/2013   CL 101 06/27/2012   CREATININE 0.99 12/09/2012   BUN 23 12/09/2012   CO2 26 06/27/2012   TSH 5.698* 11/30/2007   INR 1.21 06/24/2012   Path: Diagnosis 1. Lymph node, biopsy, 10L - ANTHRACOTIC LYMPH NODE. NO TUMOR IDENTIFIED. 2. Lung, resection (segmental or lobe), Left upper lung - SQUAMOUS CELL CARCINOMA, 3.8 CM. - MARGINS NOT INVOLVED. - TWO BENIGN ANTHRACOTIC LYMPH NODES (0/2). 3. Lymph node, biopsy, 2L - ANTHRACOTIC LYMPH NODE. NO TUMOR IDENTIFIED. 4. Lymph node, biopsy, 4L - ANTHRACOTIC LYMPH NODE. NO TUMOR IDENTIFIED. Microscopic Comment 2. LUNG Specimen, including laterality: Left upper lung lobe. Procedure: Lobectomy. Specimen integrity (intact/disrupted): Intact. Tumor site: Left upper lobe. Tumor focality: Unifocal. Maximum tumor size (cm): 3.8 cm. Histologic type: Squamous cell carcinoma. Grade: II. Margins: Free of tumor. Visceral pleural invasion: No. Tumor extension: Involves hilum of left upper lobe. Treatment effect (if treated with neoadjuvant therapy): No. Lymph -Vascular invasion: Not identified. Lymph nodes: Number examined - 5; Number N1 nodes positive 0; Number N2 nodes positive 0. TNM code: pT2a, pN0. Ancillary studies: Can be performed if requested. Non-neoplastic lung: Unremarkable. (JDP:eps 06/17/12)  Assessment / Plan:      Patient stable following left upper lobectomy for a squamous cell carcinoma stage Ib (pT2a, pN0.) Resected June of 2013. CT scan of the chest shows no evidence of recurrent carcinoma. CT scan does note change in previous identified renal stones and question of mild left hydronephrosis, patient notes he's been recently treated for urinary tract infection and has followup appointment with her Medicare and with urology, results of the CT scan will be forwarded to both. Plan to see him back in Dec 2015 with a repeat CT scan of the chest, about 6 months .  Grace Isaac MD 06/16/2014 1:16  PM

## 2014-07-28 ENCOUNTER — Other Ambulatory Visit: Payer: Self-pay | Admitting: Urology

## 2014-08-15 ENCOUNTER — Encounter (HOSPITAL_COMMUNITY): Payer: Self-pay | Admitting: Pharmacy Technician

## 2014-08-17 ENCOUNTER — Encounter (HOSPITAL_COMMUNITY)
Admission: RE | Admit: 2014-08-17 | Discharge: 2014-08-17 | Disposition: A | Discharge status: Home / Self Care | Payer: Medicare Other | Source: Ambulatory Visit | Attending: Urology | Admitting: Urology

## 2014-08-17 ENCOUNTER — Encounter (HOSPITAL_COMMUNITY): Payer: Self-pay

## 2014-08-17 DIAGNOSIS — N201 Calculus of ureter: Secondary | ICD-10-CM | POA: Diagnosis present

## 2014-08-17 DIAGNOSIS — N400 Enlarged prostate without lower urinary tract symptoms: Secondary | ICD-10-CM | POA: Diagnosis present

## 2014-08-17 DIAGNOSIS — Z01818 Encounter for other preprocedural examination: Secondary | ICD-10-CM | POA: Diagnosis present

## 2014-08-17 LAB — CBC
HCT: 42.1 % (ref 39.0–52.0)
Hemoglobin: 14 g/dL (ref 13.0–17.0)
MCH: 30 pg (ref 26.0–34.0)
MCHC: 33.3 g/dL (ref 30.0–36.0)
MCV: 90.3 fL (ref 78.0–100.0)
PLATELETS: 179 10*3/uL (ref 150–400)
RBC: 4.66 MIL/uL (ref 4.22–5.81)
RDW: 13.6 % (ref 11.5–15.5)
WBC: 5.1 10*3/uL (ref 4.0–10.5)

## 2014-08-17 LAB — BASIC METABOLIC PANEL
ANION GAP: 13 (ref 5–15)
BUN: 18 mg/dL (ref 6–23)
CALCIUM: 9.1 mg/dL (ref 8.4–10.5)
CO2: 25 mEq/L (ref 19–32)
Chloride: 103 mEq/L (ref 96–112)
Creatinine, Ser: 0.93 mg/dL (ref 0.50–1.35)
GFR, EST NON AFRICAN AMERICAN: 80 mL/min — AB (ref >90)
Glucose, Bld: 140 mg/dL — ABNORMAL HIGH (ref 70–99)
POTASSIUM: 4.1 meq/L (ref 3.7–5.3)
SODIUM: 141 meq/L (ref 137–147)

## 2014-08-17 NOTE — Patient Instructions (Signed)
YOUR SURGERY IS SCHEDULED AT [facility]  ON:  Monday  8/24  REPORT TO  SHORT STAY CENTER AT:  9:00 AM   PLEASE COME IN THE [facility] MAIN HOSPITAL ENTRANCE AND FOLLOW SIGNS TO SHORT STAY CENTER.  DO NOT EAT OR DRINK ANYTHING AFTER MIDNIGHT THE NIGHT BEFORE YOUR SURGERY.  YOU MAY BRUSH YOUR TEETH, RINSE OUT YOUR MOUTH--BUT NO WATER, NO FOOD, NO CHEWING GUM, NO MINTS, NO CANDIES, NO CHEWING TOBACCO.  PLEASE TAKE THE FOLLOWING MEDICATIONS THE AM OF YOUR SURGERY WITH A FEW SIPS OF WATER:  CARVEDILOL, ISOSORBIDE.  DO NOT BRING VALUABLES, MONEY, CREDIT CARDS.  DO NOT WEAR JEWELRY, MAKE-UP, NAIL POLISH AND NO METAL PINS OR CLIPS IN YOUR HAIR. CONTACT LENS, DENTURES / PARTIALS, GLASSES SHOULD NOT BE WORN TO SURGERY AND IN MOST CASES-HEARING AIDS WILL NEED TO BE REMOVED.  BRING YOUR GLASSES CASE, ANY EQUIPMENT NEEDED FOR YOUR CONTACT LENS. FOR PATIENTS ADMITTED TO THE HOSPITAL--CHECK OUT TIME THE DAY OF DISCHARGE IS 11:00 AM.  ALL INPATIENT ROOMS ARE PRIVATE - WITH BATHROOM, TELEPHONE, TELEVISION AND WIFI INTERNET.                                                    PLEASE READ OVER ANY  FACT SHEETS THAT YOU WERE GIVEN: MRSA INFORMATION, BLOOD TRANSFUSION INFORMATION, INCENTIVE SPIROMETER INFORMATION.  PLEASE BE AWARE THAT YOU MAY NEED ADDITIONAL BLOOD DRAWN DAY OF YOUR SURGERY  _______________________________________________________________________   [facility] - Preparing for Surgery Before surgery, you can play an important role.  Because skin is not sterile, your skin needs to be as free of germs as possible.  You can reduce the number of germs on your skin by washing with CHG (chlorahexidine gluconate) soap before surgery.  CHG is an antiseptic cleaner which kills germs and bonds with the skin to continue killing germs even after washing. Please DO NOT use if you have an allergy to CHG or antibacterial soaps.  If your skin becomes reddened/irritated stop using the CHG and inform  your nurse when you arrive at Short Stay. Do not shave (including legs and underarms) for at least 48 hours prior to the first CHG shower.  You may shave your face/neck. Please follow these instructions carefully:  1.  Shower with CHG Soap the night before surgery and the  morning of Surgery.  2.  If you choose to wash your hair, wash your hair first as usual with your  normal  shampoo.  3.  After you shampoo, rinse your hair and body thoroughly to remove the  shampoo.                           4.  Use CHG as you would any other liquid soap.  You can apply chg directly  to the skin and wash                       Gently with a scrungie or clean washcloth.  5.  Apply the CHG Soap to your body ONLY FROM THE NECK DOWN.   Do not use on face/ open                           Wound or open  sores. Avoid contact with eyes, ears mouth and genitals (private parts).                       Wash face,  Genitals (private parts) with your normal soap.             6.  Wash thoroughly, paying special attention to the area where your surgery  will be performed.  7.  Thoroughly rinse your body with warm water from the neck down.  8.  DO NOT shower/wash with your normal soap after using and rinsing off  the CHG Soap.                9.  Pat yourself dry with a clean towel.            10.  Wear clean pajamas.            11.  Place clean sheets on your bed the night of your first shower and do not  sleep with pets. Day of Surgery : Do not apply any lotions/deodorants the morning of surgery.  Please wear clean clothes to the hospital/surgery center.  FAILURE TO FOLLOW THESE INSTRUCTIONS MAY RESULT IN THE CANCELLATION OF YOUR SURGERY PATIENT SIGNATURE_________________________________  NURSE SIGNATURE__________________________________  ________________________________________________________________________

## 2014-08-17 NOTE — Pre-Procedure Instructions (Signed)
EKG AND CARDIOLOGY OFFICE NOTES IN EPIC FROM 11-18-13. CHEST CT REPORT IN EPIC FROM 06-16-14.

## 2014-08-22 ENCOUNTER — Encounter (HOSPITAL_COMMUNITY): Payer: Medicare Other | Admitting: Anesthesiology

## 2014-08-22 ENCOUNTER — Encounter (HOSPITAL_COMMUNITY)
Admission: RE | Disposition: A | Discharge status: Home / Self Care | Payer: Self-pay | Source: Ambulatory Visit | Attending: Urology

## 2014-08-22 ENCOUNTER — Observation Stay (HOSPITAL_COMMUNITY)
Admission: RE | Admit: 2014-08-22 | Discharge: 2014-08-23 | Disposition: A | Discharge status: Home / Self Care | Payer: Medicare Other | Source: Ambulatory Visit | Attending: Urology | Admitting: Urology

## 2014-08-22 ENCOUNTER — Ambulatory Visit (HOSPITAL_COMMUNITY): Payer: Medicare Other | Admitting: Anesthesiology

## 2014-08-22 ENCOUNTER — Encounter (HOSPITAL_COMMUNITY): Payer: Self-pay | Admitting: *Deleted

## 2014-08-22 DIAGNOSIS — N401 Enlarged prostate with lower urinary tract symptoms: Secondary | ICD-10-CM | POA: Insufficient documentation

## 2014-08-22 DIAGNOSIS — N3945 Continuous leakage: Secondary | ICD-10-CM | POA: Insufficient documentation

## 2014-08-22 DIAGNOSIS — N1330 Unspecified hydronephrosis: Secondary | ICD-10-CM | POA: Insufficient documentation

## 2014-08-22 DIAGNOSIS — N390 Urinary tract infection, site not specified: Secondary | ICD-10-CM | POA: Insufficient documentation

## 2014-08-22 DIAGNOSIS — N400 Enlarged prostate without lower urinary tract symptoms: Secondary | ICD-10-CM | POA: Diagnosis present

## 2014-08-22 DIAGNOSIS — N529 Male erectile dysfunction, unspecified: Secondary | ICD-10-CM | POA: Insufficient documentation

## 2014-08-22 DIAGNOSIS — N138 Other obstructive and reflux uropathy: Secondary | ICD-10-CM | POA: Diagnosis not present

## 2014-08-22 DIAGNOSIS — E291 Testicular hypofunction: Secondary | ICD-10-CM | POA: Diagnosis not present

## 2014-08-22 DIAGNOSIS — N3020 Other chronic cystitis without hematuria: Secondary | ICD-10-CM | POA: Insufficient documentation

## 2014-08-22 DIAGNOSIS — N201 Calculus of ureter: Principal | ICD-10-CM | POA: Insufficient documentation

## 2014-08-22 DIAGNOSIS — Z87442 Personal history of urinary calculi: Secondary | ICD-10-CM | POA: Diagnosis not present

## 2014-08-22 HISTORY — PX: TRANSURETHRAL RESECTION OF PROSTATE: SHX73

## 2014-08-22 HISTORY — PX: HOLMIUM LASER APPLICATION: SHX5852

## 2014-08-22 SURGERY — TURP (TRANSURETHRAL RESECTION OF PROSTATE)
Anesthesia: General | Laterality: Left

## 2014-08-22 MED ORDER — PROPOFOL 10 MG/ML IV BOLUS
INTRAVENOUS | Status: AC
  Filled 2014-08-22: qty 20

## 2014-08-22 MED ORDER — LIDOCAINE HCL (CARDIAC) 20 MG/ML IV SOLN
INTRAVENOUS | Status: DC | PRN
  Administered 2014-08-22: 80 mg via INTRAVENOUS

## 2014-08-22 MED ORDER — BELLADONNA ALKALOIDS-OPIUM 16.2-60 MG RE SUPP
RECTAL | Status: DC | PRN
  Administered 2014-08-22: 1 via RECTAL

## 2014-08-22 MED ORDER — ACETAMINOPHEN 10 MG/ML IV SOLN
1000.0000 mg | Freq: Once | INTRAVENOUS | Status: AC
  Administered 2014-08-22: 1000 mg via INTRAVENOUS
  Filled 2014-08-22: qty 100

## 2014-08-22 MED ORDER — LIDOCAINE HCL 2 % EX GEL
CUTANEOUS | Status: AC
  Filled 2014-08-22: qty 10

## 2014-08-22 MED ORDER — LACTATED RINGERS IV SOLN
INTRAVENOUS | Status: DC
  Administered 2014-08-22: 1000 mL via INTRAVENOUS

## 2014-08-22 MED ORDER — LIDOCAINE HCL (CARDIAC) 20 MG/ML IV SOLN
INTRAVENOUS | Status: AC
  Filled 2014-08-22: qty 5

## 2014-08-22 MED ORDER — CEFAZOLIN SODIUM-DEXTROSE 2-3 GM-% IV SOLR
INTRAVENOUS | Status: AC
  Filled 2014-08-22: qty 50

## 2014-08-22 MED ORDER — OXYCODONE-ACETAMINOPHEN 5-325 MG PO TABS
1.0000 | ORAL_TABLET | ORAL | Status: DC | PRN
  Administered 2014-08-22 (×2): 1 via ORAL
  Filled 2014-08-22 (×2): qty 1

## 2014-08-22 MED ORDER — MIDAZOLAM HCL 5 MG/5ML IJ SOLN
INTRAMUSCULAR | Status: DC | PRN
  Administered 2014-08-22: 1 mg via INTRAVENOUS

## 2014-08-22 MED ORDER — DIPHENHYDRAMINE HCL 50 MG/ML IJ SOLN
12.5000 mg | Freq: Four times a day (QID) | INTRAMUSCULAR | Status: DC | PRN
Start: 2014-08-22 — End: 2014-08-23

## 2014-08-22 MED ORDER — DIPHENHYDRAMINE HCL 12.5 MG/5ML PO ELIX: 12.5000 mg | ORAL_SOLUTION | Freq: Four times a day (QID) | ORAL | Status: DC | PRN

## 2014-08-22 MED ORDER — SODIUM CHLORIDE 0.9 % IR SOLN
3000.0000 mL | Status: DC
  Administered 2014-08-22: 3000 mL

## 2014-08-22 MED ORDER — DEXAMETHASONE SODIUM PHOSPHATE 10 MG/ML IJ SOLN
INTRAMUSCULAR | Status: DC | PRN
  Administered 2014-08-22: 8 mg via INTRAVENOUS

## 2014-08-22 MED ORDER — MIDAZOLAM HCL 2 MG/2ML IJ SOLN
INTRAMUSCULAR | Status: AC
  Filled 2014-08-22: qty 2

## 2014-08-22 MED ORDER — ONDANSETRON HCL 4 MG/2ML IJ SOLN
INTRAMUSCULAR | Status: AC
  Filled 2014-08-22: qty 2

## 2014-08-22 MED ORDER — SENNOSIDES-DOCUSATE SODIUM 8.6-50 MG PO TABS
2.0000 | ORAL_TABLET | Freq: Every day | ORAL | Status: DC
  Administered 2014-08-22: 2 via ORAL
  Filled 2014-08-22 (×2): qty 2

## 2014-08-22 MED ORDER — SODIUM CHLORIDE 0.9 % IR SOLN
Status: DC | PRN
  Administered 2014-08-22: 13000 mL

## 2014-08-22 MED ORDER — 0.9 % SODIUM CHLORIDE (POUR BTL) OPTIME
TOPICAL | Status: DC | PRN
  Administered 2014-08-22: 1000 mL

## 2014-08-22 MED ORDER — DEXAMETHASONE SODIUM PHOSPHATE 10 MG/ML IJ SOLN
INTRAMUSCULAR | Status: AC
  Filled 2014-08-22: qty 1

## 2014-08-22 MED ORDER — CARVEDILOL 12.5 MG PO TABS
12.5000 mg | ORAL_TABLET | Freq: Two times a day (BID) | ORAL | Status: DC
  Administered 2014-08-22 – 2014-08-23 (×2): 12.5 mg via ORAL
  Filled 2014-08-22 (×4): qty 1

## 2014-08-22 MED ORDER — FENTANYL CITRATE 0.05 MG/ML IJ SOLN
INTRAMUSCULAR | Status: DC | PRN
  Administered 2014-08-22 (×3): 50 ug via INTRAVENOUS
  Administered 2014-08-22 (×2): 25 ug via INTRAVENOUS

## 2014-08-22 MED ORDER — HYOSCYAMINE SULFATE 0.125 MG SL SUBL
0.1250 mg | SUBLINGUAL_TABLET | SUBLINGUAL | Status: DC | PRN
  Filled 2014-08-22: qty 1

## 2014-08-22 MED ORDER — HYDROMORPHONE HCL PF 1 MG/ML IJ SOLN
INTRAMUSCULAR | Status: AC
  Filled 2014-08-22: qty 1

## 2014-08-22 MED ORDER — IOHEXOL 300 MG/ML  SOLN
INTRAMUSCULAR | Status: DC | PRN
  Administered 2014-08-22: 8 mL

## 2014-08-22 MED ORDER — EPHEDRINE SULFATE 50 MG/ML IJ SOLN
INTRAMUSCULAR | Status: DC | PRN
Start: 2014-08-22 — End: 2014-08-22
  Administered 2014-08-22: 5 mg via INTRAVENOUS

## 2014-08-22 MED ORDER — BELLADONNA ALKALOIDS-OPIUM 16.2-60 MG RE SUPP
RECTAL | Status: AC
  Filled 2014-08-22: qty 1

## 2014-08-22 MED ORDER — PROMETHAZINE HCL 25 MG/ML IJ SOLN: 6.2500 mg | INTRAMUSCULAR | Status: DC | PRN

## 2014-08-22 MED ORDER — DEXTROSE-NACL 5-0.45 % IV SOLN
INTRAVENOUS | Status: DC
  Administered 2014-08-22: 17:00:00 via INTRAVENOUS

## 2014-08-22 MED ORDER — SODIUM CHLORIDE 0.9 % IV SOLN
10.0000 mg | INTRAVENOUS | Status: DC | PRN
  Administered 2014-08-22: 40 ug/min via INTRAVENOUS

## 2014-08-22 MED ORDER — HYDROMORPHONE HCL PF 1 MG/ML IJ SOLN: 0.5000 mg | INTRAMUSCULAR | Status: DC | PRN

## 2014-08-22 MED ORDER — CIPROFLOXACIN HCL 500 MG PO TABS
500.0000 mg | ORAL_TABLET | Freq: Two times a day (BID) | ORAL | Status: DC
  Administered 2014-08-22 – 2014-08-23 (×2): 500 mg via ORAL
  Filled 2014-08-22 (×4): qty 1

## 2014-08-22 MED ORDER — PHENYLEPHRINE HCL 10 MG/ML IJ SOLN
INTRAMUSCULAR | Status: AC
  Filled 2014-08-22: qty 1

## 2014-08-22 MED ORDER — KETOROLAC TROMETHAMINE 30 MG/ML IJ SOLN
INTRAMUSCULAR | Status: AC
  Filled 2014-08-22: qty 1

## 2014-08-22 MED ORDER — CHOLESTYRAMINE 4 G PO PACK
4.0000 g | PACK | Freq: Two times a day (BID) | ORAL | Status: DC
  Filled 2014-08-22 (×3): qty 1

## 2014-08-22 MED ORDER — PROPOFOL 10 MG/ML IV BOLUS
INTRAVENOUS | Status: DC | PRN
  Administered 2014-08-22: 100 mg via INTRAVENOUS

## 2014-08-22 MED ORDER — ONDANSETRON HCL 4 MG/2ML IJ SOLN
INTRAMUSCULAR | Status: DC | PRN
  Administered 2014-08-22: 4 mg via INTRAVENOUS

## 2014-08-22 MED ORDER — EZETIMIBE 10 MG PO TABS
10.0000 mg | ORAL_TABLET | Freq: Every morning | ORAL | Status: DC
  Administered 2014-08-22: 10 mg via ORAL
  Filled 2014-08-22 (×2): qty 1

## 2014-08-22 MED ORDER — KETOROLAC TROMETHAMINE 15 MG/ML IJ SOLN
INTRAMUSCULAR | Status: DC | PRN
  Administered 2014-08-22: 15 mg via INTRAVENOUS

## 2014-08-22 MED ORDER — CEFAZOLIN SODIUM-DEXTROSE 2-3 GM-% IV SOLR
2.0000 g | INTRAVENOUS | Status: AC
  Administered 2014-08-22: 2 g via INTRAVENOUS

## 2014-08-22 MED ORDER — FENTANYL CITRATE 0.05 MG/ML IJ SOLN
INTRAMUSCULAR | Status: AC
  Filled 2014-08-22: qty 5

## 2014-08-22 MED ORDER — ONDANSETRON HCL 4 MG/2ML IJ SOLN: 4.0000 mg | INTRAMUSCULAR | Status: DC | PRN

## 2014-08-22 MED ORDER — HYDROMORPHONE HCL PF 1 MG/ML IJ SOLN
0.2500 mg | INTRAMUSCULAR | Status: DC | PRN
  Administered 2014-08-22 (×2): 0.5 mg via INTRAVENOUS

## 2014-08-22 MED ORDER — ISOSORBIDE MONONITRATE ER 30 MG PO TB24
30.0000 mg | ORAL_TABLET | Freq: Every morning | ORAL | Status: DC
  Filled 2014-08-22: qty 1

## 2014-08-22 SURGICAL SUPPLY — 32 items
BAG URINE DRAINAGE (UROLOGICAL SUPPLIES) IMPLANT
BAG URO CATCHER STRL LF (DRAPE) ×3 IMPLANT
BASKET STONE NCOMPASS (UROLOGICAL SUPPLIES) ×3 IMPLANT
CATH AINSWORTH 30CC 24FR (CATHETERS) IMPLANT
CATH CLEAR GEL 3F BACKSTOP (CATHETERS) ×3 IMPLANT
CATH FOLEY 3WAY 30CC 24FR (CATHETERS)
CATH HEMA 3WAY 30CC 22FR COUDE (CATHETERS) ×3 IMPLANT
CATH INTERMIT  6FR 70CM (CATHETERS) ×3 IMPLANT
CATH URO 16X24FR 3W FL PS (CATHETERS) IMPLANT
CLOTH BEACON ORANGE TIMEOUT ST (SAFETY) ×3 IMPLANT
DRAPE CAMERA CLOSED 9X96 (DRAPES) ×3 IMPLANT
ELECT BUTTON HF 24-28F 2 30DE (ELECTRODE) ×3 IMPLANT
ELECT LOOP MED HF 24F 12D (CUTTING LOOP) ×3 IMPLANT
ELECT LOOP MED HF 24F 12D CBL (CLIP) ×6 IMPLANT
ELECT RESECT VAPORIZE 12D CBL (ELECTRODE) IMPLANT
EXTRACTOR STONE NITINOL NGAGE (UROLOGICAL SUPPLIES) ×6 IMPLANT
FIBER LASER FLEXIVA 200 (UROLOGICAL SUPPLIES) ×3 IMPLANT
GLOVE BIOGEL M STRL SZ7.5 (GLOVE) ×3 IMPLANT
GOWN STRL REUS W/TWL LRG LVL3 (GOWN DISPOSABLE) ×3 IMPLANT
GOWN STRL REUS W/TWL XL LVL3 (GOWN DISPOSABLE) ×3 IMPLANT
GUIDEWIRE ANG ZIPWIRE 038X150 (WIRE) ×3 IMPLANT
GUIDEWIRE STR DUAL SENSOR (WIRE) ×3 IMPLANT
HOLDER FOLEY CATH W/STRAP (MISCELLANEOUS) IMPLANT
KIT ASPIRATION TUBING (SET/KITS/TRAYS/PACK) ×3 IMPLANT
MANIFOLD NEPTUNE II (INSTRUMENTS) ×3 IMPLANT
NS IRRIG 1000ML POUR BTL (IV SOLUTION) ×3 IMPLANT
PACK CYSTO (CUSTOM PROCEDURE TRAY) ×3 IMPLANT
STENT URET 6FRX24 CONTOUR (STENTS) ×3 IMPLANT
SYR 30ML LL (SYRINGE) IMPLANT
SYRINGE IRR TOOMEY STRL 70CC (SYRINGE) ×3 IMPLANT
TUBING CONNECTING 10 (TUBING) ×2 IMPLANT
TUBING CONNECTING 10' (TUBING) ×1

## 2014-08-22 NOTE — Transfer of Care (Signed)
Immediate Anesthesia Transfer of Care Note  Patient: [name] [name]  Procedure(s) Performed: Procedure(s): TRANSURETHRAL RESECTION OF THE PROSTATE (TURP) CYSTOSCOPY, LEFT RETROGRADE PYLEGRAM,LEFT URETEROSCOPY,PLACEMENT OF BACKSTOP,LASER FRAGMENTATION WITH BASKET EXTRACTION LEFT URETEROSCOPY (Left) HOLMIUM LASER APPLICATION (Left)  Patient Location: PACU  Anesthesia Type:General  Level of Consciousness: awake, oriented, patient cooperative, lethargic and responds to stimulation  Airway & Oxygen Therapy: Patient Spontanous Breathing and Patient connected to face mask oxygen  Post-op Assessment: Report given to PACU RN, Post -op Vital signs reviewed and stable and Patient moving all extremities  Post vital signs: Reviewed and stable  Complications: No apparent anesthesia complications

## 2014-08-22 NOTE — Op Note (Signed)
Pre-operative diagnosis : 1 impacted left mid ureteral calculus  2. Symptomatic BPH with left lateral lobe enlargement  Postoperative diagnosis:  Same.  Operation:   1 cystourethroscopy, left retrograde pyelogram with interpretation, left ureteroscopy, placement of backstop, laser fragmentation of left impacted mid-ureteral calculus, basket extraction of stone fragments, left double-J stent (6 [place] by 24 cm); TURP (left lateral lobe).    Surgeon:  [name] [name]. [name] Arabian, MD  First assistant:  [name] [name] M.D.  Anesthesia:  General LMA  Preparation:  After appropriate preanesthesia, the patient was brought to the operating room, placed on the operating table in the dorsal supine position where general LMA anesthesia was introduced. He was replaced in the dorsal lithotomy position where the pubis was prepped with Betadine solution and draped in usual fashion. The arm band was double checked. The history was double checked.  Review history:  78 yo male returns today for a cystoscopy, review CT results & discuss HRT. He recently had a CT scan to f/u on hx of lung cancer, that showed he has some kidney stones. He is s/p Green light procedure on 02/08/13. He has done PT in the past for hx of incontinence. He only had a slight improvement. He has > 95% of control. Wearing diaper with occasional urine leak.  Pt has multiple medical and urologic problems: 1. LLP stones, and L hydronephrosis, asymptomatic, found incidentally on Chest CT. Remains asymptomatic.  2. Recurrent UTI. Treated by Dr. Laurann [name] recently ( ? culture) successfully with cipro.  3. Urinary incontinence: post op [name] [name] Laser January 2014. Pt has almost completely resolved his stress incontinence, but still wears a diaper when going out in the evening.  4. Hypogonadism: T 135. found by Dr. Laurann [name], but not treated. Pt feels stronger, ans has reasonable energy. Complains of impotence  5. Impotence: He has taken samples of ED meds without  success. He would probably need injection therapy.  Hx of elevated PSA, BPH, hypogonadism, ED, & kidney stones. Patient stopped both Finasteride and Tamsulosin. He has a hx of poor voiding pattern previously on Rapaflo, with previous IPSS = 21. He stopped Axiron w/o problems, with f/u T of 571 on 06/11/11. T - 275 now. PUS=58.97cc. IPSS=19.      Statement of  Likelihood of Success: Excellent. TIME-OUT observed.:  Procedure:  Cystourethroscopy was accomplished, which showed left lateral lobe prostatic hypertrophy. The bladder was noted to have trabeculation and early cellule formation. There was no evidence of diverticular disease. There was no bladder stone or tumor noted. Clear reflux was noted from the right orifice. The left ureteral orifice was cannulated, and retrograde pyelogram showed a tortuous left ureter, with multiple areas of apparent stricture disease. The guidewire would not pass up the ureter entirely. Stone was poorly visualized, but under magnification x3, and with retrograde collar fit, stone was identified in the mid ureter. The 6.5 French ureteroscope was passed into the left ureter, and into the mid ureter. The stone was identified, and wire was passed around the stone. The scope was then removed, and the scope was replaced. Backstop was then placed above the level of the stone. Using the standard 200  laser fiber, the patient underwent laser fragmentation the stone with 0.5 J, 5 shocks per second. The stone was trapped in the basket, but could not be removed. Therefore, this stone basket was cut, and repeat ureteroscopy was accomplished, and fragments were removed with the in gauge basket. Repeat laser fragmentation of the stone within the basket was accomplished  and the basket was removed. Repeat ureteroscopy revealed no evidence of basket within the ureter. The remaining stone in the ureter was then fragmented using the laser, and the stone fragments were removed. A 6 French by 24  semiurgent double-J stent was then placed under fluoroscopic control, coiling in the renal pelvis and in the bladder.  Attention was then directed to the prostate, which showed enlargement of the left lateral lobe across the midline. This required resection, and this was accomplished from the 5:00 position, back to the 1:00 position. Chips were sent to laboratory for evaluation. Extensive cauterization was accomplished. After the chips were evacuated, a size 22 [place] three-way hematuria catheter was passed traction and continuous irrigation. The patient was awakened and taken to recovery room in good condition. He received IV Tylenol, and a reduced dose of IV Toradol.

## 2014-08-22 NOTE — Anesthesia Preprocedure Evaluation (Addendum)
Anesthesia Evaluation  Patient identified by MRN, date of birth, ID band Patient awake    Reviewed: Allergy & Precautions, H&P , NPO status , Patient's Chart, lab work & pertinent test results  History of Anesthesia Complications (+) AWARENESS UNDER ANESTHESIA  Airway Mallampati: II TM Distance: >3 FB Neck ROM: Full    Dental no notable dental hx.    Pulmonary former smoker,  breath sounds clear to auscultation  Pulmonary exam normal       Cardiovascular hypertension, Pt. on medications and Pt. on home beta blockers + CAD and + Peripheral Vascular Disease + Valvular Problems/Murmurs Rhythm:Regular Rate:Normal  EF 30-35% on ECHO   Neuro/Psych negative neurological ROS  negative psych ROS   GI/Hepatic negative GI ROS, Neg liver ROS,   Endo/Other  negative endocrine ROS  Renal/GU Renal disease  negative genitourinary   Musculoskeletal negative musculoskeletal ROS (+)   Abdominal   Peds negative pediatric ROS (+)  Hematology negative hematology ROS (+)   Anesthesia Other Findings   Reproductive/Obstetrics negative OB ROS                          Anesthesia Physical Anesthesia Plan  ASA: III  Anesthesia Plan: General   Post-op Pain Management:    Induction: Intravenous  Airway Management Planned: LMA  Additional Equipment:   Intra-op Plan:   Post-operative Plan: Extubation in OR  Informed Consent: I have reviewed the patients History and Physical, chart, labs and discussed the procedure including the risks, benefits and alternatives for the proposed anesthesia with the patient or authorized representative who has indicated his/her understanding and acceptance.   Dental advisory given  Plan Discussed with: CRNA  Anesthesia Plan Comments:         Anesthesia Quick Evaluation

## 2014-08-22 NOTE — Interval H&P Note (Signed)
History and Physical Interval Note:  08/22/2014 11:08 AM  [name] [name] Guardian  has presented today for surgery, with the diagnosis of BPH URETERAL LEFT STONE  The various methods of treatment have been discussed with the patient and family. After consideration of risks, benefits and other options for treatment, the patient has consented to  Procedure(s): TRANSURETHRAL RESECTION OF THE PROSTATE (TURP) CYSTO  LEFT URETEROSCOPY (Left) HOLMIUM LASER APPLICATION (Left) as a surgical intervention .  The patient's history has been reviewed, patient examined, no change in status, stable for surgery.  I have reviewed the patient's chart and labs.  Questions were answered to the patient's satisfaction.     Carolan Clines I

## 2014-08-22 NOTE — H&P (Signed)
Reason For Visit Cystoscopy, review CT & discuss HRT   Active Problems Problems  1. Benign prostatic hyperplasia with urinary obstruction (600.01,599.69)   Assessed By: Carolan Clines (Urology); Last Assessed: 27 Jul 2014 2. Calculus of ureter (592.1)   Assessed By: Carolan Clines (Urology); Last Assessed: 27 Jul 2014 3. Chronic cystitis (595.2)   Assessed By: Carolan Clines (Urology); Last Assessed: 28 Jun 2014 4. Elevated prostate specific antigen (PSA) (790.93)   Assessed By: Carolan Clines (Urology); Last Assessed: 05 Oct 2013 5. Erectile dysfunction due to arterial insufficiency (607.84)   Assessed By: Carolan Clines (Urology); Last Assessed: 28 Jun 2014 6. Hydronephrosis (591)   Assessed By: Carolan Clines (Urology); Last Assessed: 27 Jul 2014 7. Hypogonadism, testicular (257.2)   Assessed By: Carolan Clines (Urology); Last Assessed: 28 Jun 2014 8. Nephrolithiasis (592.0)   Assessed By: Carolan Clines (Urology); Last Assessed: 04 Dec 2011 9. Urinary incontinence with continuous leakage (788.37)   Assessed By: Carolan Clines (Urology); Last Assessed: 27 Jul 2014  History of Present Illness    78 yo male returns today for a cystoscopy, review CT results & discuss HRT. He recently had a CT scan to f/u on hx of lung cancer, that showed he has some kidney stones. He is s/p Green light procedure on 02/08/13. He has done PT in the past for hx of incontinence. He only had a slight improvement. He has > 95% of control. Wearing diaper with occasional urine leak.   Pt has multiple medical and urologic problems: 1. LLP stones, and L hydronephrosis, asymptomatic, found incidentally on Chest CT. Remains asymptomatic.  2. Recurrent UTI. Treated by Dr. Laurann Montana recently ( ? culture) successfully with cipro.   3. Urinary incontinence: post op [facility] January 2014. Pt has almost completely resolved his stress incontinence, but still  wears a diaper when going out in the evening.   4. Hypogonadism: T 135. found by Dr. Laurann Montana, but not treated. Pt feels stronger, ans has reasonable energy. Complains of impotence  5. Impotence: He has taken samples of ED meds without success. He would probably need injection therapy.    Hx of elevated PSA, BPH, hypogonadism, ED, & kidney stones. Patient stopped both Finasteride and Tamsulosin. He has a hx of poor voiding pattern previously on Rapaflo, with previous IPSS = 21. He stopped Axiron w/o problems, with f/u T of 571 on 06/11/11. T - 275 now. PUS=58.97cc. IPSS=19.     PSA 4.54 September 2008, with PSA-2 of 12%. 12/08/08 PSA 5.6/13.6% and 12/21/09 PSA 6.58 (by Dr. Laurann Montana). He had a saturation prostate biopsy on 11/28/10 which was negative. He has a hx of kidney stones and epididymal cysts, but not currently being bothered.     He felt that the Uroxatral was too "pricey", and did not try. Previously failed Tamsulosin. Hx of decreased libido, but without fatigue and muscle weakness.     ED x 3-4 years, failing sample of ED med. He is getting partial erection and cannot penetrate. He is able to ejaculate, but has low volume (pus shows no blockage).    08/25/13 Testosterone - 275    12/01/12 PSA - 1.56  11/27/11 PSA - 2.65  06/11/11 Testosterone - 571.21  08/21/10 PSA - 7.21/10.1%   11/28/10 Testosterone - 225.50.   Past Medical History Problems  1. History of Cancer (199.1) 2. History of hypercholesterolemia (V12.29) 3. History of hypertension (V12.59) 4. History of Murmur (785.2)  Surgical History Problems  1. History of Chemosurgery (Mohs Micrographic Technique)  2. History of Destruction Of Malignant Lesion 3. History of Laser Vaporization With Transurethral Resection Of Prostate 4. History of Lung Surgery  Current Meds 1. Aspirin 81 MG Oral Tablet;  Therapy: (Recorded:30Jun2009) to Recorded 2. Carvedilol 12.5 MG Oral Tablet;  Therapy: (Recorded:11Dec2013)  to Recorded 3. Centrum Silver Oral Tablet;  Therapy: (Recorded:11Dec2013) to Recorded 4. Cholestyramine 4 GM Oral Packet;  Therapy: (Recorded:07Oct2014) to Recorded 5. Ibuprofen 800 MG Oral Tablet;  Therapy: (Recorded:29Jul2015) to Recorded 6. Isosorbide Mononitrate ER 30 MG Oral Tablet Extended Release 24 Hour;  Therapy: (Recorded:11Dec2013) to Recorded 7. Metamucil CAPS;  Therapy: (Recorded:07Oct2014) to Recorded 8. Super B Complex TABS;  Therapy: (Recorded:27Aug2014) to Recorded 9. Triple Omega-3-6-9 CAPS;  Therapy: (Recorded:11Dec2013) to Recorded 10. Zetia 10 MG Oral Tablet;   Therapy: (Recorded:30Jun2015) to Recorded  Allergies Medication  1. Statins  Family History Problems  1. Family history of Diabetes Mellitus (V18.0) : Mother 2. Family history of Family Health Status Number Of Children   2 sons 3. Family history of Father Deceased At Age ____   51, lung cancer 4. Family history of Mother Deceased At Age ____   64, heart disease 5. Family history of Nephrolithiasis : Father 75. Family history of Nephrolithiasis : Son  Social History Problems  1. Activities Of Daily Living 2. Alcohol Use   0-2 PER DAY 3. Caffeine Use   4 PER DAY 4. Family history of Death In The Family Father   2 LUNG CANCER 5. Exercise Habits   Is trying to become more active with exercise participating with silver sneakers 2 or 3     mornings a week.  He indicates that he had been in consistent with this exercise     program primarily due to weather changes. 6. Former smoker (V15.82) 7. Living Independently With Spouse 8. Self-reliant In Usual Daily Activities 9. Tobacco Use (V15.82)   1 PPD FOR 12 YEARS, QUIT 40 YEARS AGO, NO OTHER FORMS OF TOBACCO  Review of Systems Genitourinary, constitutional, skin, eye, otolaryngeal, hematologic/lymphatic, cardiovascular, pulmonary, endocrine, musculoskeletal, gastrointestinal, neurological and psychiatric system(s) were reviewed and  pertinent findings if present are noted.  Genitourinary: urinary frequency, feelings of urinary urgency, nocturia, weak urinary stream, urinary stream starts and stops, incomplete emptying of bladder and initiating urination requires straining.    Vitals Vital Signs [Data Includes: Last 1 Day]  Recorded: 29Jul2015 02:50PM  Blood Pressure: 141 / 83 Temperature: 97.6 F Heart Rate: 70  Physical Exam Constitutional: Well nourished and well developed . No acute distress.  ENT:. The ears and nose are normal in appearance.  Neck: The appearance of the neck is normal and no neck mass is present.  Pulmonary: No respiratory distress and normal respiratory rhythm and effort.  Cardiovascular: Heart rate and rhythm are normal . No peripheral edema.  Abdomen: The abdomen is soft and nontender. No masses are palpated. No CVA tenderness. No hernias are palpable. No hepatosplenomegaly noted.  Rectal: Rectal exam demonstrates normal sphincter tone, no tenderness and no masses. Estimated prostate size is 4+. The prostate has no nodularity and is not tender. The left seminal vesicle is nonpalpable. The right seminal vesicle is nonpalpable. The perineum is normal on inspection.  Genitourinary: Examination of the penis demonstrates no discharge, no masses, no lesions and a normal meatus. The scrotum is without lesions. The right epididymis is palpably normal and non-tender. The left epididymis is palpably normal and non-tender. The right testis is non-tender and without masses. The left testis is non-tender and without masses.  Lymphatics: The femoral and inguinal nodes are not enlarged or tender.  Skin: Normal skin turgor, no visible rash and no visible skin lesions.  Neuro/Psych:. Mood and affect are appropriate.    Results/Data Selected Results  TESTOSTERONE 30Jun2015 12:44PM Carolan Clines  SPECIMEN TYPE: BLOOD   Test Name Result Flag Reference  TESTOSTERONE, TOTAL 249 ng/dL L 300-890  TANNER STAGE        MALE              MALE               I              < 30 NG/DL        < 10 NG/DL               II             < 150 NG/DL       < 30 NG/DL               III            100-320 NG/DL     < 35 NG/DL               IV             200-970 NG/DL     15-40 NG/DL               V/ADULT        300-890 NG/DL     10-70 NG/DL   Procedure  Procedure: Cystoscopy  Chaperone Present: kim lewis.  Indication: Lower Urinary Tract Symptoms. Frequent UTI.  Informed Consent: Risks, benefits, and potential adverse events were discussed and informed consent was obtained from the patient.  Prep: The patient was prepped with betadine.  Anesthesia:. Local anesthesia was administered intraurethrally with 2% lidocaine jelly.  Antibiotic prophylaxis: Ciprofloxacin.  Procedure Note:  Urethral meatus:. A pinpoint and moderate stricture was present at the urethral meatus and was dilated.  Anterior urethra: No abnormalities.  Prostatic urethra:. There was visual obstruction of the prostatic urethra. An enlarged intravesical median lobe was visualized.  Bladder: Visulization was clear. Examination of the bladder demonstrated trabeculation.  Complications: None.    Assessment Assessed  1. Benign prostatic hyperplasia with urinary obstruction (600.01,599.69) 2. Hydronephrosis (591) 3. Urinary incontinence with continuous leakage (788.37) 4. Calculus of ureter (592.1)  78 yo male with L mid-ureteral stone wiuth hydronephrosis, and continued obstructing middle lobe of the prostate. He needs stone extracted, and TURP. Hypogonadal, with T=249. he will need TRT, and will begin Androgel Trial, with f/u T in 6 months.   Plan Benign prostatic hyperplasia with urinary obstruction  1. Follow-up Month x 6 Office  Follow-up  Status: Hold For - Exact Date,Date of Service   Requested for: Jan 2016 2. TESTOSTERONE; Status:Hold For - Specimen/Data Collection,Exact Date; Requested  for:Jan 2016;  Hypogonadism, testicular  3.  Start: AndroGel Pump 20.25 MG/ACT (1.62%) Transdermal Gel; Apply 4 pumps daily to  upper arms/chest area  1. plan: TURP-middle lobe.  2. plan : L mid-ureteral stone for extraction   Discussion/Summary cc: Dr. Julieanne Manson   cc: Dr. Lavone Orn.   cc: Fransico Him   cc: Dr. Alda Ponder Electronically signed by : Carolan Clines, M.D.; Jul 27 2014  6:18PM EST

## 2014-08-22 NOTE — Anesthesia Postprocedure Evaluation (Signed)
  Anesthesia Post-op Note  Patient: [name] [name]  Procedure(s) Performed: Procedure(s) (LRB): TRANSURETHRAL RESECTION OF THE PROSTATE (TURP) CYSTOSCOPY, LEFT RETROGRADE PYLEGRAM,LEFT URETEROSCOPY,PLACEMENT OF BACKSTOP,LASER FRAGMENTATION WITH BASKET EXTRACTION LEFT URETEROSCOPY (Left) HOLMIUM LASER APPLICATION (Left)  Patient Location: PACU  Anesthesia Type: General  Level of Consciousness: awake and alert   Airway and Oxygen Therapy: Patient Spontanous Breathing  Post-op Pain: mild  Post-op Assessment: Post-op Vital signs reviewed, Patient's Cardiovascular Status Stable, Respiratory Function Stable, Patent Airway and No signs of Nausea or vomiting  Last Vitals:  Filed Vitals:   08/22/14 1505  BP: 129/60  Pulse: 62  Temp: 36.8 C  Resp: 14    Post-op Vital Signs: stable   Complications: No apparent anesthesia complications

## 2014-08-23 ENCOUNTER — Encounter (HOSPITAL_COMMUNITY): Payer: Self-pay | Admitting: Urology

## 2014-08-23 DIAGNOSIS — N201 Calculus of ureter: Secondary | ICD-10-CM | POA: Diagnosis not present

## 2014-08-23 MED ORDER — TRAMADOL-ACETAMINOPHEN 37.5-325 MG PO TABS: 1.0000 | ORAL_TABLET | Freq: Four times a day (QID) | ORAL | Status: DC | PRN

## 2014-08-23 MED ORDER — TRIMETHOPRIM 100 MG PO TABS: 100.0000 mg | ORAL_TABLET | ORAL | Status: DC

## 2014-08-23 MED ORDER — PHENAZOPYRIDINE HCL 200 MG PO TABS: 200.0000 mg | ORAL_TABLET | Freq: Three times a day (TID) | ORAL | Status: DC | PRN

## 2014-08-23 NOTE — Care Management Note (Signed)
    Page 1 of 1   08/23/2014     11:08:45 AM CARE MANAGEMENT NOTE 08/23/2014  Patient:  [name] [name], [name] [name]   Account Number:  [id number]  Date Initiated:  08/23/2014  Documentation initiated by:  Dessa Phi  Subjective/Objective Assessment:   78 Y/O M ADMITTED W/BPH.     Action/Plan:   FROM HOME.   Anticipated DC Date:  08/23/2014   Anticipated DC Plan:  [place]  CM consult      Choice offered to / List presented to:             Status of service:  In process, will continue to follow Medicare Important Message given?   (If response is "NO", the following Medicare IM given date fields will be blank) Date Medicare IM given:   Medicare IM given by:   Date Additional Medicare IM given:   Additional Medicare IM given by:    Discharge Disposition:    Per UR Regulation:  Reviewed for med. necessity/level of care/duration of stay  If discussed at [place] of Stay Meetings, dates discussed:    Comments:  08/23/14 Raeghan Demeter RN,BSN NCM [phone] NO ANTICIPATED D/C NEEDS.

## 2014-08-23 NOTE — Discharge Instructions (Signed)
Benign Prostatic Hyperplasia An enlarged prostate (benign prostatic hyperplasia) is common in older men. You may experience the following:  Weak urine stream.  Dribbling.  Feeling like the bladder has not emptied completely.  Difficulty starting urination.  Getting up frequently at night to urinate.  Urinating more frequently during the day. HOME CARE INSTRUCTIONS  Monitor your prostatic hyperplasia for any changes. The following actions may help to alleviate any discomfort you are experiencing:  Give yourself time when you urinate.  Stay away from alcohol.  Avoid beverages containing caffeine, such as coffee, tea, and colas, because they can make the problem worse.  Avoid decongestants, antihistamines, and some prescription medicines that can make the problem worse.  Follow up with your health care provider for further treatment as recommended. SEEK MEDICAL CARE IF:  You are experiencing progressive difficulty voiding.  Your urine stream is progressively getting narrower.  You are awaking from sleep with the urge to void more frequently.  You are constantly feeling the need to void.  You experience loss of urine, especially in small amounts. SEEK IMMEDIATE MEDICAL CARE IF:   You develop increased pain with urination or are unable to urinate.  You develop severe abdominal pain, vomiting, a high fever, or fainting.  You develop back pain or blood in your urine. MAKE SURE YOU:   Understand these instructions.  Will watch your condition.  Will get help right away if you are not doing well or get worse. Document Released: 12/16/2005 Document Revised: 08/18/2013 Document Reviewed: 05/18/2013 [facility] Patient Information 2015 [place], [place]. This information is not intended to replace advice given to you by your health care provider. Make sure you discuss any questions you have with your health care provider.  Dietary Guidelines to Help Prevent Kidney Stones Your  risk of kidney stones can be decreased by adjusting the foods you eat. The most important thing you can do is drink enough fluid. You should drink enough fluid to keep your urine clear or pale yellow. The following guidelines provide specific information for the type of kidney stone you have had. GUIDELINES ACCORDING TO TYPE OF KIDNEY STONE Calcium Oxalate Kidney Stones  Reduce the amount of salt you eat. Foods that have a lot of salt cause your body to release excess calcium into your urine. The excess calcium can combine with a substance called oxalate to form kidney stones.  Reduce the amount of animal protein you eat if the amount you eat is excessive. Animal protein causes your body to release excess calcium into your urine. Ask your dietitian how much protein from animal sources you should be eating.  Avoid foods that are high in oxalates. If you take vitamins, they should have less than 500 mg of vitamin C. Your body turns vitamin C into oxalates. You do not need to avoid fruits and vegetables high in vitamin C. Calcium Phosphate Kidney Stones  Reduce the amount of salt you eat to help prevent the release of excess calcium into your urine.  Reduce the amount of animal protein you eat if the amount you eat is excessive. Animal protein causes your body to release excess calcium into your urine. Ask your dietitian how much protein from animal sources you should be eating.  Get enough calcium from food or take a calcium supplement (ask your dietitian for recommendations). Food sources of calcium that do not increase your risk of kidney stones include:  Broccoli.  Dairy products, such as cheese and yogurt.  Pudding. Uric Acid Kidney  Stones  Do not have more than 6 oz of animal protein per day. FOOD SOURCES Animal Protein Sources  Meat (all types).  Poultry.  Eggs.  Fish, seafood. Foods High in [employer] seasonings.  Soy sauce.  Teriyaki sauce.  Cured and processed  meats.  Salted crackers and snack foods.  Fast food.  Canned soups and most canned foods. Foods High in Oxalates  Grains:  Amaranth.  Barley.  Grits.  Wheat germ.  Bran.  Buckwheat flour.  All bran cereals.  Pretzels.  Whole wheat bread.  Vegetables:  Beans (wax).  Beets and beet greens.  Collard greens.  Eggplant.  Escarole.  Leeks.  Okra.  Parsley.  Rutabagas.  Spinach.  Swiss chard.  Tomato paste.  Fried potatoes.  Sweet potatoes.  Fruits:  Red currants.  Figs.  Kiwi.  Rhubarb.  Meat and Other Protein Sources:  Beans (dried).  Soy burgers and other soybean products.  Miso.  Nuts (peanuts, almonds, pecans, cashews, hazelnuts).  Nut butters.  Sesame seeds and tahini (paste made of sesame seeds).  Poppy seeds.  Beverages:  Chocolate drink mixes.  Soy milk.  Instant iced tea.  Juices made from high-oxalate fruits or vegetables.  Other:  Carob.  Chocolate.  Fruitcake.  Marmalades. Document Released: 04/12/2011 Document Revised: 12/21/2013 Document Reviewed: 11/12/2013 [facility] Patient Information 2015 [place], [place]. This information is not intended to replace advice given to you by your health care provider. Make sure you discuss any questions you have with your health care provider.

## 2014-08-23 NOTE — Discharge Summary (Signed)
Physician Discharge Summary  Patient ID: [name] [name] MRN: [id number] DOB/AGE: 1936/09/04 78 y.o.  Admit date: 08/22/2014 Discharge date: 08/23/2014  Admission Diagnoses: BPH URETERAL LEFT STONE  Discharge Diagnoses:  Active Problems:   Benign hypertrophy of prostate  2. Impacted 1cm L mid-ureteral stone Discharged Condition:  improved  Hospital Course:   1. Cysto, Left retrograde pyelogram, ureteroscopy, laser fragmentation of stone, basket extraction of stone, L JJ stent;                                  2. TURP, L lateral labe  Consults: none  Significant Diagnostic Studies: No results found.  Treatments: IV hydration  Discharge Exam: Blood pressure 140/56, pulse 68, temperature 97.9 F (36.6 C), temperature source Oral, resp. rate 18, height 5\' 5"  (1.651 m), weight 73.029 kg (161 lb), SpO2 100.00%. General appearance: alert and cooperative Head: atraumatic  Disposition: 01-Home or Self Care  Discharge Instructions   Continue foley catheter    Complete by:  As directed      Discharge patient    Complete by:  As directed      Discontinue IV    Complete by:  As directed             Medication List         aspirin 81 MG tablet  Take 81 mg by mouth daily.     b complex vitamins tablet  Take 1 tablet by mouth daily.     carvedilol 12.5 MG tablet  Commonly known as:  COREG  Take 12.5 mg by mouth 2 (two) times daily with a meal.     cholestyramine 4 G packet  Commonly known as:  QUESTRAN  Take 4 g by mouth 2 (two) times daily.     ezetimibe 10 MG tablet  Commonly known as:  ZETIA  Take 10 mg by mouth every morning.     isosorbide mononitrate 30 MG 24 hr tablet  Commonly known as:  IMDUR  Take 30 mg by mouth every morning.     multivitamin with minerals Tabs tablet  Take 1 tablet by mouth daily.     Omega 3 1200 MG Caps  Take 1 capsule by mouth daily.     phenazopyridine 200 MG tablet  Commonly known as:  PYRIDIUM  Take 1 tablet (200 mg total)  by mouth 3 (three) times daily as needed for pain.     traMADol-acetaminophen 37.5-325 MG per tablet  Commonly known as:  ULTRACET  Take 1 tablet by mouth every 6 (six) hours as needed.     trimethoprim 100 MG tablet  Commonly known as:  TRIMPEX  Take 1 tablet (100 mg total) by mouth 1 day or 1 dose.           Follow-up Information   Follow up with Asael Pann I, MD. Call in 1 day. (for foley removal, and RTC 1 1-2 weeks for JJ removal.)    Specialty:  Urology   Contact information:   [place] [address] [phone]      RTC in AM for foley removal RTC next week for JJ removal Signed: Delsie Amador I 08/23/2014, 8:42 AM

## 2014-08-23 NOTE — Progress Notes (Signed)
UR completed 

## 2014-08-23 NOTE — Progress Notes (Signed)
Urology Progress Note  1 Day Post-Op. Urine clear pink ( no clots). No spasm.                                          1 day post L ureteral stone laser fragmentation and JJ stent; no flank pain  Subjective:  POD #1:   TRANSURETHRAL RESECTION OF THE PROSTATE (TURP) CYSTOSCOPY, LEFT RETROGRADE PYLEGRAM,LEFT URETEROSCOPY,PLACEMENT OF BACKSTOP,LASER FRAGMENTATION WITH BASKET EXTRACTION LEFT URETEROSCOPY (Left)  HOLMIUM LASER APPLICATION (Left)         No acute urologic events overnight. Ambulation:   positive Flatus:    positive Bowel movement  negative  Pain: complete resolution  Objective:  Blood pressure 140/56, pulse 68, temperature 97.9 F (36.6 C), temperature source Oral, resp. rate 18, height 5\' 5"  (1.651 m), weight 73.029 kg (161 lb), SpO2 100.00%.  Physical Exam:  General:  No acute distress, awake Resp: clear to auscultation bilaterally Genitourinary:  Normal BUS Foley: urine pink    I/O last 3 completed shifts: In: 4363.3 [P.O.:120; I.V.:1443.3; Other:2800] Out: 4193 [Urine:4525]  No results found for this basename: HGB, WBC, PLT,  in the last 72 hours  No results found for this basename: NA, K, CL, CO2, BUN, CREATININE, CALCIUM, MAGNESIUM, GFRNONAA, GFRAA,  in the last 72 hours   No results found for this basename: PT, INR, APTT,  in the last 72 hours   No components found with this basename: ABG,   Assessment/Plan:  Follow up in day for catheter removal  F/u ij 1 week for cysto JJ removal.

## 2014-10-10 ENCOUNTER — Telehealth: Payer: Self-pay | Admitting: Oncology

## 2014-10-10 ENCOUNTER — Ambulatory Visit (HOSPITAL_BASED_OUTPATIENT_CLINIC_OR_DEPARTMENT_OTHER): Payer: Medicare Other | Admitting: Oncology

## 2014-10-10 VITALS — BP 125/95 | HR 63 | Temp 97.7°F | Resp 18 | Ht 65.0 in | Wt 164.3 lb

## 2014-10-10 DIAGNOSIS — C3412 Malignant neoplasm of upper lobe, left bronchus or lung: Secondary | ICD-10-CM

## 2014-10-10 NOTE — Progress Notes (Signed)
  [facility] OFFICE PROGRESS NOTE   Diagnosis: Non-small cell lung cancer  INTERVAL HISTORY:   He returns as scheduled. He feels well. He underwent removal of a left ureteral stone and a TUR of the prostate 08/22/2014. He continues to have incontinence symptoms.  Objective:  Vital signs in last 24 hours:  Blood pressure 125/95, pulse 63, temperature 97.7 F (36.5 C), temperature source Oral, resp. rate 18, height 5\' 5"  (1.651 m), weight 164 lb 4.8 oz (74.526 kg), SpO2 97.00%.    HEENT: Neck without mass. No evidence of recurrent tumor at the right face Lymphatics: No cervical, supraclavicular, or axillary node Resp: Lungs clear bilaterally Cardio: Regular rate and rhythm GI: No hepatosplenomegaly Vascular: No leg edema    Imaging: Chest CT 06/16/2014-no evidence of recurrent lung cancer   Medications: I have reviewed the patient's current medications.  Assessment/Plan: 1. 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 Local recurrence of squamous cell carcinoma and a right face lymph node in 2006, status post surgical excision followed by radiation  3. Left hilar fullness on a chest x-ray 02/26/2012-stable compared to a chest x-ray from February of 2012 and new compared to a chest x-ray 2007 . A chest CT on 05/05/2012 confirmed a left upper lung nodule consistent with a primary bronchogenic carcinoma. A PET scan on 05/18/12 confirmed a hypermetabolic left upper lung nodule with no evidence of thoracic nodal or extrathoracic hypermetabolic disease  -He is status post a left upper lobectomy on 06/16/2012 with the pathology confirming a 3.8 cm squamous cell carcinoma (T2a, N0)  -Restaging CT 06/16/2014 without evidence of recurrent disease  4. History of BPH , status post a laser vaporization of the prostate in February 2014 and TUR August 2015  5. History of multiple skin cancers including basal cell carcinoma and squamous cell  carcinomas  6. coronary artery disease, decreased left ventricular ejection fraction-followed by Dr. Radford Pax     Disposition:  Mr. Ickes remains in clinical remission from non-small cell lung cancer. He will return for an office visit in June of 2016. He will see Dr. Servando Snare in November.  Betsy Coder, MD  10/10/2014  8:51 AM

## 2014-10-10 NOTE — Telephone Encounter (Signed)
gv pt appt schedule for june 2016

## 2014-10-26 ENCOUNTER — Other Ambulatory Visit: Payer: Self-pay | Admitting: Dermatology

## 2014-11-07 ENCOUNTER — Other Ambulatory Visit: Payer: Self-pay | Admitting: *Deleted

## 2014-11-07 DIAGNOSIS — C3410 Malignant neoplasm of upper lobe, unspecified bronchus or lung: Secondary | ICD-10-CM

## 2014-11-16 ENCOUNTER — Other Ambulatory Visit (INDEPENDENT_AMBULATORY_CARE_PROVIDER_SITE_OTHER): Payer: Medicare Other | Admitting: *Deleted

## 2014-11-16 DIAGNOSIS — Z79899 Other long term (current) drug therapy: Secondary | ICD-10-CM

## 2014-11-16 DIAGNOSIS — E785 Hyperlipidemia, unspecified: Secondary | ICD-10-CM

## 2014-11-16 LAB — LIPID PANEL
CHOL/HDL RATIO: 4
Cholesterol: 272 mg/dL — ABNORMAL HIGH (ref 0–200)
HDL: 70.2 mg/dL (ref >39.00)
LDL Cholesterol: 171 mg/dL — ABNORMAL HIGH (ref 0–99)
NonHDL: 201.8
Triglycerides: 152 mg/dL — ABNORMAL HIGH (ref 0.0–149.0)
VLDL: 30.4 mg/dL (ref 0.0–40.0)

## 2014-11-16 LAB — HEPATIC FUNCTION PANEL
ALT: 22 U/L (ref 0–53)
AST: 20 U/L (ref 0–37)
Albumin: 4 g/dL (ref 3.5–5.2)
Alkaline Phosphatase: 69 U/L (ref 39–117)
BILIRUBIN DIRECT: 0.1 mg/dL (ref 0.0–0.3)
TOTAL PROTEIN: 7 g/dL (ref 6.0–8.3)
Total Bilirubin: 1.3 mg/dL — ABNORMAL HIGH (ref 0.2–1.2)

## 2014-12-15 ENCOUNTER — Ambulatory Visit (INDEPENDENT_AMBULATORY_CARE_PROVIDER_SITE_OTHER): Payer: Medicare Other | Admitting: Cardiothoracic Surgery

## 2014-12-15 ENCOUNTER — Ambulatory Visit
Admission: RE | Admit: 2014-12-15 | Discharge: 2014-12-15 | Disposition: A | Discharge status: Home / Self Care | Payer: Medicare Other | Source: Ambulatory Visit | Attending: Cardiothoracic Surgery | Admitting: Cardiothoracic Surgery

## 2014-12-15 ENCOUNTER — Encounter: Payer: Self-pay | Admitting: Cardiothoracic Surgery

## 2014-12-15 VITALS — BP 104/61 | HR 72 | Resp 16 | Ht 65.0 in | Wt 169.0 lb

## 2014-12-15 DIAGNOSIS — C3410 Malignant neoplasm of upper lobe, unspecified bronchus or lung: Secondary | ICD-10-CM

## 2014-12-15 DIAGNOSIS — Z902 Acquired absence of lung [part of]: Secondary | ICD-10-CM

## 2014-12-15 DIAGNOSIS — Z85118 Personal history of other malignant neoplasm of bronchus and lung: Secondary | ICD-10-CM

## 2014-12-15 DIAGNOSIS — Z9889 Other specified postprocedural states: Secondary | ICD-10-CM

## 2014-12-15 NOTE — Progress Notes (Signed)
[address]       [place] [address]             [phone]                               Sherril H Seever [facility] Medical Record #[id number] Date of Birth: 22-Jan-1936  Ladell Pier, MD Irven Shelling, MD  Chief Complaint:   PostOp Follow Up Visit 06/19/2012  OPERATIVE REPORT  PREOPERATIVE DIAGNOSIS: Left upper lobe lung mass.  POSTOPERATIVE DIAGNOSES: Left upper lobe lung mass, squamous cell  carcinoma, non-small cell carcinoma by frozen section.  PROCEDURE PERFORMED: Video bronchoscopy, left video-assisted  thoracoscopy with mini thoracotomy, resection of left upper lobe and  portion of superior segment left lower lobe and lymph node dissection.   Stage IB, (pT2a,pN0,cM0 SQUAMOUS CELL CARCINOMA, 3.8 CM.)  History of Present Illness:      Patient seems to be doing well following resection he notes some SOB walking up hill with a ladder. Patient has returned to normal physical activities. He's had no anginal type chest pains or evidence of recurrent arrhythmia. Patient returns today for a followup visit and a followup CT scan now 30  months after resection of stage IB squamous cell carcinoma. Patient notes recently being treated for urinary tract infection. Patient notes incontinence following prostate surgery, currently followed by urology and did have renal stones removed.  History  Smoking status  . Former Smoker  . Types: Cigarettes  . Quit date: 12/30/1965  Smokeless tobacco  . Never Used       Allergies  Allergen Reactions  . Lipitor [Atorvastatin] Other (See Comments)    Muscle pain  . Ramipril Cough    Current Outpatient Prescriptions  Medication Sig Dispense Refill  . aspirin 81 MG tablet Take 81 mg by mouth daily.    Marland Kitchen b complex vitamins tablet Take 1 tablet by mouth daily.    . carvedilol (COREG) 12.5 MG tablet Take 12.5 mg by mouth 2 (two) times daily with a meal.    . ezetimibe (ZETIA) 10 MG tablet Take 10 mg by  mouth every morning.     . isosorbide mononitrate (IMDUR) 30 MG 24 hr tablet Take 30 mg by mouth every morning.    . Multiple Vitamin (MULTIVITAMIN WITH MINERALS) TABS tablet Take 1 tablet by mouth daily.    . Omega 3 1200 MG CAPS Take 1 capsule by mouth daily.     No current facility-administered medications for this visit.       Physical Exam: BP 104/61 mmHg  Pulse 72  Resp 16  Ht 5\' 5"  (1.651 m)  Wt 169 lb (76.658 kg)  BMI 28.12 kg/m2  SpO2 98%  General appearance: alert and cooperative Neurologic: intact Heart: regular rate and rhythm, S1, S2 normal, no murmur, click, rub or gallop and normal apical impulse Lungs: clear to auscultation bilaterally and normal percussion bilaterally Abdomen: soft, non-tender; bowel sounds normal; no masses,  no organomegaly Extremities: extremities normal, atraumatic, no cyanosis or edema and Homans sign is negative, no sign of DVT Wound: well healed Wounds:no adenopathy  Diagnostic Studies & Laboratory data:         Recent Radiology Findings: Ct Chest Wo Contrast  12/15/2014   CLINICAL DATA:  Lung cancer status post left upper lobectomy in 2013. Former smoker. Subsequent encounter.  EXAM: CT CHEST WITHOUT CONTRAST  TECHNIQUE: Multidetector CT imaging  of the chest was performed following the standard protocol without IV contrast.  COMPARISON:  Chest CT 06/16/2014 and 11/30/2013.  FINDINGS: Mediastinum: There are no enlarged mediastinal or hilar lymph nodes. There is stable asymmetry of the thyroid gland status post possible previous right thyroidectomy. The trachea appears normal. There is a small hiatal hernia. The heart size is normal. There is no pericardial effusion.There is diffuse atherosclerosis of the aorta, great vessels and coronary arteries. Mild dilatation of the ascending aorta appears unchanged.  Lungs/Pleura: There is no pleural effusion.There are stable postsurgical changes status post left upper lobe resection. There is stable  peripheral scarring at the left lung base. There is stable biapical scarring, likely related to previous radiation therapy. No new or enlarging pulmonary nodules identified. There is no endobronchial lesion.  Upper abdomen: Nonobstructing calculi are again noted in the lower pole of the left kidney. No hydronephrosis currently demonstrated. No evidence of adrenal mass.  Musculoskeletal/Chest wall: There is no axillary adenopathy or chest wall mass. There are no suspicious osseous findings. 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  IMPRESSION: 1. Stable postoperative appearance of the chest status post upper lobe resection. 2. No evidence of local recurrence or metastatic disease. 3. No acute findings demonstrated.   Electronically Signed   By: Camie Patience M.D.   On: 12/15/2014 12:53    Ct Chest Wo Contrast  06/16/2014   CLINICAL DATA:  History of lung cancer status post left upper lobectomy in June of 2013. Cough.  EXAM: CT CHEST WITHOUT CONTRAST  TECHNIQUE: Multidetector CT imaging of the chest was performed following the standard protocol without IV contrast.  COMPARISON:  Chest CT 11/30/2013.  FINDINGS: Mediastinum: Heart size is normal. There is no significant pericardial fluid, thickening or pericardial calcification. There is atherosclerosis of the thoracic aorta, the great vessels of the mediastinum and the coronary arteries, including calcified atherosclerotic plaque in the left main, left anterior descending, left circumflex and right coronary arteries. No pathologically enlarged mediastinal or hilar lymph nodes. Please note that accurate exclusion of hilar adenopathy is limited on noncontrast CT scans. Esophagus is unremarkable in appearance.  Lungs/Pleura: Status post left upper lobectomy. Mild scarring in the lateral aspect of the left lower lobe, similar to prior examinations. There is also some pleuroparenchymal thickening and architectural  distortion in the right apex, unchanged, presumably chronic scarring. No suspicious appearing pulmonary nodules or masses. No acute consolidative airspace disease. No pleural effusions.  Upper Abdomen: Tiny 2-3 mm nonobstructive calculi are present within the lower pole collecting system of the left kidney. The larger calculi in this region noted on the prior examination from 11/30/2013 are no longer identified. Additionally, there now appears to be some mild proximal hydroureteronephrosis, which could indicate distal passage of the previously noted stones and associated mild left ureteral obstruction.  Musculoskeletal: There are no aggressive appearing lytic or blastic lesions noted in the visualized portions of the skeleton. Postthoracotomy changes in the left hemithorax again noted.  IMPRESSION: 1. Status post left upper lobectomy without findings to suggest local recurrence of disease or metastatic disease in the thorax. 2. Although there are tiny nonobstructive calculi in the lower pole collecting system of the left kidney, the larger stones noted in this region on the prior examination are no longer identified, and there is new mild left hydroureteronephrosis. Clinical correlation for signs and symptoms of left-sided renal colic and urinary tract obstruction is recommended. Further evaluation with noncontrast CT  of the abdomen and pelvis may provide additional diagnostic information if clinically appropriate. 3. Atherosclerosis, including left main and 3 vessel coronary artery disease. Assessment for potential risk factor modification, dietary therapy or pharmacologic therapy may be warranted, if clinically indicated. 4. Additional incidental findings, as above. These results will be called to the ordering clinician or representative by the Radiologist Assistant, and communication documented in the PACS or zVision Dashboard.   Electronically Signed   By: Vinnie Langton M.D.   On: 06/16/2014 11:10  Ct Chest  Wo Contrast  11/30/2013   CLINICAL DATA:  Smoker for 50 years. Left lung cancer June 2013. No chest complaints.  EXAM: CT CHEST WITHOUT CONTRAST  TECHNIQUE: Multidetector CT imaging of the chest was performed following the standard protocol without IV contrast.  COMPARISON:  05/05/2013  FINDINGS: The central airways are patent. There is left lung volume loss. There are fibrotic changes at the left lung weighs from prior surgery. There is no new pulmonary mass or nodule. There is no pleural effusion or pneumothorax.  There are no pathologically enlarged axillary, hilar or mediastinal lymph nodes.  The heart size is normal. There is no pericardial effusion. The thoracic aorta is normal in caliber. There is coronary artery atherosclerosis involving the LAD, circumflex and right coronary artery.  Review of bone windows demonstrates no focal lytic or sclerotic lesions. There is lower cervical spine degenerative disc disease.  Limited non-contrast images of the upper abdomen were obtained. The adrenal glands appear normal. There is left nephrolithiasis.  IMPRESSION: 1. No evidence of residual or recurrent chest malignancy. Posttreatment changes from prior left upper lobectomy again noted.  2.  Multi-vessel coronary artery disease.  3.  Left nephrolithiasis.   Electronically Signed   By: Kathreen Devoid   On: 11/30/2013 15:39    Recent Labs: Lab Results  Component Value Date   WBC 5.1 08/17/2014   HGB 14.0 08/17/2014   HCT 42.1 08/17/2014   PLT 179 08/17/2014   GLUCOSE 140* 08/17/2014   CHOL 272* 11/16/2014   TRIG 152.0* 11/16/2014   HDL 70.20 11/16/2014   LDLDIRECT 147.6 11/08/2013   LDLCALC 171* 11/16/2014   ALT 22 11/16/2014   AST 20 11/16/2014   NA 141 08/17/2014   K 4.1 08/17/2014   CL 103 08/17/2014   CREATININE 0.93 08/17/2014   BUN 18 08/17/2014   CO2 25 08/17/2014   TSH 5.698* 11/30/2007   INR 1.21 06/24/2012   Path: Diagnosis 1. Lymph node, biopsy, 10L - ANTHRACOTIC LYMPH NODE. NO  TUMOR IDENTIFIED. 2. Lung, resection (segmental or lobe), Left upper lung - SQUAMOUS CELL CARCINOMA, 3.8 CM. - MARGINS NOT INVOLVED. - TWO BENIGN ANTHRACOTIC LYMPH NODES (0/2). 3. Lymph node, biopsy, 2L - ANTHRACOTIC LYMPH NODE. NO TUMOR IDENTIFIED. 4. Lymph node, biopsy, 4L - ANTHRACOTIC LYMPH NODE. NO TUMOR IDENTIFIED. Microscopic Comment 2. LUNG Specimen, including laterality: Left upper lung lobe. Procedure: Lobectomy. Specimen integrity (intact/disrupted): Intact. Tumor site: Left upper lobe. Tumor focality: Unifocal. Maximum tumor size (cm): 3.8 cm. Histologic type: Squamous cell carcinoma. Grade: II. Margins: Free of tumor. Visceral pleural invasion: No. Tumor extension: Involves hilum of left upper lobe. Treatment effect (if treated with neoadjuvant therapy): No. Lymph -Vascular invasion: Not identified. Lymph nodes: Number examined - 5; Number N1 nodes positive 0; Number N2 nodes positive 0. TNM code: pT2a, pN0. Ancillary studies: Can be performed if requested. Non-neoplastic lung: Unremarkable. (JDP:eps 06/17/12)  Assessment / Plan:      Patient stable following left upper lobectomy  for a squamous cell carcinoma stage Ib (pT2a, pN0.) Resected June of 2013. CT scan of the chest shows no evidence of recurrent carcinoma. We'll plan to see the patient back in one year with a follow-up CT scan of the chest   Grace Isaac MD 12/15/2014 2:36 PM

## 2015-01-12 ENCOUNTER — Encounter (HOSPITAL_COMMUNITY): Payer: Self-pay | Admitting: Cardiothoracic Surgery

## 2015-06-02 ENCOUNTER — Telehealth: Payer: Self-pay | Admitting: Oncology

## 2015-06-02 NOTE — Telephone Encounter (Signed)
Due to PAL moved 6/10 f/u to 7/8. Per patient not able to come 7/8 and f/u moved to 7/14. Patient has new d/t.

## 2015-06-09 ENCOUNTER — Ambulatory Visit: Payer: Medicare Other | Admitting: Oncology

## 2015-07-07 ENCOUNTER — Ambulatory Visit: Payer: Self-pay | Admitting: Oncology

## 2015-07-13 ENCOUNTER — Telehealth: Payer: Self-pay | Admitting: Oncology

## 2015-07-13 ENCOUNTER — Ambulatory Visit (HOSPITAL_BASED_OUTPATIENT_CLINIC_OR_DEPARTMENT_OTHER): Payer: PPO | Admitting: Oncology

## 2015-07-13 VITALS — BP 122/70 | HR 66 | Temp 98.5°F | Resp 18 | Wt 167.8 lb

## 2015-07-13 DIAGNOSIS — C3412 Malignant neoplasm of upper lobe, left bronchus or lung: Secondary | ICD-10-CM

## 2015-07-13 NOTE — Telephone Encounter (Signed)
Gave and printed appt sched and avs fo rpt for April 2017

## 2015-07-13 NOTE — Progress Notes (Signed)
error 

## 2015-07-13 NOTE — Progress Notes (Signed)
  [name] [name] OFFICE PROGRESS NOTE   Diagnosis: Non-small cell lung cancer  INTERVAL HISTORY:   He returns as scheduled. He feels well. No specific complaint. He saw Dr. Servando Snare in December 2015. A chest CT revealed no evidence of cancer.  Objective:  Vital signs in last 24 hours:  Blood pressure 87/49, pulse 66, temperature 98.5 F (36.9 C), temperature source Oral, resp. rate 18, weight 167 lb 12.8 oz (76.114 kg), SpO2 97 %. repeat manual blood pressure 122/70    HEENT: Postsurgical and radiation changes over the right neck, no evidence of recurrent tumor. Lymphatics: No cervical, supra-clavicular, or axillar nodes Resp: Lungs clear bilaterally Cardio: Regular rate and rhythm GI: No hepatomegaly Vascular: No leg edema  Skin: No evidence of recurrent tumor at the right face.   Medications: I have reviewed the patient's current medications.  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 Local recurrence of squamous cell carcinoma and a right face lymph node in 2006, status post surgical excision followed by radiation  3. Left hilar fullness on a chest x-ray 02/26/2012-stable compared to a chest x-ray from February of 2012 and new compared to a chest x-ray 2007 . A chest CT on 05/05/2012 confirmed a left upper lung nodule consistent with a primary bronchogenic carcinoma. A PET scan on 05/18/12 confirmed a hypermetabolic left upper lung nodule with no evidence of thoracic nodal or extrathoracic hypermetabolic disease  -He is status post a left upper lobectomy on 06/16/2012 with the pathology confirming a 3.8 cm squamous cell carcinoma (T2a, N0)  -Restaging CT 06/16/2014 without evidence of recurrent disease  -Restaging CT 12/15/2014 without evidence of recurrent disease 4. History of BPH , status post a laser vaporization of the prostate in February 2014 and TUR August 2015  5. History of multiple skin  cancers including basal cell carcinoma and squamous cell carcinomas  6. coronary artery disease, decreased left ventricular ejection fraction-followed by Dr. Radford Pax      Disposition:  [name] [name] remains in clinical remission from non-small cell lung cancer. He would like to continue follow-up at the [facility]. He will return for an office visit in 9 months. He will see Dr. Servando Snare with a restaging chest CT in December 2016. He continues skin cancer follow-up with Dr. Ronnald Ramp.  Betsy Coder, MD  07/13/2015  12:53 PM

## 2015-07-21 ENCOUNTER — Other Ambulatory Visit: Payer: Self-pay | Admitting: *Deleted

## 2015-07-21 NOTE — Telephone Encounter (Signed)
Pt lvm about rx refill for Carvedilol and Isosorbide. Was unable to reach pt. Will forward to Dr. Radford Pax and Nurse, pt has not been seen since 2014.

## 2015-07-22 NOTE — Telephone Encounter (Signed)
Who has been filling these?

## 2015-07-24 ENCOUNTER — Other Ambulatory Visit: Payer: Self-pay

## 2015-07-24 NOTE — Telephone Encounter (Signed)
Informed patient that because he was last seen in 2014, he will need to have an OV to obtain refills. Patient st he wants to change cardiologists and has enough medication for the meantime. Instructed him to call if he changes his mind. An appointment will be scheduled and refills will be sent. Patient agrees with treatment plan.

## 2015-07-26 ENCOUNTER — Telehealth: Payer: Self-pay | Admitting: Cardiology

## 2015-07-26 MED ORDER — ISOSORBIDE MONONITRATE ER 30 MG PO TB24: 30.0000 mg | ORAL_TABLET | Freq: Every morning | ORAL | Status: DC

## 2015-07-26 MED ORDER — CARVEDILOL 12.5 MG PO TABS: 12.5000 mg | ORAL_TABLET | Freq: Two times a day (BID) | ORAL | Status: DC

## 2015-07-26 NOTE — Telephone Encounter (Signed)
Patient wants medications refills. Informed the patient that he is well overdue for an office visit, so 1 Rx will be sent for 30 days only. Informed the patient he MUST COME to his visit scheduled 8/25 to continue getting medications. Patient agrees with treatment plan.

## 2015-07-26 NOTE — Telephone Encounter (Signed)
New message      Talk to [facility].  He said you would know what he wanted you both have talked before.  That is all he said.

## 2015-07-31 ENCOUNTER — Telehealth: Payer: Self-pay | Admitting: Cardiology

## 2015-07-31 DIAGNOSIS — E785 Hyperlipidemia, unspecified: Secondary | ICD-10-CM

## 2015-07-31 NOTE — Telephone Encounter (Signed)
Per patient request, FLP and ALT scheduled for 8/18.

## 2015-07-31 NOTE — Telephone Encounter (Signed)
New message    Patient calling  Would it not be wise for him to have a blood test - before his appt with Dr. Radford Pax.

## 2015-07-31 NOTE — Telephone Encounter (Signed)
FLP and ALT 

## 2015-08-17 ENCOUNTER — Other Ambulatory Visit (INDEPENDENT_AMBULATORY_CARE_PROVIDER_SITE_OTHER): Payer: PPO | Admitting: *Deleted

## 2015-08-17 DIAGNOSIS — E785 Hyperlipidemia, unspecified: Secondary | ICD-10-CM | POA: Diagnosis not present

## 2015-08-17 LAB — ALT: ALT: 12 U/L (ref 0–53)

## 2015-08-17 LAB — LIPID PANEL
CHOLESTEROL: 224 mg/dL — AB (ref 0–200)
HDL: 43.8 mg/dL (ref >39.00)
LDL Cholesterol: 155 mg/dL — ABNORMAL HIGH (ref 0–99)
NonHDL: 180.2
TRIGLYCERIDES: 124 mg/dL (ref 0.0–149.0)
Total CHOL/HDL Ratio: 5
VLDL: 24.8 mg/dL (ref 0.0–40.0)

## 2015-08-22 ENCOUNTER — Telehealth: Payer: Self-pay | Admitting: Cardiology

## 2015-08-22 NOTE — Telephone Encounter (Signed)
I spoke with the patient. 

## 2015-08-22 NOTE — Telephone Encounter (Signed)
New message     Pt returning your call about lab results

## 2015-08-24 ENCOUNTER — Encounter: Payer: Self-pay | Admitting: Cardiology

## 2015-08-24 ENCOUNTER — Ambulatory Visit (INDEPENDENT_AMBULATORY_CARE_PROVIDER_SITE_OTHER): Payer: PPO | Admitting: Cardiology

## 2015-08-24 VITALS — BP 86/56 | HR 77 | Ht 64.0 in | Wt 165.8 lb

## 2015-08-24 DIAGNOSIS — I2583 Coronary atherosclerosis due to lipid rich plaque: Principal | ICD-10-CM

## 2015-08-24 DIAGNOSIS — I255 Ischemic cardiomyopathy: Secondary | ICD-10-CM | POA: Diagnosis not present

## 2015-08-24 DIAGNOSIS — E785 Hyperlipidemia, unspecified: Secondary | ICD-10-CM | POA: Diagnosis not present

## 2015-08-24 DIAGNOSIS — I10 Essential (primary) hypertension: Secondary | ICD-10-CM | POA: Diagnosis not present

## 2015-08-24 DIAGNOSIS — I420 Dilated cardiomyopathy: Secondary | ICD-10-CM

## 2015-08-24 DIAGNOSIS — I2510 Atherosclerotic heart disease of native coronary artery without angina pectoris: Secondary | ICD-10-CM | POA: Diagnosis not present

## 2015-08-24 MED ORDER — CARVEDILOL 3.125 MG PO TABS: 3.1250 mg | ORAL_TABLET | Freq: Two times a day (BID) | ORAL | Status: DC

## 2015-08-24 NOTE — Patient Instructions (Addendum)
Medication Instructions:  Your physician has recommended you make the following change in your medication:  1) DECREASE COREG to 3.125 mg twice daily.  Labwork: None  Testing/Procedures: Your physician has requested that you have a lexiscan myoview. For further information please visit [email or web address]. Please follow instruction sheet, as given.  Follow-Up: Your physician recommends that you schedule a follow-up appointment in 1 WEEK with Gay Filler, [facility] in the [place].  Your physician wants you to follow-up in: 1 year with Dr. Radford Pax. You will receive a reminder letter in the mail two months in advance. If you don't receive a letter, please call our office to schedule the follow-up appointment.   Any Other Special Instructions Will Be Listed Below (If Applicable).

## 2015-08-24 NOTE — Progress Notes (Signed)
Cardiology Office Note   Date:  08/24/2015   ID:  [name] [name], DOB 1936/10/11, MRN [id number]  PCP:  Irven Shelling, MD    Chief Complaint  Patient presents with  . Follow-up    cad      History of Present Illness: [name] [name] is a 79 y.o. male with a histor of ASCAD, HTN, dyslipidemia and ischemic DCM. He is doing well. He denies any chest pain, LE edema, dizziness, palpitations or syncope. He has mild DOE with extreme exertion like carrying a ladder up a hill.     Past Medical History  Diagnosis Date  . Lung mass     Stage 1B non-small cell lung CA s/p resection 2013  . BPH (benign prostatic hyperplasia)     [name]/elevated PSA, prostate biopsy x4 including one saturation biopsy, laser treatment 2/14; NOCTURIA  . Shingles   . Heart murmur     as a child  . Chronic kidney disease     kidney stones -small passed.  . Trigger finger     Bilateral  . PVC (premature ventricular contraction)   . Cardiomyopathy     dilated cardiomyopathy EF 30%, MUGA  EF 42% 08/2012  . Sigmoid diverticulosis   . Carotid artery occlusion     carotid artery bruit  . Hypertension   . Hyperlipidemia     statin intolerant  . CAD (coronary artery disease) 2013    cath 05/2012 showing 80-90% stenosis of a trifurcating diagonal #1, 70-80% stenosis of OM3 and 90% stenosis of distal LCx after OM3 - medical management, [place]  . Diastolic dysfunction   . Arthritis     JOINT PAIN RIGHT HAND  . Squamous cell carcinoma, face     history of right face with mets to right upper cheek in 2004  and facial lymph node reoccurence post surgery with XRT  . Skin cancer     Multiple skin cancers     Past Surgical History  Procedure Laterality Date  . Mohs surgery  2004  . Excision ot metastatic lymph node followed by radiation  2006    .  Marland Kitchen Cataract extraction, bilateral    . Appendectomy    . Cardiac catheterization  05/2012    Has blockage- Treated with  Medications.  Cardiologist - Dr Radford Pax  . Video bronchoscopy  06/16/2012    Procedure: VIDEO BRONCHOSCOPY;  Surgeon: Grace Isaac, MD;  Location: [place];  Service: Thoracic;  Laterality: N/A;  . Lymph node dissection  06/16/2012    Procedure: LYMPH NODE DISSECTION;  Surgeon: Grace Isaac, MD;  Location: [place];  Service: Thoracic;;  . Video bronchoscopy  06/24/2012    Procedure: VIDEO BRONCHOSCOPY;  Surgeon: Grace Isaac, MD;  Location: [facility];  Service: Thoracic;  Laterality: N/A;  . Green light laser turp (transurethral resection of prostate N/A 02/08/2013    Procedure: GREEN LIGHT LASER TURP (TRANSURETHRAL RESECTION OF PROSTATE;  Surgeon: Ailene Rud, MD;  Location: [place];  Service: Urology;  Laterality: N/A;  . Appendectomy    . Transurethral resection of prostate Left 08/22/2014    Procedure: TRANSURETHRAL RESECTION OF THE PROSTATE (TURP) CYSTOSCOPY, LEFT RETROGRADE PYLEGRAM,LEFT with STENT,URETEROSCOPY,PLACEMENT OF BACKSTOP,LASER FRAGMENTATION WITH BASKET EXTRACTION LEFT URETEROSCOPY;  Surgeon: Ailene Rud, MD;  Location: [facility];  Service: Urology;  Laterality: Left;  . Holmium laser application Left 8/58/8502  Procedure: HOLMIUM LASER APPLICATION;  Surgeon: Ailene Rud, MD;  Location: [facility];  Service: Urology;  Laterality: Left;     Current Outpatient Prescriptions  Medication Sig Dispense Refill  . aspirin 81 MG tablet Take 81 mg by mouth daily.    Marland Kitchen b complex vitamins tablet Take 1 tablet by mouth daily.    . carvedilol (COREG) 12.5 MG tablet Take 1 tablet (12.5 mg total) by mouth 2 (two) times daily with a meal. 60 tablet 0  . ezetimibe (ZETIA) 10 MG tablet Take 10 mg by mouth every morning.     . isosorbide mononitrate (IMDUR) 30 MG 24 hr tablet Take 1 tablet (30 mg total) by mouth every morning. 30 tablet 0  . mirabegron ER (MYRBETRIQ) 25 MG TB24 tablet Take 25 mg by mouth 2 (two) times daily.     . Multiple Vitamin  (MULTIVITAMIN WITH MINERALS) TABS tablet Take 1 tablet by mouth daily.    . Omega 3 1200 MG CAPS Take 1 capsule by mouth daily.    . solifenacin (VESICARE) 5 MG tablet Take 5 mg by mouth daily.     No current facility-administered medications for this visit.    Allergies:   Lipitor and Ramipril    Social History:  The patient  reports that he quit smoking about 49 years ago. His smoking use included Cigarettes. He has never used smokeless tobacco. He reports that he drinks alcohol. He reports that he does not use illicit drugs.   Family History:  The patient's family history includes Diabetes in his father; Heart disease in his child and mother; Lung cancer in his mother; Psoriasis in his child.    ROS:  Please see the history of present illness.   Otherwise, review of systems are positive for none.   All other systems are reviewed and negative.    PHYSICAL EXAM: VS:  BP 86/56 mmHg  Pulse 77  Ht '5\' 4"'$  (1.626 m)  Wt 165 lb 12.8 oz (75.206 kg)  BMI 28.45 kg/m2 , BMI Body mass index is 28.45 kg/(m^2). GEN: Well nourished, well developed, in no acute distress HEENT: normal Neck: no JVD, carotid bruits, or masses Cardiac: RRR; no murmurs, rubs, or gallops,no edema  Respiratory:  clear to auscultation bilaterally, normal work of breathing GI: soft, nontender, nondistended, + BS MS: no deformity or atrophy Skin: warm and dry, no rash Neuro:  Strength and sensation are intact Psych: euthymic mood, full affect   EKG:  EKG was ordered today showing NSR with LVH and QRS widening and nonspecific T abnormality.  Prolonged QTc    Recent Labs: 08/17/2015: ALT 12    Lipid Panel    Component Value Date/Time   CHOL 224* 08/17/2015 0918   TRIG 124.0 08/17/2015 0918   HDL 43.80 08/17/2015 0918   CHOLHDL 5 08/17/2015 0918   VLDL 24.8 08/17/2015 0918   LDLCALC 155* 08/17/2015 0918   LDLDIRECT 147.6 11/08/2013 1025      Wt Readings from Last 3 Encounters:  08/24/15 165 lb 12.8 oz  (75.206 kg)  07/13/15 167 lb 12.8 oz (76.114 kg)  12/15/14 169 lb (76.658 kg)    ASSESSMENT AND PLAN:  1. ASCAD with no angina.  His last chest CT showed coronary calcifications in LM and 3 vessels.  He has not had a stress test in several years so I will get a Lexiscan myoview. - continue ASA/Imdur 2. HTN - soft today.  I am going to decrease Coreg to 3.'125mg'$  BID.  I will have him seen in HTN clinic in a week.  3. Dyslipidemia - he is statin intolerant - He could not afford the Welchol.  I have recommended that we refer him to lipid clinic for PCSK 9 inhibitor   4.      DCM - EF 42% by MUGA -no signs or symptoms of volume overload.   - continue Carvedilol   5.  Prolonged QT that corrects to normal after adjusting for prolonged QRS    Current medicines are reviewed at length with the patient today.  The patient does not have concerns regarding medicines.  The following changes have been made:  no change  Labs/ tests ordered today: See above Assessment and Plan No orders of the defined types were placed in this encounter.     Disposition:   FU with me in 1 year  Signed, Sueanne Margarita, MD  08/24/2015 2:48 PM    [address], [place], [place]  [address] Phone: [phone]; Fax: [phone]

## 2015-08-28 ENCOUNTER — Telehealth (HOSPITAL_COMMUNITY): Payer: Self-pay

## 2015-08-28 NOTE — Telephone Encounter (Signed)
Left message on voicemail in reference to upcoming appointment scheduled for 08-30-2015. Phone number given for a call back so details instructions can be given. Oletta Lamas, Elmyra Banwart A

## 2015-08-29 ENCOUNTER — Telehealth (HOSPITAL_COMMUNITY): Payer: Self-pay

## 2015-08-29 NOTE — Telephone Encounter (Signed)
Patient given detailed instructions per Myocardial Perfusion Study Information Sheet for test on 08-30-2015 at 0715. Patient Notified to arrive 15 minutes early, and that it is imperative to arrive on time for appointment to keep from having the test rescheduled. Patient verbalized understanding. Oletta Lamas, Raegan Sipp A

## 2015-08-30 ENCOUNTER — Ambulatory Visit (HOSPITAL_COMMUNITY): Payer: PPO | Attending: Cardiovascular Disease

## 2015-08-30 ENCOUNTER — Ambulatory Visit (INDEPENDENT_AMBULATORY_CARE_PROVIDER_SITE_OTHER): Payer: PPO | Admitting: Pharmacist

## 2015-08-30 ENCOUNTER — Other Ambulatory Visit: Payer: Self-pay

## 2015-08-30 VITALS — BP 118/84 | HR 82

## 2015-08-30 DIAGNOSIS — I10 Essential (primary) hypertension: Secondary | ICD-10-CM | POA: Diagnosis not present

## 2015-08-30 DIAGNOSIS — I2510 Atherosclerotic heart disease of native coronary artery without angina pectoris: Secondary | ICD-10-CM | POA: Diagnosis not present

## 2015-08-30 DIAGNOSIS — R0609 Other forms of dyspnea: Secondary | ICD-10-CM | POA: Diagnosis not present

## 2015-08-30 DIAGNOSIS — E785 Hyperlipidemia, unspecified: Secondary | ICD-10-CM

## 2015-08-30 DIAGNOSIS — I517 Cardiomegaly: Secondary | ICD-10-CM | POA: Insufficient documentation

## 2015-08-30 DIAGNOSIS — I2583 Coronary atherosclerosis due to lipid rich plaque: Secondary | ICD-10-CM

## 2015-08-30 DIAGNOSIS — R0602 Shortness of breath: Secondary | ICD-10-CM | POA: Insufficient documentation

## 2015-08-30 LAB — MYOCARDIAL PERFUSION IMAGING
CHL CUP NUCLEAR SRS: 6
CSEPPHR: 79 {beats}/min
LHR: 0.3
LV dias vol: 150 mL
LV sys vol: 99 mL
Rest HR: 64 {beats}/min
SDS: 1
SSS: 7
TID: 0.99

## 2015-08-30 MED ORDER — TECHNETIUM TC 99M SESTAMIBI GENERIC - CARDIOLITE
31.7000 | Freq: Once | INTRAVENOUS | Status: AC | PRN
  Administered 2015-08-30: 31.7 via INTRAVENOUS

## 2015-08-30 MED ORDER — ISOSORBIDE MONONITRATE ER 30 MG PO TB24: 30.0000 mg | ORAL_TABLET | Freq: Every morning | ORAL | Status: DC

## 2015-08-30 MED ORDER — REGADENOSON 0.4 MG/5ML IV SOLN
0.4000 mg | Freq: Once | INTRAVENOUS | Status: AC
  Administered 2015-08-30: 0.4 mg via INTRAVENOUS

## 2015-08-30 MED ORDER — TECHNETIUM TC 99M SESTAMIBI GENERIC - CARDIOLITE
10.8000 | Freq: Once | INTRAVENOUS | Status: AC | PRN
  Administered 2015-08-30: 11 via INTRAVENOUS

## 2015-08-30 NOTE — Progress Notes (Addendum)
Patient ID: [name] [name]                    DOB: 09/02/2036, 79 yo                        MRN: [id number]     HPI: [name] [name] is a 79 y.o. male patient referred to lipid and HTN clinic by Dr. Radford [name]. PMH is significant for CAD, ischemic heart disease, HTN, HLD, and BPH. Patient was referred to clinic after Coreg was tapered down d/t hypotension at recent visit with Dr. Radford [name]. Patient is also intolerant to multiple statins (pravastatin, Lipitor, and Crestor) and presents for lipid management.   BP: Patient was referred for recent hypotension in clinic. He reports that he checks his BP regularly at home. His systolic readings are usually in the 110s-140s. He reports only 2-3 low readings a month, and feels dizzy very rarely, although this tends to be when he stands up quickly. He has had 2 low BP readings in clinic of 86/56 in 07/2015 and 91/57 in 05/2014 - the rest of his readings are at goal < 150/90. His only BP medication is Coreg, which Dr. Radford [name] cut back on last week. Patient was taking Coreg 12.'5mg'$  BID. He was advised to start taking 3.'125mg'$  BID. However, patient reports that he wanted to use the rest of his old supply and had been cutting them in half (taking 6.'25mg'$  BID). He will reduce further to the 3.'125mg'$  dose once he finishes his last few pills. Of note, his PCP has also been titrating medications for his BPH including Myrbetriq and Vesicare. Given Vesicare's anticholinergic properties, this could also be contributing to his (minimal) dizziness. Pt will follow up with his PCP regarding his BPH medications, but does state that he wants to stop taking them because he doesn't feel like they're helping at all.   HLD: patient has a history of statin intolerance with pravastatin, Lipitor and Crestor which his [facility] physician prescribed ~10 years ago. Contacted PCP for detailed records - intolerances listed below. Patient refuses to try another statin, even dosed a few times a week. He reports  that he took Zetia for about a year and a half, but that his LDL did not drop at all so he stopped taking it. He also took Welchol in the past but did not do well with multiple times a day dosing - his insurance also stopped covering it and it became too expensive. Patient states that he was told about new injectable medication and is interested in this to help lower his cholesterol.  Current HLD Medications: fish oil Intolerances: Contacted Dr. Delene [name] office for previous intolerances - pravastatin '20mg'$ , '40mg'$ , and '80mg'$  in 2002. Lipitor '10mg'$  and '20mg'$  in 2002. Crestor '10mg'$  in September 2013. Patient with myalgias to all statins that he tried. Reports that with Lipitor in particular, he had myalgias and lost mobility in his right arm. He also reports memory issues. States that when he came off the Lipitor, memory issues resolved but he still could not use his right arm and that function never returned.  Risk Factors: CAD with 70-90% stenosis, age, sex LDL goal: '70mg'$ /dL  Current BP Medications: Coreg 6.'25mg'$  BID until he runs out of 12.'5mg'$  tablets, then will start new rx for 3.'125mg'$  BID BP goal: <150/40mHg. BP today 118/84, pulse 82 (30 mins after drinking coffee)  Diet: Patient has been on a Mediterranean diet for the past 55 years -  his wife cooks a lot. Typical breakfast - Cheerios, bagel, or fruit. Lunch - PB&J. Dinner - meat, potatoes, and veggies. Meat includes chicken, lamb, pork, sometimes eats fish or pasta too.  Exercise: Stays active with yard work, taking the stairs, shopping at the grocery store.  Family History: DM in his father, heart disease in his son and mother, maternal aunt died from MI at 58  Social History: Patient reports that he quit smoking about 49 years ago. His smoking use included cigarettes, he has never used smokeless tobacco.He reports that he drinks alcohol but does not use illicit drugs.  Labs: 07/2015: TC 224, TG 124, HDL 43.8, LDL 155, LFTs wnl (no  therapy) 10/2014: TC 272, TG 152, HDL 70.2, LDL 171, LFTs wnl (no therapy) 10/2013: TC 214, TG 146, HDL 46.9, LDL-d 147.6, LFTs wnl (no therapy)  Wt Readings from Last 3 Encounters:  08/30/15 165 lb (74.844 kg)  08/24/15 165 lb 12.8 oz (75.206 kg)  07/13/15 167 lb 12.8 oz (76.114 kg)   BP Readings from Last 3 Encounters:  08/24/15 86/56  07/13/15 122/70  12/15/14 104/61   Pulse Readings from Last 3 Encounters:  08/24/15 77  07/13/15 66  12/15/14 72    Past Medical History  Diagnosis Date  . Lung mass     Stage 1B non-small cell lung CA s/p resection 2013  . BPH (benign prostatic hyperplasia)     [name] [name]/elevated PSA, prostate biopsy x4 including one saturation biopsy, laser treatment 2/14; NOCTURIA  . Shingles   . Heart murmur     as a child  . Chronic kidney disease     kidney stones -small passed.  . Trigger finger     Bilateral  . PVC (premature ventricular contraction)   . Cardiomyopathy     dilated cardiomyopathy EF 30%, MUGA  EF 42% 08/2012  . Sigmoid diverticulosis   . Carotid artery occlusion     carotid artery bruit  . Hypertension   . Hyperlipidemia     statin intolerant  . CAD (coronary artery disease) 2013    cath 05/2012 showing 80-90% stenosis of a trifurcating diagonal #1, 70-80% stenosis of OM3 and 90% stenosis of distal LCx after OM3 - medical management, [name] [name]  . Diastolic dysfunction   . Arthritis     JOINT PAIN RIGHT HAND  . Squamous cell carcinoma, face     history of right face with mets to right upper cheek in 2004  and facial lymph node reoccurence post surgery with XRT  . Skin cancer     Multiple skin cancers     Current Outpatient Prescriptions on File Prior to Visit  Medication Sig Dispense Refill  . aspirin 81 MG tablet Take 81 mg by mouth daily.    Marland Kitchen b complex vitamins tablet Take 1 tablet by mouth daily.    . carvedilol (COREG) 3.125 MG tablet Take 1 tablet (3.125 mg total) by mouth 2 (two) times daily. 180 tablet 3  .  mirabegron ER (MYRBETRIQ) 25 MG TB24 tablet Take 25 mg by mouth 2 (two) times daily.     . Multiple Vitamin (MULTIVITAMIN WITH MINERALS) TABS tablet Take 1 tablet by mouth daily.    . Omega 3 1200 MG CAPS Take 1 capsule by mouth daily.    . solifenacin (VESICARE) 5 MG tablet Take 5 mg by mouth daily.     No current facility-administered medications on file prior to visit.    Allergies  Allergen Reactions  . Lipitor [Atorvastatin]  Other (See Comments)    Muscle pain  . Ramipril Cough    Assessment/Plan: 1. Hyperlipidemia - Patient currently with LDL '155mg'$ /dL above goal < '70mg'$ /dL given CAD with 70-90% stenosis. Patient is intolerant to multiple doses of pravastatin, Lipitor and Crestor and refuses to try any more statins given their effect on right arm mobility and his memory. He has previously tried [employer] which his insurance stopped covering, as well as Zetia which he reports had no effect on his LDL. Patient is interested in pursuing PCSK9i. Discussed potential copay with Praluent, as well as the potential to bring him into the donut hole. Patient understands this and would still like to pursue Praluent option. Discussed benefits, potential side effects, and injection technique. Will initiate paperwork for Praluent.  2. Hypotension - Patient reports systolic readings at home that are at goal <150/37mHg. He rarely has low readings other than the few lows in clinic. He will cut back to Coreg 3.'125mg'$  BID as recommended by Dr. TRadford Paxand will continue to monitor his BP.    Brigit Doke E. Sueko Dimichele, PharmD [id number] [place] [id number]: [phone] Fax: [phone]/31/2016 1:42 PM

## 2015-08-31 ENCOUNTER — Telehealth: Payer: Self-pay

## 2015-08-31 DIAGNOSIS — I5189 Other ill-defined heart diseases: Secondary | ICD-10-CM

## 2015-08-31 NOTE — Telephone Encounter (Signed)
-----   Message from Sueanne Margarita, MD sent at 08/31/2015  8:48 AM EDT ----- No ischemia on nuclear stress test - EF appears declined by nuclear - please order a cardiac MRI to assess EF further

## 2015-08-31 NOTE — Telephone Encounter (Signed)
Informed patient of results and verbal understanding expressed.  Cardiac MRI ordered for scheduling Patient agrees with treatment plan.

## 2015-09-05 ENCOUNTER — Encounter: Payer: Self-pay | Admitting: Cardiology

## 2015-09-07 ENCOUNTER — Encounter: Payer: Self-pay | Admitting: Cardiology

## 2015-09-11 ENCOUNTER — Telehealth: Payer: Self-pay | Admitting: Pharmacist

## 2015-09-11 ENCOUNTER — Other Ambulatory Visit: Payer: Self-pay | Admitting: Pharmacist

## 2015-09-11 DIAGNOSIS — E785 Hyperlipidemia, unspecified: Secondary | ICD-10-CM

## 2015-09-11 MED ORDER — ALIROCUMAB 75 MG/ML ~~LOC~~ SOPN: 1.0000 | PEN_INJECTOR | SUBCUTANEOUS | Status: DC

## 2015-09-11 NOTE — Telephone Encounter (Signed)
Left message for patient - Praluent approved by insurance. Sent rx to [facility] and informed patient to call us when he receives his first shipment of [place]. We will set up a time to give his first injection in clinic and order f/u lipid panel in 2 months at that time.

## 2015-09-14 ENCOUNTER — Other Ambulatory Visit: Payer: Self-pay | Admitting: Pharmacist

## 2015-09-14 ENCOUNTER — Other Ambulatory Visit (HOSPITAL_COMMUNITY): Payer: PPO

## 2015-09-14 MED ORDER — ALIROCUMAB 75 MG/ML ~~LOC~~ SOPN: 1.0000 | PEN_INJECTOR | SUBCUTANEOUS | Status: DC

## 2015-09-18 ENCOUNTER — Telehealth: Payer: Self-pay | Admitting: Cardiology

## 2015-09-18 NOTE — Telephone Encounter (Signed)
Gave pharmacy representative patient's allergies (lipitor and ramipril) and ICD-10 code for dyslipidemia.  Rep grateful for call back.

## 2015-09-18 NOTE — Telephone Encounter (Signed)
New problem   Need to know pt's allergic and diagnosis code. Please call back and speak to whom ever answer the phone.

## 2015-09-21 ENCOUNTER — Ambulatory Visit (HOSPITAL_COMMUNITY)
Admission: RE | Admit: 2015-09-21 | Discharge: 2015-09-21 | Disposition: A | Discharge status: Home / Self Care | Payer: PPO | Source: Ambulatory Visit | Attending: Cardiology | Admitting: Cardiology

## 2015-09-21 DIAGNOSIS — I081 Rheumatic disorders of both mitral and tricuspid valves: Secondary | ICD-10-CM | POA: Insufficient documentation

## 2015-09-21 DIAGNOSIS — I428 Other cardiomyopathies: Secondary | ICD-10-CM | POA: Diagnosis not present

## 2015-09-21 DIAGNOSIS — I429 Cardiomyopathy, unspecified: Secondary | ICD-10-CM | POA: Diagnosis not present

## 2015-09-21 DIAGNOSIS — I5189 Other ill-defined heart diseases: Secondary | ICD-10-CM

## 2015-09-21 LAB — CREATININE, SERUM
Creatinine, Ser: 1.01 mg/dL (ref 0.61–1.24)
GFR calc Af Amer: >60 mL/min (ref >60)
GFR calc non Af Amer: >60 mL/min (ref >60)

## 2015-09-21 MED ORDER — GADOBENATE DIMEGLUMINE 529 MG/ML IV SOLN
30.0000 mL | Freq: Once | INTRAVENOUS | Status: AC
  Administered 2015-09-21: 24 mL via INTRAVENOUS

## 2015-09-25 ENCOUNTER — Other Ambulatory Visit (HOSPITAL_COMMUNITY): Payer: Self-pay | Admitting: Internal Medicine

## 2015-09-25 ENCOUNTER — Telehealth: Payer: Self-pay

## 2015-09-25 ENCOUNTER — Other Ambulatory Visit: Payer: Self-pay | Admitting: Internal Medicine

## 2015-09-25 DIAGNOSIS — R609 Edema, unspecified: Secondary | ICD-10-CM

## 2015-09-25 DIAGNOSIS — M79601 Pain in right arm: Secondary | ICD-10-CM

## 2015-09-25 NOTE — Telephone Encounter (Signed)
-----   Message from Sueanne Margarita, MD sent at 09/24/2015  4:53 PM EDT ----- Cardiac MRI shows EF at 35 % with ischemic DCM.  Is on max heart failure meds that cannot be titrated further due to hypotension.  Please refer to EP for consideration of AICD for SCD prevention

## 2015-09-25 NOTE — Telephone Encounter (Signed)
Informed patient of results and verbal understanding expressed.   Patient agrees to EP referral for consideration of ICD. Message sent to EP scheduler.

## 2015-09-26 ENCOUNTER — Ambulatory Visit (HOSPITAL_COMMUNITY)
Admission: RE | Admit: 2015-09-26 | Discharge: 2015-09-26 | Disposition: A | Discharge status: Home / Self Care | Payer: PPO | Source: Ambulatory Visit | Attending: Internal Medicine | Admitting: Internal Medicine

## 2015-09-26 DIAGNOSIS — M7989 Other specified soft tissue disorders: Secondary | ICD-10-CM | POA: Insufficient documentation

## 2015-09-26 DIAGNOSIS — R609 Edema, unspecified: Secondary | ICD-10-CM

## 2015-09-26 NOTE — Progress Notes (Signed)
VASCULAR LAB PRELIMINARY  PRELIMINARY  PRELIMINARY  PRELIMINARY  Right Upper Ext. Venous Duplex Completed.  No evidence of deep vein thrombosis. Positive for a superficial vein clot in the basilic vein in the upper arm. Dr. Inda Merlin' office was notified.   Alla German, RVT 09/26/2015, 9:28 AM

## 2015-09-27 ENCOUNTER — Other Ambulatory Visit: Payer: PPO

## 2015-10-10 ENCOUNTER — Encounter: Payer: Self-pay | Admitting: Cardiology

## 2015-10-30 ENCOUNTER — Encounter: Payer: Self-pay | Admitting: Internal Medicine

## 2015-10-30 ENCOUNTER — Ambulatory Visit (INDEPENDENT_AMBULATORY_CARE_PROVIDER_SITE_OTHER): Payer: PPO | Admitting: Internal Medicine

## 2015-10-30 VITALS — BP 116/64 | HR 75 | Ht 64.0 in | Wt 165.8 lb

## 2015-10-30 DIAGNOSIS — I10 Essential (primary) hypertension: Secondary | ICD-10-CM

## 2015-10-30 DIAGNOSIS — I5022 Chronic systolic (congestive) heart failure: Secondary | ICD-10-CM | POA: Diagnosis not present

## 2015-10-30 DIAGNOSIS — I5023 Acute on chronic systolic (congestive) heart failure: Secondary | ICD-10-CM | POA: Insufficient documentation

## 2015-10-30 DIAGNOSIS — E785 Hyperlipidemia, unspecified: Secondary | ICD-10-CM

## 2015-10-30 DIAGNOSIS — I493 Ventricular premature depolarization: Secondary | ICD-10-CM | POA: Diagnosis not present

## 2015-10-30 NOTE — Assessment & Plan Note (Signed)
He does not have enough PVCs to make [place] think that his left ventricular dysfunction is related to this. He is asymptomatic. He will continue his current medications.

## 2015-10-30 NOTE — Assessment & Plan Note (Signed)
His symptoms are class IIa. His EF is 30-35%. He is not on an ACE inhibitor, but review of his records from 2013 demonstrated that he was on one and his dose was decreased and ultimately stopped, I presume secondary to hypotension. The patient and his wife have extensive questions, and I spent nearly an hour with the patient today discussing the indications, pros, cons, and imponderables about ICD implantation. He will consider his options and call us if he wishes to proceed with this device.

## 2015-10-30 NOTE — Progress Notes (Signed)
HPI [name] [name] is referred today by Dr. Radford Pax to consider ICD implant. He is a pleasant 79 year old man with a history of coronary artery disease, chronic class II systolic heart failure, ejection fraction 30-35%, limited stage lung cancer, status post resection, who is referred today to consider prophylactic ICD implantation. He has never had syncope. He has mild functional limitation. His QRS duration is narrow. The patient has very minimal peripheral edema. He remains active. Allergies  Allergen Reactions  . Lipitor [Atorvastatin] Other (See Comments)    Muscle pain  . Ramipril Cough  . Statins     cough     Current Outpatient Prescriptions  Medication Sig Dispense Refill  . aspirin 81 MG tablet Take 81 mg by mouth daily.    Marland Kitchen b complex vitamins tablet Take 1 tablet by mouth daily.    . carvedilol (COREG) 3.125 MG tablet Take 1 tablet (3.125 mg total) by mouth 2 (two) times daily. 180 tablet 3  . isosorbide mononitrate (IMDUR) 30 MG 24 hr tablet Take 1 tablet (30 mg total) by mouth every morning. 90 tablet 3  . Multiple Vitamin (MULTIVITAMIN WITH MINERALS) TABS tablet Take 1 tablet by mouth daily.    . Omega 3 1200 MG CAPS Take 1 capsule by mouth daily.     No current facility-administered medications for this visit.     Past Medical History  Diagnosis Date  . Lung mass     Stage 1B non-small cell lung CA s/p resection 2013  . BPH (benign prostatic hyperplasia)     [name]/elevated PSA, prostate biopsy x4 including one saturation biopsy, laser treatment 2/14; NOCTURIA  . Shingles   . Heart murmur     as a child  . Chronic kidney disease     kidney stones -small passed.  . Trigger finger     Bilateral  . PVC (premature ventricular contraction)   . Cardiomyopathy ([place])     dilated cardiomyopathy EF 30%, MUGA  EF 42% 08/2012  . Sigmoid diverticulosis   . Carotid artery occlusion     carotid artery bruit  . Hypertension   . Hyperlipidemia     statin  intolerant  . CAD (coronary artery disease) 2013    cath 05/2012 showing 80-90% stenosis of a trifurcating diagonal #1, 70-80% stenosis of OM3 and 90% stenosis of distal LCx after OM3 - medical management, [place]  . Diastolic dysfunction   . Arthritis     JOINT PAIN RIGHT HAND  . Squamous cell carcinoma, face     history of right face with mets to right upper cheek in 2004  and facial lymph node reoccurence post surgery with XRT  . Skin cancer     Multiple skin cancers     ROS:   All systems reviewed and negative except as noted in the HPI.   Past Surgical History  Procedure Laterality Date  . Mohs surgery  2004  . Excision ot metastatic lymph node followed by radiation  2006    .  Marland Kitchen Cataract extraction, bilateral    . Appendectomy    . Cardiac catheterization  05/2012    Has blockage- Treated with Medications.  Cardiologist - Dr Radford Pax  . Video bronchoscopy  06/16/2012    Procedure: VIDEO BRONCHOSCOPY;  Surgeon: Grace Isaac, MD;  Location: [facility];  Service: Thoracic;  Laterality: N/A;  . Lymph node dissection  06/16/2012    Procedure: LYMPH NODE DISSECTION;  Surgeon: Grace Isaac, MD;  Location: [facility] OR;  Service: Thoracic;;  . Video bronchoscopy  06/24/2012    Procedure: VIDEO BRONCHOSCOPY;  Surgeon: Grace Isaac, MD;  Location: [facility] OR;  Service: Thoracic;  Laterality: N/A;  . Green light laser turp (transurethral resection of prostate N/A 02/08/2013    Procedure: GREEN LIGHT LASER TURP (TRANSURETHRAL RESECTION OF PROSTATE;  Surgeon: Ailene Rud, MD;  Location: [facility];  Service: Urology;  Laterality: N/A;  . Appendectomy    . Transurethral resection of prostate Left 08/22/2014    Procedure: TRANSURETHRAL RESECTION OF THE PROSTATE (TURP) CYSTOSCOPY, LEFT RETROGRADE PYLEGRAM,LEFT with STENT,URETEROSCOPY,PLACEMENT OF BACKSTOP,LASER FRAGMENTATION WITH BASKET EXTRACTION LEFT URETEROSCOPY;  Surgeon: Ailene Rud, MD;  Location: [facility];   Service: Urology;  Laterality: Left;  . Holmium laser application Left 5/50/1586    Procedure: HOLMIUM LASER APPLICATION;  Surgeon: Ailene Rud, MD;  Location: [facility];  Service: Urology;  Laterality: Left;     Family History  Problem Relation Age of Onset  . Diabetes Father   . Psoriasis Child   . Heart disease Child     resuscitated from cardiac arrest from Brugada's syndrome  . Lung cancer Mother   . Heart disease Mother      Social History   Social History  . Marital Status: Married    Spouse Name: N/A  . Number of Children: N/A  . Years of Education: N/A   Occupational History  . Not on file.   Social History Main Topics  . Smoking status: Former Smoker    Types: Cigarettes    Quit date: 12/30/1965  . Smokeless tobacco: Never Used  . Alcohol Use: 0.0 oz/week    2-3 Glasses of wine per week     Comment: infrequently- "indulge heavly when I do"  . Drug Use: No  . Sexual Activity: Not on file   Other Topics Concern  . Not on file   Social History Narrative     BP 116/64 mmHg  Pulse 75  Ht '5\' 4"'$  (1.626 m)  Wt 165 lb 12.8 oz (75.206 kg)  BMI 28.45 kg/m2  Physical Exam:  Well appearing 79 year old man, NAD HEENT: Unremarkable Neck:  6 cm JVD, no thyromegally Lymphatics:  No adenopathy Back:  No CVA tenderness Lungs:  Clear, with no wheezes, rales, or rhonchi. HEART:  Regular rate rhythm, no murmurs, no rubs, no clicks Abd:  soft, positive bowel sounds, no organomegally, no rebound, no guarding Ext:  2 plus pulses, trace peripheral edema, no cyanosis, no clubbing Skin:  No rashes no nodules Neuro:  CN II through XII intact, motor grossly intact  EKG - sinus rhythm with no acute ST-T wave abnormality  Assess/Plan:

## 2015-10-30 NOTE — Assessment & Plan Note (Signed)
His blood pressure is currently low normal. He will continue his current medications.

## 2015-10-30 NOTE — Assessment & Plan Note (Signed)
He is statin intolerant. He is encouraged to maintain a low-fat diet.

## 2015-10-30 NOTE — Patient Instructions (Addendum)
Medication Instructions:  Your physician recommends that you continue on your current medications as directed. Please refer to the Current Medication list given to you today.   Labwork: None ordered   Testing/Procedures: None ordered   Follow-Up: Your physician recommends that you schedule a follow-up appointment as needed with Dr Lovena Le   Call if you decide to proceed with ICD implant(DC ICD)--[phone]   Any Other Special Instructions Will Be Listed Below (If Applicable).     If you need a refill on your cardiac medications before your next appointment, please call your pharmacy.

## 2015-11-29 ENCOUNTER — Other Ambulatory Visit: Payer: Self-pay | Admitting: Cardiothoracic Surgery

## 2015-11-29 DIAGNOSIS — C3490 Malignant neoplasm of unspecified part of unspecified bronchus or lung: Secondary | ICD-10-CM

## 2015-12-06 ENCOUNTER — Ambulatory Visit (INDEPENDENT_AMBULATORY_CARE_PROVIDER_SITE_OTHER): Payer: PPO | Admitting: Cardiothoracic Surgery

## 2015-12-06 ENCOUNTER — Encounter: Payer: Self-pay | Admitting: Cardiothoracic Surgery

## 2015-12-06 ENCOUNTER — Ambulatory Visit
Admission: RE | Admit: 2015-12-06 | Discharge: 2015-12-06 | Disposition: A | Discharge status: Home / Self Care | Payer: PPO | Source: Ambulatory Visit | Attending: Cardiothoracic Surgery | Admitting: Cardiothoracic Surgery

## 2015-12-06 VITALS — BP 105/69 | HR 76 | Resp 20 | Ht 64.0 in | Wt 165.0 lb

## 2015-12-06 DIAGNOSIS — Z85118 Personal history of other malignant neoplasm of bronchus and lung: Secondary | ICD-10-CM

## 2015-12-06 DIAGNOSIS — Z902 Acquired absence of lung [part of]: Secondary | ICD-10-CM

## 2015-12-06 DIAGNOSIS — C3490 Malignant neoplasm of unspecified part of unspecified bronchus or lung: Secondary | ICD-10-CM

## 2015-12-06 NOTE — Progress Notes (Signed)
[address]       [place] [address]             [phone]                               Kawon H Havel [place] Medical Record #[id number] Date of Birth: Oct 27, 1936  Lavone Orn, MD Irven Shelling, MD  Chief Complaint:   PostOp Follow Up Visit 06/19/2012  OPERATIVE REPORT  PREOPERATIVE DIAGNOSIS: Left upper lobe lung mass.  POSTOPERATIVE DIAGNOSES: Left upper lobe lung mass, squamous cell  carcinoma, non-small cell carcinoma by frozen section.  PROCEDURE PERFORMED: Video bronchoscopy, left video-assisted  thoracoscopy with mini thoracotomy, resection of left upper lobe and  portion of superior segment left lower lobe and lymph node dissection.   Stage IB, (pT2a,pN0,cM0 SQUAMOUS CELL CARCINOMA, 3.8 CM.)  History of Present Illness:      Patient seems to be doing well following resection he notes some SOB with exertion. Patient has returned to normal physical activities. He's had no anginal type chest pains or evidence of recurrent arrhythmia. Patient returns today for a followup visit and a followup CT scan now 42   months after resection of stage IB squamous cell carcinoma. He is currently being evaluated by cardiology for AICD/pacemaker.  History  Smoking status  . Former Smoker  . Types: Cigarettes  . Quit date: 12/30/1965  Smokeless tobacco  . Never Used       Allergies  Allergen Reactions  . Lipitor [Atorvastatin] Other (See Comments)    Muscle pain  . Ramipril Cough  . Statins     cough    Current Outpatient Prescriptions  Medication Sig Dispense Refill  . aspirin 81 MG tablet Take 81 mg by mouth daily.    Marland Kitchen b complex vitamins tablet Take 1 tablet by mouth daily.    . carvedilol (COREG) 3.125 MG tablet Take 1 tablet (3.125 mg total) by mouth 2 (two) times daily. 180 tablet 3  . Multiple Vitamin (MULTIVITAMIN WITH MINERALS) TABS tablet Take 1 tablet by mouth daily.    . Omega 3 1200 MG CAPS Take 2 capsules by mouth  daily.      No current facility-administered medications for this visit.       Physical Exam: BP 105/69 mmHg  Pulse 76  Resp 20  Ht '5\' 4"'$  (1.626 m)  Wt 165 lb (74.844 kg)  BMI 28.31 kg/m2  SpO2 96%  General appearance: alert and cooperative Neurologic: intact Heart: regular rate and rhythm, S1, S2 normal, no murmur, click, rub or gallop and normal apical impulse Lungs: clear to auscultation bilaterally and normal percussion bilaterally Abdomen: soft, non-tender; bowel sounds normal; no masses,  no organomegaly Extremities: extremities normal, atraumatic, no cyanosis or edema and Homans sign is negative, no sign of DVT Wound: well healed Wounds:no adenopathy  Diagnostic Studies & Laboratory data:         Recent Radiology Findings: Ct Chest Wo Contrast  12/06/2015  CLINICAL DATA:  Subsequent encounter for left lung cancer. EXAM: CT CHEST WITHOUT CONTRAST TECHNIQUE: Multidetector CT imaging of the chest was performed following the standard protocol without IV contrast. COMPARISON:  12/15/2014. FINDINGS: Mediastinum / Lymph Nodes: There is no axillary lymphadenopathy. 8 mm short axis precarinal lymph node is unchanged. No mediastinal or hilar lymphadenopathy. Suture material is seen in the left hilum. Volume loss is again noted in the left hemi  thorax. The heart size is stable. Coronary artery calcification is noted. The esophagus has normal imaging features. Lungs / Pleura: Lungs remain clear. No focal airspace consolidation. No pulmonary edema or pleural effusion. Scarring in the left lung again noted without features suspicious or worrisome for recurrent disease. Upper Abdomen: Nonobstructing stones seen lower pole left kidney. No adrenal nodule or mass. MSK / Soft Tissues: Bone windows reveal no worrisome lytic or sclerotic osseous lesions. IMPRESSION: 1. Stable exam. Status post left upper lobectomy. No evidence for recurrent disease. No new or progressive findings. Electronically  Signed   By: Misty Stanley M.D.   On: 12/06/2015 11:23    Ct Chest Wo Contrast  06/16/2014   CLINICAL DATA:  History of lung cancer status post left upper lobectomy in June of 2013. Cough.  EXAM: CT CHEST WITHOUT CONTRAST  TECHNIQUE: Multidetector CT imaging of the chest was performed following the standard protocol without IV contrast.  COMPARISON:  Chest CT 11/30/2013.  FINDINGS: Mediastinum: Heart size is normal. There is no significant pericardial fluid, thickening or pericardial calcification. There is atherosclerosis of the thoracic aorta, the great vessels of the mediastinum and the coronary arteries, including calcified atherosclerotic plaque in the left main, left anterior descending, left circumflex and right coronary arteries. No pathologically enlarged mediastinal or hilar lymph nodes. Please note that accurate exclusion of hilar adenopathy is limited on noncontrast CT scans. Esophagus is unremarkable in appearance.  Lungs/Pleura: Status post left upper lobectomy. Mild scarring in the lateral aspect of the left lower lobe, similar to prior examinations. There is also some pleuroparenchymal thickening and architectural distortion in the right apex, unchanged, presumably chronic scarring. No suspicious appearing pulmonary nodules or masses. No acute consolidative airspace disease. No pleural effusions.  Upper Abdomen: Tiny 2-3 mm nonobstructive calculi are present within the lower pole collecting system of the left kidney. The larger calculi in this region noted on the prior examination from 11/30/2013 are no longer identified. Additionally, there now appears to be some mild proximal hydroureteronephrosis, which could indicate distal passage of the previously noted stones and associated mild left ureteral obstruction.  Musculoskeletal: There are no aggressive appearing lytic or blastic lesions noted in the visualized portions of the skeleton. Postthoracotomy changes in the left hemithorax again noted.   IMPRESSION: 1. Status post left upper lobectomy without findings to suggest local recurrence of disease or metastatic disease in the thorax. 2. Although there are tiny nonobstructive calculi in the lower pole collecting system of the left kidney, the larger stones noted in this region on the prior examination are no longer identified, and there is new mild left hydroureteronephrosis. Clinical correlation for signs and symptoms of left-sided renal colic and urinary tract obstruction is recommended. Further evaluation with noncontrast CT of the abdomen and pelvis may provide additional diagnostic information if clinically appropriate. 3. Atherosclerosis, including left main and 3 vessel coronary artery disease. Assessment for potential risk factor modification, dietary therapy or pharmacologic therapy may be warranted, if clinically indicated. 4. Additional incidental findings, as above. These results will be called to the ordering clinician or representative by the Radiologist Assistant, and communication documented in the PACS or zVision Dashboard.   Electronically Signed   By: Vinnie Langton M.D.   On: 06/16/2014 11:10  Ct Chest Wo Contrast  11/30/2013   CLINICAL DATA:  Smoker for 50 years. Left lung cancer June 2013. No chest complaints.  EXAM: CT CHEST WITHOUT CONTRAST  TECHNIQUE: Multidetector CT imaging of the chest was performed following  the standard protocol without IV contrast.  COMPARISON:  05/05/2013  FINDINGS: The central airways are patent. There is left lung volume loss. There are fibrotic changes at the left lung weighs from prior surgery. There is no new pulmonary mass or nodule. There is no pleural effusion or pneumothorax.  There are no pathologically enlarged axillary, hilar or mediastinal lymph nodes.  The heart size is normal. There is no pericardial effusion. The thoracic aorta is normal in caliber. There is coronary artery atherosclerosis involving the LAD, circumflex and right coronary  artery.  Review of bone windows demonstrates no focal lytic or sclerotic lesions. There is lower cervical spine degenerative disc disease.  Limited non-contrast images of the upper abdomen were obtained. The adrenal glands appear normal. There is left nephrolithiasis.  IMPRESSION: 1. No evidence of residual or recurrent chest malignancy. Posttreatment changes from prior left upper lobectomy again noted.  2.  Multi-vessel coronary artery disease.  3.  Left nephrolithiasis.   Electronically Signed   By: Kathreen Devoid   On: 11/30/2013 15:39    Recent Labs: Lab Results  Component Value Date   WBC 5.1 08/17/2014   HGB 14.0 08/17/2014   HCT 42.1 08/17/2014   PLT 179 08/17/2014   GLUCOSE 140* 08/17/2014   CHOL 224* 08/17/2015   TRIG 124.0 08/17/2015   HDL 43.80 08/17/2015   LDLDIRECT 147.6 11/08/2013   LDLCALC 155* 08/17/2015   ALT 12 08/17/2015   AST 20 11/16/2014   NA 141 08/17/2014   K 4.1 08/17/2014   CL 103 08/17/2014   CREATININE 1.01 09/21/2015   BUN 18 08/17/2014   CO2 25 08/17/2014   TSH 5.698* 11/30/2007   INR 1.21 06/24/2012   Path: Diagnosis 1. Lymph node, biopsy, 10L - ANTHRACOTIC LYMPH NODE. NO TUMOR IDENTIFIED. 2. Lung, resection (segmental or lobe), Left upper lung - SQUAMOUS CELL CARCINOMA, 3.8 CM. - MARGINS NOT INVOLVED. - TWO BENIGN ANTHRACOTIC LYMPH NODES (0/2). 3. Lymph node, biopsy, 2L - ANTHRACOTIC LYMPH NODE. NO TUMOR IDENTIFIED. 4. Lymph node, biopsy, 4L - ANTHRACOTIC LYMPH NODE. NO TUMOR IDENTIFIED. Microscopic Comment 2. LUNG Specimen, including laterality: Left upper lung lobe. Procedure: Lobectomy. Specimen integrity (intact/disrupted): Intact. Tumor site: Left upper lobe. Tumor focality: Unifocal. Maximum tumor size (cm): 3.8 cm. Histologic type: Squamous cell carcinoma. Grade: II. Margins: Free of tumor. Visceral pleural invasion: No. Tumor extension: Involves hilum of left upper lobe. Treatment effect (if treated with neoadjuvant therapy):  No. Lymph -Vascular invasion: Not identified. Lymph nodes: Number examined - 5; Number N1 nodes positive 0; Number N2 nodes positive 0. TNM code: pT2a, pN0. Ancillary studies: Can be performed if requested. Non-neoplastic lung: Unremarkable. (JDP:eps 06/17/12)  Assessment / Plan:      Patient stable following left upper lobectomy for a squamous cell carcinoma stage Ib (pT2a, pN0.) Resected June of 2013. CT scan of the chest shows no evidence of recurrent carcinoma. We'll plan to see the patient back in one year with a follow-up CT scan of the chest   Grace Isaac MD 12/06/2015 3:01 PM

## 2016-01-17 DIAGNOSIS — H903 Sensorineural hearing loss, bilateral: Secondary | ICD-10-CM | POA: Diagnosis not present

## 2016-01-17 DIAGNOSIS — H9313 Tinnitus, bilateral: Secondary | ICD-10-CM | POA: Diagnosis not present

## 2016-01-17 DIAGNOSIS — H9193 Unspecified hearing loss, bilateral: Secondary | ICD-10-CM | POA: Diagnosis not present

## 2016-04-05 ENCOUNTER — Telehealth: Payer: Self-pay | Admitting: *Deleted

## 2016-04-05 ENCOUNTER — Telehealth: Payer: Self-pay | Admitting: Oncology

## 2016-04-05 NOTE — Telephone Encounter (Signed)
appt changed to 4/20 @ 11:15 per provider.. Pt is aware

## 2016-04-05 NOTE — Telephone Encounter (Signed)
Spoke with pt's wife, she agrees to move office visit out to 4/20 at 1115. Order to schedulers.

## 2016-04-11 ENCOUNTER — Ambulatory Visit: Payer: Self-pay | Admitting: Oncology

## 2016-04-18 ENCOUNTER — Telehealth: Payer: Self-pay | Admitting: Oncology

## 2016-04-18 ENCOUNTER — Ambulatory Visit (HOSPITAL_BASED_OUTPATIENT_CLINIC_OR_DEPARTMENT_OTHER): Payer: PPO | Admitting: Oncology

## 2016-04-18 VITALS — BP 131/79 | HR 70 | Temp 98.0°F | Resp 18 | Ht 64.0 in | Wt 168.6 lb

## 2016-04-18 DIAGNOSIS — C3412 Malignant neoplasm of upper lobe, left bronchus or lung: Secondary | ICD-10-CM

## 2016-04-18 DIAGNOSIS — Z85828 Personal history of other malignant neoplasm of skin: Secondary | ICD-10-CM

## 2016-04-18 DIAGNOSIS — I2510 Atherosclerotic heart disease of native coronary artery without angina pectoris: Secondary | ICD-10-CM | POA: Diagnosis not present

## 2016-04-18 NOTE — Telephone Encounter (Signed)
per pof to sch pt appt-gave pt copy of avs °

## 2016-04-18 NOTE — Progress Notes (Signed)
  [facility] OFFICE PROGRESS NOTE   Diagnosis: Non-small cell lung cancer, head and neck cancer  INTERVAL HISTORY:   [name] [name] returns as scheduled. He reports soreness in the throat and mild discomfort at the upper mid anterior chest with exertion. This resolves spontaneously after 10-15 minutes. No other complaint.     Objective:  Vital signs in last 24 hours:  Blood pressure 131/79, pulse 70, temperature 98 F (36.7 C), temperature source Oral, resp. rate 18, height '5\' 4"'$  (1.626 m), weight 168 lb 9.6 oz (76.476 kg), SpO2 99 %.    HEENT:  neck without mass Lymphatics:  no cervical, supraclavicular, or axillary nodes Resp:  lungs clear bilaterally Cardio:  regular rate and rhythm GI:  no hepatomegaly Vascular:  no leg edema  SkRight face scar is without evidence of recurrent tumor    Medications: I have reviewed the patient's current medications.  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 Local recurrence of squamous cell carcinoma and a right face lymph node in 2006, status post surgical excision followed by radiation  3. Left hilar fullness on a chest x-ray 02/26/2012-stable compared to a chest x-ray from February of 2012 and new compared to a chest x-ray 2007 . A chest CT on 05/05/2012 confirmed a left upper lung nodule consistent with a primary bronchogenic carcinoma. A PET scan on 05/18/12 confirmed a hypermetabolic left upper lung nodule with no evidence of thoracic nodal or extrathoracic hypermetabolic disease  -He is status post a left upper lobectomy on 06/16/2012 with the pathology confirming a 3.8 cm squamous cell carcinoma (T2a, N0)  -Restaging CT 06/16/2014 without evidence of recurrent disease  -Restaging CT 12/15/2014 without evidence of recurrent disease -Restaging CT 12/06/2015-no evidence of recurrent disease 4. History of BPH , status post a laser vaporization of the prostate in  February 2014 and TUR August 2015  5. History of multiple skin cancers including basal cell carcinoma and squamous cell carcinomas  6. coronary artery disease, decreased left ventricular ejection fraction-followed by Dr. Radford Pax      Disposition:   [name] [name] remains in clinical remission from skin cancer and lung cancer. He will continue surveillance CT scans with Dr. Servando Snare. He would like to [facility] at the [facility]. He will return for an office visit in one year. I recommended he  Schedule an appt.with Dr. Laurann Montana to evaluate the exertional chest discomfort.  Betsy Coder, MD  04/18/2016  11:34 AM

## 2016-04-30 DIAGNOSIS — Z1389 Encounter for screening for other disorder: Secondary | ICD-10-CM | POA: Diagnosis not present

## 2016-04-30 DIAGNOSIS — E7800 Pure hypercholesterolemia, unspecified: Secondary | ICD-10-CM | POA: Diagnosis not present

## 2016-04-30 DIAGNOSIS — I255 Ischemic cardiomyopathy: Secondary | ICD-10-CM | POA: Diagnosis not present

## 2016-04-30 DIAGNOSIS — R079 Chest pain, unspecified: Secondary | ICD-10-CM | POA: Diagnosis not present

## 2016-04-30 DIAGNOSIS — I10 Essential (primary) hypertension: Secondary | ICD-10-CM | POA: Diagnosis not present

## 2016-04-30 DIAGNOSIS — Z0000 Encounter for general adult medical examination without abnormal findings: Secondary | ICD-10-CM | POA: Diagnosis not present

## 2016-05-01 DIAGNOSIS — Z85828 Personal history of other malignant neoplasm of skin: Secondary | ICD-10-CM | POA: Diagnosis not present

## 2016-05-01 DIAGNOSIS — L570 Actinic keratosis: Secondary | ICD-10-CM | POA: Diagnosis not present

## 2016-05-01 DIAGNOSIS — L821 Other seborrheic keratosis: Secondary | ICD-10-CM | POA: Diagnosis not present

## 2016-05-01 DIAGNOSIS — L820 Inflamed seborrheic keratosis: Secondary | ICD-10-CM | POA: Diagnosis not present

## 2016-05-01 DIAGNOSIS — D2271 Melanocytic nevi of right lower limb, including hip: Secondary | ICD-10-CM | POA: Diagnosis not present

## 2016-05-01 DIAGNOSIS — I788 Other diseases of capillaries: Secondary | ICD-10-CM | POA: Diagnosis not present

## 2016-06-12 DIAGNOSIS — R0602 Shortness of breath: Secondary | ICD-10-CM | POA: Diagnosis not present

## 2016-06-12 DIAGNOSIS — I5022 Chronic systolic (congestive) heart failure: Secondary | ICD-10-CM | POA: Diagnosis not present

## 2016-06-12 DIAGNOSIS — I25118 Atherosclerotic heart disease of native coronary artery with other forms of angina pectoris: Secondary | ICD-10-CM | POA: Diagnosis not present

## 2016-06-26 DIAGNOSIS — I25118 Atherosclerotic heart disease of native coronary artery with other forms of angina pectoris: Secondary | ICD-10-CM | POA: Diagnosis not present

## 2016-06-26 DIAGNOSIS — Z01818 Encounter for other preprocedural examination: Secondary | ICD-10-CM | POA: Diagnosis not present

## 2016-06-26 DIAGNOSIS — R0602 Shortness of breath: Secondary | ICD-10-CM | POA: Diagnosis not present

## 2016-06-27 DIAGNOSIS — I208 Other forms of angina pectoris: Secondary | ICD-10-CM | POA: Diagnosis present

## 2016-07-10 ENCOUNTER — Ambulatory Visit
Admission: RE | Admit: 2016-07-10 | Discharge: 2016-07-10 | Disposition: A | Discharge status: Home / Self Care | Payer: PPO | Source: Ambulatory Visit | Attending: Internal Medicine | Admitting: Internal Medicine

## 2016-07-10 ENCOUNTER — Encounter: Payer: Self-pay | Admitting: *Deleted

## 2016-07-10 ENCOUNTER — Encounter
Admission: RE | Disposition: A | Discharge status: Home / Self Care | Payer: Self-pay | Source: Ambulatory Visit | Attending: Internal Medicine

## 2016-07-10 DIAGNOSIS — I200 Unstable angina: Secondary | ICD-10-CM | POA: Diagnosis not present

## 2016-07-10 DIAGNOSIS — Z79899 Other long term (current) drug therapy: Secondary | ICD-10-CM | POA: Insufficient documentation

## 2016-07-10 DIAGNOSIS — Z87891 Personal history of nicotine dependence: Secondary | ICD-10-CM | POA: Insufficient documentation

## 2016-07-10 DIAGNOSIS — Z7982 Long term (current) use of aspirin: Secondary | ICD-10-CM | POA: Insufficient documentation

## 2016-07-10 DIAGNOSIS — I25118 Atherosclerotic heart disease of native coronary artery with other forms of angina pectoris: Secondary | ICD-10-CM | POA: Diagnosis not present

## 2016-07-10 DIAGNOSIS — I25110 Atherosclerotic heart disease of native coronary artery with unstable angina pectoris: Secondary | ICD-10-CM | POA: Diagnosis not present

## 2016-07-10 DIAGNOSIS — Z888 Allergy status to other drugs, medicaments and biological substances status: Secondary | ICD-10-CM | POA: Diagnosis not present

## 2016-07-10 DIAGNOSIS — E7800 Pure hypercholesterolemia, unspecified: Secondary | ICD-10-CM | POA: Insufficient documentation

## 2016-07-10 DIAGNOSIS — I259 Chronic ischemic heart disease, unspecified: Secondary | ICD-10-CM | POA: Diagnosis not present

## 2016-07-10 DIAGNOSIS — E785 Hyperlipidemia, unspecified: Secondary | ICD-10-CM | POA: Insufficient documentation

## 2016-07-10 DIAGNOSIS — I5022 Chronic systolic (congestive) heart failure: Secondary | ICD-10-CM | POA: Insufficient documentation

## 2016-07-10 DIAGNOSIS — I110 Hypertensive heart disease with heart failure: Secondary | ICD-10-CM | POA: Insufficient documentation

## 2016-07-10 DIAGNOSIS — I208 Other forms of angina pectoris: Secondary | ICD-10-CM | POA: Diagnosis present

## 2016-07-10 DIAGNOSIS — I119 Hypertensive heart disease without heart failure: Secondary | ICD-10-CM | POA: Diagnosis not present

## 2016-07-10 HISTORY — PX: CARDIAC CATHETERIZATION: SHX172

## 2016-07-10 SURGERY — LEFT HEART CATH AND CORONARY ANGIOGRAPHY
Anesthesia: Moderate Sedation | Laterality: Left

## 2016-07-10 MED ORDER — SODIUM CHLORIDE 0.9 % IV SOLN: 250.0000 mL | INTRAVENOUS | Status: DC | PRN

## 2016-07-10 MED ORDER — MIDAZOLAM HCL 2 MG/2ML IJ SOLN
INTRAMUSCULAR | Status: DC | PRN
  Administered 2016-07-10: 1 mg via INTRAVENOUS

## 2016-07-10 MED ORDER — IOPAMIDOL (ISOVUE-300) INJECTION 61%
INTRAVENOUS | Status: DC | PRN
  Administered 2016-07-10: 130 mL via INTRA_ARTERIAL

## 2016-07-10 MED ORDER — HEPARIN (PORCINE) IN NACL 2-0.9 UNIT/ML-% IJ SOLN
INTRAMUSCULAR | Status: AC
  Filled 2016-07-10: qty 500

## 2016-07-10 MED ORDER — SODIUM CHLORIDE 0.9 % WEIGHT BASED INFUSION: 1.0000 mL/kg/h | INTRAVENOUS | Status: DC

## 2016-07-10 MED ORDER — SODIUM CHLORIDE 0.9% FLUSH: 3.0000 mL | Freq: Two times a day (BID) | INTRAVENOUS | Status: DC

## 2016-07-10 MED ORDER — SODIUM CHLORIDE 0.9% FLUSH: 3.0000 mL | INTRAVENOUS | Status: DC | PRN

## 2016-07-10 MED ORDER — FENTANYL CITRATE (PF) 100 MCG/2ML IJ SOLN
INTRAMUSCULAR | Status: AC
  Filled 2016-07-10: qty 2

## 2016-07-10 MED ORDER — ASPIRIN 81 MG PO CHEW: 81.0000 mg | CHEWABLE_TABLET | ORAL | Status: DC

## 2016-07-10 MED ORDER — FENTANYL CITRATE (PF) 100 MCG/2ML IJ SOLN
INTRAMUSCULAR | Status: DC | PRN
  Administered 2016-07-10: 25 ug via INTRAVENOUS

## 2016-07-10 MED ORDER — SODIUM CHLORIDE 0.9 % WEIGHT BASED INFUSION
3.0000 mL/kg/h | INTRAVENOUS | Status: DC
  Administered 2016-07-10: 3 mL/kg/h via INTRAVENOUS

## 2016-07-10 MED ORDER — MIDAZOLAM HCL 2 MG/2ML IJ SOLN
INTRAMUSCULAR | Status: AC
  Filled 2016-07-10: qty 2

## 2016-07-10 SURGICAL SUPPLY — 8 items
CATH INFINITI 5FR ANG PIGTAIL (CATHETERS) ×3 IMPLANT
CATH INFINITI 5FR JL4 (CATHETERS) ×3 IMPLANT
CATH INFINITI JR4 5F (CATHETERS) ×3 IMPLANT
KIT MANI 3VAL PERCEP (MISCELLANEOUS) ×3 IMPLANT
NEEDLE PERC 18GX7CM (NEEDLE) ×3 IMPLANT
PACK CARDIAC CATH (CUSTOM PROCEDURE TRAY) ×3 IMPLANT
SHEATH PINNACLE 5F 10CM (SHEATH) ×3 IMPLANT
WIRE EMERALD 3MM-J .035X150CM (WIRE) ×3 IMPLANT

## 2016-07-10 NOTE — Discharge Instructions (Signed)

## 2016-07-12 DIAGNOSIS — I5022 Chronic systolic (congestive) heart failure: Secondary | ICD-10-CM | POA: Diagnosis not present

## 2016-07-12 DIAGNOSIS — I2510 Atherosclerotic heart disease of native coronary artery without angina pectoris: Secondary | ICD-10-CM | POA: Diagnosis not present

## 2016-07-15 ENCOUNTER — Encounter: Payer: Self-pay | Admitting: Cardiothoracic Surgery

## 2016-07-15 ENCOUNTER — Other Ambulatory Visit: Payer: Self-pay | Admitting: *Deleted

## 2016-07-15 ENCOUNTER — Institutional Professional Consult (permissible substitution) (INDEPENDENT_AMBULATORY_CARE_PROVIDER_SITE_OTHER): Payer: PPO | Admitting: Cardiothoracic Surgery

## 2016-07-15 VITALS — BP 124/74 | HR 75 | Resp 16 | Ht 64.0 in | Wt 163.0 lb

## 2016-07-15 DIAGNOSIS — I25110 Atherosclerotic heart disease of native coronary artery with unstable angina pectoris: Secondary | ICD-10-CM

## 2016-07-15 DIAGNOSIS — I2510 Atherosclerotic heart disease of native coronary artery without angina pectoris: Secondary | ICD-10-CM

## 2016-07-15 DIAGNOSIS — Z85118 Personal history of other malignant neoplasm of bronchus and lung: Principal | ICD-10-CM

## 2016-07-15 DIAGNOSIS — Z01818 Encounter for other preprocedural examination: Secondary | ICD-10-CM

## 2016-07-15 DIAGNOSIS — Z08 Encounter for follow-up examination after completed treatment for malignant neoplasm: Secondary | ICD-10-CM

## 2016-07-15 NOTE — Progress Notes (Signed)
Patient ID: [name] [name], male   DOB: 10/26/1936, 80 y.o.   MRN: [id number]      [address]       [place] [address]             [phone]                    [name] [name] Medical Record #[id number] Date of Birth: [birth year]  Referring: [name] Skains, MD Primary Care: [name] Shelling, MD  Chief Complaint:    Chief Complaint  Patient presents with  . Coronary Artery Disease    eval for CABG...2D ECHO 06/26/16, MYOCARDIAL PERFUSSION STRESS and REST 06/26/16, CATH 07/10/16    History of Present Illness:    [name] [name] 80 y.o. male is seen in the office  today for consideration of CABG. He notes increasing fatigue with exertion over past 3-4 months. He sought 2nd opinion from [name] [name] [name] concerning his known CAD, ischemic cardiomyopathy. Myoview stress test and cardiac done. Previous cath done 2013.  Class II, Stage C symptoms of fatigue and SOB, but with significant activity level. Patient noted several days ago cutting down tree and sawing wood and stacking wood.   A high probability stress test with risk factors including high blood pressure and high cholesterol.     Patient has previous history of: 06/19/2012  OPERATIVE REPORT  PREOPERATIVE DIAGNOSIS: Left upper lobe lung mass.  POSTOPERATIVE DIAGNOSES: Left upper lobe lung mass, squamous cell  carcinoma, non-small cell carcinoma by frozen section.  PROCEDURE PERFORMED: Video bronchoscopy, left video-assisted  thoracoscopy with mini thoracotomy, resection of left upper lobe and  portion of superior segment left lower lobe and lymph node dissection.   Stage IB, (pT2a,pN0,cM0 SQUAMOUS CELL CARCINOMA, 3.8 CM.)  Current Activity/ Functional Status:  Patient is independent with mobility/ambulation, transfers, ADL's, IADL's.   Zubrod Score: At the time of surgery this patient's most appropriate activity status/level should be described as: '[]'$     0    Normal activity,  no symptoms '[]'$     1    Restricted in physical strenuous activity but ambulatory, able to do out light work '[]'$     2    Ambulatory and capable of self care, unable to do work activities, up and about               >50 % of waking hours                              '[]'$     3    Only limited self care, in bed greater than 50% of waking hours '[]'$     4    Completely disabled, no self care, confined to bed or chair '[]'$     5    Moribund   Past Medical History  Diagnosis Date  . Lung mass     Stage 1B non-small cell lung CA s/p resection 2013  . BPH (benign prostatic hyperplasia)     [name] [name]/elevated PSA, prostate biopsy x4 including one saturation biopsy, laser treatment 2/14; NOCTURIA  . Shingles   . Heart murmur     as a child  . Chronic kidney disease     kidney stones -small passed.  . Trigger finger     Bilateral  . PVC (premature ventricular contraction)   . Cardiomyopathy ([place])     dilated cardiomyopathy EF 30%, MUGA  EF 42% 08/2012  .  Sigmoid diverticulosis   . Carotid artery occlusion     carotid artery bruit  . Hypertension   . Hyperlipidemia     statin intolerant  . CAD (coronary artery disease) 2013    cath 05/2012 showing 80-90% stenosis of a trifurcating diagonal #1, 70-80% stenosis of OM3 and 90% stenosis of distal LCx after OM3 - medical management, [place]  . Diastolic dysfunction   . Arthritis     JOINT PAIN RIGHT HAND  . Squamous cell carcinoma, face     history of right face with mets to right upper cheek in 2004  and facial lymph node reoccurence post surgery with XRT  . Skin cancer     Multiple skin cancers     Past Surgical History  Procedure Laterality Date  . Mohs surgery  2004  . Excision ot metastatic lymph node followed by radiation  2006    .  Marland Kitchen Cataract extraction, bilateral    . Appendectomy    . Cardiac catheterization  05/2012    Has blockage- Treated with Medications.  Cardiologist - [name] [name]  . Video bronchoscopy  06/16/2012    Procedure: VIDEO  BRONCHOSCOPY;  Surgeon: [name] Isaac, MD;  Location: [facility];  Service: Thoracic;  Laterality: N/A;  . Lymph node dissection  06/16/2012    Procedure: LYMPH NODE DISSECTION;  Surgeon: [name] Isaac, MD;  Location: [facility] [facility];  Service: Thoracic;;  . Video bronchoscopy  06/24/2012    Procedure: VIDEO BRONCHOSCOPY;  Surgeon: [name] Isaac, MD;  Location: [facility];  Service: Thoracic;  Laterality: N/A;  . Green light laser turp (transurethral resection of prostate N/A 02/08/2013    Procedure: GREEN LIGHT LASER TURP (TRANSURETHRAL RESECTION OF PROSTATE;  Surgeon: [name] Rud, MD;  Location: [facility];  Service: Urology;  Laterality: N/A;  . Appendectomy    . Transurethral resection of prostate Left 08/22/2014    Procedure: TRANSURETHRAL RESECTION OF THE PROSTATE (TURP) CYSTOSCOPY, LEFT RETROGRADE PYLEGRAM,LEFT with STENT,URETEROSCOPY,PLACEMENT OF BACKSTOP,LASER FRAGMENTATION WITH BASKET EXTRACTION LEFT URETEROSCOPY;  Surgeon: [name] Rud, MD;  Location: [facility];  Service: Urology;  Laterality: Left;  . Holmium laser application Left 06/17/5092    Procedure: HOLMIUM LASER APPLICATION;  Surgeon: [name] Rud, MD;  Location: [facility];  Service: Urology;  Laterality: Left;  . Cardiac catheterization Left 07/10/2016    Procedure: Left Heart Cath and Coronary Angiography;  Surgeon: [name] Skains, MD;  Location: [facility];  Service: Cardiovascular;  Laterality: Left;    Family History  Problem Relation Age of Onset  . Diabetes Father   . Psoriasis Child   . Heart disease Child     resuscitated from cardiac arrest from Brugada's syndrome  . Lung cancer Mother   . Heart disease Mother     Social History   Social History  . Marital Status: Married    Spouse Name: N/A  . Number of Children: N/A  . Years of Education: N/A   Occupational History  . retired   Social History Main Topics  . Smoking status: Former Smoker    Types: Cigarettes     Quit date: 12/30/1965  . Smokeless tobacco: Never Used  . Alcohol Use: 3.0 - 3.6 oz/week    3-4 Glasses of wine, 2 Cans of beer per week     Comment: infrequently- "indulge heavly when I do"  . Drug Use: No  . Sexual Activity: Not on file  Social History Narrative    History  Smoking status  . Former Smoker  . Types: Cigarettes  . Quit date: 12/30/1965  Smokeless tobacco  . Never Used    History  Alcohol Use  . 3.0 - 3.6 oz/week  . 3-4 Glasses of wine, 2 Cans of beer per week    Comment: infrequently- "indulge heavly when I do"     Allergies  Allergen Reactions  . Lipitor [Atorvastatin] Other (See Comments)    Muscle pain  . Ramipril Cough  . Statins     cough    Current Outpatient Prescriptions  Medication Sig Dispense Refill  . aspirin 81 MG tablet Take 81 mg by mouth daily.    Marland Kitchen b complex vitamins tablet Take 1 tablet by mouth daily.    . carvedilol (COREG) 3.125 MG tablet Take 1 tablet (3.125 mg total) by mouth 2 (two) times daily. 180 tablet 3  . ezetimibe (ZETIA) 10 MG tablet Take 10 mg by mouth daily.    . Flaxseed, Linseed, (FLAXSEED OIL) 1000 MG CAPS Take by mouth.    . isosorbide mononitrate (IMDUR) 30 MG 24 hr tablet Take 30 mg by mouth daily.    . Multiple Vitamin (MULTIVITAMIN WITH MINERALS) TABS tablet Take 1 tablet by mouth daily.    . nitroGLYCERIN (NITROSTAT) 0.4 MG SL tablet Place 1 tablet under the tongue every 5 (five) minutes as needed for chest pain.   1  . Omega 3 1200 MG CAPS Take 2 capsules by mouth daily.      No current facility-administered medications for this visit.     Review of Systems:     Cardiac Review of Systems: Y or N  Chest Pain [  n  ]  Resting SOB [  n ] Exertional SOB  [ y ]  Orthopnea [ n ]   Pedal Edema [ n  ]    Palpitations [n  ] Syncope  [n  ]   Presyncope [n   ]  General Review of Systems: [Y] = yes [  ]=no Constitional: recent weight change [  ];  Wt loss over the last 3 months [   ] anorexia [  ];  fatigue [  ]; nausea [  ]; night sweats [  ]; fever [  ]; or chills [  ];          Dental: poor dentition[  ]; Last Dentist visit:   Eye : blurred vision [  ]; diplopia [   ]; vision changes [  ];  Amaurosis fugax[  ]; Resp: cough [  ];  wheezing[  ];  hemoptysis[  ]; shortness of breath[y  ]; paroxysmal nocturnal dyspnea[  ]; dyspnea on exertion[y  ]; or orthopnea[  ];  GI:  gallstones[  ], vomiting[  ];  dysphagia[  ]; melena[  ];  hematochezia [  ]; heartburn[  ];   Hx of  Colonoscopy[  ]; GU: kidney stones [  ]; hematuria[  ];   dysuria [  ];  nocturia[  ];  history of     obstruction [  ]; urinary frequency [  ]             Skin: rash, swelling[  ];, hair loss[  ];  peripheral edema[  ];  or itching[  ]; Musculosketetal: myalgias[  ];  joint swelling[  ];  joint erythema[  ];  joint pain[  ];  back pain[  ];  Heme/Lymph: bruising[  ];  bleeding[  ];  anemia[  ];  Neuro: TIA[n  ];  headaches[  ];  stroke[  ];  vertigo[  ];  seizures[  n];   paresthesias[  ];  difficulty n];  Psych:depression[  ]; anxiety[  ];  Endocrine: diabetes[  ];  thyroid dysfunction[  ];  Immunizations: Flu up to date [  ]; Pneumococcal up to date [  ];  Other:  Physical Exam: BP 124/74 mmHg  Pulse 75  Resp 16  Ht '5\' 4"'$  (1.626 m)  Wt 163 lb (73.936 kg)  BMI 27.97 kg/m2  SpO2 98%  PHYSICAL EXAMINATION: General appearance: alert, cooperative and appears stated age Head: Normocephalic, without obvious abnormality, atraumatic Neck: no adenopathy, no carotid bruit, no JVD, supple, symmetrical, trachea midline and thyroid not enlarged, symmetric, no tenderness/mass/nodules Lymph nodes: Cervical, supraclavicular, and axillary nodes normal. Resp: clear to auscultation bilaterally Back: symmetric, no curvature. ROM normal. No CVA tenderness. Cardio: regular rate and rhythm, S1, S2 normal, no murmur, click, rub or gallop GI: soft, non-tender; bowel sounds normal; no masses,  no organomegaly Extremities: extremities  normal, atraumatic, no cyanosis or edema Neurologic: Grossly normal  Diagnostic Studies & Laboratory data:     Recent Radiology Findings:  CLINICAL DATA: Subsequent encounter for left lung cancer.  EXAM: CT CHEST WITHOUT CONTRAST  TECHNIQUE: Multidetector CT imaging of the chest was performed following the standard protocol without IV contrast.  COMPARISON: 12/15/2014.  FINDINGS: Mediastinum / Lymph Nodes: There is no axillary lymphadenopathy. 8 mm short axis precarinal lymph node is unchanged. No mediastinal or hilar lymphadenopathy. Suture material is seen in the left hilum. Volume loss is again noted in the left hemi thorax. The heart size is stable. Coronary artery calcification is noted. The esophagus has normal imaging features.  Lungs / Pleura: Lungs remain clear. No focal airspace consolidation. No pulmonary edema or pleural effusion. Scarring in the left lung again noted without features suspicious or worrisome for recurrent disease.  Upper Abdomen: Nonobstructing stones seen lower pole left kidney. No adrenal nodule or mass.  MSK / Soft Tissues: Bone windows reveal no worrisome lytic or sclerotic osseous lesions.  IMPRESSION: 1. Stable exam. Status post left upper lobectomy. No evidence for recurrent disease. No new or progressive findings.   Electronically Signed  By: Misty Stanley M.D.  On: 12/06/2015 11:23  CARDIAC MRI 08/2015 CLINICAL DATA: 80 year old male with non-ischemic cardiomyopathy and new decrease of LVEF on nuclear stress test. Evaluate LVEF.  EXAM: CARDIAC MRI  TECHNIQUE: The patient was scanned on a 1.5 Tesla GE magnet. A dedicated cardiac coil was used. Functional imaging was done using Fiesta sequences. 2,3, and 4 chamber views were done to assess for RWMA's. Modified Simpson's rule using a short axis stack was used to calculate an ejection fraction on a dedicated work Conservation officer, nature. The patient  received 24 cc of Multihance. After 10 minutes inversion recovery sequences were used to assess for infiltration and scar tissue.  CONTRAST: 24 cc of Multihance  FINDINGS: 1. Mildly dilated left ventricle with mild concentric hypertrophy and moderately impaired systolic function (LVEF = 35%) with diffuse hypokinesis.  LVEDD: 60 mm  LVESD: 51 mm  LVEDV: 167 ml  LVESV: 110 ml  SV: 58 ml  CO: 3.9 L/minute  2. Normal right ventricular size, thickness and low normal systolic function (RVEF = 47%).  3. Moderate mitral and mild tricuspid regurgitation.  4. Moderate left atrial and mild atrial dilatation.  5. Dilated main pulmonary artery.  6. Normal  size of the aorta and aortic arch.  7. No late gadolinium enhancement present in the myocardium of the left and right ventricle.  8. No pericardial effusion is present.  IMPRESSION: 1. Mildly dilated left ventricle with mild concentric hypertrophy and moderately impaired systolic function (LVEF = 35%) with diffuse hypokinesis.  2. Normal right ventricular size, thickness and low normal systolic function (RVEF = 47%).  3. Moderate mitral and mild tricuspid regurgitation.  4. Moderate left atrial and mild atrial dilatation.  5. Dilated main pulmonary artery.  6. No late gadolinium enhancement present in the myocardium of the left and right ventricle.  Conclusively, these findings are consistent with dilated cardiomyopathy and moderately impaired LVEF.  Ena Dawley   Electronically Signed  By: Ena Dawley  On: 09/22/2015 14:08 \ I have independently reviewed the above radiologic studies.  Recent Lab Findings: Lab Results  Component Value Date   WBC 5.1 08/17/2014   HGB 14.0 08/17/2014   HCT 42.1 08/17/2014   PLT 179 08/17/2014   GLUCOSE 140* 08/17/2014   CHOL 224* 08/17/2015   TRIG 124.0 08/17/2015   HDL 43.80 08/17/2015   LDLDIRECT 147.6 11/08/2013    LDLCALC 155* 08/17/2015   ALT 12 08/17/2015   AST 20 11/16/2014   NA 141 08/17/2014   K 4.1 08/17/2014   CL 103 08/17/2014   CREATININE 1.01 09/21/2015   BUN 18 08/17/2014   CO2 25 08/17/2014   TSH 5.698* 11/30/2007   INR 1.21 06/24/2012   Left Heart Cath and Coronary Angiography    Conclusion     Prox RCA lesion, 70% stenosed.  3rd RPLB lesion, 100% stenosed.  Ost Cx lesion, 35% stenosed.  Dist Cx lesion, 95% stenosed.  Mid Cx lesion, 40% stenosed.  3rd Mrg lesion, 80% stenosed.  LM lesion, 75% stenosed.  Ost LAD lesion, 65% stenosed.  1st Diag lesion, 95% stenosed.  Lat 1st Diag lesion, 85% stenosed   The patient has had progressive canadian class 4 anginal symptoms with a high probability stress test with risk factors including high blood pressure and high cholesterol. With inferior and apical reversable defect reduced left ventricular function with ejection fraction of 25% severe 3 vessel coronary artery disease  There is significant stenosis of circumflex as culpret artery Continue medical management of CAD risk factors, Consider PCI and stent placement in culprit artery and Consider consultation for CABG due to multivessel cad with reduced EF vs PCI and medical mgt of culpret vessel   I have independently reviewed the above  cath films and reviewed the findings with the  patient .    Patient had a Cardiolite study dated 06/26/2016 ejection fraction 21% demonstrated hypokinesis of the posterior myocardium 1 septum normal treadmill EKG without evidence of ischemia or arrhythmia reversible apical and inferior myocardial perfusion defect consistent with myocardial ischemia was thought to be a high-risk scan read by [name]. Nehemiah [name]     Assessment / Plan:   Ischemic Cardiomyopathy- with progressive 3v cad. I have recommended to the patient proceeding with CABG after echocardiogram to assess degrees of MR  and TR notd on previous Cardiac MRI. Risks and options  discussed with the patient and wife . He is aware of increased risk of surgery with poor LV function. Plan Friday 7/21.   CT of chest follow up previous resection of left upper lobe with IB non small cell lung cancer prior to CABG   I  spent 40 minutes counseling the patient face to face and 50% or more  the  time was spent in counseling and coordination of care. The total time spent in the appointment was 60 minutes.  [name] Isaac MD      [address] [place] [address] Office [phone]   Beeper [phone]  07/15/2016 4:10 PM

## 2016-07-16 ENCOUNTER — Encounter: Payer: Self-pay | Admitting: *Deleted

## 2016-07-17 ENCOUNTER — Encounter (HOSPITAL_COMMUNITY)
Admission: RE | Admit: 2016-07-17 | Discharge: 2016-07-17 | Disposition: A | Discharge status: Home / Self Care | Payer: PPO | Source: Ambulatory Visit | Attending: Cardiothoracic Surgery | Admitting: Cardiothoracic Surgery

## 2016-07-17 ENCOUNTER — Ambulatory Visit (HOSPITAL_COMMUNITY)
Admission: RE | Admit: 2016-07-17 | Discharge: 2016-07-17 | Disposition: A | Discharge status: Home / Self Care | Payer: PPO | Source: Ambulatory Visit | Attending: Cardiothoracic Surgery | Admitting: Cardiothoracic Surgery

## 2016-07-17 ENCOUNTER — Encounter (HOSPITAL_COMMUNITY): Payer: Self-pay

## 2016-07-17 VITALS — BP 111/59 | HR 65 | Temp 97.4°F | Resp 18 | Ht 63.0 in | Wt 162.7 lb

## 2016-07-17 DIAGNOSIS — I255 Ischemic cardiomyopathy: Secondary | ICD-10-CM | POA: Diagnosis present

## 2016-07-17 DIAGNOSIS — Z951 Presence of aortocoronary bypass graft: Secondary | ICD-10-CM | POA: Diagnosis not present

## 2016-07-17 DIAGNOSIS — I5023 Acute on chronic systolic (congestive) heart failure: Secondary | ICD-10-CM | POA: Diagnosis not present

## 2016-07-17 DIAGNOSIS — Z01812 Encounter for preprocedural laboratory examination: Secondary | ICD-10-CM

## 2016-07-17 DIAGNOSIS — Z85118 Personal history of other malignant neoplasm of bronchus and lung: Secondary | ICD-10-CM | POA: Diagnosis not present

## 2016-07-17 DIAGNOSIS — N189 Chronic kidney disease, unspecified: Secondary | ICD-10-CM | POA: Diagnosis present

## 2016-07-17 DIAGNOSIS — M79641 Pain in right hand: Secondary | ICD-10-CM | POA: Diagnosis not present

## 2016-07-17 DIAGNOSIS — K5900 Constipation, unspecified: Secondary | ICD-10-CM | POA: Diagnosis not present

## 2016-07-17 DIAGNOSIS — I4891 Unspecified atrial fibrillation: Secondary | ICD-10-CM | POA: Diagnosis present

## 2016-07-17 DIAGNOSIS — Z01818 Encounter for other preprocedural examination: Secondary | ICD-10-CM | POA: Insufficient documentation

## 2016-07-17 DIAGNOSIS — I25110 Atherosclerotic heart disease of native coronary artery with unstable angina pectoris: Secondary | ICD-10-CM | POA: Diagnosis not present

## 2016-07-17 DIAGNOSIS — Z0183 Encounter for blood typing: Secondary | ICD-10-CM | POA: Insufficient documentation

## 2016-07-17 DIAGNOSIS — I2510 Atherosclerotic heart disease of native coronary artery without angina pectoris: Secondary | ICD-10-CM

## 2016-07-17 DIAGNOSIS — I6529 Occlusion and stenosis of unspecified carotid artery: Secondary | ICD-10-CM | POA: Diagnosis present

## 2016-07-17 DIAGNOSIS — Z9842 Cataract extraction status, left eye: Secondary | ICD-10-CM | POA: Diagnosis not present

## 2016-07-17 DIAGNOSIS — K5730 Diverticulosis of large intestine without perforation or abscess without bleeding: Secondary | ICD-10-CM | POA: Diagnosis not present

## 2016-07-17 DIAGNOSIS — Z452 Encounter for adjustment and management of vascular access device: Secondary | ICD-10-CM | POA: Diagnosis not present

## 2016-07-17 DIAGNOSIS — I080 Rheumatic disorders of both mitral and aortic valves: Secondary | ICD-10-CM | POA: Diagnosis not present

## 2016-07-17 DIAGNOSIS — I130 Hypertensive heart and chronic kidney disease with heart failure and stage 1 through stage 4 chronic kidney disease, or unspecified chronic kidney disease: Secondary | ICD-10-CM | POA: Diagnosis present

## 2016-07-17 DIAGNOSIS — Z87891 Personal history of nicotine dependence: Secondary | ICD-10-CM | POA: Diagnosis not present

## 2016-07-17 DIAGNOSIS — I420 Dilated cardiomyopathy: Secondary | ICD-10-CM | POA: Diagnosis present

## 2016-07-17 DIAGNOSIS — E7800 Pure hypercholesterolemia, unspecified: Secondary | ICD-10-CM | POA: Diagnosis present

## 2016-07-17 DIAGNOSIS — M1990 Unspecified osteoarthritis, unspecified site: Secondary | ICD-10-CM | POA: Diagnosis not present

## 2016-07-17 DIAGNOSIS — Z9841 Cataract extraction status, right eye: Secondary | ICD-10-CM | POA: Diagnosis not present

## 2016-07-17 DIAGNOSIS — Z79899 Other long term (current) drug therapy: Secondary | ICD-10-CM

## 2016-07-17 DIAGNOSIS — D696 Thrombocytopenia, unspecified: Secondary | ICD-10-CM | POA: Diagnosis not present

## 2016-07-17 DIAGNOSIS — Z85828 Personal history of other malignant neoplasm of skin: Secondary | ICD-10-CM

## 2016-07-17 DIAGNOSIS — I081 Rheumatic disorders of both mitral and tricuspid valves: Secondary | ICD-10-CM | POA: Diagnosis present

## 2016-07-17 DIAGNOSIS — R001 Bradycardia, unspecified: Secondary | ICD-10-CM | POA: Insufficient documentation

## 2016-07-17 DIAGNOSIS — Z4682 Encounter for fitting and adjustment of non-vascular catheter: Secondary | ICD-10-CM | POA: Diagnosis not present

## 2016-07-17 DIAGNOSIS — X58XXXA Exposure to other specified factors, initial encounter: Secondary | ICD-10-CM | POA: Diagnosis present

## 2016-07-17 DIAGNOSIS — R5383 Other fatigue: Secondary | ICD-10-CM | POA: Diagnosis not present

## 2016-07-17 DIAGNOSIS — N400 Enlarged prostate without lower urinary tract symptoms: Secondary | ICD-10-CM | POA: Diagnosis not present

## 2016-07-17 DIAGNOSIS — S2220XA Unspecified fracture of sternum, initial encounter for closed fracture: Secondary | ICD-10-CM | POA: Diagnosis present

## 2016-07-17 DIAGNOSIS — Z794 Long term (current) use of insulin: Secondary | ICD-10-CM | POA: Diagnosis not present

## 2016-07-17 DIAGNOSIS — E1122 Type 2 diabetes mellitus with diabetic chronic kidney disease: Secondary | ICD-10-CM | POA: Diagnosis not present

## 2016-07-17 DIAGNOSIS — D62 Acute posthemorrhagic anemia: Secondary | ICD-10-CM | POA: Diagnosis not present

## 2016-07-17 DIAGNOSIS — R9431 Abnormal electrocardiogram [ECG] [EKG]: Secondary | ICD-10-CM

## 2016-07-17 DIAGNOSIS — J90 Pleural effusion, not elsewhere classified: Secondary | ICD-10-CM | POA: Diagnosis not present

## 2016-07-17 DIAGNOSIS — I5022 Chronic systolic (congestive) heart failure: Secondary | ICD-10-CM | POA: Diagnosis not present

## 2016-07-17 DIAGNOSIS — N186 End stage renal disease: Secondary | ICD-10-CM | POA: Diagnosis not present

## 2016-07-17 DIAGNOSIS — I509 Heart failure, unspecified: Secondary | ICD-10-CM | POA: Diagnosis not present

## 2016-07-17 DIAGNOSIS — M799 Soft tissue disorder, unspecified: Secondary | ICD-10-CM | POA: Diagnosis not present

## 2016-07-17 HISTORY — DX: Personal history of other infectious and parasitic diseases: Z86.19

## 2016-07-17 LAB — COMPREHENSIVE METABOLIC PANEL
ALT: 20 U/L (ref 17–63)
AST: 16 U/L (ref 15–41)
Albumin: 3.7 g/dL (ref 3.5–5.0)
Alkaline Phosphatase: 63 U/L (ref 38–126)
Anion gap: 4 — ABNORMAL LOW (ref 5–15)
BUN: 25 mg/dL — ABNORMAL HIGH (ref 6–20)
CO2: 27 mmol/L (ref 22–32)
Calcium: 9.4 mg/dL (ref 8.9–10.3)
Chloride: 108 mmol/L (ref 101–111)
Creatinine, Ser: 1.02 mg/dL (ref 0.61–1.24)
GFR calc Af Amer: >60 mL/min (ref >60)
GFR calc non Af Amer: >60 mL/min (ref >60)
Glucose, Bld: 89 mg/dL (ref 65–99)
Potassium: 4.5 mmol/L (ref 3.5–5.1)
Sodium: 139 mmol/L (ref 135–145)
Total Bilirubin: 0.9 mg/dL (ref 0.3–1.2)
Total Protein: 6.5 g/dL (ref 6.5–8.1)

## 2016-07-17 LAB — BLOOD GAS, ARTERIAL
Acid-Base Excess: 1.8 mmol/L (ref 0.0–2.0)
Bicarbonate: 25.8 mEq/L — ABNORMAL HIGH (ref 20.0–24.0)
Drawn by: 449841
FIO2: 0.21
O2 Saturation: 96.1 %
Patient temperature: 98.6
TCO2: 27 mmol/L (ref 0–100)
pCO2 arterial: 39.8 mmHg (ref 35.0–45.0)
pH, Arterial: 7.427 (ref 7.350–7.450)
pO2, Arterial: 86 mmHg (ref 80.0–100.0)

## 2016-07-17 LAB — PULMONARY FUNCTION TEST
DL/VA % pred: 70 %
DL/VA: 2.87 ml/min/mmHg/L
DLCO cor % pred: 52 %
DLCO cor: 11.98 ml/min/mmHg
DLCO unc % pred: 52 %
DLCO unc: 12.02 ml/min/mmHg
FEF 25-75 Post: 1.92 L/sec
FEF 25-75 Pre: 1.87 L/sec
FEF2575-%Change-Post: 2 %
FEF2575-%Pred-Post: 133 %
FEF2575-%Pred-Pre: 129 %
FEV1-%Change-Post: 0 %
FEV1-%Pred-Post: 101 %
FEV1-%Pred-Pre: 101 %
FEV1-Post: 2.13 L
FEV1-Pre: 2.11 L
FEV1FVC-%Change-Post: 2 %
FEV1FVC-%Pred-Pre: 107 %
FEV6-%Change-Post: -2 %
FEV6-%Pred-Post: 97 %
FEV6-%Pred-Pre: 99 %
FEV6-Post: 2.68 L
FEV6-Pre: 2.74 L
FEV6FVC-%Pred-Post: 108 %
FEV6FVC-%Pred-Pre: 108 %
FVC-%Change-Post: -2 %
FVC-%Pred-Post: 90 %
FVC-%Pred-Pre: 91 %
FVC-Post: 2.68 L
FVC-Pre: 2.74 L
Post FEV1/FVC ratio: 79 %
Post FEV6/FVC ratio: 100 %
Pre FEV1/FVC ratio: 77 %
Pre FEV6/FVC Ratio: 100 %
RV % pred: 73 %
RV: 1.65 L
TLC % pred: 82 %
TLC: 4.65 L

## 2016-07-17 LAB — PROTIME-INR
INR: 1.06 (ref 0.00–1.49)
Prothrombin Time: 14 seconds (ref 11.6–15.2)

## 2016-07-17 LAB — URINALYSIS, ROUTINE W REFLEX MICROSCOPIC
Bilirubin Urine: NEGATIVE
Glucose, UA: NEGATIVE mg/dL
Hgb urine dipstick: NEGATIVE
Ketones, ur: NEGATIVE mg/dL
Leukocytes, UA: NEGATIVE
Nitrite: NEGATIVE
Protein, ur: NEGATIVE mg/dL
Specific Gravity, Urine: 1.02 (ref 1.005–1.030)
pH: 6.5 (ref 5.0–8.0)

## 2016-07-17 LAB — SURGICAL PCR SCREEN
MRSA, PCR: NEGATIVE
Staphylococcus aureus: NEGATIVE

## 2016-07-17 LAB — CBC
HCT: 43.8 % (ref 39.0–52.0)
Hemoglobin: 14.7 g/dL (ref 13.0–17.0)
MCH: 31.1 pg (ref 26.0–34.0)
MCHC: 33.6 g/dL (ref 30.0–36.0)
MCV: 92.8 fL (ref 78.0–100.0)
Platelets: 175 10*3/uL (ref 150–400)
RBC: 4.72 MIL/uL (ref 4.22–5.81)
RDW: 12.6 % (ref 11.5–15.5)
WBC: 6.4 10*3/uL (ref 4.0–10.5)

## 2016-07-17 LAB — TYPE AND SCREEN
ABO/RH(D): O POS
Antibody Screen: NEGATIVE

## 2016-07-17 LAB — APTT: aPTT: 29 seconds (ref 24–37)

## 2016-07-17 MED ORDER — CHLORHEXIDINE GLUCONATE 4 % EX LIQD: 30.0000 mL | CUTANEOUS | Status: DC

## 2016-07-17 MED ORDER — ALBUTEROL SULFATE (2.5 MG/3ML) 0.083% IN NEBU
2.5000 mg | INHALATION_SOLUTION | Freq: Once | RESPIRATORY_TRACT | Status: AC
  Administered 2016-07-17: 2.5 mg via RESPIRATORY_TRACT

## 2016-07-17 NOTE — Pre-Procedure Instructions (Signed)
[name] [name]  07/17/2016      [id number] [address]Phone: [phone]: [phone] [facility] [place]Phone: [phone]: [phone]   Your procedure is scheduled on Fri, July 21 @ 7:30 AM  [facility]  Call this number if you have problems the morning of surgery:  [phone]  Remember:  Do not eat food or drink liquids after midnight.  Take these medicines the morning of surgery with A SIP OF WATER Carvedilol(Coreg) and Imdur(Isosorbide)              Stop taking your Fish Oil. No Goody's,BC's,Aleve,Advil,Motrin,Ibuprofen,or any Herbal Medications.    Do not wear jewelry.  Do not wear lotions, powders, or colognes.    Men may shave face and neck.  Do not bring valuables to the hospital.  [facility] any belongings or [facility]uables.  Contacts, dentures or bridgework may not be worn into surgery.  Leave your suitcase in the car.  After surgery it may be brought to your room.  For patients admitted to the hospital, discharge time will be determined by your treatment team.  Patients discharged the day of surgery will not be allowed to drive home.    [facility] - [facility]  Before surgery, you can play an important role.  Because skin is not sterile, your skin needs to be as free of germs as possible.  You can reduce the number of germs on you skin by washing with CHG (chlorahexidine gluconate) soap before surgery.  CHG is an antiseptic cleaner which kills germs and bonds with the skin to continue killing germs even after washing.  Please DO NOT use if you have an allergy to CHG or antibacterial soaps.  If your skin becomes reddened/irritated stop using the CHG and inform your  nurse when you arrive at Short Stay.  Do not shave (including legs and underarms) for at least 48 hours prior to the first CHG shower.  You may shave your face.  Please follow these instructions carefully:   1.  Shower with CHG Soap the night before surgery and the                                morning of Surgery.  2.  If you choose to wash your hair, wash your hair first as usual with your       normal shampoo.  3.  After you shampoo, rinse your hair and body thoroughly to remove the                      Shampoo.  4.  Use CHG as you would any other liquid soap.  You can apply chg directly       to the skin and wash gently with scrungie or a clean washcloth.  5.  Apply the CHG Soap to your body ONLY FROM THE NECK DOWN.        Do not use on open wounds or open sores.  Avoid contact with your eyes,       ears, mouth and genitals (private parts).  Wash genitals (private parts)  with your normal soap.  6.  Wash thoroughly, paying special attention to the area where your surgery        will be performed.  7.  Thoroughly rinse your body with warm water from the neck down.  8.  DO NOT shower/wash with your normal soap after using and rinsing off       the CHG Soap.  9.  Pat yourself dry with a clean towel.            10.  Wear clean pajamas.            11.  Place clean sheets on your bed the night of your first shower and do not        sleep with pets.  Day of Surgery  Do not apply any lotions/deoderants the morning of surgery.  Please wear clean clothes to the hospital/surgery center.      Please read over the following fact sheets that you were given. Pain Booklet, Coughing and Deep Breathing and MRSA Information

## 2016-07-17 NOTE — Progress Notes (Addendum)
Dr.Kowalski is cardiologist with last visit under care everywhere  Echo report in epic from 09/15/12 and 06/26/16  Stress test report in epic from 08/30/15 and 06/26/16  Heart cath report in epic from 07/10/16  Medical Md is Dr.John Laurann [place]

## 2016-07-18 ENCOUNTER — Ambulatory Visit
Admission: RE | Admit: 2016-07-18 | Discharge: 2016-07-18 | Disposition: A | Discharge status: Home / Self Care | Payer: PPO | Source: Ambulatory Visit | Attending: Cardiothoracic Surgery | Admitting: Cardiothoracic Surgery

## 2016-07-18 ENCOUNTER — Ambulatory Visit (HOSPITAL_COMMUNITY): Admission: RE | Admit: 2016-07-18 | Payer: PPO | Source: Ambulatory Visit

## 2016-07-18 ENCOUNTER — Ambulatory Visit (HOSPITAL_BASED_OUTPATIENT_CLINIC_OR_DEPARTMENT_OTHER)
Admission: RE | Admit: 2016-07-18 | Discharge: 2016-07-18 | Disposition: A | Discharge status: Home / Self Care | Payer: PPO | Source: Ambulatory Visit | Attending: Cardiothoracic Surgery | Admitting: Cardiothoracic Surgery

## 2016-07-18 DIAGNOSIS — Z01818 Encounter for other preprocedural examination: Secondary | ICD-10-CM | POA: Diagnosis not present

## 2016-07-18 DIAGNOSIS — I6523 Occlusion and stenosis of bilateral carotid arteries: Secondary | ICD-10-CM

## 2016-07-18 DIAGNOSIS — Z08 Encounter for follow-up examination after completed treatment for malignant neoplasm: Secondary | ICD-10-CM

## 2016-07-18 DIAGNOSIS — Z85118 Personal history of other malignant neoplasm of bronchus and lung: Principal | ICD-10-CM

## 2016-07-18 DIAGNOSIS — I2510 Atherosclerotic heart disease of native coronary artery without angina pectoris: Secondary | ICD-10-CM | POA: Diagnosis not present

## 2016-07-18 LAB — VAS US DOPPLER PRE CABG
LEFT ECA DIAS: -9 cm/s
LEFT VERTEBRAL DIAS: 24 cm/s
Left CCA dist dias: -10 cm/s
Left CCA dist sys: -72 cm/s
Left CCA prox dias: 12 cm/s
Left CCA prox sys: 76 cm/s
Left ICA dist dias: -26 cm/s
Left ICA dist sys: -77 cm/s
Left ICA prox dias: -24 cm/s
Left ICA prox sys: -71 cm/s
RIGHT ECA DIAS: -15 cm/s
RIGHT VERTEBRAL DIAS: 3 cm/s
Right CCA prox dias: 11 cm/s
Right CCA prox sys: 55 cm/s
Right cca dist sys: -88 cm/s

## 2016-07-18 LAB — HEMOGLOBIN A1C
Hgb A1c MFr Bld: 5.8 % — ABNORMAL HIGH (ref 4.8–5.6)
Mean Plasma Glucose: 120 mg/dL

## 2016-07-18 MED ORDER — SODIUM CHLORIDE 0.9 % IV SOLN
INTRAVENOUS | Status: AC
  Administered 2016-07-19: 69.8 mL/h via INTRAVENOUS
  Administered 2016-07-19: 13:00:00 via INTRAVENOUS
  Filled 2016-07-18: qty 40

## 2016-07-18 MED ORDER — DEXMEDETOMIDINE HCL IN NACL 400 MCG/100ML IV SOLN
0.1000 ug/kg/h | INTRAVENOUS | Status: AC
  Administered 2016-07-19: .3 ug/kg/h via INTRAVENOUS
  Filled 2016-07-18: qty 100

## 2016-07-18 MED ORDER — MAGNESIUM SULFATE 50 % IJ SOLN
40.0000 meq | INTRAMUSCULAR | Status: DC
  Filled 2016-07-18: qty 10

## 2016-07-18 MED ORDER — SODIUM CHLORIDE 0.9 % IV SOLN
INTRAVENOUS | Status: AC
  Administered 2016-07-19: .9 [IU]/h via INTRAVENOUS
  Filled 2016-07-18: qty 2.5

## 2016-07-18 MED ORDER — POTASSIUM CHLORIDE 2 MEQ/ML IV SOLN
80.0000 meq | INTRAVENOUS | Status: DC
  Filled 2016-07-18: qty 40

## 2016-07-18 MED ORDER — DOPAMINE-DEXTROSE 3.2-5 MG/ML-% IV SOLN
0.0000 ug/kg/min | INTRAVENOUS | Status: AC
  Administered 2016-07-19: 3 ug/kg/min via INTRAVENOUS
  Filled 2016-07-18: qty 250

## 2016-07-18 MED ORDER — DEXTROSE 5 % IV SOLN
1.5000 g | INTRAVENOUS | Status: AC
  Administered 2016-07-19: 1.5 g via INTRAVENOUS
  Administered 2016-07-19: .75 g via INTRAVENOUS
  Filled 2016-07-18 (×2): qty 1.5

## 2016-07-18 MED ORDER — NITROGLYCERIN IN D5W 200-5 MCG/ML-% IV SOLN
2.0000 ug/min | INTRAVENOUS | Status: DC
  Filled 2016-07-18: qty 250

## 2016-07-18 MED ORDER — SODIUM CHLORIDE 0.9 % IV SOLN
INTRAVENOUS | Status: DC
  Filled 2016-07-18: qty 30

## 2016-07-18 MED ORDER — EPINEPHRINE HCL 1 MG/ML IJ SOLN
0.0000 ug/min | INTRAVENOUS | Status: AC
  Filled 2016-07-18: qty 4

## 2016-07-18 MED ORDER — PLASMA-LYTE 148 IV SOLN
INTRAVENOUS | Status: AC
  Administered 2016-07-19: 500 mL
  Filled 2016-07-18: qty 2.5

## 2016-07-18 MED ORDER — VANCOMYCIN HCL 10 G IV SOLR
1250.0000 mg | INTRAVENOUS | Status: AC
  Administered 2016-07-19: 1250 mg via INTRAVENOUS
  Filled 2016-07-18: qty 1250

## 2016-07-18 MED ORDER — PHENYLEPHRINE HCL 10 MG/ML IJ SOLN
30.0000 ug/min | INTRAVENOUS | Status: AC
  Administered 2016-07-19: 15 ug/min via INTRAVENOUS
  Filled 2016-07-18: qty 2

## 2016-07-18 MED ORDER — DEXTROSE 5 % IV SOLN
750.0000 mg | INTRAVENOUS | Status: DC
  Filled 2016-07-18: qty 750

## 2016-07-18 NOTE — Progress Notes (Addendum)
Pre-op Cardiac Surgery  Carotid Findings:   There is evidence of 1-39% internal carotid artery stenosis bilaterally. Vertebral arteries are patent with antegrade flow.  Upper Extremity Right Left  Brachial Pressures 100-Triphasic 107-Triphasic  Radial Waveforms Triphasic Triphasic  Ulnar Waveforms Triphasic Triphasic  Palmar Arch (Allen's Test) Within normal limits. Signal is unaffected with radial compression, obliterates with ulnar compression.    Lower  Extremity Right Left  Dorsalis Pedis Triphasic Triphasic  Posterior Tibial Triphasic Triphasic   07/18/2016 1:43 PM Maudry Mayhew, RVT, RDCS, RDMS

## 2016-07-18 NOTE — Anesthesia Preprocedure Evaluation (Addendum)
Anesthesia Evaluation  Patient identified by MRN, date of birth, ID band Patient awake    Reviewed: Allergy & Precautions, NPO status , Patient's Chart, lab work & pertinent test results  History of Anesthesia Complications Negative for: history of anesthetic complications  Airway Mallampati: III  TM Distance: <3 FB Neck ROM: Full    Dental  (+) Dental Advisory Given   Pulmonary COPD, former smoker,  Non-small cell lung Ca resected 2013: LUL lobectomy   breath sounds clear to auscultation       Cardiovascular hypertension, Pt. on medications and Pt. on home beta blockers + angina with exertion + CAD and + Peripheral Vascular Disease   Rhythm:Regular Rate:Normal + Systolic murmurs Cath: multivessel Dz, EF 25%   Neuro/Psych negative neurological ROS     GI/Hepatic negative GI ROS, Neg liver ROS,   Endo/Other  negative endocrine ROS  Renal/GU negative Renal ROS     Musculoskeletal  (+) Arthritis , Osteoarthritis,    Abdominal   Peds  Hematology negative hematology ROS (+)   Anesthesia Other Findings   Reproductive/Obstetrics                          Anesthesia Physical Anesthesia Plan  ASA: IV  Anesthesia Plan: General   Post-op Pain Management:    Induction: Intravenous  Airway Management Planned: Oral ETT  Additional Equipment: Arterial line, CVP, PA Cath, TEE and Ultrasound Guidance Line Placement  Intra-op Plan:   Post-operative Plan: Post-operative intubation/ventilation  Informed Consent: I have reviewed the patients History and Physical, chart, labs and discussed the procedure including the risks, benefits and alternatives for the proposed anesthesia with the patient or authorized representative who has indicated his/her understanding and acceptance.   Dental advisory given  Plan Discussed with: CRNA and Surgeon  Anesthesia Plan Comments: (Plan routine monitors, A line,  PA cath, GETA with TEE and post op ventilation)        Anesthesia Quick Evaluation

## 2016-07-19 ENCOUNTER — Inpatient Hospital Stay (HOSPITAL_COMMUNITY): Payer: PPO | Admitting: Anesthesiology

## 2016-07-19 ENCOUNTER — Inpatient Hospital Stay (HOSPITAL_COMMUNITY): Payer: PPO

## 2016-07-19 ENCOUNTER — Encounter (HOSPITAL_COMMUNITY): Payer: Self-pay | Admitting: Certified Registered Nurse Anesthetist

## 2016-07-19 ENCOUNTER — Inpatient Hospital Stay (HOSPITAL_COMMUNITY)
Admission: RE | Admit: 2016-07-19 | Discharge: 2016-07-29 | DRG: 235 | Disposition: A | Discharge status: Home / Self Care | Payer: PPO | Source: Ambulatory Visit | Attending: Cardiothoracic Surgery | Admitting: Cardiothoracic Surgery

## 2016-07-19 ENCOUNTER — Encounter (HOSPITAL_COMMUNITY)
Admission: RE | Disposition: A | Discharge status: Home / Self Care | Payer: Self-pay | Source: Ambulatory Visit | Attending: Cardiothoracic Surgery

## 2016-07-19 DIAGNOSIS — I2510 Atherosclerotic heart disease of native coronary artery without angina pectoris: Secondary | ICD-10-CM

## 2016-07-19 DIAGNOSIS — M799 Soft tissue disorder, unspecified: Secondary | ICD-10-CM | POA: Diagnosis not present

## 2016-07-19 DIAGNOSIS — I10 Essential (primary) hypertension: Secondary | ICD-10-CM

## 2016-07-19 DIAGNOSIS — E1122 Type 2 diabetes mellitus with diabetic chronic kidney disease: Secondary | ICD-10-CM | POA: Diagnosis not present

## 2016-07-19 DIAGNOSIS — X58XXXA Exposure to other specified factors, initial encounter: Secondary | ICD-10-CM | POA: Diagnosis not present

## 2016-07-19 DIAGNOSIS — R5383 Other fatigue: Secondary | ICD-10-CM | POA: Diagnosis not present

## 2016-07-19 DIAGNOSIS — Z09 Encounter for follow-up examination after completed treatment for conditions other than malignant neoplasm: Secondary | ICD-10-CM

## 2016-07-19 DIAGNOSIS — I255 Ischemic cardiomyopathy: Secondary | ICD-10-CM | POA: Diagnosis not present

## 2016-07-19 DIAGNOSIS — I4891 Unspecified atrial fibrillation: Secondary | ICD-10-CM | POA: Diagnosis not present

## 2016-07-19 DIAGNOSIS — N189 Chronic kidney disease, unspecified: Secondary | ICD-10-CM | POA: Diagnosis not present

## 2016-07-19 DIAGNOSIS — I5023 Acute on chronic systolic (congestive) heart failure: Secondary | ICD-10-CM | POA: Diagnosis not present

## 2016-07-19 DIAGNOSIS — E7800 Pure hypercholesterolemia, unspecified: Secondary | ICD-10-CM | POA: Diagnosis not present

## 2016-07-19 DIAGNOSIS — Z951 Presence of aortocoronary bypass graft: Secondary | ICD-10-CM | POA: Diagnosis not present

## 2016-07-19 DIAGNOSIS — K5900 Constipation, unspecified: Secondary | ICD-10-CM | POA: Diagnosis not present

## 2016-07-19 DIAGNOSIS — I081 Rheumatic disorders of both mitral and tricuspid valves: Secondary | ICD-10-CM | POA: Diagnosis not present

## 2016-07-19 DIAGNOSIS — D62 Acute posthemorrhagic anemia: Secondary | ICD-10-CM | POA: Diagnosis not present

## 2016-07-19 DIAGNOSIS — I5022 Chronic systolic (congestive) heart failure: Secondary | ICD-10-CM | POA: Diagnosis not present

## 2016-07-19 DIAGNOSIS — I6529 Occlusion and stenosis of unspecified carotid artery: Secondary | ICD-10-CM | POA: Diagnosis not present

## 2016-07-19 DIAGNOSIS — Z452 Encounter for adjustment and management of vascular access device: Secondary | ICD-10-CM | POA: Diagnosis not present

## 2016-07-19 DIAGNOSIS — N186 End stage renal disease: Secondary | ICD-10-CM | POA: Diagnosis not present

## 2016-07-19 DIAGNOSIS — Z85118 Personal history of other malignant neoplasm of bronchus and lung: Secondary | ICD-10-CM | POA: Diagnosis not present

## 2016-07-19 DIAGNOSIS — Z79899 Other long term (current) drug therapy: Secondary | ICD-10-CM | POA: Diagnosis not present

## 2016-07-19 DIAGNOSIS — Z9842 Cataract extraction status, left eye: Secondary | ICD-10-CM | POA: Diagnosis not present

## 2016-07-19 DIAGNOSIS — Z9689 Presence of other specified functional implants: Secondary | ICD-10-CM

## 2016-07-19 DIAGNOSIS — Z87891 Personal history of nicotine dependence: Secondary | ICD-10-CM | POA: Diagnosis not present

## 2016-07-19 DIAGNOSIS — Z794 Long term (current) use of insulin: Secondary | ICD-10-CM | POA: Diagnosis not present

## 2016-07-19 DIAGNOSIS — I5189 Other ill-defined heart diseases: Secondary | ICD-10-CM

## 2016-07-19 DIAGNOSIS — Z4682 Encounter for fitting and adjustment of non-vascular catheter: Secondary | ICD-10-CM | POA: Diagnosis not present

## 2016-07-19 DIAGNOSIS — M1990 Unspecified osteoarthritis, unspecified site: Secondary | ICD-10-CM | POA: Diagnosis not present

## 2016-07-19 DIAGNOSIS — I080 Rheumatic disorders of both mitral and aortic valves: Secondary | ICD-10-CM | POA: Diagnosis not present

## 2016-07-19 DIAGNOSIS — Z9841 Cataract extraction status, right eye: Secondary | ICD-10-CM | POA: Diagnosis not present

## 2016-07-19 DIAGNOSIS — I25110 Atherosclerotic heart disease of native coronary artery with unstable angina pectoris: Secondary | ICD-10-CM | POA: Diagnosis not present

## 2016-07-19 DIAGNOSIS — I2583 Coronary atherosclerosis due to lipid rich plaque: Secondary | ICD-10-CM

## 2016-07-19 DIAGNOSIS — D696 Thrombocytopenia, unspecified: Secondary | ICD-10-CM | POA: Diagnosis not present

## 2016-07-19 DIAGNOSIS — I420 Dilated cardiomyopathy: Secondary | ICD-10-CM | POA: Diagnosis not present

## 2016-07-19 DIAGNOSIS — J90 Pleural effusion, not elsewhere classified: Secondary | ICD-10-CM | POA: Diagnosis not present

## 2016-07-19 DIAGNOSIS — I130 Hypertensive heart and chronic kidney disease with heart failure and stage 1 through stage 4 chronic kidney disease, or unspecified chronic kidney disease: Secondary | ICD-10-CM | POA: Diagnosis not present

## 2016-07-19 DIAGNOSIS — N400 Enlarged prostate without lower urinary tract symptoms: Secondary | ICD-10-CM | POA: Diagnosis not present

## 2016-07-19 DIAGNOSIS — Z01818 Encounter for other preprocedural examination: Secondary | ICD-10-CM | POA: Diagnosis not present

## 2016-07-19 DIAGNOSIS — I509 Heart failure, unspecified: Secondary | ICD-10-CM | POA: Diagnosis not present

## 2016-07-19 DIAGNOSIS — K5730 Diverticulosis of large intestine without perforation or abscess without bleeding: Secondary | ICD-10-CM | POA: Diagnosis not present

## 2016-07-19 DIAGNOSIS — Z85828 Personal history of other malignant neoplasm of skin: Secondary | ICD-10-CM | POA: Diagnosis not present

## 2016-07-19 DIAGNOSIS — S2220XA Unspecified fracture of sternum, initial encounter for closed fracture: Secondary | ICD-10-CM | POA: Diagnosis not present

## 2016-07-19 HISTORY — PX: TEE WITHOUT CARDIOVERSION: SHX5443

## 2016-07-19 HISTORY — PX: CORONARY ARTERY BYPASS GRAFT: SHX141

## 2016-07-19 HISTORY — PX: RIB PLATING: SHX5079

## 2016-07-19 LAB — POCT I-STAT, CHEM 8
BUN: 26 mg/dL — ABNORMAL HIGH (ref 6–20)
BUN: 24 mg/dL — ABNORMAL HIGH (ref 6–20)
BUN: 22 mg/dL — ABNORMAL HIGH (ref 6–20)
BUN: 21 mg/dL — ABNORMAL HIGH (ref 6–20)
BUN: 20 mg/dL (ref 6–20)
BUN: 15 mg/dL (ref 6–20)
Calcium, Ion: 1.24 mmol/L — ABNORMAL HIGH (ref 1.12–1.23)
Calcium, Ion: 1.22 mmol/L (ref 1.12–1.23)
Calcium, Ion: 0.97 mmol/L — ABNORMAL LOW (ref 1.12–1.23)
Calcium, Ion: 1 mmol/L — ABNORMAL LOW (ref 1.12–1.23)
Calcium, Ion: 1.01 mmol/L — ABNORMAL LOW (ref 1.12–1.23)
Calcium, Ion: 1.03 mmol/L — ABNORMAL LOW (ref 1.12–1.23)
Chloride: 103 mmol/L (ref 101–111)
Chloride: 105 mmol/L (ref 101–111)
Chloride: 101 mmol/L (ref 101–111)
Chloride: 100 mmol/L — ABNORMAL LOW (ref 101–111)
Chloride: 101 mmol/L (ref 101–111)
Chloride: 105 mmol/L (ref 101–111)
Creatinine, Ser: 0.7 mg/dL (ref 0.61–1.24)
Creatinine, Ser: 0.7 mg/dL (ref 0.61–1.24)
Creatinine, Ser: 0.6 mg/dL — ABNORMAL LOW (ref 0.61–1.24)
Creatinine, Ser: 0.6 mg/dL — ABNORMAL LOW (ref 0.61–1.24)
Creatinine, Ser: 0.6 mg/dL — ABNORMAL LOW (ref 0.61–1.24)
Creatinine, Ser: 0.8 mg/dL (ref 0.61–1.24)
Glucose, Bld: 105 mg/dL — ABNORMAL HIGH (ref 65–99)
Glucose, Bld: 129 mg/dL — ABNORMAL HIGH (ref 65–99)
Glucose, Bld: 108 mg/dL — ABNORMAL HIGH (ref 65–99)
Glucose, Bld: 156 mg/dL — ABNORMAL HIGH (ref 65–99)
Glucose, Bld: 154 mg/dL — ABNORMAL HIGH (ref 65–99)
Glucose, Bld: 126 mg/dL — ABNORMAL HIGH (ref 65–99)
HCT: 37 % — ABNORMAL LOW (ref 39.0–52.0)
HCT: 38 % — ABNORMAL LOW (ref 39.0–52.0)
HCT: 29 % — ABNORMAL LOW (ref 39.0–52.0)
HCT: 29 % — ABNORMAL LOW (ref 39.0–52.0)
HCT: 30 % — ABNORMAL LOW (ref 39.0–52.0)
HCT: 27 % — ABNORMAL LOW (ref 39.0–52.0)
Hemoglobin: 12.6 g/dL — ABNORMAL LOW (ref 13.0–17.0)
Hemoglobin: 12.9 g/dL — ABNORMAL LOW (ref 13.0–17.0)
Hemoglobin: 9.9 g/dL — ABNORMAL LOW (ref 13.0–17.0)
Hemoglobin: 9.9 g/dL — ABNORMAL LOW (ref 13.0–17.0)
Hemoglobin: 10.2 g/dL — ABNORMAL LOW (ref 13.0–17.0)
Hemoglobin: 9.2 g/dL — ABNORMAL LOW (ref 13.0–17.0)
Potassium: 3.9 mmol/L (ref 3.5–5.1)
Potassium: 4.4 mmol/L (ref 3.5–5.1)
Potassium: 4.2 mmol/L (ref 3.5–5.1)
Potassium: 4.3 mmol/L (ref 3.5–5.1)
Potassium: 3.7 mmol/L (ref 3.5–5.1)
Potassium: 3.7 mmol/L (ref 3.5–5.1)
Sodium: 141 mmol/L (ref 135–145)
Sodium: 139 mmol/L (ref 135–145)
Sodium: 141 mmol/L (ref 135–145)
Sodium: 138 mmol/L (ref 135–145)
Sodium: 140 mmol/L (ref 135–145)
Sodium: 141 mmol/L (ref 135–145)
TCO2: 29 mmol/L (ref 0–100)
TCO2: 29 mmol/L (ref 0–100)
TCO2: 30 mmol/L (ref 0–100)
TCO2: 27 mmol/L (ref 0–100)
TCO2: 29 mmol/L (ref 0–100)
TCO2: 23 mmol/L (ref 0–100)

## 2016-07-19 LAB — POCT I-STAT 3, ART BLOOD GAS (G3+)
Acid-Base Excess: 3 mmol/L — ABNORMAL HIGH (ref 0.0–2.0)
Acid-base deficit: 3 mmol/L — ABNORMAL HIGH (ref 0.0–2.0)
Acid-base deficit: 1 mmol/L (ref 0.0–2.0)
Acid-base deficit: 3 mmol/L — ABNORMAL HIGH (ref 0.0–2.0)
Bicarbonate: 28 mEq/L — ABNORMAL HIGH (ref 20.0–24.0)
Bicarbonate: 22.8 mEq/L (ref 20.0–24.0)
Bicarbonate: 21.5 mEq/L (ref 20.0–24.0)
Bicarbonate: 23 mEq/L (ref 20.0–24.0)
O2 Saturation: 100 %
O2 Saturation: 99 %
O2 Saturation: 100 %
O2 Saturation: 98 %
Patient temperature: 35.6
Patient temperature: 36.9
Patient temperature: 37.1
TCO2: 29 mmol/L (ref 0–100)
TCO2: 24 mmol/L (ref 0–100)
TCO2: 22 mmol/L (ref 0–100)
TCO2: 24 mmol/L (ref 0–100)
pCO2 arterial: 43.8 mmHg (ref 35.0–45.0)
pCO2 arterial: 40.2 mmHg (ref 35.0–45.0)
pCO2 arterial: 27.3 mmHg — ABNORMAL LOW (ref 35.0–45.0)
pCO2 arterial: 45 mmHg (ref 35.0–45.0)
pH, Arterial: 7.414 (ref 7.350–7.450)
pH, Arterial: 7.356 (ref 7.350–7.450)
pH, Arterial: 7.504 — ABNORMAL HIGH (ref 7.350–7.450)
pH, Arterial: 7.318 — ABNORMAL LOW (ref 7.350–7.450)
pO2, Arterial: 411 mmHg — ABNORMAL HIGH (ref 80.0–100.0)
pO2, Arterial: 114 mmHg — ABNORMAL HIGH (ref 80.0–100.0)
pO2, Arterial: 187 mmHg — ABNORMAL HIGH (ref 80.0–100.0)
pO2, Arterial: 119 mmHg — ABNORMAL HIGH (ref 80.0–100.0)

## 2016-07-19 LAB — HEMOGLOBIN AND HEMATOCRIT, BLOOD
HCT: 28.9 % — ABNORMAL LOW (ref 39.0–52.0)
Hemoglobin: 9.9 g/dL — ABNORMAL LOW (ref 13.0–17.0)

## 2016-07-19 LAB — CBC
HCT: 33.4 % — ABNORMAL LOW (ref 39.0–52.0)
HCT: 29.5 % — ABNORMAL LOW (ref 39.0–52.0)
HEMOGLOBIN: 11.2 g/dL — AB (ref 13.0–17.0)
Hemoglobin: 10.1 g/dL — ABNORMAL LOW (ref 13.0–17.0)
MCH: 30.5 pg (ref 26.0–34.0)
MCH: 31.2 pg (ref 26.0–34.0)
MCHC: 33.5 g/dL (ref 30.0–36.0)
MCHC: 34.2 g/dL (ref 30.0–36.0)
MCV: 91 fL (ref 78.0–100.0)
MCV: 91 fL (ref 78.0–100.0)
PLATELETS: 111 10*3/uL — AB (ref 150–400)
Platelets: 128 10*3/uL — ABNORMAL LOW (ref 150–400)
RBC: 3.67 MIL/uL — AB (ref 4.22–5.81)
RBC: 3.24 MIL/uL — ABNORMAL LOW (ref 4.22–5.81)
RDW: 12.2 % (ref 11.5–15.5)
RDW: 12.2 % (ref 11.5–15.5)
WBC: 13.1 10*3/uL — ABNORMAL HIGH (ref 4.0–10.5)
WBC: 14.1 10*3/uL — ABNORMAL HIGH (ref 4.0–10.5)

## 2016-07-19 LAB — GLUCOSE, CAPILLARY
Glucose-Capillary: 102 mg/dL — ABNORMAL HIGH (ref 65–99)
Glucose-Capillary: 103 mg/dL — ABNORMAL HIGH (ref 65–99)
Glucose-Capillary: 96 mg/dL (ref 65–99)

## 2016-07-19 LAB — PLATELET COUNT: Platelets: 120 10*3/uL — ABNORMAL LOW (ref 150–400)

## 2016-07-19 LAB — PROTIME-INR
INR: 1.5 — AB (ref 0.00–1.49)
PROTHROMBIN TIME: 18.2 s — AB (ref 11.6–15.2)

## 2016-07-19 LAB — CREATININE, SERUM
Creatinine, Ser: 0.8 mg/dL (ref 0.61–1.24)
GFR calc Af Amer: >60 mL/min (ref >60)
GFR calc non Af Amer: >60 mL/min (ref >60)

## 2016-07-19 LAB — POCT I-STAT 4, (NA,K, GLUC, HGB,HCT)
Glucose, Bld: 113 mg/dL — ABNORMAL HIGH (ref 65–99)
HCT: 33 % — ABNORMAL LOW (ref 39.0–52.0)
Hemoglobin: 11.2 g/dL — ABNORMAL LOW (ref 13.0–17.0)
Potassium: 3.4 mmol/L — ABNORMAL LOW (ref 3.5–5.1)
Sodium: 144 mmol/L (ref 135–145)

## 2016-07-19 LAB — APTT: aPTT: 28 seconds (ref 24–37)

## 2016-07-19 LAB — MAGNESIUM: Magnesium: 2.8 mg/dL — ABNORMAL HIGH (ref 1.7–2.4)

## 2016-07-19 SURGERY — CORONARY ARTERY BYPASS GRAFTING (CABG)
Anesthesia: General | Site: Chest | Laterality: Right

## 2016-07-19 MED ORDER — LACTATED RINGERS IV SOLN
INTRAVENOUS | Status: DC | PRN
Start: 2016-07-19 — End: 2016-07-19
  Administered 2016-07-19: 07:00:00 via INTRAVENOUS

## 2016-07-19 MED ORDER — SODIUM CHLORIDE 0.9 % IV SOLN
INTRAVENOUS | Status: DC
Start: 2016-07-19 — End: 2016-07-23
  Administered 2016-07-19: 15:00:00 via INTRAVENOUS

## 2016-07-19 MED ORDER — ASPIRIN EC 325 MG PO TBEC
325.0000 mg | DELAYED_RELEASE_TABLET | Freq: Every day | ORAL | Status: DC
  Administered 2016-07-20 – 2016-07-22 (×3): 325 mg via ORAL
  Filled 2016-07-19 (×3): qty 1

## 2016-07-19 MED ORDER — MILRINONE LACTATE IN DEXTROSE 20-5 MG/100ML-% IV SOLN
0.3000 ug/kg/min | INTRAVENOUS | Status: DC
  Administered 2016-07-21: 0.2 ug/kg/min via INTRAVENOUS
  Filled 2016-07-19 (×3): qty 100

## 2016-07-19 MED ORDER — FAMOTIDINE IN NACL 20-0.9 MG/50ML-% IV SOLN
20.0000 mg | Freq: Two times a day (BID) | INTRAVENOUS | Status: AC
  Administered 2016-07-19: 20 mg via INTRAVENOUS

## 2016-07-19 MED ORDER — SODIUM CHLORIDE 0.9 % IJ SOLN
OROMUCOSAL | Status: DC | PRN
  Administered 2016-07-19: 09:00:00 via TOPICAL

## 2016-07-19 MED ORDER — LACTATED RINGERS IV SOLN: INTRAVENOUS | Status: DC

## 2016-07-19 MED ORDER — ONDANSETRON HCL 4 MG/2ML IJ SOLN
4.0000 mg | Freq: Four times a day (QID) | INTRAMUSCULAR | Status: DC | PRN
  Administered 2016-07-21 (×2): 4 mg via INTRAVENOUS
  Filled 2016-07-19 (×2): qty 2

## 2016-07-19 MED ORDER — DEXMEDETOMIDINE HCL IN NACL 200 MCG/50ML IV SOLN
0.0000 ug/kg/h | INTRAVENOUS | Status: DC
  Filled 2016-07-19: qty 50

## 2016-07-19 MED ORDER — SODIUM CHLORIDE 0.9 % IV SOLN
INTRAVENOUS | Status: DC
  Filled 2016-07-19: qty 40

## 2016-07-19 MED ORDER — LACTATED RINGERS IV SOLN: 500.0000 mL | Freq: Once | INTRAVENOUS | Status: DC | PRN

## 2016-07-19 MED ORDER — POTASSIUM CHLORIDE 10 MEQ/50ML IV SOLN
10.0000 meq | INTRAVENOUS | Status: AC
  Administered 2016-07-19 (×3): 10 meq via INTRAVENOUS

## 2016-07-19 MED ORDER — HEPARIN SODIUM (PORCINE) 1000 UNIT/ML IJ SOLN
INTRAMUSCULAR | Status: AC
  Filled 2016-07-19: qty 1

## 2016-07-19 MED ORDER — CHLORHEXIDINE GLUCONATE 0.12% ORAL RINSE (MEDLINE KIT)
15.0000 mL | Freq: Two times a day (BID) | OROMUCOSAL | Status: DC
  Administered 2016-07-19: 15 mL via OROMUCOSAL

## 2016-07-19 MED ORDER — TRAMADOL HCL 50 MG PO TABS: 50.0000 mg | ORAL_TABLET | ORAL | Status: DC | PRN

## 2016-07-19 MED ORDER — ROCURONIUM BROMIDE 100 MG/10ML IV SOLN
INTRAVENOUS | Status: DC | PRN
  Administered 2016-07-19: 30 mg via INTRAVENOUS
  Administered 2016-07-19: 20 mg via INTRAVENOUS
  Administered 2016-07-19: 50 mg via INTRAVENOUS
  Administered 2016-07-19: 30 mg via INTRAVENOUS
  Administered 2016-07-19: 50 mg via INTRAVENOUS
  Administered 2016-07-19: 20 mg via INTRAVENOUS

## 2016-07-19 MED ORDER — FENTANYL CITRATE (PF) 100 MCG/2ML IJ SOLN
INTRAMUSCULAR | Status: DC | PRN
  Administered 2016-07-19: 200 ug via INTRAVENOUS
  Administered 2016-07-19 (×4): 100 ug via INTRAVENOUS
  Administered 2016-07-19: 150 ug via INTRAVENOUS
  Administered 2016-07-19: 500 ug via INTRAVENOUS
  Administered 2016-07-19: 200 ug via INTRAVENOUS
  Administered 2016-07-19: 100 ug via INTRAVENOUS
  Administered 2016-07-19: 150 ug via INTRAVENOUS
  Administered 2016-07-19: 50 ug via INTRAVENOUS

## 2016-07-19 MED ORDER — ALBUMIN HUMAN 5 % IV SOLN
INTRAVENOUS | Status: DC | PRN
  Administered 2016-07-19 (×2): via INTRAVENOUS

## 2016-07-19 MED ORDER — LACTATED RINGERS IV SOLN
INTRAVENOUS | Status: DC | PRN
  Administered 2016-07-19: 07:00:00 via INTRAVENOUS

## 2016-07-19 MED ORDER — FENTANYL CITRATE (PF) 250 MCG/5ML IJ SOLN
INTRAMUSCULAR | Status: AC
  Filled 2016-07-19: qty 5

## 2016-07-19 MED ORDER — HEPARIN SODIUM (PORCINE) 1000 UNIT/ML IJ SOLN
INTRAMUSCULAR | Status: DC | PRN
  Administered 2016-07-19: 27000 [IU] via INTRAVENOUS

## 2016-07-19 MED ORDER — BISACODYL 10 MG RE SUPP: 10.0000 mg | Freq: Every day | RECTAL | Status: DC

## 2016-07-19 MED ORDER — ETOMIDATE 2 MG/ML IV SOLN
INTRAVENOUS | Status: DC | PRN
  Administered 2016-07-19: 10 mg via INTRAVENOUS

## 2016-07-19 MED ORDER — PROPOFOL 10 MG/ML IV BOLUS
INTRAVENOUS | Status: AC
  Filled 2016-07-19: qty 20

## 2016-07-19 MED ORDER — EPHEDRINE 5 MG/ML INJ
INTRAVENOUS | Status: AC
Start: 2016-07-19 — End: 2016-07-19
  Filled 2016-07-19: qty 10

## 2016-07-19 MED ORDER — DOPAMINE-DEXTROSE 3.2-5 MG/ML-% IV SOLN
0.0000 ug/kg/min | INTRAVENOUS | Status: AC
  Administered 2016-07-21: 3 ug/kg/min via INTRAVENOUS
  Filled 2016-07-19: qty 250

## 2016-07-19 MED ORDER — ANTISEPTIC ORAL RINSE SOLUTION (CORINZ)
7.0000 mL | OROMUCOSAL | Status: DC
  Administered 2016-07-19 – 2016-07-20 (×6): 7 mL via OROMUCOSAL

## 2016-07-19 MED ORDER — INSULIN REGULAR BOLUS VIA INFUSION
0.0000 [IU] | Freq: Three times a day (TID) | INTRAVENOUS | Status: DC
  Administered 2016-07-20: 4 [IU] via INTRAVENOUS
  Filled 2016-07-19: qty 10

## 2016-07-19 MED ORDER — METOPROLOL TARTRATE 25 MG/10 ML ORAL SUSPENSION: 12.5000 mg | Freq: Two times a day (BID) | ORAL | Status: DC

## 2016-07-19 MED ORDER — MIDAZOLAM HCL 5 MG/5ML IJ SOLN
INTRAMUSCULAR | Status: DC | PRN
  Administered 2016-07-19: 2 mg via INTRAVENOUS
  Administered 2016-07-19 (×2): 1 mg via INTRAVENOUS

## 2016-07-19 MED ORDER — NITROGLYCERIN IN D5W 200-5 MCG/ML-% IV SOLN: 0.0000 ug/min | INTRAVENOUS | Status: DC

## 2016-07-19 MED ORDER — MORPHINE SULFATE (PF) 2 MG/ML IV SOLN
2.0000 mg | INTRAVENOUS | Status: DC | PRN
  Administered 2016-07-19 (×2): 2 mg via INTRAVENOUS
  Administered 2016-07-20 (×2): 4 mg via INTRAVENOUS
  Filled 2016-07-19: qty 2
  Filled 2016-07-19 (×2): qty 1

## 2016-07-19 MED ORDER — SODIUM CHLORIDE 0.45 % IV SOLN
INTRAVENOUS | Status: DC | PRN
  Administered 2016-07-19: 15:00:00 via INTRAVENOUS

## 2016-07-19 MED ORDER — SODIUM CHLORIDE 0.9% FLUSH
3.0000 mL | Freq: Two times a day (BID) | INTRAVENOUS | Status: DC
  Administered 2016-07-20 – 2016-07-22 (×6): 3 mL via INTRAVENOUS

## 2016-07-19 MED ORDER — MILRINONE LACTATE IN DEXTROSE 20-5 MG/100ML-% IV SOLN
0.3750 ug/kg/min | INTRAVENOUS | Status: DC
  Filled 2016-07-19: qty 100

## 2016-07-19 MED ORDER — POTASSIUM CHLORIDE 10 MEQ/50ML IV SOLN
10.0000 meq | INTRAVENOUS | Status: AC
  Administered 2016-07-19 – 2016-07-20 (×3): 10 meq via INTRAVENOUS

## 2016-07-19 MED ORDER — OXYCODONE HCL 5 MG PO TABS
5.0000 mg | ORAL_TABLET | ORAL | Status: DC | PRN
  Administered 2016-07-19: 5 mg via ORAL
  Administered 2016-07-20 (×4): 10 mg via ORAL
  Administered 2016-07-20: 5 mg via ORAL
  Administered 2016-07-21 – 2016-07-22 (×5): 10 mg via ORAL
  Filled 2016-07-19 (×10): qty 2

## 2016-07-19 MED ORDER — MAGNESIUM SULFATE 4 GM/100ML IV SOLN
4.0000 g | Freq: Once | INTRAVENOUS | Status: AC
  Administered 2016-07-19: 4 g via INTRAVENOUS
  Filled 2016-07-19: qty 100

## 2016-07-19 MED ORDER — PROTAMINE SULFATE 10 MG/ML IV SOLN
INTRAVENOUS | Status: AC
  Filled 2016-07-19: qty 25

## 2016-07-19 MED ORDER — METOPROLOL TARTRATE 12.5 MG HALF TABLET
12.5000 mg | ORAL_TABLET | Freq: Once | ORAL | Status: AC
  Administered 2016-07-19: 12.5 mg via ORAL
  Filled 2016-07-19: qty 1

## 2016-07-19 MED ORDER — DEXTROSE 5 % IV SOLN
1.5000 g | Freq: Two times a day (BID) | INTRAVENOUS | Status: AC
  Administered 2016-07-19 – 2016-07-21 (×4): 1.5 g via INTRAVENOUS
  Filled 2016-07-19 (×4): qty 1.5

## 2016-07-19 MED ORDER — EZETIMIBE 10 MG PO TABS
10.0000 mg | ORAL_TABLET | Freq: Every day | ORAL | Status: DC
  Administered 2016-07-20 – 2016-07-29 (×10): 10 mg via ORAL
  Filled 2016-07-19 (×10): qty 1

## 2016-07-19 MED ORDER — PANTOPRAZOLE SODIUM 40 MG PO TBEC
40.0000 mg | DELAYED_RELEASE_TABLET | Freq: Every day | ORAL | Status: DC
  Administered 2016-07-21 – 2016-07-22 (×2): 40 mg via ORAL
  Filled 2016-07-19 (×2): qty 1

## 2016-07-19 MED ORDER — DEXTROSE 5 % IV SOLN
10.0000 mg | INTRAVENOUS | Status: DC | PRN
  Administered 2016-07-19: 15 ug/min via INTRAVENOUS

## 2016-07-19 MED ORDER — MIDAZOLAM HCL 2 MG/2ML IJ SOLN: 2.0000 mg | INTRAMUSCULAR | Status: DC | PRN

## 2016-07-19 MED ORDER — ASPIRIN 81 MG PO CHEW: 324.0000 mg | CHEWABLE_TABLET | Freq: Every day | ORAL | Status: DC

## 2016-07-19 MED ORDER — FENTANYL CITRATE (PF) 250 MCG/5ML IJ SOLN
INTRAMUSCULAR | Status: AC
  Filled 2016-07-19: qty 20

## 2016-07-19 MED ORDER — ROCURONIUM BROMIDE 50 MG/5ML IV SOLN
INTRAVENOUS | Status: AC
  Filled 2016-07-19: qty 4

## 2016-07-19 MED ORDER — ACETAMINOPHEN 500 MG PO TABS
1000.0000 mg | ORAL_TABLET | Freq: Four times a day (QID) | ORAL | Status: DC
  Administered 2016-07-20 – 2016-07-23 (×12): 1000 mg via ORAL
  Filled 2016-07-19 (×12): qty 2

## 2016-07-19 MED ORDER — SODIUM CHLORIDE 0.9 % IV SOLN
INTRAVENOUS | Status: DC
  Administered 2016-07-19: 18:00:00 via INTRAVENOUS
  Filled 2016-07-19: qty 2.5

## 2016-07-19 MED ORDER — MORPHINE SULFATE (PF) 2 MG/ML IV SOLN
1.0000 mg | INTRAVENOUS | Status: AC | PRN
  Filled 2016-07-19: qty 2

## 2016-07-19 MED ORDER — METOPROLOL TARTRATE 5 MG/5ML IV SOLN: 2.5000 mg | INTRAVENOUS | Status: DC | PRN

## 2016-07-19 MED ORDER — ACETAMINOPHEN 160 MG/5ML PO SOLN
650.0000 mg | Freq: Once | ORAL | Status: AC
Start: 2016-07-19 — End: 2016-07-19

## 2016-07-19 MED ORDER — PHENYLEPHRINE HCL 10 MG/ML IJ SOLN
0.0000 ug/min | INTRAVENOUS | Status: DC
  Filled 2016-07-19 (×2): qty 2

## 2016-07-19 MED ORDER — B COMPLEX-C PO TABS
1.0000 | ORAL_TABLET | Freq: Every day | ORAL | Status: DC
  Administered 2016-07-20 – 2016-07-29 (×9): 1 via ORAL
  Filled 2016-07-19 (×10): qty 1

## 2016-07-19 MED ORDER — DOCUSATE SODIUM 100 MG PO CAPS
200.0000 mg | ORAL_CAPSULE | Freq: Every day | ORAL | Status: DC
  Administered 2016-07-20 – 2016-07-22 (×3): 200 mg via ORAL
  Filled 2016-07-19 (×3): qty 2

## 2016-07-19 MED ORDER — SODIUM CHLORIDE 0.9 % IV SOLN: 250.0000 mL | INTRAVENOUS | Status: DC

## 2016-07-19 MED ORDER — ACETAMINOPHEN 650 MG RE SUPP
650.0000 mg | Freq: Once | RECTAL | Status: AC
  Administered 2016-07-19: 650 mg via RECTAL

## 2016-07-19 MED ORDER — EPINEPHRINE HCL 1 MG/ML IJ SOLN
4000.0000 ug | INTRAVENOUS | Status: DC | PRN
  Administered 2016-07-19: 3 ug/min via INTRAVENOUS

## 2016-07-19 MED ORDER — PHENYLEPHRINE 40 MCG/ML (10ML) SYRINGE FOR IV PUSH (FOR BLOOD PRESSURE SUPPORT)
PREFILLED_SYRINGE | INTRAVENOUS | Status: AC
  Filled 2016-07-19: qty 10

## 2016-07-19 MED ORDER — HEMOSTATIC AGENTS (NO CHARGE) OPTIME
TOPICAL | Status: DC | PRN
  Administered 2016-07-19: 1 via TOPICAL

## 2016-07-19 MED ORDER — MIDAZOLAM HCL 10 MG/2ML IJ SOLN
INTRAMUSCULAR | Status: AC
  Filled 2016-07-19: qty 2

## 2016-07-19 MED ORDER — ARTIFICIAL TEARS OP OINT
TOPICAL_OINTMENT | OPHTHALMIC | Status: DC | PRN
  Administered 2016-07-19: 1 via OPHTHALMIC

## 2016-07-19 MED ORDER — SODIUM CHLORIDE 0.9% FLUSH: 3.0000 mL | INTRAVENOUS | Status: DC | PRN

## 2016-07-19 MED ORDER — CHLORHEXIDINE GLUCONATE 0.12 % MT SOLN
15.0000 mL | OROMUCOSAL | Status: AC
  Administered 2016-07-19: 15 mL via OROMUCOSAL

## 2016-07-19 MED ORDER — PROTAMINE SULFATE 10 MG/ML IV SOLN
INTRAVENOUS | Status: AC
  Filled 2016-07-19: qty 5

## 2016-07-19 MED ORDER — METOPROLOL TARTRATE 12.5 MG HALF TABLET: 12.5000 mg | ORAL_TABLET | Freq: Two times a day (BID) | ORAL | Status: DC

## 2016-07-19 MED ORDER — PROTAMINE SULFATE 10 MG/ML IV SOLN
INTRAVENOUS | Status: DC | PRN
  Administered 2016-07-19: 250 mg via INTRAVENOUS

## 2016-07-19 MED ORDER — DEXMEDETOMIDINE HCL IN NACL 400 MCG/100ML IV SOLN
0.1000 ug/kg/h | INTRAVENOUS | Status: DC
  Filled 2016-07-19: qty 100

## 2016-07-19 MED ORDER — ALBUMIN HUMAN 5 % IV SOLN
250.0000 mL | INTRAVENOUS | Status: AC | PRN
  Administered 2016-07-19 (×2): 250 mL via INTRAVENOUS

## 2016-07-19 MED ORDER — MILRINONE LACTATE IN DEXTROSE 20-5 MG/100ML-% IV SOLN
INTRAVENOUS | Status: DC | PRN
  Administered 2016-07-19: 50 ug/kg/min via INTRAVENOUS

## 2016-07-19 MED ORDER — ADULT MULTIVITAMIN W/MINERALS CH
1.0000 | ORAL_TABLET | Freq: Every day | ORAL | Status: DC
  Administered 2016-07-20 – 2016-07-29 (×10): 1 via ORAL
  Filled 2016-07-19 (×9): qty 1

## 2016-07-19 MED ORDER — BISACODYL 5 MG PO TBEC
10.0000 mg | DELAYED_RELEASE_TABLET | Freq: Every day | ORAL | Status: DC
  Administered 2016-07-20 – 2016-07-22 (×3): 10 mg via ORAL
  Filled 2016-07-19 (×3): qty 2

## 2016-07-19 MED ORDER — VANCOMYCIN HCL IN DEXTROSE 1-5 GM/200ML-% IV SOLN
1000.0000 mg | Freq: Once | INTRAVENOUS | Status: AC
  Administered 2016-07-19: 1000 mg via INTRAVENOUS
  Filled 2016-07-19: qty 200

## 2016-07-19 MED ORDER — 0.9 % SODIUM CHLORIDE (POUR BTL) OPTIME
TOPICAL | Status: DC | PRN
  Administered 2016-07-19: 6000 mL

## 2016-07-19 MED ORDER — FENTANYL CITRATE (PF) 250 MCG/5ML IJ SOLN
INTRAMUSCULAR | Status: AC
  Filled 2016-07-19: qty 10

## 2016-07-19 MED ORDER — ACETAMINOPHEN 160 MG/5ML PO SOLN: 1000.0000 mg | Freq: Four times a day (QID) | ORAL | Status: DC

## 2016-07-19 MED ORDER — CHLORHEXIDINE GLUCONATE 0.12 % MT SOLN
15.0000 mL | Freq: Once | OROMUCOSAL | Status: AC
  Administered 2016-07-19: 15 mL via OROMUCOSAL
  Filled 2016-07-19: qty 15

## 2016-07-19 MED ORDER — OMEGA-3-ACID ETHYL ESTERS 1 G PO CAPS
1.0000 | ORAL_CAPSULE | Freq: Two times a day (BID) | ORAL | Status: DC
  Administered 2016-07-20 – 2016-07-29 (×19): 1 g via ORAL
  Filled 2016-07-19 (×18): qty 1

## 2016-07-19 MED FILL — Magnesium Sulfate Inj 50%: INTRAMUSCULAR | Qty: 10 | Status: AC

## 2016-07-19 MED FILL — Heparin Sodium (Porcine) Inj 1000 Unit/ML: INTRAMUSCULAR | Qty: 30 | Status: AC

## 2016-07-19 MED FILL — Potassium Chloride Inj 2 mEq/ML: INTRAVENOUS | Qty: 40 | Status: AC

## 2016-07-19 SURGICAL SUPPLY — 86 items
BAG DECANTER FOR FLEXI CONT (MISCELLANEOUS) ×4 IMPLANT
BANDAGE ACE 4X5 VEL STRL LF (GAUZE/BANDAGES/DRESSINGS) ×4 IMPLANT
BANDAGE ACE 6X5 VEL STRL LF (GAUZE/BANDAGES/DRESSINGS) ×4 IMPLANT
BANDAGE ELASTIC 4 VELCRO ST LF (GAUZE/BANDAGES/DRESSINGS) ×4 IMPLANT
BANDAGE ELASTIC 6 VELCRO ST LF (GAUZE/BANDAGES/DRESSINGS) ×4 IMPLANT
BATTERY PACK STR FOR DRIVER (MISCELLANEOUS) ×4 IMPLANT
BLADE STERNUM SYSTEM 6 (BLADE) ×4 IMPLANT
BNDG GAUZE ELAST 4 BULKY (GAUZE/BANDAGES/DRESSINGS) ×4 IMPLANT
CANISTER SUCTION 2500CC (MISCELLANEOUS) ×4 IMPLANT
CATH CPB KIT GERHARDT (MISCELLANEOUS) ×4 IMPLANT
CATH THORACIC 28FR (CATHETERS) ×4 IMPLANT
CLIP FOGARTY SPRING 6M (CLIP) ×4 IMPLANT
CONN ST 1/4X3/8  BEN (MISCELLANEOUS) ×1
CONN ST 1/4X3/8 BEN (MISCELLANEOUS) ×3 IMPLANT
CONT SPEC 4OZ CLIKSEAL STRL BL (MISCELLANEOUS) ×4 IMPLANT
COVER MAYO STAND STRL (DRAPES) ×4 IMPLANT
CRADLE DONUT ADULT HEAD (MISCELLANEOUS) ×4 IMPLANT
DERMABOND ADVANCED (GAUZE/BANDAGES/DRESSINGS) ×1
DERMABOND ADVANCED .7 DNX12 (GAUZE/BANDAGES/DRESSINGS) ×3 IMPLANT
DRAIN CHANNEL 28F RND 3/8 FF (WOUND CARE) ×8 IMPLANT
DRAPE CARDIOVASCULAR INCISE (DRAPES) ×1
DRAPE SLUSH/WARMER DISC (DRAPES) ×4 IMPLANT
DRAPE SRG 135X102X78XABS (DRAPES) ×3 IMPLANT
DRILL BIT 2.2MM W/10M STOP (BIT) ×4 IMPLANT
DRSG AQUACEL AG ADV 3.5X14 (GAUZE/BANDAGES/DRESSINGS) ×4 IMPLANT
ELECT BLADE 4.0 EZ CLEAN MEGAD (MISCELLANEOUS) ×4
ELECT REM PT RETURN 9FT ADLT (ELECTROSURGICAL) ×4
ELECTRODE BLDE 4.0 EZ CLN MEGD (MISCELLANEOUS) ×3 IMPLANT
ELECTRODE REM PT RTRN 9FT ADLT (ELECTROSURGICAL) ×3 IMPLANT
FELT TEFLON 1X6 (MISCELLANEOUS) ×4 IMPLANT
GAUZE SPONGE 4X4 12PLY STRL (GAUZE/BANDAGES/DRESSINGS) ×8 IMPLANT
GLOVE BIO SURGEON STRL SZ 6 (GLOVE) ×4 IMPLANT
GLOVE BIO SURGEON STRL SZ 6.5 (GLOVE) ×12 IMPLANT
GLOVE BIOGEL M 6.5 STRL (GLOVE) ×32 IMPLANT
GLOVE BIOGEL PI IND STRL 7.0 (GLOVE) ×12 IMPLANT
GLOVE BIOGEL PI IND STRL 8 (GLOVE) ×3 IMPLANT
GLOVE BIOGEL PI INDICATOR 7.0 (GLOVE) ×4
GLOVE BIOGEL PI INDICATOR 8 (GLOVE) ×1
GOWN STRL REUS W/ TWL LRG LVL3 (GOWN DISPOSABLE) ×24 IMPLANT
GOWN STRL REUS W/TWL LRG LVL3 (GOWN DISPOSABLE) ×8
HEMOSTAT POWDER SURGIFOAM 1G (HEMOSTASIS) ×12 IMPLANT
HEMOSTAT SURGICEL 2X14 (HEMOSTASIS) ×4 IMPLANT
INSERT FOGARTY 61MM (MISCELLANEOUS) ×4 IMPLANT
KIT BASIN OR (CUSTOM PROCEDURE TRAY) ×4 IMPLANT
KIT CATH SUCT 8FR (CATHETERS) ×4 IMPLANT
KIT ROOM TURNOVER OR (KITS) ×4 IMPLANT
KIT SUCTION CATH 14FR (SUCTIONS) ×12 IMPLANT
KIT VASOVIEW 6 PRO VH 2400 (KITS) ×4 IMPLANT
LEAD PACING MYOCARDI (MISCELLANEOUS) ×4 IMPLANT
MARKER GRAFT CORONARY BYPASS (MISCELLANEOUS) ×12 IMPLANT
NS IRRIG 1000ML POUR BTL (IV SOLUTION) ×24 IMPLANT
PACK OPEN HEART (CUSTOM PROCEDURE TRAY) ×4 IMPLANT
PAD ARMBOARD 7.5X6 YLW CONV (MISCELLANEOUS) ×8 IMPLANT
PAD ELECT DEFIB RADIOL ZOLL (MISCELLANEOUS) ×4 IMPLANT
PENCIL BUTTON HOLSTER BLD 10FT (ELECTRODE) ×4 IMPLANT
PLATE RT RIBS 8 AND 9 18 HOLE (Plate) ×4 IMPLANT
PUNCH AORTIC ROTATE  4.5MM 8IN (MISCELLANEOUS) ×4 IMPLANT
SCREW TITAN MATX RIB LOCK 10MM (Screw) ×20 IMPLANT
SET CARDIOPLEGIA MPS 5001102 (MISCELLANEOUS) ×4 IMPLANT
SPONGE LAP 18X18 X RAY DECT (DISPOSABLE) ×24 IMPLANT
SUT BONE WAX W31G (SUTURE) ×4 IMPLANT
SUT MNCRL AB 4-0 PS2 18 (SUTURE) ×4 IMPLANT
SUT PROLENE 3 0 SH1 36 (SUTURE) ×12 IMPLANT
SUT PROLENE 4 0 TF (SUTURE) ×8 IMPLANT
SUT PROLENE 6 0 C 1 30 (SUTURE) ×4 IMPLANT
SUT PROLENE 6 0 CC (SUTURE) ×20 IMPLANT
SUT PROLENE 7 0 BV1 MDA (SUTURE) ×12 IMPLANT
SUT PROLENE 8 0 BV175 6 (SUTURE) ×32 IMPLANT
SUT SILK 2 0 SH CR/8 (SUTURE) ×8 IMPLANT
SUT STEEL 6MS V (SUTURE) ×4 IMPLANT
SUT STEEL SZ 6 DBL 3X14 BALL (SUTURE) ×4 IMPLANT
SUT VIC AB 1 CTX 18 (SUTURE) ×8 IMPLANT
SUT VIC AB 2-0 CT1 27 (SUTURE) ×2
SUT VIC AB 2-0 CT1 TAPERPNT 27 (SUTURE) ×6 IMPLANT
SUTURE E-PAK OPEN HEART (SUTURE) ×4 IMPLANT
SYSTEM SAHARA CHEST DRAIN ATS (WOUND CARE) ×4 IMPLANT
TAPE CLOTH SURG 6X10 WHT LF (GAUZE/BANDAGES/DRESSINGS) ×4 IMPLANT
TAPE PAPER 2X10 WHT MICROPORE (GAUZE/BANDAGES/DRESSINGS) ×4 IMPLANT
TOWEL OR 17X24 6PK STRL BLUE (TOWEL DISPOSABLE) ×8 IMPLANT
TOWEL OR 17X26 10 PK STRL BLUE (TOWEL DISPOSABLE) ×8 IMPLANT
TRAY CATH LUMEN 1 20CM STRL (SET/KITS/TRAYS/PACK) ×4 IMPLANT
TRAY FOLEY IC TEMP SENS 14FR (CATHETERS) ×4 IMPLANT
TRAY FOLEY IC TEMP SENS 16FR (CATHETERS) IMPLANT
TUBING INSUFFLATION (TUBING) ×4 IMPLANT
UNDERPAD 30X30 INCONTINENT (UNDERPADS AND DIAPERS) ×4 IMPLANT
WATER STERILE IRR 1000ML POUR (IV SOLUTION) ×8 IMPLANT

## 2016-07-19 NOTE — Brief Op Note (Addendum)
      [address]       [place] [address]             [phone]      07/19/2016  12:20 PM  PATIENT:  [name] [name]  80 y.o. male  PRE-OPERATIVE DIAGNOSIS:  CAD  POST-OPERATIVE DIAGNOSIS:  CAD  PROCEDURE:  RIGHT FEMORAL A LINE, TRANSESOPHAGEAL ECHOCARDIOGRAM (TEE), MEDIAN STERNOTOMY for CORONARY ARTERY BYPASS GRAFTING (CABG) x 4 (LIMA to LAD, SVG to DIAGONAL, SVG to CIRCUMFLEX, SVG to PDA) with EVH from [place] and LEFT INTERNAL MAMMARY ARTERY, BIOPSY OF STERNOCLAVICULAR JOINT and platting of right Sternoclavicular joint   SURGEON:  Surgeon(s) and Role:    * Grace Isaac, MD - Primary  PHYSICIAN ASSISTANT: Lars Pinks PA-C  ANESTHESIA:   general  EBL:  Total I/O In: 1700 [I.V.:1700] Out: 1350 [Urine:1350]  DRAINS: two mediastinal tubes   COUNTS CORRECT:  YES  DICTATION: .Dragon Dictation  PLAN OF CARE: Admit to inpatient   PATIENT DISPOSITION:  ICU - intubated and hemodynamically stable.   Delay start of Pharmacological VTE agent (>24hrs) due to surgical blood loss or risk of bleeding: yes  BASELINE WEIGHT: 73 kg

## 2016-07-19 NOTE — H&P (Signed)
Patient ID: [name] [name], male   DOB: 1936-08-29, 80 y.o.   MRN: [id number]      [address]       [place] [address]             [phone]                    [facility] Medical Record #[id number] Date of Birth: [birth year]  Referring: Nehemiah Massed Primary Care: Irven Shelling, MD  Chief Complaint:    CAD   History of Present Illness:    [name] [name] 80 y.o. male is seen in the office  today for consideration of CABG. He notes increasing fatigue with exertion over past 3-4 months. He sought 2nd opinion from Dr Nehemiah Massed concerning his known CAD, ischemic cardiomyopathy. Myoview stress test and cardiac done. Previous cath done 2013.  Class II, Stage C symptoms of fatigue and SOB, but with significant activity level. Patient noted several days ago cutting down tree and sawing wood and stacking wood.   A high probability stress test with risk factors including high blood pressure and high cholesterol.     Patient has previous history of: 06/19/2012  OPERATIVE REPORT  PREOPERATIVE DIAGNOSIS: Left upper lobe lung mass.  POSTOPERATIVE DIAGNOSES: Left upper lobe lung mass, squamous cell  carcinoma, non-small cell carcinoma by frozen section.  PROCEDURE PERFORMED: Video bronchoscopy, left video-assisted  thoracoscopy with mini thoracotomy, resection of left upper lobe and  portion of superior segment left lower lobe and lymph node dissection.   Stage IB, (pT2a,pN0,cM0 SQUAMOUS CELL CARCINOMA, 3.8 CM.)  Current Activity/ Functional Status:  Patient is independent with mobility/ambulation, transfers, ADL's, IADL's.   Zubrod Score: At the time of surgery this patient's most appropriate activity status/level should be described as: '[]'$     0    Normal activity, no symptoms '[x]'$     1    Restricted in physical strenuous activity but ambulatory, able to do out light work '[]'$     2    Ambulatory and capable of self care, unable to do work  activities, up and about               >50 % of waking hours                              '[]'$     3    Only limited self care, in bed greater than 50% of waking hours '[]'$     4    Completely disabled, no self care, confined to bed or chair '[]'$     5    Moribund   Past Medical History  Diagnosis Date  . Lung mass     Stage 1B non-small cell lung CA s/p resection 2013  . BPH (benign prostatic hyperplasia)     [name]/elevated PSA, prostate biopsy x4 including one saturation biopsy, laser treatment 2/14; NOCTURIA  . Shingles   . Heart murmur     as a child  . Chronic kidney disease     kidney stones -small passed.  . Trigger finger     Bilateral  . PVC (premature ventricular contraction)   . Cardiomyopathy ([place])     dilated cardiomyopathy EF 30%, MUGA  EF 42% 08/2012  . Sigmoid diverticulosis   . Carotid artery occlusion     carotid artery bruit  . Hypertension   . Hyperlipidemia     statin intolerant  .  CAD (coronary artery disease) 2013    cath 05/2012 showing 80-90% stenosis of a trifurcating diagonal #1, 70-80% stenosis of OM3 and 90% stenosis of distal LCx after OM3 - medical management, [place]  . Diastolic dysfunction   . Arthritis     JOINT PAIN RIGHT HAND  . Squamous cell carcinoma, face     history of right face with mets to right upper cheek in 2004  and facial lymph node reoccurence post surgery with XRT  . Skin cancer     Multiple skin cancers   . History of shingles     Past Surgical History  Procedure Laterality Date  . Mohs surgery  2004  . Excision ot metastatic lymph node followed by radiation  2006    .  Marland Kitchen Cataract extraction, bilateral    . Appendectomy    . Cardiac catheterization  05/2012    Has blockage- Treated with Medications.  Cardiologist - Dr Radford Pax  . Video bronchoscopy  06/16/2012    Procedure: VIDEO BRONCHOSCOPY;  Surgeon: Grace Isaac, MD;  Location: [place] [place];  Service: Thoracic;  Laterality: N/A;  . Lymph node dissection  06/16/2012     Procedure: LYMPH NODE DISSECTION;  Surgeon: Grace Isaac, MD;  Location: [place];  Service: Thoracic;;  . Video bronchoscopy  06/24/2012    Procedure: VIDEO BRONCHOSCOPY;  Surgeon: Grace Isaac, MD;  Location: [facility] OR;  Service: Thoracic;  Laterality: N/A;  . Green light laser turp (transurethral resection of prostate N/A 02/08/2013    Procedure: GREEN LIGHT LASER TURP (TRANSURETHRAL RESECTION OF PROSTATE;  Surgeon: Ailene Rud, MD;  Location: [place];  Service: Urology;  Laterality: N/A;  . Appendectomy    . Transurethral resection of prostate Left 08/22/2014    Procedure: TRANSURETHRAL RESECTION OF THE PROSTATE (TURP) CYSTOSCOPY, LEFT RETROGRADE PYLEGRAM,LEFT with STENT,URETEROSCOPY,PLACEMENT OF BACKSTOP,LASER FRAGMENTATION WITH BASKET EXTRACTION LEFT URETEROSCOPY;  Surgeon: Ailene Rud, MD;  Location: [facility];  Service: Urology;  Laterality: Left;  . Holmium laser application Left 01/31/5426    Procedure: HOLMIUM LASER APPLICATION;  Surgeon: Ailene Rud, MD;  Location: [facility];  Service: Urology;  Laterality: Left;  . Cardiac catheterization Left 07/10/2016    Procedure: Left Heart Cath and Coronary Angiography;  Surgeon: Corey Skains, MD;  Location: [facility];  Service: Cardiovascular;  Laterality: Left;    Family History  Problem Relation Age of Onset  . Diabetes Father   . Psoriasis Child   . Heart disease Child     resuscitated from cardiac arrest from Brugada's syndrome  . Lung cancer Mother   . Heart disease Mother     Social History   Social History  . Marital Status: Married    Spouse Name: N/A  . Number of Children: N/A  . Years of Education: N/A   Occupational History  . retired   Social History Main Topics  . Smoking status: Former Smoker    Types: Cigarettes    Quit date: 12/30/1965  . Smokeless tobacco: Never Used  . Alcohol Use: 3.0 - 3.6 oz/week    3-4 Glasses of wine, 2 Cans of beer per week      Comment: infrequently- "indulge heavly when I do"  . Drug Use: No  . Sexual Activity: Not on file          Social History Narrative    History  Smoking status  . Former Smoker  . Types: Cigarettes  . Quit date: 12/30/1965  Smokeless tobacco  . Never Used    History  Alcohol Use  . 3.0 - 3.6 oz/week  . 3-4 Glasses of wine, 2 Cans of beer per week    Comment: infrequently- "indulge heavly when I do"     Allergies  Allergen Reactions  . Ramipril Cough  . Statins     Confusion Muscle pain     Current Facility-Administered Medications  Medication Dose Route Frequency Provider Last Rate Last Dose  . aminocaproic acid (AMICAR) 10 g in sodium chloride 0.9 % 100 mL infusion   Intravenous To OR Grace Isaac, MD      . cefUROXime (ZINACEF) 1.5 g in dextrose 5 % 50 mL IVPB  1.5 g Intravenous To OR Grace Isaac, MD      . cefUROXime (ZINACEF) 750 mg in dextrose 5 % 50 mL IVPB  750 mg Intravenous To OR Grace Isaac, MD      . dexmedetomidine (PRECEDEX) 400 MCG/100ML (4 mcg/mL) infusion  0.1-0.7 mcg/kg/hr Intravenous To OR Grace Isaac, MD      . DOPamine (INTROPIN) 800 mg in dextrose 5 % 250 mL (3.2 mg/mL) infusion  0-10 mcg/kg/min Intravenous To OR Grace Isaac, MD      . EPINEPHrine (ADRENALIN) 4 mg in dextrose 5 % 250 mL (0.016 mg/mL) infusion  0-10 mcg/min Intravenous To OR Grace Isaac, MD      . heparin 2,500 Units, papaverine 30 mg in electrolyte-148 (PLASMALYTE-148) 500 mL irrigation   Irrigation To OR Grace Isaac, MD      . heparin 30,000 units/NS 1000 mL solution for CELLSAVER   Other To OR Grace Isaac, MD      . insulin regular (NOVOLIN R,HUMULIN R) 250 Units in sodium chloride 0.9 % 250 mL (1 Units/mL) infusion   Intravenous To OR Grace Isaac, MD      . magnesium sulfate (IV Push/IM) injection 40 mEq  40 mEq Other To OR Grace Isaac, MD      . nitroGLYCERIN 50 mg in dextrose 5 % 250 mL (0.2 mg/mL) infusion  2-200  mcg/min Intravenous To OR Grace Isaac, MD      . phenylephrine (NEO-SYNEPHRINE) 20 mg in dextrose 5 % 250 mL (0.08 mg/mL) infusion  30-200 mcg/min Intravenous To OR Grace Isaac, MD      . potassium chloride injection 80 mEq  80 mEq Other To OR Grace Isaac, MD      . vancomycin (VANCOCIN) 1,250 mg in sodium chloride 0.9 % 250 mL IVPB  1,250 mg Intravenous To OR Grace Isaac, MD       Facility-Administered Medications Ordered in Other Encounters  Medication Dose Route Frequency Provider Last Rate Last Dose  . fentaNYL (SUBLIMAZE) injection    Anesthesia Intra-op Ariel Leffew Holtzman, CRNA   50 mcg at 07/19/16 0701  . midazolam (VERSED) 5 MG/5ML injection    Anesthesia Intra-op Ariel Leffew Holtzman, CRNA   1 mg at 07/19/16 2751     Review of Systems:     Cardiac Review of Systems: Y or N  Chest Pain [  n  ]  Resting SOB [  n ] Exertional SOB  [ y ]  Orthopnea [ n ]   Pedal Edema [ n  ]    Palpitations [n  ] Syncope  [n  ]   Presyncope [n   ]  General Review of Systems: [Y] = yes [  ]=no Constitional:  recent weight change [  ];  Wt loss over the last 3 months [   ] anorexia [  ]; fatigue [  ]; nausea [  ]; night sweats [  ]; fever [  ]; or chills [  ];          Dental: poor dentition[  ]; Last Dentist visit:   Eye : blurred vision [  ]; diplopia [   ]; vision changes [  ];  Amaurosis fugax[  ]; Resp: cough [  ];  wheezing[  ];  hemoptysis[  ]; shortness of breath[y  ]; paroxysmal nocturnal dyspnea[  ]; dyspnea on exertion[y  ]; or orthopnea[  ];  GI:  gallstones[  ], vomiting[  ];  dysphagia[  ]; melena[  ];  hematochezia [  ]; heartburn[  ];   Hx of  Colonoscopy[  ]; GU: kidney stones [  ]; hematuria[  ];   dysuria [  ];  nocturia[  ];  history of     obstruction [  ]; urinary frequency [  ]             Skin: rash, swelling[  ];, hair loss[  ];  peripheral edema[  ];  or itching[  ]; Musculosketetal: myalgias[  ];  joint swelling[  ];  joint erythema[  ];  joint pain[   ];  back pain[  ];  Heme/Lymph: bruising[  ];  bleeding[  ];  anemia[  ];  Neuro: TIA[n  ];  headaches[  ];  stroke[  ];  vertigo[  ];  seizures[  n];   paresthesias[  ];  difficulty n];  Psych:depression[  ]; anxiety[  ];  Endocrine: diabetes[  ];  thyroid dysfunction[  ];  Immunizations: Flu up to date [  ]; Pneumococcal up to date [  ];  Other:  Physical Exam: BP 124/71 mmHg  Pulse 64  Temp(Src) 97.9 F (36.6 C) (Oral)  Resp 18  Wt 162 lb (73.483 kg)  SpO2 98%  PHYSICAL EXAMINATION: General appearance: alert, cooperative and appears stated age Head: Normocephalic, without obvious abnormality, atraumatic Neck: no adenopathy, no carotid bruit, no JVD, supple, symmetrical, trachea midline and thyroid not enlarged, symmetric, no tenderness/mass/nodules Lymph nodes: Cervical, supraclavicular, and axillary nodes normal. Resp: clear to auscultation bilaterally Back: symmetric, no curvature. ROM normal. No CVA tenderness. Cardio: regular rate and rhythm, S1, S2 normal, no murmur, click, rub or gallop GI: soft, non-tender; bowel sounds normal; no masses,  no organomegaly Extremities: extremities normal, atraumatic, no cyanosis or edema Neurologic: Grossly normal  Diagnostic Studies & Laboratory data:     Recent Radiology Findings:  Dg Chest 2 View  07/17/2016  CLINICAL DATA:  Preop for CABG, former smoking history EXAM: CHEST  2 VIEW COMPARISON:  CT chest of 12/06/2015 and chest x-ray of 05/26/2014 FINDINGS: Volume loss of the left hemi thorax is again noted with some elevation of the left hemidiaphragm remaining. The right lung is clear. Mediastinal hilar contours are unchanged and the heart is borderline enlarged and stable. No acute bony abnormality is seen. IMPRESSION: Stable volume loss in the left hemi thorax with elevation of left hemidiaphragm. No definite active process. Electronically Signed   By: Ivar Drape M.D.   On: 07/17/2016 13:39   Ct Chest Wo Contrast  07/18/2016   CLINICAL DATA:  80 year old male under preoperative evaluation prior to CABG. Former smoker who quit 50 years ago. History of left upper lobe lung cancer status post left upper lobectomy.  Followup study. EXAM: CT CHEST WITHOUT CONTRAST TECHNIQUE: Multidetector CT imaging of the chest was performed following the standard protocol without IV contrast. COMPARISON:  Chest CT 12/15/2014. FINDINGS: Cardiovascular: Heart size is mildly enlarged. There is no significant pericardial fluid, thickening or pericardial calcification. There is aortic atherosclerosis, as well as atherosclerosis of the great vessels of the mediastinum and the coronary arteries, including calcified atherosclerotic plaque in the left main, left anterior descending, left circumflex and right coronary arteries. Mediastinum/Nodes: No pathologically enlarged mediastinal or hilar lymph nodes. Please note that accurate exclusion of hilar adenopathy is limited on noncontrast CT scans. Esophagus is unremarkable in appearance. Lungs/Pleura: Status post left upper lobectomy. Compensatory hyperexpansion of the left lower lobe. Small amount of pleural thickening in the base of the left hemithorax (unchanged). No significant pleural fluid. No suspicious appearing pulmonary nodules or masses. No acute consolidative airspace disease. Upper Abdomen: 1 cm low-attenuation lesion in segment 7 of the liver (image 105 of series 3) incompletely characterized on today's noncontrast CT examination, but similar to the prior examination, likely a small cyst. Aortic atherosclerosis. Colonic diverticulosis in the region of the splenic flexure without surrounding inflammatory changes to suggest an acute diverticulitis at this time. Nonobstructive calculi in the lower pole collecting system of left kidney measuring up to 6 mm. Musculoskeletal: There are no aggressive appearing lytic or blastic lesions noted in the visualized portions of the skeleton. IMPRESSION: 1. Status post  left upper lobectomy. No findings to suggest recurrent or metastatic disease in the thorax. 2. Aortic atherosclerosis, in addition to left main and 3 vessel coronary artery disease. Assessment for potential risk factor modification, dietary therapy or pharmacologic therapy may be warranted, if clinically indicated. 3. Mild cardiomegaly. 4. Nonobstructive calculi in the collecting system of the left kidney measuring up to 6 mm in the lower pole. 5. Colonic diverticulosis. 6. Additional incidental findings, as above. Electronically Signed   By: Vinnie Langton M.D.   On: 07/18/2016 16:38   CARDIAC MRI 08/2015 CLINICAL DATA: 80 year old male with non-ischemic cardiomyopathy and new decrease of LVEF on nuclear stress test. Evaluate LVEF.  EXAM: CARDIAC MRI  TECHNIQUE: The patient was scanned on a 1.5 Tesla GE magnet. A dedicated cardiac coil was used. Functional imaging was done using Fiesta sequences. 2,3, and 4 chamber views were done to assess for RWMA's. Modified Simpson's rule using a short axis stack was used to calculate an ejection fraction on a dedicated work Conservation officer, nature. The patient received 24 cc of Multihance. After 10 minutes inversion recovery sequences were used to assess for infiltration and scar tissue.  CONTRAST: 24 cc of Multihance  FINDINGS: 1. Mildly dilated left ventricle with mild concentric hypertrophy and moderately impaired systolic function (LVEF = 35%) with diffuse hypokinesis.  LVEDD: 60 mm  LVESD: 51 mm  LVEDV: 167 ml  LVESV: 110 ml  SV: 58 ml  CO: 3.9 L/minute  2. Normal right ventricular size, thickness and low normal systolic function (RVEF = 47%).  3. Moderate mitral and mild tricuspid regurgitation.  4. Moderate left atrial and mild atrial dilatation.  5. Dilated main pulmonary artery.  6. Normal size of the aorta and aortic arch.  7. No late gadolinium enhancement present in the myocardium  of the left and right ventricle.  8. No pericardial effusion is present.  IMPRESSION: 1. Mildly dilated left ventricle with mild concentric hypertrophy and moderately impaired systolic function (LVEF = 35%) with diffuse hypokinesis.  2. Normal right ventricular size, thickness and  low normal systolic function (RVEF = 47%).  3. Moderate mitral and mild tricuspid regurgitation.  4. Moderate left atrial and mild atrial dilatation.  5. Dilated main pulmonary artery.  6. No late gadolinium enhancement present in the myocardium of the left and right ventricle.  Conclusively, these findings are consistent with dilated cardiomyopathy and moderately impaired LVEF.  Ena Dawley   Electronically Signed  By: Ena Dawley  On: 09/22/2015 14:08 \ I have independently reviewed the above radiologic studies.  Recent Lab Findings: Lab Results  Component Value Date   WBC 6.4 07/17/2016   HGB 14.7 07/17/2016   HCT 43.8 07/17/2016   PLT 175 07/17/2016   GLUCOSE 89 07/17/2016   CHOL 224* 08/17/2015   TRIG 124.0 08/17/2015   HDL 43.80 08/17/2015   LDLDIRECT 147.6 11/08/2013   LDLCALC 155* 08/17/2015   ALT 20 07/17/2016   AST 16 07/17/2016   NA 139 07/17/2016   K 4.5 07/17/2016   CL 108 07/17/2016   CREATININE 1.02 07/17/2016   BUN 25* 07/17/2016   CO2 27 07/17/2016   TSH 5.698* 11/30/2007   INR 1.06 07/17/2016   HGBA1C 5.8* 07/17/2016  CATH Report: Left Heart Cath and Coronary Angiography    Conclusion     Prox RCA lesion, 70% stenosed.  3rd RPLB lesion, 100% stenosed.  Ost Cx lesion, 35% stenosed.  Dist Cx lesion, 95% stenosed.  Mid Cx lesion, 40% stenosed.  3rd Mrg lesion, 80% stenosed.  LM lesion, 75% stenosed.  Ost LAD lesion, 65% stenosed.  1st Diag lesion, 95% stenosed.  Lat 1st Diag lesion, 85% stenosed   The patient has had progressive canadian class 4 anginal symptoms with a high probability stress test with risk factors  including high blood pressure and high cholesterol. With inferior and apical reversable defect reduced left ventricular function with ejection fraction of 25% severe 3 vessel coronary artery disease  There is significant stenosis of circumflex as culpret artery Continue medical management of CAD risk factors, Consider PCI and stent placement in culprit artery and Consider consultation for CABG due to multivessel cad with reduced EF vs PCI and medical mgt of culpret vessel   I have independently reviewed the above  cath films and reviewed the findings with the  patient .  ECHO- Office Early: CARDIOLOGY DEPARTMENT [place], [place] #: [phone] [address] [place], [place] [address] Date: 06/26/2016 08:16 AM Adult Male Age: 3 yrs ECHOCARDIOGRAM REPORT Outpatient [facility]::[facility] STUDY:CHEST WALL TAPE: MD1: KOWALSKI, BRUCE JAY ECHO:Yes DOPPLER:Yes FILE: BP: 124/80 mmHg COLOR:Yes CONTRAST:No MACHINE:Philips Height: 64 in RV BIOPSY:No 3D:No SOUND QLTY:Moderate Weight: 162 lb MEDIUM:None BSA: 1.8 m2  ___________________________________________________________________________________________ HISTORY:DOE REASON:Assess, LV function INDICATION:Coronary artery disease of native artery of native heart with st ___________________________________________________________________________________________  ECHOCARDIOGRAPHIC MEASUREMENTS 2D DIMENSIONS AORTA Values Normal Range MAIN PA Values Normal Range Annulus: 2.1 cm [2.3 - 2.9] PA Main: nm* [1.5 - 2.1] Aorta Sin: nm* [3.1 - 3.7] RIGHT VENTRICLE ST Junction: nm* [2.6 - 3.2] RV Base: nm* [ < 4.2] Asc.Aorta: nm* [2.6 - 3.4] RV Mid: nm* [ < 3.5] LEFT VENTRICLE RV Length: nm* [ < 8.6] LVIDd: 5.5 cm [4.2 - 5.9] INFERIOR VENA CAVA LVIDs: 5.0 cm Max. IVC: nm* [ <= 2.1] FS: 8.4 % [> 25] Min. IVC: nm* SWT: 1.1 cm [0.6 - 1.0] ------------------ PWT: 0.95 cm [0.6 - 1.0] nm* - not measured LEFT  ATRIUM LA Diam: 5.0 cm [3.0 - 4.0] LA A4C Area: nm* [ <  20] LA Volume: nm* [18 - 58] ___________________________________________________________________________________________  ECHOCARDIOGRAPHIC DESCRIPTIONS  AORTIC ROOT Size:Normal Dissection:INDETERM FOR DISSECTION  AORTIC VALVE Leaflets:Tricuspid Morphology:Normal Mobility:Fully mobile  LEFT VENTRICLE Size:MODERATELY ENLARGED Anterior:HYPOCONTRACTILE Contraction:SEVERE GLOBAL DECREASE Septal:HYPOCONTRACTILE Closest EF:20% (Estimated) Apical:HYPOCONTRACTILE LV Masses:No Masses Inferior:HYPOCONTRACTILE WYS:HUOH LVH Posterior:HYPOCONTRACTILE Lateral:HYPOCONTRACTILE Dias.FxClass:N/A  MITRAL VALVE Leaflets:Normal Mobility:Fully mobile Morphology:Normal  LEFT ATRIUM Size:SEVERELY ENLARGED LA Masses:No masses IA Septum:Normal IAS  MAIN PA Size:Normal  PULMONIC VALVE Morphology:Normal Mobility:Fully mobile  RIGHT VENTRICLE RV Masses:No Masses Size:Normal Free Wall:Normal Contraction:Normal  TRICUSPID VALVE Leaflets:Normal Mobility:Fully mobile Morphology:Normal  RIGHT ATRIUM Size:MILDLY ENLARGED RA Other:None RA Mass:No masses  PERICARDIUM Fluid:No effusion  INFERIOR VENACAVA Size:Normal Normal respiratory collapse  ____________________________________________________________________  DOPPLER ECHO and OTHER SPECIAL PROCEDURES Aortic:No AR No AS 109.0 cm/sec peak vel 4.8 mmHg peak grad  Mitral:MODERATE MR No MS 3.2 cm^2 by DOPPLER MV Inflow E Vel=86.4 cm/sec MV Annulus E'Vel=4.3 cm/sec E/E'Ratio=20.1  Tricuspid:MILD TR No TS 276.0 cm/sec peak TR vel 40.5 mmHg peak RV pressure  Pulmonary:MILD PR No PS 76.9 cm/sec peak vel 2.4 mmHg peak grad    ___________________________________________________________________________________________  INTERPRETATION SEVERE LV SYSTOLIC DYSFUNCTION (See above) MODERATE VALVULAR REGURGITATION (See above) NO VALVULAR STENOSIS MILD PHTN EF  20-25%  ___________________________________________________________________________________________  Electronically signed by: MD Serafina Royals on 06/26/2016 01:59 PM Performed By: Maurilio Lovely, RDCS Ordering Physician: Serafina Royals __________________________________________________________________________   Patient had a Cardiolite study dated 06/26/2016 ejection fraction 21% demonstrated hypokinesis of the posterior myocardium 1 septum normal treadmill EKG without evidence of ischemia or arrhythmia reversible apical and inferior myocardial perfusion defect consistent with myocardial ischemia was thought to be a high-risk scan read by Dr. Nehemiah Massed     Assessment / Plan:   Ischemic Cardiomyopathy- with progressive 3v cad. I have recommended to the patient proceeding with CABG  Risks and options discussed with the patient and wife . He is aware of increased risk of surgery with poor LV function.    CT of chest follow up previous resection of left upper lobe with IB non small cell lung cancer - CT yesterday no evidence of recurrent Lung cancer in the chest  The goals risks and alternatives of the planned surgical procedure CABG  have been discussed with the patient in detail. The risks of the procedure including death, infection, stroke, myocardial infarction, bleeding, blood transfusion have all been discussed specifically. Patient and wife aware of increased risk dur to LV dysfunction. I have quoted [name] [name] a 5 % of perioperative mortality and a complication rate as high as 40 %. The patient's questions have been answered.[name] [name] is willing  to proceed with the planned procedure. Grace Isaac MD      [address] [place] [address] Office [phone]   Beeper [phone]  07/19/2016 7:19 AM

## 2016-07-19 NOTE — Anesthesia Procedure Notes (Addendum)
Central Venous Catheter Insertion Performed by: anesthesiologist Patient location: Pre-op. Preanesthetic checklist: patient identified, IV checked, site marked, risks and benefits discussed, surgical consent, monitors and equipment checked, pre-op evaluation, timeout performed and anesthesia consent Position: Trendelenburg Lidocaine 1% used for infiltration Landmarks identified Catheter size: 8.5 Fr PA cath was placed.Sheath introducer Swan type and PA catheter depth:thermodilationProcedure performed using ultrasound guided technique. Attempts: 1 Following insertion, line sutured, dressing applied and Biopatch. Patient tolerated the procedure well with no immediate complications.   Procedure Name: Intubation Date/Time: 07/19/2016 7:54 AM Performed by: Tressia Miners LEFFEW Pre-anesthesia Checklist: Patient identified, Patient being monitored, Timeout performed, Emergency Drugs available and Suction available Patient Re-evaluated:Patient Re-evaluated prior to inductionOxygen Delivery Method: Circle System Utilized Preoxygenation: Pre-oxygenation with 100% oxygen Intubation Type: IV induction Ventilation: Mask ventilation without difficulty Laryngoscope Size: Glidescope and 3 Grade View: Grade I Tube type: Oral Tube size: 8.0 mm Number of attempts: 1 Airway Equipment and Method: Stylet Placement Confirmation: ETT inserted through vocal cords under direct vision,  positive ETCO2 and breath sounds checked- equal and bilateral Secured at: 23 cm Tube secured with: Tape Dental Injury: Teeth and Oropharynx as per pre-operative assessment

## 2016-07-19 NOTE — Progress Notes (Signed)
Patient ID: [name] [name], male   DOB: Jan 14, 1936, 80 y.o.   MRN: [id number]  SICU Evening Rounds:   Hemodynamically stable  CI = 2.1 Milrinone 0.3 and dop 3   Remains on vent  Urine output good  CT output low  CBC    Component Value Date/Time   WBC 13.1* 07/19/2016 1515   RBC 3.67* 07/19/2016 1515   HGB 11.2* 07/19/2016 1515   HCT 33.4* 07/19/2016 1515   PLT 111* 07/19/2016 1515   MCV 91.0 07/19/2016 1515   MCH 30.5 07/19/2016 1515   MCHC 33.5 07/19/2016 1515   RDW 12.2 07/19/2016 1515     BMET    Component Value Date/Time   NA 144 07/19/2016 1512   K 3.4* 07/19/2016 1512   CL 101 07/19/2016 1314   CO2 27 07/17/2016 1103   GLUCOSE 113* 07/19/2016 1512   BUN 20 07/19/2016 1314   CREATININE 0.60* 07/19/2016 1314   CREATININE 0.99 12/09/2012 0800   CALCIUM 9.4 07/17/2016 1103   GFRNONAA >60 07/17/2016 1103   GFRAA >60 07/17/2016 1103     A/P:  Stable postop course. Continue current plans

## 2016-07-19 NOTE — Procedures (Signed)
Extubation Procedure Note  Patient Details:   Name: [name] [name] DOB: [birth year] MRN: [id number]  Patient extubated to 4L [place]. Patient sat 100%. Patient got NIF -50, AND VC 1700. Patient is no distress at this time. No stridor noted.    Evaluation  O2 sats: stable throughout Complications: No apparent complications Patient did tolerate procedure well. Bilateral Breath Sounds: Clear, Diminished   Yes  Kelle Darting 07/19/2016, 2026

## 2016-07-19 NOTE — Progress Notes (Signed)
  Echocardiogram 2D Echocardiogram has been performed.  Darlina Sicilian M 07/19/2016, 8:37 AM

## 2016-07-19 NOTE — Transfer of Care (Signed)
Immediate Anesthesia Transfer of Care Note  Patient: [name] [name]  Procedure(s) Performed: Procedure(s): CORONARY ARTERY BYPASS GRAFTING (CABG) x 4 (LIMA to LAD, SVG to DIAGONAL, SVG to CIRCUMFLEX, SVG to PDA) with EVH from [place] and LEFT INTERNAL MAMMARY ARTERY (N/A) TRANSESOPHAGEAL ECHOCARDIOGRAM (TEE) (N/A) Right sternoclavicular joint plating  (Right)  Patient Location: ICU  Anesthesia Type:General  Level of Consciousness: Patient remains intubated per anesthesia plan  Airway & Oxygen Therapy: Patient remains intubated per anesthesia plan and Patient placed on Ventilator (see vital sign flow sheet for setting)  Post-op Assessment: Report given to RN  Post vital signs: Reviewed and stable  Last Vitals:  Filed Vitals:   07/19/16 0609 07/19/16 1502  BP: 124/71 68/39  Pulse: 64 104  Temp: 36.6 C   Resp: 18 12    Last Pain: There were no vitals filed for this visit.    Patients Stated Pain Goal: 2 (72/89/79 1504)  Complications: No apparent anesthesia complications

## 2016-07-20 ENCOUNTER — Inpatient Hospital Stay (HOSPITAL_COMMUNITY): Payer: PPO

## 2016-07-20 ENCOUNTER — Encounter (HOSPITAL_COMMUNITY): Payer: Self-pay | Admitting: *Deleted

## 2016-07-20 LAB — GLUCOSE, CAPILLARY
Glucose-Capillary: 101 mg/dL — ABNORMAL HIGH (ref 65–99)
Glucose-Capillary: 102 mg/dL — ABNORMAL HIGH (ref 65–99)
Glucose-Capillary: 108 mg/dL — ABNORMAL HIGH (ref 65–99)
Glucose-Capillary: 111 mg/dL — ABNORMAL HIGH (ref 65–99)
Glucose-Capillary: 116 mg/dL — ABNORMAL HIGH (ref 65–99)
Glucose-Capillary: 117 mg/dL — ABNORMAL HIGH (ref 65–99)
Glucose-Capillary: 117 mg/dL — ABNORMAL HIGH (ref 65–99)
Glucose-Capillary: 118 mg/dL — ABNORMAL HIGH (ref 65–99)
Glucose-Capillary: 118 mg/dL — ABNORMAL HIGH (ref 65–99)
Glucose-Capillary: 120 mg/dL — ABNORMAL HIGH (ref 65–99)
Glucose-Capillary: 121 mg/dL — ABNORMAL HIGH (ref 65–99)
Glucose-Capillary: 121 mg/dL — ABNORMAL HIGH (ref 65–99)
Glucose-Capillary: 129 mg/dL — ABNORMAL HIGH (ref 65–99)
Glucose-Capillary: 135 mg/dL — ABNORMAL HIGH (ref 65–99)
Glucose-Capillary: 142 mg/dL — ABNORMAL HIGH (ref 65–99)
Glucose-Capillary: 155 mg/dL — ABNORMAL HIGH (ref 65–99)
Glucose-Capillary: 157 mg/dL — ABNORMAL HIGH (ref 65–99)
Glucose-Capillary: 162 mg/dL — ABNORMAL HIGH (ref 65–99)
Glucose-Capillary: 95 mg/dL (ref 65–99)

## 2016-07-20 LAB — CBC
HCT: 26.8 % — ABNORMAL LOW (ref 39.0–52.0)
HEMATOCRIT: 28.1 % — AB (ref 39.0–52.0)
HEMOGLOBIN: 9.4 g/dL — AB (ref 13.0–17.0)
Hemoglobin: 9.2 g/dL — ABNORMAL LOW (ref 13.0–17.0)
MCH: 30.6 pg (ref 26.0–34.0)
MCH: 31.1 pg (ref 26.0–34.0)
MCHC: 33.5 g/dL (ref 30.0–36.0)
MCHC: 34.3 g/dL (ref 30.0–36.0)
MCV: 91.5 fL (ref 78.0–100.0)
MCV: 90.5 fL (ref 78.0–100.0)
PLATELETS: 125 10*3/uL — AB (ref 150–400)
Platelets: 124 10*3/uL — ABNORMAL LOW (ref 150–400)
RBC: 3.07 MIL/uL — ABNORMAL LOW (ref 4.22–5.81)
RBC: 2.96 MIL/uL — AB (ref 4.22–5.81)
RDW: 12.4 % (ref 11.5–15.5)
RDW: 12.7 % (ref 11.5–15.5)
WBC: 13.9 10*3/uL — ABNORMAL HIGH (ref 4.0–10.5)
WBC: 14.4 10*3/uL — AB (ref 4.0–10.5)

## 2016-07-20 LAB — BASIC METABOLIC PANEL
Anion gap: 3 — ABNORMAL LOW (ref 5–15)
BUN: 12 mg/dL (ref 6–20)
CHLORIDE: 110 mmol/L (ref 101–111)
CO2: 23 mmol/L (ref 22–32)
CREATININE: 0.8 mg/dL (ref 0.61–1.24)
Calcium: 7.1 mg/dL — ABNORMAL LOW (ref 8.9–10.3)
GFR calc Af Amer: >60 mL/min (ref >60)
GFR calc non Af Amer: >60 mL/min (ref >60)
Glucose, Bld: 118 mg/dL — ABNORMAL HIGH (ref 65–99)
Potassium: 4 mmol/L (ref 3.5–5.1)
Sodium: 136 mmol/L (ref 135–145)

## 2016-07-20 LAB — MAGNESIUM
MAGNESIUM: 2.2 mg/dL (ref 1.7–2.4)
Magnesium: 2.3 mg/dL (ref 1.7–2.4)

## 2016-07-20 LAB — CREATININE, SERUM
CREATININE: 0.9 mg/dL (ref 0.61–1.24)
GFR calc non Af Amer: >60 mL/min (ref >60)

## 2016-07-20 LAB — POCT I-STAT, CHEM 8
BUN: 14 mg/dL (ref 6–20)
Calcium, Ion: 1.09 mmol/L — ABNORMAL LOW (ref 1.12–1.23)
Chloride: 101 mmol/L (ref 101–111)
Creatinine, Ser: 0.9 mg/dL (ref 0.61–1.24)
Glucose, Bld: 142 mg/dL — ABNORMAL HIGH (ref 65–99)
HCT: 26 % — ABNORMAL LOW (ref 39.0–52.0)
Hemoglobin: 8.8 g/dL — ABNORMAL LOW (ref 13.0–17.0)
Potassium: 4.2 mmol/L (ref 3.5–5.1)
Sodium: 136 mmol/L (ref 135–145)
TCO2: 20 mmol/L (ref 0–100)

## 2016-07-20 MED ORDER — ENOXAPARIN SODIUM 40 MG/0.4ML ~~LOC~~ SOLN
40.0000 mg | Freq: Every day | SUBCUTANEOUS | Status: DC
  Administered 2016-07-20 – 2016-07-28 (×9): 40 mg via SUBCUTANEOUS
  Filled 2016-07-20 (×9): qty 0.4

## 2016-07-20 MED ORDER — INSULIN ASPART 100 UNIT/ML ~~LOC~~ SOLN
0.0000 [IU] | SUBCUTANEOUS | Status: DC
  Administered 2016-07-20: 4 [IU] via SUBCUTANEOUS
  Administered 2016-07-20 – 2016-07-21 (×5): 2 [IU] via SUBCUTANEOUS
  Administered 2016-07-21 (×2): 4 [IU] via SUBCUTANEOUS
  Administered 2016-07-21 – 2016-07-23 (×6): 2 [IU] via SUBCUTANEOUS

## 2016-07-20 MED ORDER — CETYLPYRIDINIUM CHLORIDE 0.05 % MT LIQD
7.0000 mL | Freq: Two times a day (BID) | OROMUCOSAL | Status: DC
  Administered 2016-07-20 – 2016-07-22 (×5): 7 mL via OROMUCOSAL

## 2016-07-20 NOTE — Progress Notes (Signed)
Patient ID: [name] [name], male   DOB: April 24, 1936, 80 y.o.   MRN: [id number]   SICU Evening Rounds:   Hemodynamically stable, sinus 90's  Neo off Remains on Milrinone 0.2 and dop 3  Urine output good  CT output low  CBC    Component Value Date/Time   WBC 13.9* 07/20/2016 0400   RBC 3.07* 07/20/2016 0400   HGB 9.4* 07/20/2016 0400   HCT 28.1* 07/20/2016 0400   PLT 124* 07/20/2016 0400   MCV 91.5 07/20/2016 0400   MCH 30.6 07/20/2016 0400   MCHC 33.5 07/20/2016 0400   RDW 12.4 07/20/2016 0400     BMET    Component Value Date/Time   NA 136 07/20/2016 0400   K 4.0 07/20/2016 0400   CL 110 07/20/2016 0400   CO2 23 07/20/2016 0400   GLUCOSE 118* 07/20/2016 0400   BUN 12 07/20/2016 0400   CREATININE 0.80 07/20/2016 0400   CREATININE 0.99 12/09/2012 0800   CALCIUM 7.1* 07/20/2016 0400   GFRNONAA >60 07/20/2016 0400   GFRAA >60 07/20/2016 0400     A/P:  Stable postop course. Continue current plans

## 2016-07-20 NOTE — Progress Notes (Signed)
1 Day Post-Op Procedure(s) (LRB): CORONARY ARTERY BYPASS GRAFTING (CABG) x 4 (LIMA to LAD, SVG to DIAGONAL, SVG to CIRCUMFLEX, SVG to PDA) with EVH from [place] and LEFT INTERNAL MAMMARY ARTERY (N/A) TRANSESOPHAGEAL ECHOCARDIOGRAM (TEE) (N/A) Right sternoclavicular joint plating  (Right) Subjective:  No specific complaints this am.  Objective: Vital signs in last 24 hours: Temp:  [96.1 F (35.6 C)-99 F (37.2 C)] 98.4 F (36.9 C) (07/22 0800) Pulse Rate:  [74-104] 79 (07/22 0800) Cardiac Rhythm:  [-] Normal sinus rhythm (07/22 0800) Resp:  [0-24] 12 (07/22 0800) BP: (68-110)/(39-71) 94/55 mmHg (07/22 0800) SpO2:  [98 %-100 %] 99 % (07/22 0800) Arterial Line BP: (74-125)/(45-88) 106/54 mmHg (07/22 0800) FiO2 (%):  [40 %-50 %] 40 % (07/21 2000) Weight:  [73.48 kg (161 lb 15.9 oz)-82.5 kg (181 lb 14.1 oz)] 82.5 kg (181 lb 14.1 oz) (07/22 0500)  Hemodynamic parameters for last 24 hours: PAP: (25-49)/(12-28) 37/19 mmHg CO:  [3.4 L/min-5.1 L/min] 5.1 L/min CI:  [1.9 L/min/m2-2.9 L/min/m2] 2.9 L/min/m2  Intake/Output from previous day: 07/21 0701 - 07/22 0700 In: 5487 [I.V.:3337; Blood:500; IV Piggyback:1650] Out: 7893 [Urine:3850; Blood:1400; Chest Tube:292] Intake/Output this shift: Total I/O In: 78.2 [I.V.:78.2] Out: 45 [Urine:45]  General appearance: alert and cooperative Neurologic: intact Heart: regular rate and rhythm Lungs: clear to auscultation bilaterally Extremities: edema mild Wound: dressings dry  Lab Results:  Recent Labs  07/19/16 2108 07/20/16 0400  WBC 14.1* 13.9*  HGB 10.1* 9.4*  HCT 29.5* 28.1*  PLT 128* 124*   BMET:  Recent Labs  07/17/16 1103  07/19/16 2102 07/19/16 2108 07/20/16 0400  NA 139  < > 141  --  136  K 4.5  < > 3.7  --  4.0  CL 108  < > 105  --  110  CO2 27  --   --   --  23  GLUCOSE 89  < > 126*  --  118*  BUN 25*  < > 15  --  12  CREATININE 1.02  < > 0.80 0.80 0.80  CALCIUM 9.4  --   --   --  7.1*  <  > = values in this interval not displayed.  PT/INR:  Recent Labs  07/19/16 1515  LABPROT 18.2*  INR 1.50*   ABG    Component Value Date/Time   PHART 7.318* 07/19/2016 2122   HCO3 23.0 07/19/2016 2122   TCO2 24 07/19/2016 2122   ACIDBASEDEF 3.0* 07/19/2016 2122   O2SAT 98.0 07/19/2016 2122   CBG (last 3)   Recent Labs  07/19/16 1703 07/19/16 1759 07/19/16 1853  GLUCAP 103* 96 108*   CXR: ok  ECG: NSR, mild diffuse ST elevation consistent with early repol or pericarditis, QTc 502.  Assessment/Plan: S/P Procedure(s) (LRB): CORONARY ARTERY BYPASS GRAFTING (CABG) x 4 (LIMA to LAD, SVG to DIAGONAL, SVG to CIRCUMFLEX, SVG to PDA) with EVH from [place] and LEFT INTERNAL MAMMARY ARTERY (N/A) TRANSESOPHAGEAL ECHOCARDIOGRAM (TEE) (N/A) Right sternoclavicular joint plating  (Right)  He is hemodynamically stable on dop 3, milrinone 0.3 and neo 10.  Wean neo off and decrease milrinone to 0.2. His preop EF was 20% but CI is 2.9 this am.  Remove femoral line, dangle later and remove chest tubes  Diurese once off vasopressors.  Glucose under good control. No hx of DM and preop Hgb A1c was 5.8. Stop insulin drip and continue SSI today.   LOS: 1 day    Gaye Pollack  07/20/2016   

## 2016-07-21 ENCOUNTER — Inpatient Hospital Stay (HOSPITAL_COMMUNITY): Payer: PPO

## 2016-07-21 ENCOUNTER — Encounter (HOSPITAL_COMMUNITY): Payer: Self-pay | Admitting: Cardiothoracic Surgery

## 2016-07-21 LAB — BASIC METABOLIC PANEL
ANION GAP: 3 — AB (ref 5–15)
BUN: 15 mg/dL (ref 6–20)
CHLORIDE: 105 mmol/L (ref 101–111)
CO2: 25 mmol/L (ref 22–32)
CREATININE: 0.84 mg/dL (ref 0.61–1.24)
Calcium: 7.7 mg/dL — ABNORMAL LOW (ref 8.9–10.3)
GFR calc non Af Amer: >60 mL/min (ref >60)
Glucose, Bld: 146 mg/dL — ABNORMAL HIGH (ref 65–99)
POTASSIUM: 4.1 mmol/L (ref 3.5–5.1)
SODIUM: 133 mmol/L — AB (ref 135–145)

## 2016-07-21 LAB — GLUCOSE, CAPILLARY
Glucose-Capillary: 136 mg/dL — ABNORMAL HIGH (ref 65–99)
Glucose-Capillary: 136 mg/dL — ABNORMAL HIGH (ref 65–99)
Glucose-Capillary: 147 mg/dL — ABNORMAL HIGH (ref 65–99)
Glucose-Capillary: 147 mg/dL — ABNORMAL HIGH (ref 65–99)
Glucose-Capillary: 148 mg/dL — ABNORMAL HIGH (ref 65–99)
Glucose-Capillary: 162 mg/dL — ABNORMAL HIGH (ref 65–99)
Glucose-Capillary: 163 mg/dL — ABNORMAL HIGH (ref 65–99)

## 2016-07-21 LAB — CBC
HEMATOCRIT: 26 % — AB (ref 39.0–52.0)
HEMOGLOBIN: 8.8 g/dL — AB (ref 13.0–17.0)
MCH: 31.2 pg (ref 26.0–34.0)
MCHC: 33.8 g/dL (ref 30.0–36.0)
MCV: 92.2 fL (ref 78.0–100.0)
PLATELETS: 123 10*3/uL — AB (ref 150–400)
RBC: 2.82 MIL/uL — AB (ref 4.22–5.81)
RDW: 12.9 % (ref 11.5–15.5)
WBC: 15.1 10*3/uL — AB (ref 4.0–10.5)

## 2016-07-21 MED ORDER — MIDODRINE HCL 5 MG PO TABS
10.0000 mg | ORAL_TABLET | Freq: Three times a day (TID) | ORAL | Status: DC
  Administered 2016-07-21 – 2016-07-22 (×4): 10 mg via ORAL
  Filled 2016-07-21 (×4): qty 2

## 2016-07-21 MED ORDER — MILRINONE LACTATE IN DEXTROSE 20-5 MG/100ML-% IV SOLN: 0.1000 ug/kg/min | INTRAVENOUS | Status: DC

## 2016-07-21 NOTE — Progress Notes (Signed)
2 Days Post-Op Procedure(s) (LRB): CORONARY ARTERY BYPASS GRAFTING (CABG) x 4 (LIMA to LAD, SVG to DIAGONAL, SVG to CIRCUMFLEX, SVG to PDA) with EVH from [place] and LEFT INTERNAL MAMMARY ARTERY (N/A) TRANSESOPHAGEAL ECHOCARDIOGRAM (TEE) (N/A) Right sternoclavicular joint plating (Right) Subjective:  No complaints  Objective: Vital signs in last 24 hours: Temp:  [98.3 F (36.8 C)-99.2 F (37.3 C)] 98.3 F (36.8 C) (07/23 1200) Pulse Rate:  [87-110] 94 (07/23 1200) Cardiac Rhythm: Normal sinus rhythm (07/23 1200) Resp:  [10-27] 17 (07/23 1200) BP: (79-109)/(51-87) 96/57 (07/23 1200) SpO2:  [90 %-100 %] 94 % (07/23 1200) Weight:  [81.3 kg (179 lb 3.7 oz)] 81.3 kg (179 lb 3.7 oz) (07/23 0500)  Hemodynamic parameters for last 24 hours:    Intake/Output from previous day: 07/22 0701 - 07/23 0700 In: 1738.6 [P.O.:700; I.V.:938.6; IV Piggyback:100] Out: 1040 [Urine:920; Chest Tube:120] Intake/Output this shift: Total I/O In: 462.6 [P.O.:240; I.V.:172.6; IV Piggyback:50] Out: 475 [Urine:475]  General appearance: alert and cooperative Neurologic: intact Heart: regular rate and rhythm, S1, S2 normal, no murmur, click, rub or gallop Lungs: clear to auscultation bilaterally Extremities: edema moderate diffuse edema Wound: dressing dry  Lab Results:  Recent Labs  07/20/16 1730 07/20/16 1734 07/21/16 0505  WBC 14.4*  --  15.1*  HGB 9.2* 8.8* 8.8*  HCT 26.8* 26.0* 26.0*  PLT 125*  --  123*   BMET:  Recent Labs  07/20/16 0400  07/20/16 1734 07/21/16 0505  NA 136  --  136 133*  K 4.0  --  4.2 4.1  CL 110  --  101 105  CO2 23  --   --  25  GLUCOSE 118*  --  142* 146*  BUN 12  --  14 15  CREATININE 0.80  < > 0.90 0.84  CALCIUM 7.1*  --   --  7.7*  < > = values in this interval not displayed.  PT/INR:  Recent Labs  07/19/16 1515  LABPROT 18.2*  INR 1.50*   ABG    Component Value Date/Time   PHART 7.318 (L) 07/19/2016 2122   HCO3 23.0  07/19/2016 2122   TCO2 20 07/20/2016 1734   ACIDBASEDEF 3.0 (H) 07/19/2016 2122   O2SAT 98.0 07/19/2016 2122   CBG (last 3)   Recent Labs  07/21/16 0002 07/21/16 0309 07/21/16 0921  GLUCAP 163* 136* 147*   CLINICAL DATA:  CABG EXAM: PORTABLE CHEST 1 VIEW COMPARISON:  07/20/2016 FINDINGS: Low lung volumes. Left lung base is obscured. Pulmonary vascular congestion without frank interstitial edema. No pneumothorax. Cardiomegaly.  Postsurgical changes related to prior CABG. Interval removal of right IJ Swan-Ganz catheter. Right IJ venous sheath. Median sternotomy.  Status post ORIF of the medial right clavicle. IMPRESSION: Postsurgical changes related to prior CABG. Interval removal of right IJ Swan-Ganz catheter. No pneumothorax. Electronically Signed   By: Julian Hy M.D.   On: 07/21/2016 11:39   Assessment/Plan: S/P Procedure(s) (LRB): CORONARY ARTERY BYPASS GRAFTING (CABG) x 4 (LIMA to LAD, SVG to DIAGONAL, SVG to CIRCUMFLEX, SVG to PDA) with EVH from [place] and LEFT INTERNAL MAMMARY ARTERY (N/A) TRANSESOPHAGEAL ECHOCARDIOGRAM (TEE) (N/A) Right sternoclavicular joint plating (Right)  He remains neo dependent on dop 3 and milrinone 0.3. Preop EF 20%. Will decrease milrinone to 0.2. Start midodrine to support BP and then wean off neo and dopamine.  Volume excess with weight 17 lbs over preop. Will diurese once BP stable off neo.   LOS: 2 days  Gaye Pollack 07/21/2016

## 2016-07-21 NOTE — Progress Notes (Signed)
Patient ID: [name] [name], male   DOB: 1936/09/11, 80 y.o.   MRN: [id number]  SICU Evening Rounds:  Hemodynamically stable. Midodrine started today and off neo now. Still on milrinone and dopamine.  Urine output ok but not able to diurese yet.

## 2016-07-22 DIAGNOSIS — Z951 Presence of aortocoronary bypass graft: Secondary | ICD-10-CM

## 2016-07-22 DIAGNOSIS — I5023 Acute on chronic systolic (congestive) heart failure: Secondary | ICD-10-CM

## 2016-07-22 LAB — CBC
HCT: 25.8 % — ABNORMAL LOW (ref 39.0–52.0)
Hemoglobin: 8.3 g/dL — ABNORMAL LOW (ref 13.0–17.0)
MCH: 29.9 pg (ref 26.0–34.0)
MCHC: 32.2 g/dL (ref 30.0–36.0)
MCV: 92.8 fL (ref 78.0–100.0)
Platelets: 119 10*3/uL — ABNORMAL LOW (ref 150–400)
RBC: 2.78 MIL/uL — ABNORMAL LOW (ref 4.22–5.81)
RDW: 13 % (ref 11.5–15.5)
WBC: 10.9 10*3/uL — ABNORMAL HIGH (ref 4.0–10.5)

## 2016-07-22 LAB — GLUCOSE, CAPILLARY
Glucose-Capillary: 108 mg/dL — ABNORMAL HIGH (ref 65–99)
Glucose-Capillary: 145 mg/dL — ABNORMAL HIGH (ref 65–99)
Glucose-Capillary: 105 mg/dL — ABNORMAL HIGH (ref 65–99)
Glucose-Capillary: 147 mg/dL — ABNORMAL HIGH (ref 65–99)
Glucose-Capillary: 140 mg/dL — ABNORMAL HIGH (ref 65–99)

## 2016-07-22 LAB — BASIC METABOLIC PANEL
Anion gap: 3 — ABNORMAL LOW (ref 5–15)
BUN: 16 mg/dL (ref 6–20)
CALCIUM: 7.9 mg/dL — AB (ref 8.9–10.3)
CO2: 25 mmol/L (ref 22–32)
CREATININE: 0.96 mg/dL (ref 0.61–1.24)
Chloride: 106 mmol/L (ref 101–111)
GFR calc non Af Amer: >60 mL/min (ref >60)
Glucose, Bld: 105 mg/dL — ABNORMAL HIGH (ref 65–99)
Potassium: 4.2 mmol/L (ref 3.5–5.1)
Sodium: 134 mmol/L — ABNORMAL LOW (ref 135–145)

## 2016-07-22 LAB — CARBOXYHEMOGLOBIN
Carboxyhemoglobin: 1.4 % (ref 0.5–1.5)
METHEMOGLOBIN: 0.9 % (ref 0.0–1.5)
O2 Saturation: 59.3 %
Total hemoglobin: 7.6 g/dL — ABNORMAL LOW (ref 13.5–18.0)

## 2016-07-22 LAB — MAGNESIUM: Magnesium: 2 mg/dL (ref 1.7–2.4)

## 2016-07-22 LAB — TSH: TSH: 1.645 u[IU]/mL (ref 0.350–4.500)

## 2016-07-22 MED ORDER — FUROSEMIDE 10 MG/ML IJ SOLN
40.0000 mg | Freq: Once | INTRAMUSCULAR | Status: AC
  Administered 2016-07-22: 40 mg via INTRAVENOUS
  Filled 2016-07-22: qty 4

## 2016-07-22 MED ORDER — AMIODARONE LOAD VIA INFUSION
150.0000 mg | Freq: Once | INTRAVENOUS | Status: AC
  Administered 2016-07-22: 150 mg via INTRAVENOUS
  Filled 2016-07-22: qty 83.34

## 2016-07-22 MED ORDER — FUROSEMIDE 10 MG/ML IJ SOLN: 40.0000 mg | Freq: Two times a day (BID) | INTRAMUSCULAR | Status: DC

## 2016-07-22 MED ORDER — DIGOXIN 125 MCG PO TABS
0.1250 mg | ORAL_TABLET | Freq: Every day | ORAL | Status: DC
  Administered 2016-07-22: 0.125 mg via ORAL
  Filled 2016-07-22: qty 1

## 2016-07-22 MED ORDER — AMIODARONE HCL 200 MG PO TABS: 200.0000 mg | ORAL_TABLET | Freq: Every day | ORAL | Status: DC

## 2016-07-22 MED ORDER — AMIODARONE HCL 200 MG PO TABS
200.0000 mg | ORAL_TABLET | Freq: Two times a day (BID) | ORAL | Status: DC
  Administered 2016-07-22 – 2016-07-28 (×13): 200 mg via ORAL
  Filled 2016-07-22 (×13): qty 1

## 2016-07-22 MED ORDER — AMIODARONE HCL IN DEXTROSE 360-4.14 MG/200ML-% IV SOLN
60.0000 mg/h | INTRAVENOUS | Status: DC
  Administered 2016-07-22 (×2): 60 mg/h via INTRAVENOUS
  Filled 2016-07-22 (×2): qty 200

## 2016-07-22 MED ORDER — FUROSEMIDE 10 MG/ML IJ SOLN
40.0000 mg | Freq: Two times a day (BID) | INTRAMUSCULAR | Status: DC
  Administered 2016-07-22: 40 mg via INTRAVENOUS
  Filled 2016-07-22: qty 4

## 2016-07-22 MED ORDER — AMIODARONE HCL IN DEXTROSE 360-4.14 MG/200ML-% IV SOLN
30.0000 mg/h | INTRAVENOUS | Status: DC
  Administered 2016-07-22: 30 mg/h via INTRAVENOUS
  Filled 2016-07-22: qty 200

## 2016-07-22 MED FILL — Mannitol IV Soln 20%: INTRAVENOUS | Qty: 500 | Status: AC

## 2016-07-22 MED FILL — Lidocaine HCl IV Inj 20 MG/ML: INTRAVENOUS | Qty: 5 | Status: AC

## 2016-07-22 MED FILL — Heparin Sodium (Porcine) Inj 1000 Unit/ML: INTRAMUSCULAR | Qty: 10 | Status: AC

## 2016-07-22 MED FILL — Sodium Chloride IV Soln 0.9%: INTRAVENOUS | Qty: 2000 | Status: AC

## 2016-07-22 MED FILL — Electrolyte-R (PH 7.4) Solution: INTRAVENOUS | Qty: 8000 | Status: AC

## 2016-07-22 MED FILL — Sodium Bicarbonate IV Soln 8.4%: INTRAVENOUS | Qty: 50 | Status: AC

## 2016-07-22 NOTE — Progress Notes (Signed)
Patient ID: [name] [name], male   DOB: December 12, 1936, 80 y.o.   MRN: [id number] EVENING ROUNDS NOTE :     [address]       [place] [address]             [phone]                 3 Days Post-Op Procedure(s) (LRB): CORONARY ARTERY BYPASS GRAFTING (CABG) x 4 (LIMA to LAD, SVG to DIAGONAL, SVG to CIRCUMFLEX, SVG to PDA) with EVH from [place] and LEFT INTERNAL MAMMARY ARTERY (N/A) TRANSESOPHAGEAL ECHOCARDIOGRAM (TEE) (N/A) Right sternoclavicular joint plating (Right)  Total Length of Stay:  LOS: 3 days  BP 118/73   Pulse 71   Temp 98.6 F (37 C) (Oral)   Resp (!) 22   Ht '5\' 3"'$  (1.6 m)   Wt 177 lb 9.6 oz (80.6 kg)   SpO2 97%   BMI 31.46 kg/m   .Intake/Output      07/23 0701 - 07/24 0700 07/24 0701 - 07/25 0700   P.O. 720 120   I.V. (mL/kg) 664 (8.2) 367.6 (4.6)   Other 70 30   IV Piggyback 50    Total Intake(mL/kg) 1504 (18.7) 517.6 (6.4)   Urine (mL/kg/hr) 3455 (1.8) 1235 (1.3)   Chest Tube     Total Output 3455 1235   Net -1951 -717.4          . sodium chloride Stopped (07/20/16 1000)  . sodium chloride    . sodium chloride Stopped (07/20/16 1000)  . amiodarone 30 mg/hr (07/22/16 1445)  . lactated ringers Stopped (07/19/16 1500)  . lactated ringers 20 mL/hr at 07/22/16 1800  . phenylephrine (NEO-SYNEPHRINE) Adult infusion Stopped (07/21/16 1700)     Lab Results  Component Value Date   WBC 10.9 (H) 07/22/2016   HGB 8.3 (L) 07/22/2016   HCT 25.8 (L) 07/22/2016   PLT 119 (L) 07/22/2016   GLUCOSE 105 (H) 07/22/2016   CHOL 224 (H) 08/17/2015   TRIG 124.0 08/17/2015   HDL 43.80 08/17/2015   LDLDIRECT 147.6 11/08/2013   LDLCALC 155 (H) 08/17/2015   ALT 20 07/17/2016   AST 16 07/17/2016   NA 134 (L) 07/22/2016   K 4.2 07/22/2016   CL 106 07/22/2016   CREATININE 0.96 07/22/2016   BUN 16 07/22/2016   CO2 25 07/22/2016   TSH 1.645 07/22/2016   INR 1.50 (H) 07/19/2016   HGBA1C 5.8 (H) 07/17/2016   Holding sinus   Currently Foley out one hour ago  Grace Isaac MD  Beeper [phone] Office [phone] 07/22/2016 7:00 PM

## 2016-07-22 NOTE — Consult Note (Signed)
Advanced Heart Failure Team Consult Note  Referring Physician: Dr Servando Snare Primary Physician: Dr Laurann [place] Primary Cardiologist:  Dr Radford Pax  Oncologist: Dr Benay Spice  Reason for Consultation: Heart Failure   HPI:    Mr [name] [name] is a 80 year old with a history of Non Small Cell Ca 1B, BPH, PVCs, multiple skin cancers, and CAD 3V disease admitted for scheduled  CABG.   On 7/21 he had CABG x 4 with intraoperative TEE demonstrating EF down to 15%. Yesterday he was started on midodrine for soft BP. Earlier today milrinone was stopped and dopamine is being weaned. Also received 40 mg IV lasix.   Overall feeling ok.    Review of Systems: [y] = yes, '[ ]'$  = no   General: Weight gain '[ ]'$ ; Weight loss '[ ]'$ ; Anorexia '[ ]'$ ; Fatigue [ Y]; Fever '[ ]'$ ; Chills '[ ]'$ ; Weakness '[ ]'$   Cardiac: Chest pain/pressure '[ ]'$ ; Resting SOB '[ ]'$ ; Exertional SOB '[ ]'$ ; Orthopnea '[ ]'$ ; Pedal Edema [ Y]; Palpitations '[ ]'$ ; Syncope '[ ]'$ ; Presyncope '[ ]'$ ; Paroxysmal nocturnal dyspnea'[ ]'$   Pulmonary: Cough '[ ]'$ ; Wheezing'[ ]'$ ; Hemoptysis'[ ]'$ ; Sputum '[ ]'$ ; Snoring '[ ]'$   GI: Vomiting'[ ]'$ ; Dysphagia'[ ]'$ ; Melena'[ ]'$ ; Hematochezia '[ ]'$ ; Heartburn'[ ]'$ ; Abdominal pain '[ ]'$ ; Constipation '[ ]'$ ; Diarrhea '[ ]'$ ; BRBPR '[ ]'$   GU: Hematuria'[ ]'$ ; Dysuria '[ ]'$ ; Nocturia'[ ]'$   Vascular: Pain in legs with walking '[ ]'$ ; Pain in feet with lying flat '[ ]'$ ; Non-healing sores '[ ]'$ ; Stroke '[ ]'$ ; TIA '[ ]'$ ; Slurred speech '[ ]'$ ;  Neuro: Headaches'[ ]'$ ; Vertigo'[ ]'$ ; Seizures'[ ]'$ ; Paresthesias'[ ]'$ ;Blurred vision '[ ]'$ ; Diplopia '[ ]'$ ; Vision changes '[ ]'$   Ortho/Skin: Arthritis '[ ]'$ ; Joint pain [ Y]; Muscle pain '[ ]'$ ; Joint swelling '[ ]'$ ; Back Pain '[ ]'$ ; Rash '[ ]'$   Psych: Depression'[ ]'$ ; Anxiety'[ ]'$   Heme: Bleeding problems '[ ]'$ ; Clotting disorders '[ ]'$ ; Anemia '[ ]'$   Endocrine: Diabetes '[ ]'$ ; Thyroid dysfunction'[ ]'$   Home Medications Prior to Admission medications   Medication Sig Start Date End Date Taking? Authorizing Provider  aspirin 81 MG tablet Take 81 mg by mouth daily.   Yes Historical Provider, MD  b  complex vitamins tablet Take 1 tablet by mouth daily.   Yes Celene Daily, MD  carvedilol (COREG) 3.125 MG tablet Take 1 tablet (3.125 mg total) by mouth 2 (two) times daily. 08/24/15  Yes Sueanne Margarita, MD  ezetimibe (ZETIA) 10 MG tablet Take 10 mg by mouth daily.   Yes Historical Provider, MD  isosorbide mononitrate (IMDUR) 30 MG 24 hr tablet Take 30 mg by mouth daily.   Yes Historical Provider, MD  Multiple Vitamin (MULTIVITAMIN WITH MINERALS) TABS tablet Take 1 tablet by mouth daily.   Yes Historical Provider, MD  nitroGLYCERIN (NITROSTAT) 0.4 MG SL tablet Place 1 tablet under the tongue every 5 (five) minutes as needed for chest pain.  04/30/16  Yes Historical Provider, MD  Omega 3 1200 MG CAPS Take 1 capsule by mouth 2 (two) times daily.    Yes Historical Provider, MD    Past Medical History: Past Medical History:  Diagnosis Date  . Arthritis    JOINT PAIN RIGHT HAND  . BPH (benign prostatic hyperplasia)    [name]/elevated PSA, prostate biopsy x4 including one saturation biopsy, laser treatment 2/14; NOCTURIA  . CAD (coronary artery disease) 2013   cath 05/2012 showing 80-90% stenosis of a trifurcating diagonal #1, 70-80% stenosis of OM3 and 90% stenosis of distal LCx after  OM3 - medical management, [place]  . Cardiomyopathy ([place])    dilated cardiomyopathy EF 30%, MUGA  EF 42% 08/2012  . Carotid artery occlusion    carotid artery bruit  . Chronic kidney disease    kidney stones -small passed.  . Diastolic dysfunction   . Heart murmur    as a child  . History of shingles   . Hyperlipidemia    statin intolerant  . Hypertension   . Lung mass    Stage 1B non-small cell lung CA s/p resection 2013  . PVC (premature ventricular contraction)   . Shingles   . Sigmoid diverticulosis   . Skin cancer    Multiple skin cancers   . Squamous cell carcinoma, face    history of right face with mets to right upper cheek in 2004  and facial lymph node reoccurence post surgery with XRT  .  Trigger finger    Bilateral    Past Surgical History: Past Surgical History:  Procedure Laterality Date  . APPENDECTOMY    . APPENDECTOMY    . CARDIAC CATHETERIZATION  05/2012   Has blockage- Treated with Medications.  Cardiologist - Dr Radford Pax  . CARDIAC CATHETERIZATION Left 07/10/2016   Procedure: Left Heart Cath and Coronary Angiography;  Surgeon: Corey Skains, MD;  Location: [facility];  Service: Cardiovascular;  Laterality: Left;  . CATARACT EXTRACTION, BILATERAL    . CORONARY ARTERY BYPASS GRAFT N/A 07/19/2016   Procedure: CORONARY ARTERY BYPASS GRAFTING (CABG) x 4 (LIMA to LAD, SVG to DIAGONAL, SVG to CIRCUMFLEX, SVG to PDA) with EVH from [place] and LEFT INTERNAL MAMMARY ARTERY;  Surgeon: Grace Isaac, MD;  Location: [facility];  Service: Open Heart Surgery;  Laterality: N/A;  . excision ot metastatic lymph node followed by radiation  2006   .  Marland Kitchen GREEN LIGHT LASER TURP (TRANSURETHRAL RESECTION OF PROSTATE N/A 02/08/2013   Procedure: GREEN LIGHT LASER TURP (TRANSURETHRAL RESECTION OF PROSTATE;  Surgeon: Ailene Rud, MD;  Location[facility];  Service: Urology;  Laterality: N/A;  . HOLMIUM LASER APPLICATION Left 8/81/1031   Procedure: HOLMIUM LASER APPLICATION;  Surgeon: Ailene Rud, MD;  Location: [facility];  Service: Urology;  Laterality: Left;  . LYMPH NODE DISSECTION  06/16/2012   Procedure: LYMPH NODE DISSECTION;  Surgeon: Grace Isaac, MD;  Location: [facility];  Service: Thoracic;;  . MOHS SURGERY  2004  . RIB PLATING Right 07/19/2016   Procedure: Right sternoclavicular joint plating;  Surgeon: Grace Isaac, MD;  Location: [facility];  Service: Open Heart Surgery;  Laterality: Right;  . TEE WITHOUT CARDIOVERSION N/A 07/19/2016   Procedure: TRANSESOPHAGEAL ECHOCARDIOGRAM (TEE);  Surgeon: Grace Isaac, MD;  Location: [facility];  Service: Open Heart Surgery;  Laterality: N/A;  . TRANSURETHRAL RESECTION OF PROSTATE Left  08/22/2014   Procedure: TRANSURETHRAL RESECTION OF THE PROSTATE (TURP) CYSTOSCOPY, LEFT RETROGRADE PYLEGRAM,LEFT with STENT,URETEROSCOPY,PLACEMENT OF BACKSTOP,LASER FRAGMENTATION WITH BASKET EXTRACTION LEFT URETEROSCOPY;  Surgeon: Ailene Rud, MD;  Location: [facility];  Service: Urology;  Laterality: Left;  Marland Kitchen VIDEO BRONCHOSCOPY  06/16/2012   Procedure: VIDEO BRONCHOSCOPY;  Surgeon: Grace Isaac, MD;  Location: [facility];  Service: Thoracic;  Laterality: N/A;  . VIDEO BRONCHOSCOPY  06/24/2012   Procedure: VIDEO BRONCHOSCOPY;  Surgeon: Grace Isaac, MD;  Location: [facility];  Service: Thoracic;  Laterality: N/A;    Family History: Family History  Problem Relation Age of Onset  . Diabetes Father   .  Psoriasis Child   . Heart disease Child     resuscitated from cardiac arrest from Brugada's syndrome  . Lung cancer Mother   . Heart disease Mother     Social History: Social History   Social History  . Marital status: Married    Spouse name: N/A  . Number of children: N/A  . Years of education: N/A   Social History Main Topics  . Smoking status: Former Smoker    Types: Cigarettes    Quit date: 12/30/1965  . Smokeless tobacco: Never Used  . Alcohol use 3.0 - 3.6 oz/week    3 - 4 Glasses of wine, 2 Cans of beer per week     Comment: infrequently- "indulge heavly when I do"  . Drug use: No  . Sexual activity: Not Asked   Other Topics Concern  . None   Social History Narrative  . None    Allergies:  Allergies  Allergen Reactions  . Ramipril Cough  . Statins     Confusion Muscle pain     Objective:    Vital Signs:   Temp:  [98 F (36.7 C)-99.1 F (37.3 C)] 99 F (37.2 C) (07/24 0424) Pulse Rate:  [78-114] 78 (07/24 0900) Resp:  [14-29] 15 (07/24 0900) BP: (82-122)/(49-84) 104/84 (07/24 0900) SpO2:  [84 %-100 %] 99 % (07/24 0900) Weight:  [177 lb 9.6 oz (80.6 kg)] 177 lb 9.6 oz (80.6 kg) (07/24 0500) Last BM Date:  (PTA)  Weight change: Filed Weights    07/20/16 0500 07/21/16 0500 07/22/16 0500  Weight: 181 lb 14.1 oz (82.5 kg) 179 lb 3.7 oz (81.3 kg) 177 lb 9.6 oz (80.6 kg)    Intake/Output:   Intake/Output Summary (Last 24 hours) at 07/22/16 0958 Last data filed at 07/22/16 0950  Gross per 24 hour  Intake          1560.39 ml  Output             3890 ml  Net         -2329.61 ml     Physical Exam: General:  Well appearing. No resp difficulty. In the chair.  HEENT: normal Neck: supple. JVP 10  . Carotids 2+ bilat; no bruits. No lymphadenopathy or thryomegaly appreciated. RIJ Cor: PMI nondisplaced. Regular rate & rhythm. No rubs, gallops or murmurs. Sternal dressing intact  Lungs: clear Abdomen: soft, nontender, nondistended. No hepatosplenomegaly. No bruits or masses. Good bowel sounds. Extremities: no cyanosis, clubbing, rash, R and LLE 1+ edema Neuro: alert & orientedx3, cranial nerves grossly intact. moves all 4 extremities w/o difficulty. Affect pleasant  Telemetry: NSR with PVCs 70s  Labs: Basic Metabolic Panel:  Recent Labs Lab 07/17/16 1103  07/19/16 2102 07/19/16 2108 07/20/16 0400 07/20/16 1730 07/20/16 1734 07/21/16 0505 07/22/16 0409 07/22/16 0855  NA 139  < > 141  --  136  --  136 133* 134*  --   K 4.5  < > 3.7  --  4.0  --  4.2 4.1 4.2  --   CL 108  < > 105  --  110  --  101 105 106  --   CO2 27  --   --   --  23  --   --  25 25  --   GLUCOSE 89  < > 126*  --  118*  --  142* 146* 105*  --   BUN 25*  < > 15  --  12  --  14 15  16  --   CREATININE 1.02  < > 0.80 0.80 0.80 0.90 0.90 0.84 0.96  --   CALCIUM 9.4  --   --   --  7.1*  --   --  7.7* 7.9*  --   MG  --   --   --  2.8* 2.3 2.2  --   --   --  2.0  < > = values in this interval not displayed.  Liver Function Tests:  Recent Labs Lab 07/17/16 1103  AST 16  ALT 20  ALKPHOS 63  BILITOT 0.9  PROT 6.5  ALBUMIN 3.7   No results for input(s): LIPASE, AMYLASE in the last 168 hours. No results for input(s): AMMONIA in the last 168  hours.  CBC:  Recent Labs Lab 07/19/16 2108 07/20/16 0400 07/20/16 1730 07/20/16 1734 07/21/16 0505 07/22/16 0411  WBC 14.1* 13.9* 14.4*  --  15.1* 10.9*  HGB 10.1* 9.4* 9.2* 8.8* 8.8* 8.3*  HCT 29.5* 28.1* 26.8* 26.0* 26.0* 25.8*  MCV 91.0 91.5 90.5  --  92.2 92.8  PLT 128* 124* 125*  --  123* 119*    Cardiac Enzymes: No results for input(s): CKTOTAL, CKMB, CKMBINDEX, TROPONINI in the last 168 hours.  BNP: BNP (last 3 results) No results for input(s): BNP in the last 8760 hours.  ProBNP (last 3 results) No results for input(s): PROBNP in the last 8760 hours.   CBG:  Recent Labs Lab 07/21/16 1654 07/21/16 1951 07/21/16 2325 07/22/16 0422 07/22/16 0740  GLUCAP 148* 162* 147* 108* 145*    Coagulation Studies:  Recent Labs  07/19/16 1515  LABPROT 18.2*  INR 1.50*    Other results: EKG: NSR 73 BPM on 07/20/16  Imaging: Dg Chest Port 1 View  Result Date: 07/21/2016 CLINICAL DATA:  CABG EXAM: PORTABLE CHEST 1 VIEW COMPARISON:  07/20/2016 FINDINGS: Low lung volumes. Left lung base is obscured. Pulmonary vascular congestion without frank interstitial edema. No pneumothorax. Cardiomegaly.  Postsurgical changes related to prior CABG. Interval removal of right IJ Swan-Ganz catheter. Right IJ venous sheath. Median sternotomy.  Status post ORIF of the medial right clavicle. IMPRESSION: Postsurgical changes related to prior CABG. Interval removal of right IJ Swan-Ganz catheter. No pneumothorax. Electronically Signed   By: Julian Hy M.D.   On: 07/21/2016 11:39     Medications:     Current Medications: . acetaminophen  1,000 mg Oral Q6H   Or  . acetaminophen (TYLENOL) oral liquid 160 mg/5 mL  1,000 mg Per Tube Q6H  . amiodarone  200 mg Oral Q12H   Followed by  . [START ON 07/30/2016] amiodarone  200 mg Oral Daily  . antiseptic oral rinse  7 mL Mouth Rinse BID  . aspirin EC  325 mg Oral Daily   Or  . aspirin  324 mg Per Tube Daily  . B-complex with  vitamin C  1 tablet Oral Daily  . bisacodyl  10 mg Oral Daily   Or  . bisacodyl  10 mg Rectal Daily  . docusate sodium  200 mg Oral Daily  . enoxaparin (LOVENOX) injection  40 mg Subcutaneous QHS  . ezetimibe  10 mg Oral Daily  . insulin aspart  0-24 Units Subcutaneous Q4H  . midodrine  10 mg Oral TID WC  . multivitamin with minerals  1 tablet Oral Daily  . omega-3 acid ethyl esters  1 capsule Oral BID  . pantoprazole  40 mg Oral Daily  . sodium chloride flush  3 mL  Intravenous Q12H     Infusions: . sodium chloride Stopped (07/20/16 1000)  . sodium chloride    . sodium chloride Stopped (07/20/16 1000)  . amiodarone 60 mg/hr (07/22/16 0841)   Followed by  . amiodarone    . DOPamine 2 mcg/kg/min (07/22/16 0900)  . lactated ringers Stopped (07/19/16 1500)  . lactated ringers 20 mL/hr at 07/22/16 0600  . phenylephrine (NEO-SYNEPHRINE) Adult infusion Stopped (07/21/16 1700)      Assessment/Plan  Mr Abdallah is a 80 year old with CAD admitted for scheduled CABG x4 on July 19, 2016   1. S/P CABG x4 on 07/19/16: On aspirin and statin. No BB for now with soft BP.  2. A/C Systolic Heart Failure: Ischemic cardiomyopathy. In the past he was offered ICD however he declined. Intraoperative TEE with reduced EF 15%. Mild volume overload noted.  - Give 40 mg IV lasix twice daily.  - Dopamine currently being weaned off. Continue midodrine 10 mg tid.  - No BB or Ace for now with soft BP.  - Add digoxin 0.125 mg daily.  - Follow CVP and CO-OX off his central line. 3. PVCs: Earlier this morning with ?short burst of Afib 120s. Also having PVCs. Started on amio drip with plan to transition to po 200 mg twice a day tonight. Today TSH was normal.  4. H/O Non Small Cell Lung CA 5. H/O multiple lung cancers.   Length of Stay: 3  Amy Clegg NP-C  07/22/2016, 9:58 AM  Advanced Heart Failure Team Pager [phone] (M-F; 7a - 4p)  Please contact [facility] for night-coverage after hours (4p -7a  ) and weekends on [email or web address]  Patient seen with NP, agree with the above note.  Now s/p CABG.  Ischemic cardiomyopathy with EF 15%, slow wean off inotropes/pressors but now just on dopamine at 1.  He is on midodrine however.  Looks volume overloaded.  - Follow co-ox and CVP. - Will give Lasix 40 mg IV bid. - Add digoxin - Hopefully off dopamine today, BP stable.   Loralie Champagne 07/22/2016 11:48 AM

## 2016-07-22 NOTE — Op Note (Deleted)
  The note originally documented on this encounter has been moved the the encounter in which it belongs.  

## 2016-07-22 NOTE — Progress Notes (Signed)
Patient ID: [name] [name], male   DOB: Nov 08, 1936, 80 y.o.   MRN: [id number] TCTS DAILY ICU PROGRESS NOTE                   [address]            [place] [address]          [phone]   3 Days Post-Op Procedure(s) (LRB): CORONARY ARTERY BYPASS GRAFTING (CABG) x 4 (LIMA to LAD, SVG to DIAGONAL, SVG to CIRCUMFLEX, SVG to PDA) with EVH from [place] and LEFT INTERNAL MAMMARY ARTERY (N/A) TRANSESOPHAGEAL ECHOCARDIOGRAM (TEE) (N/A) Right sternoclavicular joint plating (Right)  Total Length of Stay:  LOS: 3 days   Subjective: Awake , alert and neuro intact, up in chair   Objective: Vital signs in last 24 hours: Temp:  [98 F (36.7 C)-99.1 F (37.3 C)] 99 F (37.2 C) (07/24 0424) Pulse Rate:  [87-114] 114 (07/24 0600) Cardiac Rhythm: Normal sinus rhythm (07/24 0730) Resp:  [14-29] 25 (07/24 0600) BP: (82-122)/(49-87) 101/60 (07/24 0600) SpO2:  [84 %-99 %] 98 % (07/24 0600) Weight:  [177 lb 9.6 oz (80.6 kg)] 177 lb 9.6 oz (80.6 kg) (07/24 0500)  Filed Weights   07/20/16 0500 07/21/16 0500 07/22/16 0500  Weight: 181 lb 14.1 oz (82.5 kg) 179 lb 3.7 oz (81.3 kg) 177 lb 9.6 oz (80.6 kg)    Weight change: -1 lb 10.1 oz (-0.741 kg)   Hemodynamic parameters for last 24 hours:    Intake/Output from previous day: 07/23 0701 - 07/24 0700 In: 1504 [P.O.:720; I.V.:664; IV Piggyback:50] Out: 8921 [Urine:3455]  Intake/Output this shift: No intake/output data recorded.  Current Meds: Scheduled Meds: . acetaminophen  1,000 mg Oral Q6H   Or  . acetaminophen (TYLENOL) oral liquid 160 mg/5 mL  1,000 mg Per Tube Q6H  . antiseptic oral rinse  7 mL Mouth Rinse BID  . aspirin EC  325 mg Oral Daily   Or  . aspirin  324 mg Per Tube Daily  . B-complex with vitamin C  1 tablet Oral Daily  . bisacodyl  10 mg Oral Daily   Or  . bisacodyl  10 mg Rectal Daily  . docusate sodium  200 mg Oral Daily  . enoxaparin (LOVENOX) injection  40 mg Subcutaneous  QHS  . ezetimibe  10 mg Oral Daily  . insulin aspart  0-24 Units Subcutaneous Q4H  . midodrine  10 mg Oral TID WC  . multivitamin with minerals  1 tablet Oral Daily  . omega-3 acid ethyl esters  1 capsule Oral BID  . pantoprazole  40 mg Oral Daily  . sodium chloride flush  3 mL Intravenous Q12H   Continuous Infusions: . sodium chloride Stopped (07/20/16 1000)  . sodium chloride    . sodium chloride Stopped (07/20/16 1000)  . DOPamine 2.975 mcg/kg/min (07/22/16 0600)  . lactated ringers Stopped (07/19/16 1500)  . lactated ringers 20 mL/hr at 07/22/16 0600  . milrinone 0.1 mcg/kg/min (07/22/16 0600)  . phenylephrine (NEO-SYNEPHRINE) Adult infusion Stopped (07/21/16 1700)   PRN Meds:.sodium chloride, lactated ringers, morphine injection, ondansetron (ZOFRAN) IV, oxyCODONE, sodium chloride flush, traMADol  General appearance: alert, cooperative and no distress Neurologic: intact Heart: regular rate and rhythm, S1, S2 normal, no murmur, click, rub or gallop Lungs: clear to auscultation bilaterally Abdomen: soft, non-tender; bowel sounds normal; no masses,  no organomegaly Extremities: extremities normal, atraumatic, no cyanosis or edema and Homans sign is negative, no sign  of DVT Wound: sternum stable  Lab Results: CBC: Recent Labs  07/21/16 0505 07/22/16 0411  WBC 15.1* 10.9*  HGB 8.8* 8.3*  HCT 26.0* 25.8*  PLT 123* 119*   BMET:  Recent Labs  07/21/16 0505 07/22/16 0409  NA 133* 134*  K 4.1 4.2  CL 105 106  CO2 25 25  GLUCOSE 146* 105*  BUN 15 16  CREATININE 0.84 0.96  CALCIUM 7.7* 7.9*    PT/INR:  Recent Labs  07/19/16 1515  LABPROT 18.2*  INR 1.50*   Radiology: No results found.   Assessment/Plan: S/P Procedure(s) (LRB): CORONARY ARTERY BYPASS GRAFTING (CABG) x 4 (LIMA to LAD, SVG to DIAGONAL, SVG to CIRCUMFLEX, SVG to PDA) with EVH from [place] and LEFT INTERNAL MAMMARY ARTERY (N/A) TRANSESOPHAGEAL ECHOCARDIOGRAM (TEE)  (N/A) Right sternoclavicular joint plating (Right) Mobilize Diuresis Diabetes control Expected Acute  Blood - loss Anemia D/C  Milrinone  Wean off dopamine  Will need long follow up with HF service , will ask to see     DIO GILLER 07/22/2016 7:44 AM

## 2016-07-22 NOTE — Op Note (Signed)
[name] [name], [name] [name] NO.:  [id number]  MEDICAL RECORD NO.:  [id number]  LOCATION:                               FACILITY:  [facility]  PHYSICIAN:  Lanelle Bal, MD    DATE OF BIRTH:  05/26/1936  DATE OF PROCEDURE:  07/19/2016 DATE OF DISCHARGE:                              OPERATIVE REPORT   PREOPERATIVE DIAGNOSIS:  Three-vessel coronary artery disease with ischemic cardiomyopathy, ejection fraction 20%.  POSTOPERATIVE DIAGNOSIS:  Three-vessel coronary artery disease with ischemic cardiomyopathy, ejection fraction 20%. Nonunion of the right sternoclavicular joint.  PROCEDURE:  Coronary artery bypass grafting x4 with left internal mammary to the left anterior descending coronary artery, reverse saphenous vein graft to the diagonal coronary artery, reverse saphenous vein graft to the circumflex coronary artery, reverse saphenous vein graft to the posterior descending coronary artery with right thigh and calf greater saphenous endoscopic vein harvesting. Plating of right sternoclavicular joint  SURGEON:  Lanelle Bal, MD.  FIRST ASSISTANT:  Quincy Simmonds, PA.  BRIEF HISTORY:  The patient is a 80 year old male followed by Cardiology for ischemic cardiomyopathy.  Three years previously, the patient had undergone a left upper lobectomy for stage IA squamous cell carcinoma of the lung.  He has done well following this with followup scans including the week prior to surgery without any evidence of recurrent disease in the chest.  Because of increasing symptoms of fatigue and shortness of breath with exertion, the patient saw the Cardiology opinion from Dr. Nehemiah Massed in [place].  Echocardiogram showed decreased LV function, ejection fraction 20%-25%.  Myoview stress test was performed demonstrating ischemic changes and ejection fraction of 20%.  Repeat cardiac catheterization showed progression of coronary disease compared to his previous cardiac cath in  2010.  The patient was referred for consideration of surgery.  Risks and options were discussed with the patient and his wife in detail and he was agreeable with proceeding, and signed informed consent.  DESCRIPTION OF PROCEDURE:  With Swan-Ganz and arterial line monitors in place, the patient underwent general endotracheal anesthesia without incident.  Dr. Glennon Mac placed TED probe.  The patient had no mitral regurgitation or tricuspid regurgitation.  The aortic valve appeared normal.  Overall, ventricular function was decreased as studies preop indicated with especially inferior and septal hypokinesis.  The skin of the chest and legs was prepped with Betadine and draped in usual sterile manner.  Appropriate time-out was performed and we proceeded with greater saphenous vein endo-harvesting including the thigh and calf.  The vein was of good quality.  Median sternotomy was performed. 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  The left internal mammary artery was dissected down as a pedicle graft.  The distal artery was divided, had good free flow.  Pericardium was opened. As noted, the patient had significant enlargement of his left ventricle. He was systemically heparinized.  The ascending aorta was cannulated. The right atrium was cannulated.  An aortic  root vent cardioplegia needle was introduced into the ascending aorta.  The patient was placed on cardiopulmonary bypass 2.4 L/min/m2.  Sites of anastomosis were selected and dissected out of the epicardium.  The patient's body temperature was cooled to 32 degrees.  Aortic crossclamp was applied and 800 mL of cold blood potassium cardioplegia was administered with diastolic  arrest of the heart.  Myocardial septal temperature was monitored throughout the crossclamp.  Attention was turned first to the distal right coronary artery, which was significantly calcified.  The posterior descending was the only reasonable size vessel off the right coronary artery.  This vessel was opened and admitted a 1 mm probe distally.  Using a running 7-0 Prolene, distal anastomosis was performed with segment of reverse saphenous vein graft.  The heart was then elevated and the very large circumflex branch was identified and opened and admitted a 1.5 mm probe distally.  Using a running 7-0 Prolene, distal anastomosis was performed. Additional cold blood cardioplegia was administered down the vein grafts.  The largest diagonal branch was identified and was opened, admitted a 1.5 mm probe.  Using a running 7-0 Prolene, the segment of reverse saphenous vein graft was anastomosed.  Attention was then turned to the left anterior descending coronary artery, which was a slightly smaller vessel but did admit approximate 1.3 mm in size.  Using a running 8-0 Prolene, left internal mammary artery was anastomosed to the left anterior descending coronary artery. With the crossclamp still in place, three punch aortotomies were performed and each of three vein grafts were anastomosed to the ascending aorta.  The bulldog was removed from the mammary artery with prompt rise in myocardial septal temperature.  Aortic crossclamp was removed with a total crossclamp time of 105 minutes.  The vein grafts and the ascending aorta had been de-aired.  The patient required electric defibrillation return to a sinus rhythm.  He was transiently AV paced.  We started on Milrinone and dopamine infusion and low-dose epinephrine with the body temperature rewarmed to 37 degrees.  He was then ventilated and weaned from cardiopulmonary bypass.  He remained hemodynamically stable.  He was decannulated in usual  fashion. Protamine sulfate was administered with operative field hemostatic. Atrial and ventricular wired were applied.  Two Blake mediastinal drains were left in place.  The left pleural space was somewhat obliterated from his previous surgery.  The pericardium was loosely reapproximated. With the area of nonunion of the right sternoclavicular joint we decided to place a small sternal plate across this junction to prevent chronic movement and discomfort and flat sternal plate was trimmed to the appropriate length #10 screws were selected after measuring the depth of the sternoclavicular joint .3 holes were drilled for the plate into the fairly medial convert clavicle and the plate secured. The plate was then pressed against the right side of the sternum and 2 screws were used to attach the plate to the manubrium . This provided a stable closure . We then proceeded with standard closure of the sternum .The sternum was closed with #6 stainless steel wire.  Fascia was closed with interrupted 0 Vicryl, running 3-0 Vicryl subcutaneous tissue, 4-0 subcuticular stitch in skin edges.  Dry dressings were applied.  Sponge and needle count was reported as correct at the completion of the procedure with the exception of 1 8-0 needle  x-ray to be obtained in the Surgical Intensive care Unit as the small needle would not be apparent on x-ray.  At the completion of  the case, there had been some improvement in overall LV function, especially in the septal and inferior wall.  The patient did not require any blood bank blood products during the operative procedure.  Total pump time was 140 minutes.     Lanelle Bal, MD     EG/MEDQ  D:  07/22/2016  T:  07/22/2016  Job:  [id number]  cc:   Serafina Royals, MD

## 2016-07-22 NOTE — Anesthesia Postprocedure Evaluation (Signed)
Anesthesia Post Note  Patient: [name] [name]  Procedure(s) Performed: Procedure(s) (LRB): CORONARY ARTERY BYPASS GRAFTING (CABG) x 4 (LIMA to LAD, SVG to DIAGONAL, SVG to CIRCUMFLEX, SVG to PDA) with EVH from [place] and LEFT INTERNAL MAMMARY ARTERY (N/A) TRANSESOPHAGEAL ECHOCARDIOGRAM (TEE) (N/A) Right sternoclavicular joint plating (Right)  Patient location during evaluation: SICU Anesthesia Type: General Level of consciousness: awake and alert, oriented and patient cooperative Pain management: pain level controlled Vital Signs Assessment: post-procedure vital signs reviewed and stable Respiratory status: spontaneous breathing, nonlabored ventilation, respiratory function stable and patient connected to nasal cannula oxygen Cardiovascular status: blood pressure returned to baseline and stable Postop Assessment: no signs of nausea or vomiting Anesthetic complications: no    Last Vitals:  Vitals:   07/22/16 1537 07/22/16 1600  BP:  (!) 120/57  Pulse:  68  Resp:  (!) 22  Temp: 37 C     Last Pain:  Vitals:   07/22/16 1600  TempSrc:   PainSc: 5                  Correne Lalani,E. Khandi Kernes

## 2016-07-22 NOTE — Progress Notes (Signed)
  Amiodarone Drug - Drug Interaction Consult Note  Recommendations: Will monitor carefully.  Amiodarone is metabolized by the cytochrome P450 system and therefore has the potential to cause many drug interactions. Amiodarone has an average plasma half-life of 50 days (range 20 to 100 days).   There is potential for drug interactions to occur several weeks or months after stopping treatment and the onset of drug interactions may be slow after initiating amiodarone.   '[]'$  Statins: Increased risk of myopathy. Simvastatin- restrict dose to '20mg'$  daily. Other statins: counsel patients to report any muscle pain or weakness immediately.  '[]'$  Anticoagulants: Amiodarone can increase anticoagulant effect. Consider warfarin dose reduction. Patients should be monitored closely and the dose of anticoagulant altered accordingly, remembering that amiodarone levels take several weeks to stabilize.  '[]'$  Antiepileptics: Amiodarone can increase plasma concentration of phenytoin, the dose should be reduced. Note that small changes in phenytoin dose can result in large changes in levels. Monitor patient and counsel on signs of toxicity.  '[]'$  Beta blockers: increased risk of bradycardia, AV block and myocardial depression. Sotalol - avoid concomitant use.  '[]'$   Calcium channel blockers (diltiazem and verapamil): increased risk of bradycardia, AV block and myocardial depression.  '[]'$   Cyclosporine: Amiodarone increases levels of cyclosporine. Reduced dose of cyclosporine is recommended.  '[x]'$  Digoxin dose should be halved when amiodarone is started.  '[x]'$  Diuretics: increased risk of cardiotoxicity if hypokalemia occurs.  '[]'$  Oral hypoglycemic agents (glyburide, glipizide, glimepiride): increased risk of hypoglycemia. Patient's glucose levels should be monitored closely when initiating amiodarone therapy.   '[]'$  Drugs that prolong the QT interval:  Torsades de pointes risk may be increased with concurrent use - avoid if  possible.  Monitor QTc, also keep magnesium/potassium WNL if concurrent therapy can't be avoided. Marland Kitchen Antibiotics: e.g. fluoroquinolones, erythromycin. . Antiarrhythmics: e.g. quinidine, procainamide, disopyramide, sotalol. . Antipsychotics: e.g. phenothiazines, haloperidol.  . Lithium, tricyclic antidepressants, and methadone. Thank You,  Pat Patrick  07/22/2016 3:31 PM

## 2016-07-23 ENCOUNTER — Inpatient Hospital Stay (HOSPITAL_COMMUNITY): Payer: PPO

## 2016-07-23 LAB — GLUCOSE, CAPILLARY
Glucose-Capillary: 105 mg/dL — ABNORMAL HIGH (ref 65–99)
Glucose-Capillary: 112 mg/dL — ABNORMAL HIGH (ref 65–99)
Glucose-Capillary: 116 mg/dL — ABNORMAL HIGH (ref 65–99)
Glucose-Capillary: 125 mg/dL — ABNORMAL HIGH (ref 65–99)
Glucose-Capillary: 153 mg/dL — ABNORMAL HIGH (ref 65–99)
Glucose-Capillary: 179 mg/dL — ABNORMAL HIGH (ref 65–99)

## 2016-07-23 LAB — MAGNESIUM: Magnesium: 2 mg/dL (ref 1.7–2.4)

## 2016-07-23 MED ORDER — FUROSEMIDE 40 MG PO TABS: 40.0000 mg | ORAL_TABLET | Freq: Every day | ORAL | Status: DC

## 2016-07-23 MED ORDER — CARVEDILOL 3.125 MG PO TABS
3.1250 mg | ORAL_TABLET | Freq: Two times a day (BID) | ORAL | Status: DC
  Administered 2016-07-23 – 2016-07-29 (×11): 3.125 mg via ORAL
  Filled 2016-07-23 (×14): qty 1

## 2016-07-23 MED ORDER — PANTOPRAZOLE SODIUM 40 MG PO TBEC
40.0000 mg | DELAYED_RELEASE_TABLET | Freq: Every day | ORAL | Status: DC
  Administered 2016-07-23 – 2016-07-29 (×7): 40 mg via ORAL
  Filled 2016-07-23 (×7): qty 1

## 2016-07-23 MED ORDER — ONDANSETRON HCL 4 MG PO TABS: 4.0000 mg | ORAL_TABLET | Freq: Four times a day (QID) | ORAL | Status: DC | PRN

## 2016-07-23 MED ORDER — DIGOXIN 125 MCG PO TABS
0.1250 mg | ORAL_TABLET | Freq: Every day | ORAL | Status: DC
  Administered 2016-07-23 – 2016-07-29 (×7): 0.125 mg via ORAL
  Filled 2016-07-23 (×7): qty 1

## 2016-07-23 MED ORDER — SODIUM CHLORIDE 0.9% FLUSH: 3.0000 mL | INTRAVENOUS | Status: DC | PRN

## 2016-07-23 MED ORDER — ACETAMINOPHEN 325 MG PO TABS
650.0000 mg | ORAL_TABLET | Freq: Four times a day (QID) | ORAL | Status: DC | PRN
  Administered 2016-07-23 – 2016-07-29 (×12): 650 mg via ORAL
  Filled 2016-07-23 (×13): qty 2

## 2016-07-23 MED ORDER — OXYCODONE HCL 5 MG PO TABS
5.0000 mg | ORAL_TABLET | ORAL | Status: DC | PRN
  Administered 2016-07-23 – 2016-07-27 (×13): 5 mg via ORAL
  Filled 2016-07-23 (×14): qty 1

## 2016-07-23 MED ORDER — BISACODYL 5 MG PO TBEC
10.0000 mg | DELAYED_RELEASE_TABLET | Freq: Every day | ORAL | Status: DC | PRN
  Administered 2016-07-23 – 2016-07-28 (×3): 10 mg via ORAL
  Filled 2016-07-23 (×2): qty 2

## 2016-07-23 MED ORDER — GUAIFENESIN ER 600 MG PO TB12
600.0000 mg | ORAL_TABLET | Freq: Two times a day (BID) | ORAL | Status: DC | PRN
  Administered 2016-07-24: 600 mg via ORAL
  Filled 2016-07-23: qty 1

## 2016-07-23 MED ORDER — SODIUM CHLORIDE 0.9% FLUSH
3.0000 mL | Freq: Two times a day (BID) | INTRAVENOUS | Status: DC
  Administered 2016-07-23 – 2016-07-29 (×11): 3 mL via INTRAVENOUS

## 2016-07-23 MED ORDER — ONDANSETRON HCL 4 MG/2ML IJ SOLN
4.0000 mg | Freq: Four times a day (QID) | INTRAMUSCULAR | Status: DC | PRN
  Administered 2016-07-24: 4 mg via INTRAVENOUS
  Filled 2016-07-23: qty 2

## 2016-07-23 MED ORDER — BISACODYL 10 MG RE SUPP: 10.0000 mg | Freq: Every day | RECTAL | Status: DC | PRN

## 2016-07-23 MED ORDER — SODIUM CHLORIDE 0.9 % IV SOLN: 250.0000 mL | INTRAVENOUS | Status: DC | PRN

## 2016-07-23 MED ORDER — ASPIRIN EC 81 MG PO TBEC
81.0000 mg | DELAYED_RELEASE_TABLET | Freq: Every day | ORAL | Status: DC
Start: 2016-07-23 — End: 2016-07-29
  Administered 2016-07-23 – 2016-07-29 (×7): 81 mg via ORAL
  Filled 2016-07-23 (×7): qty 1

## 2016-07-23 MED ORDER — DOCUSATE SODIUM 100 MG PO CAPS
200.0000 mg | ORAL_CAPSULE | Freq: Every day | ORAL | Status: DC
  Administered 2016-07-23 – 2016-07-28 (×6): 200 mg via ORAL
  Filled 2016-07-23 (×7): qty 2

## 2016-07-23 MED ORDER — TRAMADOL HCL 50 MG PO TABS
50.0000 mg | ORAL_TABLET | ORAL | Status: DC | PRN
  Administered 2016-07-25 – 2016-07-27 (×2): 100 mg via ORAL
  Filled 2016-07-23 (×2): qty 2

## 2016-07-23 MED ORDER — MOVING RIGHT ALONG BOOK
Freq: Once | Status: AC
  Administered 2016-07-23: 08:00:00
  Filled 2016-07-23: qty 1

## 2016-07-23 MED ORDER — FUROSEMIDE 10 MG/ML IJ SOLN
40.0000 mg | Freq: Two times a day (BID) | INTRAMUSCULAR | Status: DC
  Administered 2016-07-23 – 2016-07-24 (×3): 40 mg via INTRAVENOUS
  Filled 2016-07-23 (×3): qty 4

## 2016-07-23 MED ORDER — INSULIN ASPART 100 UNIT/ML ~~LOC~~ SOLN
0.0000 [IU] | Freq: Three times a day (TID) | SUBCUTANEOUS | Status: DC
  Administered 2016-07-23: 2 [IU] via SUBCUTANEOUS
  Administered 2016-07-23: 4 [IU] via SUBCUTANEOUS

## 2016-07-23 MED ORDER — POTASSIUM CHLORIDE CRYS ER 10 MEQ PO TBCR
10.0000 meq | EXTENDED_RELEASE_TABLET | Freq: Every day | ORAL | Status: DC
  Administered 2016-07-23: 10 meq via ORAL
  Filled 2016-07-23: qty 1

## 2016-07-23 NOTE — Progress Notes (Signed)
Patient ID: [name] [name], male   DOB: 10-10-1936, 80 y.o.   MRN: [id number] TCTS DAILY ICU PROGRESS NOTE                   [address]            [place] [address]          [phone]   4 Days Post-Op Procedure(s) (LRB): CORONARY ARTERY BYPASS GRAFTING (CABG) x 4 (LIMA to LAD, SVG to DIAGONAL, SVG to CIRCUMFLEX, SVG to PDA) with EVH from [place] and LEFT INTERNAL MAMMARY ARTERY (N/A) TRANSESOPHAGEAL ECHOCARDIOGRAM (TEE) (N/A) Right sternoclavicular joint plating (Right)  Total Length of Stay:  LOS: 4 days   Subjective: Alert stable, voiding ok  Objective: Vital signs in last 24 hours: Temp:  [98 F (36.7 C)-99 F (37.2 C)] 98.6 F (37 C) (07/25 0400) Pulse Rate:  [65-111] 70 (07/25 0700) Cardiac Rhythm: Normal sinus rhythm (07/25 0400) Resp:  [12-27] 18 (07/25 0700) BP: (97-160)/(54-84) 128/70 (07/25 0600) SpO2:  [92 %-100 %] 100 % (07/25 0700) Weight:  [178 lb 5.6 oz (80.9 kg)] 178 lb 5.6 oz (80.9 kg) (07/25 0459)  Filed Weights   07/21/16 0500 07/22/16 0500 07/23/16 0459  Weight: 179 lb 3.7 oz (81.3 kg) 177 lb 9.6 oz (80.6 kg) 178 lb 5.6 oz (80.9 kg)    Weight change: 12 oz (0.341 kg)   Hemodynamic parameters for last 24 hours: CVP:  [12 mmHg-17 mmHg] 13 mmHg  Intake/Output from previous day: 07/24 0701 - 07/25 0700 In: 1317 [P.O.:440; I.V.:847] Out: 2385 [Urine:2385]  Intake/Output this shift: No intake/output data recorded.  Current Meds: Scheduled Meds: . acetaminophen  1,000 mg Oral Q6H   Or  . acetaminophen (TYLENOL) oral liquid 160 mg/5 mL  1,000 mg Per Tube Q6H  . amiodarone  200 mg Oral Q12H   Followed by  . [START ON 07/30/2016] amiodarone  200 mg Oral Daily  . aspirin EC  325 mg Oral Daily   Or  . aspirin  324 mg Per Tube Daily  . B-complex with vitamin C  1 tablet Oral Daily  . bisacodyl  10 mg Oral Daily   Or  . bisacodyl  10 mg Rectal Daily  . digoxin  0.125 mg Oral Daily  . docusate sodium  200  mg Oral Daily  . enoxaparin (LOVENOX) injection  40 mg Subcutaneous QHS  . ezetimibe  10 mg Oral Daily  . furosemide  40 mg Intravenous BID  . insulin aspart  0-24 Units Subcutaneous Q4H  . midodrine  10 mg Oral TID WC  . multivitamin with minerals  1 tablet Oral Daily  . omega-3 acid ethyl esters  1 capsule Oral BID  . pantoprazole  40 mg Oral Daily  . sodium chloride flush  3 mL Intravenous Q12H   Continuous Infusions: . sodium chloride Stopped (07/20/16 1000)  . sodium chloride    . sodium chloride Stopped (07/20/16 1000)  . amiodarone Stopped (07/23/16 0000)  . lactated ringers Stopped (07/19/16 1500)  . lactated ringers Stopped (07/23/16 0400)  . phenylephrine (NEO-SYNEPHRINE) Adult infusion Stopped (07/21/16 1700)   PRN Meds:.sodium chloride, lactated ringers, morphine injection, ondansetron (ZOFRAN) IV, oxyCODONE, sodium chloride flush, traMADol  General appearance: alert and cooperative Neurologic: intact Heart: regular rate and rhythm, S1, S2 normal, no murmur, click, rub or gallop Lungs: clear to auscultation bilaterally Abdomen: soft, non-tender; bowel sounds normal; no masses,  no organomegaly Extremities: extremities normal, atraumatic,  no cyanosis or edema and Homans sign is negative, no sign of DVT Wound: sternum stable  Lab Results: CBC: Recent Labs  07/21/16 0505 07/22/16 0411  WBC 15.1* 10.9*  HGB 8.8* 8.3*  HCT 26.0* 25.8*  PLT 123* 119*   BMET:  Recent Labs  07/21/16 0505 07/22/16 0409  NA 133* 134*  K 4.1 4.2  CL 105 106  CO2 25 25  GLUCOSE 146* 105*  BUN 15 16  CREATININE 0.84 0.96  CALCIUM 7.7* 7.9*    CMET: Lab Results  Component Value Date   WBC 10.9 (H) 07/22/2016   HGB 8.3 (L) 07/22/2016   HCT 25.8 (L) 07/22/2016   PLT 119 (L) 07/22/2016   GLUCOSE 105 (H) 07/22/2016   CHOL 224 (H) 08/17/2015   TRIG 124.0 08/17/2015   HDL 43.80 08/17/2015   LDLDIRECT 147.6 11/08/2013   LDLCALC 155 (H) 08/17/2015   ALT 20 07/17/2016   AST  16 07/17/2016   NA 134 (L) 07/22/2016   K 4.2 07/22/2016   CL 106 07/22/2016   CREATININE 0.96 07/22/2016   BUN 16 07/22/2016   CO2 25 07/22/2016   TSH 1.645 07/22/2016   INR 1.50 (H) 07/19/2016   HGBA1C 5.8 (H) 07/17/2016    PT/INR: No results for input(s): LABPROT, INR in the last 72 hours. Radiology: Dg Chest Port 1 View  Result Date: 07/23/2016 CLINICAL DATA:  CABG . sore chest.  Chest tube removal. EXAM: PORTABLE CHEST 1 VIEW COMPARISON:  07/21/2016. FINDINGS: Interim removal of right IJ sheath. Prior CABG. Cardiomegaly with bilateral pulmonary interstitial prominence consistent with congestive heart failure with interstitial edema. Small left pleural effusion. No pneumothorax . IMPRESSION: 1. Interim removal right IJ sheath. 2. Prior CABG. Congestive heart failure with pulmonary interstitial edema . Electronically Signed   By: Marcello Moores  Register   On: 07/23/2016 07:31    Assessment/Plan: S/P Procedure(s) (LRB): CORONARY ARTERY BYPASS GRAFTING (CABG) x 4 (LIMA to LAD, SVG to DIAGONAL, SVG to CIRCUMFLEX, SVG to PDA) with EVH from [place] and LEFT INTERNAL MAMMARY ARTERY (N/A) TRANSESOPHAGEAL ECHOCARDIOGRAM (TEE) (N/A) Right sternoclavicular joint plating (Right) Mobilize Diuresis Plan for transfer to step-down: see transfer orders     TAYVIAN HOLYCROSS 07/23/2016 7:37 AM

## 2016-07-23 NOTE — Progress Notes (Signed)
Advanced Heart Failure Rounding Note   Subjective:    Yesterday dopamine stopped. Earlier today midodrine and digoxin stopped. Weight up 1 pound. Central line removed earlier today.   Denies SOB/Orthopnea/PND   Objective:   Weight Range:  Vital Signs:   Temp:  [98 F (36.7 C)-98.8 F (37.1 C)] 98.1 F (36.7 C) (07/25 0738) Pulse Rate:  [65-111] 70 (07/25 0800) Resp:  [12-27] 19 (07/25 0800) BP: (97-160)/(54-84) 122/55 (07/25 0800) SpO2:  [92 %-100 %] 100 % (07/25 0800) Weight:  [178 lb 5.6 oz (80.9 kg)] 178 lb 5.6 oz (80.9 kg) (07/25 0459) Last BM Date:  (PTA)  Weight change: Filed Weights   07/21/16 0500 07/22/16 0500 07/23/16 0459  Weight: 179 lb 3.7 oz (81.3 kg) 177 lb 9.6 oz (80.6 kg) 178 lb 5.6 oz (80.9 kg)    Intake/Output:   Intake/Output Summary (Last 24 hours) at 07/23/16 0843 Last data filed at 07/23/16 0600  Gross per 24 hour  Intake          1312.15 ml  Output             2325 ml  Net         -1012.85 ml     Physical Exam: General:  Well appearing. No resp difficulty. In the chair.  HEENT: normal Neck: supple. JVP 12 cm. Carotids 2+ bilat; no bruits. No lymphadenopathy or thryomegaly appreciated. Cor: PMI nondisplaced. Regular rate & rhythm. No rubs, gallops or murmurs. Lungs: clear Abdomen: soft, nontender, nondistended. No hepatosplenomegaly. No bruits or masses. Good bowel sounds. Extremities: no cyanosis, clubbing, rash, edema Neuro: alert & orientedx3, cranial nerves grossly intact. moves all 4 extremities w/o difficulty. Affect pleasant  Telemetry: NSR 70s   Labs: Basic Metabolic Panel:  Recent Labs Lab 07/17/16 1103  07/19/16 2102 07/19/16 2108 07/20/16 0400 07/20/16 1730 07/20/16 1734 07/21/16 0505 07/22/16 0409 07/22/16 0855  NA 139  < > 141  --  136  --  136 133* 134*  --   K 4.5  < > 3.7  --  4.0  --  4.2 4.1 4.2  --   CL 108  < > 105  --  110  --  101 105 106  --   CO2 27  --   --   --  23  --   --  25 25  --     GLUCOSE 89  < > 126*  --  118*  --  142* 146* 105*  --   BUN 25*  < > 15  --  12  --  '14 15 16  '$ --   CREATININE 1.02  < > 0.80 0.80 0.80 0.90 0.90 0.84 0.96  --   CALCIUM 9.4  --   --   --  7.1*  --   --  7.7* 7.9*  --   MG  --   --   --  2.8* 2.3 2.2  --   --   --  2.0  < > = values in this interval not displayed.  Liver Function Tests:  Recent Labs Lab 07/17/16 1103  AST 16  ALT 20  ALKPHOS 63  BILITOT 0.9  PROT 6.5  ALBUMIN 3.7   No results for input(s): LIPASE, AMYLASE in the last 168 hours. No results for input(s): AMMONIA in the last 168 hours.  CBC:  Recent Labs Lab 07/19/16 2108 07/20/16 0400 07/20/16 1730 07/20/16 1734 07/21/16 0505 07/22/16 0411  WBC 14.1* 13.9* 14.4*  --  15.1* 10.9*  HGB 10.1* 9.4* 9.2* 8.8* 8.8* 8.3*  HCT 29.5* 28.1* 26.8* 26.0* 26.0* 25.8*  MCV 91.0 91.5 90.5  --  92.2 92.8  PLT 128* 124* 125*  --  123* 119*    Cardiac Enzymes: No results for input(s): CKTOTAL, CKMB, CKMBINDEX, TROPONINI in the last 168 hours.  BNP: BNP (last 3 results) No results for input(s): BNP in the last 8760 hours.  ProBNP (last 3 results) No results for input(s): PROBNP in the last 8760 hours.    Other results:  Imaging: Dg Chest Port 1 View  Result Date: 07/23/2016 CLINICAL DATA:  CABG . sore chest.  Chest tube removal. EXAM: PORTABLE CHEST 1 VIEW COMPARISON:  07/21/2016. FINDINGS: Interim removal of right IJ sheath. Prior CABG. Cardiomegaly with bilateral pulmonary interstitial prominence consistent with congestive heart failure with interstitial edema. Small left pleural effusion. No pneumothorax . IMPRESSION: 1. Interim removal right IJ sheath. 2. Prior CABG. Congestive heart failure with pulmonary interstitial edema . Electronically Signed   By: Marcello Moores  Register   On: 07/23/2016 07:31     Medications:     Scheduled Medications: . amiodarone  200 mg Oral Q12H   Followed by  . [START ON 07/30/2016] amiodarone  200 mg Oral Daily  . aspirin EC   81 mg Oral Daily  . B-complex with vitamin C  1 tablet Oral Daily  . carvedilol  3.125 mg Oral BID WC  . docusate sodium  200 mg Oral Daily  . enoxaparin (LOVENOX) injection  40 mg Subcutaneous QHS  . ezetimibe  10 mg Oral Daily  . furosemide  40 mg Oral Daily  . insulin aspart  0-24 Units Subcutaneous TID AC & HS  . moving right along book   Does not apply Once  . multivitamin with minerals  1 tablet Oral Daily  . omega-3 acid ethyl esters  1 capsule Oral BID  . pantoprazole  40 mg Oral QAC breakfast  . potassium chloride  10 mEq Oral Daily  . sodium chloride flush  3 mL Intravenous Q12H     Infusions:     PRN Medications:  sodium chloride, acetaminophen, bisacodyl **OR** bisacodyl, guaiFENesin, ondansetron **OR** ondansetron (ZOFRAN) IV, oxyCODONE, sodium chloride flush, traMADol   Assessment/Plan    1. S/P CABG x4 on 07/19/16: On aspirin and statin. No BB for now with soft BP.  2. A/C Systolic Heart Failure: Ischemic cardiomyopathy. In the past he was offered ICD however he declined. Intraoperative TEE with reduced EF 15%.  He is still volume overloaded on exam and weight is up a lot from baseline.  He is now off midodrine.  -  IV lasix stopped earlier today transitioned 40 mg po lasix daily.   - Dopamine currently being weaned off. Midodrine was stopped earlier today.   - Carvedilol added this morning. 3.125 mg twice a day.  - Continue digoxin.  - No ARB for now. Allergy to Ace-->cough. Consider adding tomorrow.   - BMET pending.  3. PVCs: Prior in hospital stay ?short burst of Afib 120s. Also having PVCs. Started on amio drip and transitioned to amio 200 mg daily.  TSH was normal.  4. H/O Non Small Cell Lung CA 5. H/O multiple lung cancers.  Length of Stay: [place] NP-C  07/23/2016, 8:43 AM  Advanced Heart Failure Team Pager [phone] (M-F; [place])  Please contact [facility] for night-coverage after hours (4p -7a ) and weekends on [email or web address]  Patient seen  with  NP, agree with the above note.  He diuresed some yesterday on IV Lasix.  He is now off midodrine.   Still volume overloaded on exam and weight is up a lot from baseline.  Awaiting BMET today, but would continue Lasix 40 mg IV bid.   He is now on Coreg at low dose.  Continue digoxin 0.125 daily.  Will work on adding low dose losartan and spironolactone over the next day or two, need to follow BMET and BP (BP stable today).   Loralie Champagne 07/23/2016 9:05 AM

## 2016-07-23 NOTE — Progress Notes (Signed)
CARDIAC REHAB PHASE I   PRE:  Rate/Rhythm: 84 SR with PVCs    BP: sitting 116/55    SaO2: 98 RA  MODE:  Ambulation: 250 ft   POST:  Rate/Rhythm: 95 SR    BP: sitting 132/72     SaO2: 95 RA  Pt rigid with getting out of bed. Discussed correct mechanics. Pt incontinent of urine upon standing.  To BSC then ambulated in hall. DOE with distance, rest x1. To recliner after walk with numerous pillows for more comfort. Pt overall doing well. [place], ACSM 07/23/2016 2:44 PM

## 2016-07-23 NOTE — Care Management Important Message (Signed)
Important Message  Patient Details  Name: [name] [name] MRN: [id number] Date of Birth: [birth year]   Medicare Important Message Given:  Yes    Nathen May 07/23/2016, 10:33 AM

## 2016-07-24 ENCOUNTER — Inpatient Hospital Stay (HOSPITAL_COMMUNITY): Payer: PPO

## 2016-07-24 ENCOUNTER — Other Ambulatory Visit: Payer: Self-pay | Admitting: *Deleted

## 2016-07-24 LAB — CBC
HCT: 26.9 % — ABNORMAL LOW (ref 39.0–52.0)
Hemoglobin: 8.7 g/dL — ABNORMAL LOW (ref 13.0–17.0)
MCH: 30.2 pg (ref 26.0–34.0)
MCHC: 32.3 g/dL (ref 30.0–36.0)
MCV: 93.4 fL (ref 78.0–100.0)
Platelets: 190 10*3/uL (ref 150–400)
RBC: 2.88 MIL/uL — ABNORMAL LOW (ref 4.22–5.81)
RDW: 13.1 % (ref 11.5–15.5)
WBC: 7.2 10*3/uL (ref 4.0–10.5)

## 2016-07-24 LAB — BASIC METABOLIC PANEL
Anion gap: 6 (ref 5–15)
BUN: 19 mg/dL (ref 6–20)
CO2: 28 mmol/L (ref 22–32)
Calcium: 8.3 mg/dL — ABNORMAL LOW (ref 8.9–10.3)
Chloride: 101 mmol/L (ref 101–111)
Creatinine, Ser: 0.99 mg/dL (ref 0.61–1.24)
GFR calc Af Amer: >60 mL/min (ref >60)
GFR calc non Af Amer: >60 mL/min (ref >60)
Glucose, Bld: 113 mg/dL — ABNORMAL HIGH (ref 65–99)
Potassium: 3.7 mmol/L (ref 3.5–5.1)
Sodium: 135 mmol/L (ref 135–145)

## 2016-07-24 LAB — GLUCOSE, CAPILLARY: Glucose-Capillary: 115 mg/dL — ABNORMAL HIGH (ref 65–99)

## 2016-07-24 MED ORDER — POTASSIUM CHLORIDE CRYS ER 20 MEQ PO TBCR
20.0000 meq | EXTENDED_RELEASE_TABLET | Freq: Every day | ORAL | Status: DC
  Administered 2016-07-24 – 2016-07-29 (×6): 20 meq via ORAL
  Filled 2016-07-24 (×6): qty 1

## 2016-07-24 MED ORDER — FUROSEMIDE 10 MG/ML IJ SOLN
60.0000 mg | Freq: Two times a day (BID) | INTRAMUSCULAR | Status: DC
  Administered 2016-07-24 – 2016-07-26 (×5): 60 mg via INTRAVENOUS
  Filled 2016-07-24 (×6): qty 6

## 2016-07-24 MED ORDER — SPIRONOLACTONE 25 MG PO TABS
12.5000 mg | ORAL_TABLET | Freq: Every day | ORAL | Status: DC
  Administered 2016-07-24: 12.5 mg via ORAL
  Filled 2016-07-24: qty 1

## 2016-07-24 MED ORDER — POTASSIUM CHLORIDE CRYS ER 20 MEQ PO TBCR
20.0000 meq | EXTENDED_RELEASE_TABLET | Freq: Once | ORAL | Status: AC
  Administered 2016-07-24: 20 meq via ORAL
  Filled 2016-07-24: qty 1

## 2016-07-24 MED ORDER — GUAIFENESIN ER 600 MG PO TB12
600.0000 mg | ORAL_TABLET | Freq: Two times a day (BID) | ORAL | Status: DC
  Administered 2016-07-24 – 2016-07-29 (×11): 600 mg via ORAL
  Filled 2016-07-24 (×11): qty 1

## 2016-07-24 MED ORDER — LOSARTAN POTASSIUM 25 MG PO TABS
12.5000 mg | ORAL_TABLET | Freq: Every day | ORAL | Status: DC
  Administered 2016-07-24 – 2016-07-25 (×2): 12.5 mg via ORAL
  Filled 2016-07-24: qty 1

## 2016-07-24 MED ORDER — LACTULOSE 10 GM/15ML PO SOLN
20.0000 g | Freq: Once | ORAL | Status: AC
  Administered 2016-07-24: 20 g via ORAL
  Filled 2016-07-24: qty 30

## 2016-07-24 NOTE — Progress Notes (Signed)
Pt ambulating hall with wife.

## 2016-07-24 NOTE — Discharge Summary (Signed)
Physician Discharge Summary       [address]       [place] [address]             [phone]    Patient ID: CORT DRAGOO MRN: [id number] DOB/AGE: 1936/04/07 80 y.o.  Admit date: 07/19/2016 Discharge date: 07/29/2016  Admission Diagnoses: 1. Multivessel CAD 2. Cardiomyopathy ([place])  Active Diagnoses:  1. Tobacco abuse 2. Hypertension 3. Hyperlipidemia-statin intolerant 4. Diastolic dysfunction 5. Lung mass-Stage 1B non-small cell lung CA s/p resection 2013 6. BPH (benign prostatic hyperplasia) 7. PVC (premature ventricular contraction) 8. Trigger finger 9. Chronic kidney disease 10.Skin cancer 11. Carotid artery occlusion 12. Sigmoid diverticulosis 13. ABL anemia   Consults: Heart failure  Procedure (s):  Coronary artery bypass grafting x4 with left internal mammary to the left anterior descending coronary artery, reverse saphenous vein graft to the diagonal coronary artery, reverse saphenous vein graft to the circumflex coronary artery, reverse saphenous vein graft to the posterior descending coronary artery with right thigh and calf greater saphenous endoscopic vein harvesting. Plating of right sternoclavicular joint by Dr. Servando Snare on 07/19/2016.  History of Presenting Illness: This is a  80 y.o. male is seen in the office on 07/19/2016 for consideration of CABG. He notes increasing fatigue with exertion over past 3-4 months. He sought 2nd opinion from Dr Nehemiah Massed concerning his known CAD, ischemic cardiomyopathy. Myoview stress test and cardiac done. Previous cath done 2013.  Class II, Stage C symptoms of fatigue and SOB, but with significant activity level. Patient noted several days ago cutting down tree and sawing wood and stacking wood.   A high probability stress test with risk factors including high blood pressure and high cholesterol.    CT of chest follow up previous resection of left upper lobe with IB non small cell lung cancer - CT  done on 07/18/2016 no evidence of recurrent lung cancer in the chest. Dr. Servando Snare discussed the need for coronary artery bypass grafting surgery. Potential risks, benefits, and complications of the surgery were discussed with the patient and he agreed to proceed with surgery. Pre operative carotid duplex US showed no significant interna carotid artery stenosis. He was admitted on 07/19/2016 in order to undergo a CABG x 4 and plating of sternoclavicular joint.  Brief Hospital Course:  The patient was extubated the evening of surgery without difficulty. He remained afebrile and hemodynamically stable. He was weaned of Dopamine, Milrinone, and Neo Synephrine drips. Gordy Councilman, a line, chest tubes, and foley were removed early in the post operative course. Coreg was started and titrated accordingly. He went into a fib with RVR. He was put on Amiodarone. He was later put on Digoxin. He converted to sinus rhythm and remained in sinus rhythm.  Heart failure was consulted to assist in management of this patient with a LVEF 15-20%. He was aggressively diuresed. He had ABL anemia. He did not require a post op transfusion. His last H and H was 8.6 and 26. He had mild thrombocytopenia. This did resolve and his last platelet count was up to 190,000.  He was weaned off the insulin drip. The patient's HGA1C pre op was 5.8.  He will need to follow up with his medical doctor for further surveillance of his HGA1C. The patient was felt surgically stable for transfer from the ICU to PCTU for further convalescence on 07/23/2016. He continues to progress with cardiac rehab. He was ambulating on room air. He has been tolerating a diet and  has had a bowel movement. Epicardial pacing wires were removed on 07/24/2016. He did have complaints of having hand issues. Motor and sensory were intact. Chest tube sutures were removed on 07/31. The patient is felt surgically stable for discharge today.   Latest Vital Signs: Blood pressure (!)  114/58, pulse 79, temperature 98.7 F (37.1 C), temperature source Oral, resp. rate 20, height '5\' 3"'$  (1.6 m), weight 160 lb 14.4 oz (73 kg), SpO2 97 %.  Physical Exam: Cardiovascular: RRR, no murmur Pulmonary: Slightly diminished at bases Abdomen: Soft, non tender, protuberant,bowel sounds present. Extremities: Bilateral lower extremity edema; serosanguinous ooze right thigh wound. Wounds: Sternal wound is clean and dry.  No erythema or signs of infection.   Discharge Condition:Stable and discharged to home.  Recent laboratory studies:  Lab Results  Component Value Date   WBC 8.1 07/27/2016   HGB 8.6 (L) 07/27/2016   HCT 26.7 (L) 07/27/2016   MCV 94.0 07/27/2016   PLT 305 07/27/2016   Lab Results  Component Value Date   NA 136 07/27/2016   K 4.1 07/27/2016   CL 95 (L) 07/27/2016   CO2 32 07/27/2016   CREATININE 1.14 07/27/2016   GLUCOSE 116 (H) 07/27/2016     Diagnostic Studies: Dg Chest 2 View  Result Date: 07/24/2016 CLINICAL DATA:  80 year old male status post bypass surgery. EXAM: CHEST  2 VIEW COMPARISON:  Chest radiograph dated 07/23/2016 FINDINGS: There small bilateral pleural effusions. There has been interval improvement of the aeration of the lungs. Left lung base airspace opacity may represent atelectatic changes. Pneumonia is not excluded. Clinical correlation is recommended. No pneumothorax. There is median sternotomy wires and CABG vascular clips. Overall there has been interval decrease in the bone cardiopericardial silhouette. Fixation hardware noted over the medial right clavicle. No acute fracture. IMPRESSION: Overall interval improvement of the aeration of the lungs with small bilateral pleural effusions and left lung base atelectasis versus infiltrate. Interval decrease in the size of the cardiopericardial silhouette. Electronically Signed   By: Anner Crete M.D.   On: 07/24/2016 06:36  Ct Chest Wo Contrast  Result Date: 07/18/2016 CLINICAL DATA:   80 year old male under preoperative evaluation prior to CABG. Former smoker who quit 50 years ago. History of left upper lobe lung cancer status post left upper lobectomy. Followup study. EXAM: CT CHEST WITHOUT CONTRAST TECHNIQUE: Multidetector CT imaging of the chest was performed following the standard protocol without IV contrast. COMPARISON:  Chest CT 12/15/2014. FINDINGS: Cardiovascular: Heart size is mildly enlarged. There is no significant pericardial fluid, thickening or pericardial calcification. There is aortic atherosclerosis, as well as atherosclerosis of the great vessels of the mediastinum and the coronary arteries, including calcified atherosclerotic plaque in the left main, left anterior descending, left circumflex and right coronary arteries. Mediastinum/Nodes: No pathologically enlarged mediastinal or hilar lymph nodes. Please note that accurate exclusion of hilar adenopathy is limited on noncontrast CT scans. Esophagus is unremarkable in appearance. Lungs/Pleura: Status post left upper lobectomy. Compensatory hyperexpansion of the left lower lobe. Small amount of pleural thickening in the base of the left hemithorax (unchanged). No significant pleural fluid. No suspicious appearing pulmonary nodules or masses. No acute consolidative airspace disease. Upper Abdomen: 1 cm low-attenuation lesion in segment 7 of the liver (image 105 of series 3) incompletely characterized on today's noncontrast CT examination, but similar to the prior examination, likely a small cyst. Aortic atherosclerosis. Colonic diverticulosis in the region of the splenic flexure without surrounding inflammatory changes to suggest an acute diverticulitis at  this time. Nonobstructive calculi in the lower pole collecting system of left kidney measuring up to 6 mm. Musculoskeletal: There are no aggressive appearing lytic or blastic lesions noted in the visualized portions of the skeleton. IMPRESSION: 1. Status post left upper  lobectomy. No findings to suggest recurrent or metastatic disease in the thorax. 2. Aortic atherosclerosis, in addition to left main and 3 vessel coronary artery disease. Assessment for potential risk factor modification, dietary therapy or pharmacologic therapy may be warranted, if clinically indicated. 3. Mild cardiomegaly. 4. Nonobstructive calculi in the collecting system of the left kidney measuring up to 6 mm in the lower pole. 5. Colonic diverticulosis. 6. Additional incidental findings, as above. Electronically Signed   By: Vinnie Langton M.D.   On: 07/18/2016 16:38   Discharge Medications:   Medication List    STOP taking these medications   isosorbide mononitrate 30 MG 24 hr tablet Commonly known as:  IMDUR   nitroGLYCERIN 0.4 MG SL tablet Commonly known as:  NITROSTAT     TAKE these medications   acetaminophen 325 MG tablet Commonly known as:  TYLENOL Take 2 tablets (650 mg total) by mouth every 6 (six) hours as needed for mild pain.   amiodarone 200 MG tablet Commonly known as:  PACERONE Take 1 tablet (200 mg total) by mouth daily.   aspirin 81 MG tablet Take 81 mg by mouth daily.   b complex vitamins tablet Take 1 tablet by mouth daily.   carvedilol 3.125 MG tablet Commonly known as:  COREG Take 1 tablet (3.125 mg total) by mouth 2 (two) times daily.   digoxin 0.125 MG tablet Commonly known as:  LANOXIN Take 1 tablet (0.125 mg total) by mouth daily.   ezetimibe 10 MG tablet Commonly known as:  ZETIA Take 10 mg by mouth daily.   furosemide 40 MG tablet Commonly known as:  LASIX Take 1.5 tablets (60 mg total) by mouth 2 (two) times daily.   losartan 25 MG tablet Commonly known as:  COZAAR Take 0.5 tablets (12.5 mg total) by mouth 2 (two) times daily.   multivitamin with minerals Tabs tablet Take 1 tablet by mouth daily.   Omega 3 1200 MG Caps Take 1 capsule by mouth 2 (two) times daily.   potassium chloride SA 20 MEQ tablet Commonly known as:   K-DUR,KLOR-CON Take 1 tablet (20 mEq total) by mouth daily.   spironolactone 25 MG tablet Commonly known as:  ALDACTONE Take 1 tablet (25 mg total) by mouth daily.   traMADol 50 MG tablet Commonly known as:  ULTRAM Take 1 tablet (50 mg total) by mouth every 6 (six) hours as needed for moderate pain.      The patient has been discharged on:   1.Beta Blocker:  Yes [ x  ]                              No   [   ]                              If No, reason:  2.Ace Inhibitor/ARB: Yes [   ]                                     No  [   ]  If No, reason:  3.Statin:   Yes [   ]                  No  [ x  ]                  If No, reason:Allergy  4.S[place]a [place]:  Yes  [ x  ]                  No   [   ]                  If No, reason:  Follow Up Appointments: Follow-up Information    Grace Isaac, MD Follow up on 08/29/2016.   Specialty:  Cardiothoracic Surgery Why:  PA/LAT CXR to be taken (at [facility] which is in the same building as Dr. Everrett Coombe office) on 08/29/2016 at 12:45 pm;Appointment time is at 1:15 pm Contact information: [place] [address] [phone]        Irven Shelling, MD .   Specialty:  Internal Medicine Why:  Call for a follow up for further surveillance of HGA1C 5.8 Contact information: [address] [place] [address] [phone]        Corey Skains, MD .   Specialty:  Internal Medicine Why:  Call for a follow up appointment and also have DIGOXIN level drawn Contact information: [place] [address] [place], MD .   Specialty:  Internal Medicine Contact information: [address] [place] [facility] [place] [address] [phone]        Loralie Champagne, MD Follow up on 08/12/2016.   Specialty:  Cardiology Why:  at 11:30 [address] Contact information: [place] [address] [place] .   Why:  rolling walker with seat Contact information: [address] [place] [address] [phone]        [facility] .   Why:  Physical Therapy and Occupational Therapy Contact information: [address] [place] [address] [phone]           Signed: Lars Pinks MPA-C 07/30/2016, 8:20 AM

## 2016-07-24 NOTE — Progress Notes (Signed)
Advanced Heart Failure Rounding Note   Subjective:    Yesterday he was diuresed with IV lasix. Negative 1 liters but weight down 3 lbs.   Denies SOB/Orthopnea/PND.    Objective:   Weight Range:  Vital Signs:   Temp:  [97.4 F (36.3 C)-99.2 F (37.3 C)] 98 F (36.7 C) (07/26 0400) Pulse Rate:  [77-103] 84 (07/26 0400) Resp:  [16-26] 19 (07/26 0400) BP: (97-197)/(51-79) 133/62 (07/26 0400) SpO2:  [87 %-100 %] 100 % (07/26 0400) FiO2 (%):  [21 %] 21 % (07/25 0925) Weight:  [175 lb 8 oz (79.6 kg)] 175 lb 8 oz (79.6 kg) (07/26 0400) Last BM Date:  (PTA)  Weight change: Filed Weights   07/22/16 0500 07/23/16 0459 07/24/16 0400  Weight: 177 lb 9.6 oz (80.6 kg) 178 lb 5.6 oz (80.9 kg) 175 lb 8 oz (79.6 kg)    Intake/Output:   Intake/Output Summary (Last 24 hours) at 07/24/16 0834 Last data filed at 07/23/16 2329  Gross per 24 hour  Intake              600 ml  Output             1150 ml  Net             -550 ml     Physical Exam: General:  Well appearing. No resp difficulty. In the chair.  HEENT: normal Neck: supple. JVP to jaw. Carotids 2+ bilat; no bruits. No lymphadenopathy or thryomegaly appreciated. Cor: PMI nondisplaced. Regular rate & rhythm. No rubs, gallops or murmurs. Lungs: RML RLL expiratory wheezes Abdomen: soft, nontender, distended. No hepatosplenomegaly. No bruits or masses. Good bowel sounds. Extremities: no cyanosis, clubbing, rash, R and LLE 1+ edema Neuro: alert & orientedx3, cranial nerves grossly intact. moves all 4 extremities w/o difficulty. Affect pleasant  Telemetry: NSR 70s   Labs: Basic Metabolic Panel:  Recent Labs Lab 07/17/16 1103  07/19/16 2108 07/20/16 0400 07/20/16 1730 07/20/16 1734 07/21/16 0505 07/22/16 0409 07/22/16 0855 07/23/16 0913 07/24/16 0342  NA 139  < >  --  136  --  136 133* 134*  --   --  135  K 4.5  < >  --  4.0  --  4.2 4.1 4.2  --   --  3.7  CL 108  < >  --  110  --  101 105 106  --   --  101  CO2  27  --   --  23  --   --  25 25  --   --  28  GLUCOSE 89  < >  --  118*  --  142* 146* 105*  --   --  113*  BUN 25*  < >  --  12  --  '14 15 16  '$ --   --  19  CREATININE 1.02  < > 0.80 0.80 0.90 0.90 0.84 0.96  --   --  0.99  CALCIUM 9.4  --   --  7.1*  --   --  7.7* 7.9*  --   --  8.3*  MG  --   --  2.8* 2.3 2.2  --   --   --  2.0 2.0  --   < > = values in this interval not displayed.  Liver Function Tests:  Recent Labs Lab 07/17/16 1103  AST 16  ALT 20  ALKPHOS 63  BILITOT 0.9  PROT 6.5  ALBUMIN 3.7   No  results for input(s): LIPASE, AMYLASE in the last 168 hours. No results for input(s): AMMONIA in the last 168 hours.  CBC:  Recent Labs Lab 07/20/16 0400 07/20/16 1730 07/20/16 1734 07/21/16 0505 07/22/16 0411 07/24/16 0342  WBC 13.9* 14.4*  --  15.1* 10.9* 7.2  HGB 9.4* 9.2* 8.8* 8.8* 8.3* 8.7*  HCT 28.1* 26.8* 26.0* 26.0* 25.8* 26.9*  MCV 91.5 90.5  --  92.2 92.8 93.4  PLT 124* 125*  --  123* 119* 190    Cardiac Enzymes: No results for input(s): CKTOTAL, CKMB, CKMBINDEX, TROPONINI in the last 168 hours.  BNP: BNP (last 3 results) No results for input(s): BNP in the last 8760 hours.  ProBNP (last 3 results) No results for input(s): PROBNP in the last 8760 hours.    Other results:  Imaging: Dg Chest 2 View  Result Date: 07/24/2016 CLINICAL DATA:  80 year old male status post bypass surgery. EXAM: CHEST  2 VIEW COMPARISON:  Chest radiograph dated 07/23/2016 FINDINGS: There small bilateral pleural effusions. There has been interval improvement of the aeration of the lungs. Left lung base airspace opacity may represent atelectatic changes. Pneumonia is not excluded. Clinical correlation is recommended. No pneumothorax. There is median sternotomy wires and CABG vascular clips. Overall there has been interval decrease in the bone cardiopericardial silhouette. Fixation hardware noted over the medial right clavicle. No acute fracture. IMPRESSION: Overall interval  improvement of the aeration of the lungs with small bilateral pleural effusions and left lung base atelectasis versus infiltrate. Interval decrease in the size of the cardiopericardial silhouette. Electronically Signed   By: Anner Crete M.D.   On: 07/24/2016 06:36  Dg Chest Port 1 View  Result Date: 07/23/2016 CLINICAL DATA:  CABG . sore chest.  Chest tube removal. EXAM: PORTABLE CHEST 1 VIEW COMPARISON:  07/21/2016. FINDINGS: Interim removal of right IJ sheath. Prior CABG. Cardiomegaly with bilateral pulmonary interstitial prominence consistent with congestive heart failure with interstitial edema. Small left pleural effusion. No pneumothorax . IMPRESSION: 1. Interim removal right IJ sheath. 2. Prior CABG. Congestive heart failure with pulmonary interstitial edema . Electronically Signed   By: Marcello Moores  Register   On: 07/23/2016 07:31    Medications:     Scheduled Medications: . amiodarone  200 mg Oral Q12H   Followed by  . [START ON 07/30/2016] amiodarone  200 mg Oral Daily  . aspirin EC  81 mg Oral Daily  . B-complex with vitamin C  1 tablet Oral Daily  . carvedilol  3.125 mg Oral BID WC  . digoxin  0.125 mg Oral Daily  . docusate sodium  200 mg Oral Daily  . enoxaparin (LOVENOX) injection  40 mg Subcutaneous QHS  . ezetimibe  10 mg Oral Daily  . furosemide  40 mg Intravenous BID  . guaiFENesin  600 mg Oral BID  . lactulose  20 g Oral Once  . multivitamin with minerals  1 tablet Oral Daily  . omega-3 acid ethyl esters  1 capsule Oral BID  . pantoprazole  40 mg Oral QAC breakfast  . potassium chloride  20 mEq Oral Once  . potassium chloride  20 mEq Oral Daily  . sodium chloride flush  3 mL Intravenous Q12H    Infusions:    PRN Medications: sodium chloride, acetaminophen, bisacodyl **OR** bisacodyl, guaiFENesin, ondansetron **OR** ondansetron (ZOFRAN) IV, oxyCODONE, sodium chloride flush, traMADol   Assessment/Plan    1. S/P CABG x4 on 07/19/16: On aspirin and statin.   2.  A/C Systolic Heart Failure: Ischemic  cardiomyopathy. In the past he was offered ICD however he declined. Intraoperative TEE with reduced EF 15%.  He is still volume overloaded on exam and weight is up a lot from baseline.   - Increae lasix to 60 mg IV twice a day.  - Continue Coreg. Add 12.5 mg spiro daily.  - Add 12.5 mg losartan daily.   - Continue digoxin.  - Add ted hose.  3. PVCs: Prior in hospital stay ?short burst of Afib 120s. Also having PVCs. Started on amio drip and transitioned to amio 200 mg twice daily. TSH was normal.  4. H/O Non Small Cell Lung CA 5. H/O multiple lung cancers.  Length of Stay: [place] NP-C  07/24/2016, 8:34 AM  Advanced Heart Failure Team Pager [phone] (M-F; 7a - 4p)  Please contact [facility] for night-coverage after hours (4p -7a ) and weekends on [email or web address]   Patient seen with NP, agree with the above note.  He remains volume overloaded on exam.  Walking in halls without lightheadedness and BP stable.  - Continue Coreg and digoxin.  Will need digoxin level in a couple of days.  - Add spironolactone 12.5 daily, losartan 12.5 daily. - Weight down 3 lbs but needs to come down more.  Will increase Lasix to 60 mg IV bid.   Loralie Champagne 07/24/2016 9:00 AM

## 2016-07-24 NOTE — Progress Notes (Addendum)
      [address]       [place] [address]             [phone]        5 Days Post-Op Procedure(s) (LRB): CORONARY ARTERY BYPASS GRAFTING (CABG) x 4 (LIMA to LAD, SVG to DIAGONAL, SVG to CIRCUMFLEX, SVG to PDA) with EVH from [place] and LEFT INTERNAL MAMMARY ARTERY (N/A) TRANSESOPHAGEAL ECHOCARDIOGRAM (TEE) (N/A) Right sternoclavicular joint plating (Right)  Subjective: Patient with complaints of productive cough, constipation.  Objective: Vital signs in last 24 hours: Temp:  [97.4 F (36.3 C)-99.2 F (37.3 C)] 98 F (36.7 C) (07/26 0400) Pulse Rate:  [70-103] 84 (07/26 0400) Cardiac Rhythm: Normal sinus rhythm (07/25 1900) Resp:  [16-26] 19 (07/26 0400) BP: (97-197)/(51-79) 133/62 (07/26 0400) SpO2:  [87 %-100 %] 100 % (07/26 0400) FiO2 (%):  [21 %] 21 % (07/25 0925) Weight:  [175 lb 8 oz (79.6 kg)] 175 lb 8 oz (79.6 kg) (07/26 0400)  Pre op weight 73 kg Current Weight  07/24/16 175 lb 8 oz (79.6 kg)      Intake/Output from previous day: 07/25 0701 - 07/26 0700 In: 600 [P.O.:600] Out: 1150 [Urine:1150]   Physical Exam:  Cardiovascular: RRR, no murmur Pulmonary: Slightly diminished at bases Abdomen: Soft, non tender, protuberant,bowel sounds present. Extremities: Bilateral lower extremity edema; serosanguinous ooze right thigh wound. Wounds: Sternal wound is clean and dry.  No erythema or signs of infection.  Lab Results: CBC: Recent Labs  07/22/16 0411 07/24/16 0342  WBC 10.9* 7.2  HGB 8.3* 8.7*  HCT 25.8* 26.9*  PLT 119* 190   BMET:  Recent Labs  07/22/16 0409 07/24/16 0342  NA 134* 135  K 4.2 3.7  CL 106 101  CO2 25 28  GLUCOSE 105* 113*  BUN 16 19  CREATININE 0.96 0.99  CALCIUM 7.9* 8.3*    PT/INR:  Lab Results  Component Value Date   INR 1.50 (H) 07/19/2016   INR 1.06 07/17/2016   INR 1.21 06/24/2012   ABG:  INR: Will add last result for INR, ABG once components are confirmed Will add  last 4 CBG results once components are confirmed  Assessment/Plan:  1. CV - Previous a fib. Maintaining SR in the 80's. On Amiodarone 200 mg bid, Coreg 3.125 mg bid, Digoxin 0.125 mg daily. 2.  Pulmonary - On room air. CXR this am shows no pneumothorax, small bilateral pleural effusions and atelectasis, and cardiomegaly. Will give Mucinex for cough.Encourage incentive spirometer. 3. Volume Overload - On Lasix 40 mg IV bid. Per heart failure. 4.  Acute blood loss anemia - H and H stable at 8.7 and 26.9 5. Supplement potassium 6. CBGs 116/153/115. Pre op HGA1C 5.8. He  Is likely pre diabetic. He will need further surveillance by his medical doctor after discharge. Stop SS and PRN. 7. LOC constipation 8. Remove EPW  ZIMMERMAN,[name] 07/24/2016,7:34 AM  I have seen and examined [name] [name] and agree with the above assessment  and plan.  Grace Isaac MD [place] [phone] Office [phone] 07/24/2016 2:46 PM

## 2016-07-24 NOTE — Progress Notes (Signed)
Patient sitting up in bed after being repositioned. No needs at this time. Call light within reach.

## 2016-07-24 NOTE — Progress Notes (Signed)
Pt assisted to ambulated hall approx 350 ft using RW.  Steady gait, no complaints, no tele alarms.  To bed after walk, PRN pain meds given per request, prep to DC pacing wires.

## 2016-07-24 NOTE — Consult Note (Signed)
   [facility] CM Inpatient Consult   07/24/2016  [name] [name] October 09, 1936 [id number]    Patient screened for long-term disease management services with [facility] program on behalf of his [employer] insurance. Went to bedside to discuss and offer [facility] services. Patient is agreeable and written consent signed. Explained to patient that he will receive post hospital transition of care calls and will be evaluated for monthly home visits. Confirmed Primary Care MD as Dr. Laurann [place].  Confirmed best contact number as home [phone] or cell at [phone]. Explained that [facility] will not interfere or replace services provided by home health. Mr. Kapusta reports he lives with his wife. He denies issues with obtaining medication or with transportation.  Left [facility] Care Management packet and contact information at bedside. Will make inpatient RNCM aware that patient will be followed by [facility] post hospital discharge.  Will request for Mr. Cutshaw to be assigned to [facility] for transition of care. He is s/p CABG. Noted he is pre diabetic. History of acute on chronic systolic heart failure, HTN, HLD.   Marthenia Rolling, MSN-Ed, RN,BSN [facility] Liaison [phone]

## 2016-07-24 NOTE — Discharge Instructions (Signed)
Coronary Artery Bypass Grafting, Care After °Refer to this sheet in the next few weeks. These instructions provide you with information on caring for yourself after your procedure. Your health care provider may also give you more specific instructions. Your treatment has been planned according to current medical practices, but problems sometimes occur. Call your health care provider if you have any problems or questions after your procedure. °WHAT TO EXPECT AFTER THE PROCEDURE °Recovery from surgery will be different for everyone. Some people feel well after 3 or 4 weeks, while for others it takes longer. After your procedure, it is typical to have the following: °· Nausea and a lack of appetite.   °· Constipation. °· Weakness and fatigue.   °· Depression or irritability.   °· Pain or discomfort at your incision site. °HOME CARE INSTRUCTIONS °· Take medicines only as directed by your health care provider. Do not stop taking medicines or start any new medicines without first checking with your health care provider. °· Take your pulse as directed by your health care provider. °· Perform deep breathing as directed by your health care provider. If you were given a device called an incentive spirometer, use it to practice deep breathing several times a day. Support your chest with a pillow or your arms when you take deep breaths or cough. °· Keep incision areas clean, dry, and protected. Remove or change any bandages (dressings) only as directed by your health care provider. You may have skin adhesive strips over the incision areas. Do not take the strips off. They will fall off on their own. °· Check incision areas daily for any swelling, redness, or drainage. °· If incisions were made in your legs, do the following: °¨ Avoid crossing your legs.   °¨ Avoid sitting for long periods of time. Change positions every 30 minutes.   °¨ Elevate your legs when you are sitting. °· Wear compression stockings as directed by your  health care provider. These stockings help keep blood clots from forming in your legs. °· Take showers once your health care provider approves. Until then, only take sponge baths. Pat incisions dry. Do not rub incisions with a washcloth or towel. Do not take baths, swim, or use a hot tub until your health care provider approves. °· Eat foods that are high in fiber, such as raw fruits and vegetables, whole grains, beans, and nuts. Meats should be lean cut. Avoid canned, processed, and fried foods. °· Drink enough fluid to keep your urine clear or pale yellow. °· Weigh yourself every day. This helps identify if you are retaining fluid that may make your heart and lungs work harder. °· Rest and limit activity as directed by your health care provider. You may be instructed to: °¨ Stop any activity at once if you have chest pain, shortness of breath, irregular heartbeats, or dizziness. Get help right away if you have any of these symptoms. °¨ Move around frequently for short periods or take short walks as directed by your health care provider. Increase your activities gradually. You may need physical therapy or cardiac rehabilitation to help strengthen your muscles and build your endurance. °¨ Avoid lifting, pushing, or pulling anything heavier than 10 lb (4.5 kg) for at least 6 weeks after surgery. °· Do not drive until your health care provider approves.  °· Ask your health care provider when you may return to work. °· Ask your health care provider when you may resume sexual activity. °· Keep all follow-up visits as directed by your health care   provider. This is important. °SEEK MEDICAL CARE IF: °· You have swelling, redness, increasing pain, or drainage at the site of an incision. °· You have a fever. °· You have swelling in your ankles or legs. °· You have pain in your legs.   °· You gain 2 or more pounds (0.9 kg) a day. °· You are nauseous or vomit. °· You have diarrhea.  °SEEK IMMEDIATE MEDICAL CARE IF: °· You have  chest pain that goes to your jaw or arms. °· You have shortness of breath.   °· You have a fast or irregular heartbeat.   °· You notice a "clicking" in your breastbone (sternum) when you move.   °· You have numbness or weakness in your arms or legs. °· You feel dizzy or light-headed.   °MAKE SURE YOU: °· Understand these instructions. °· Will watch your condition. °· Will get help right away if you are not doing well or get worse. °  °This information is not intended to replace advice given to you by your health care provider. Make sure you discuss any questions you have with your health care provider. °  °Document Released: 07/05/2005 Document Revised: 01/06/2015 Document Reviewed: 05/25/2013 °Elsevier Interactive Patient Education ©2016 [employer] ° °

## 2016-07-24 NOTE — Progress Notes (Signed)
EPW's removed per order and unit protocol.  Pt tolerated very well, all tips intact, sites unremarkable.  CCMD notified.  Pt aware of bedrest for one hr with frequent VS checks.  Will monitor closely.

## 2016-07-24 NOTE — Progress Notes (Signed)
CARDIAC REHAB PHASE I   PRE:  Rate/Rhythm: 87 SR    BP: sitting 108/79    SaO2: 93 RA  MODE:  Ambulation: 550 ft   POST:  Rate/Rhythm: 107 ST with PVC    BP: sitting 133/68     SaO2: 92 RA  Pt up in recliner. Able to get up independently and walk with RW, steady gait and pace. Rest x2 for DOE. To BR after walk then recliner. Pt moving well, no c/o. He is planning on x2 more walks today. [phone]   [place], ACSM 07/24/2016 10:02 AM

## 2016-07-25 LAB — BASIC METABOLIC PANEL
ANION GAP: 7 (ref 5–15)
BUN: 20 mg/dL (ref 6–20)
CHLORIDE: 98 mmol/L — AB (ref 101–111)
CO2: 30 mmol/L (ref 22–32)
Calcium: 8.3 mg/dL — ABNORMAL LOW (ref 8.9–10.3)
Creatinine, Ser: 1.09 mg/dL (ref 0.61–1.24)
GFR calc Af Amer: >60 mL/min (ref >60)
Glucose, Bld: 116 mg/dL — ABNORMAL HIGH (ref 65–99)
POTASSIUM: 4 mmol/L (ref 3.5–5.1)
SODIUM: 135 mmol/L (ref 135–145)

## 2016-07-25 MED ORDER — LOSARTAN POTASSIUM 25 MG PO TABS
12.5000 mg | ORAL_TABLET | Freq: Two times a day (BID) | ORAL | Status: DC
  Administered 2016-07-25: 12.5 mg via ORAL
  Filled 2016-07-25: qty 1

## 2016-07-25 MED ORDER — LEVALBUTEROL HCL 0.63 MG/3ML IN NEBU
0.6300 mg | INHALATION_SOLUTION | Freq: Three times a day (TID) | RESPIRATORY_TRACT | Status: DC | PRN
  Administered 2016-07-25 (×2): 0.63 mg via RESPIRATORY_TRACT
  Filled 2016-07-25 (×2): qty 3

## 2016-07-25 MED ORDER — SPIRONOLACTONE 25 MG PO TABS
25.0000 mg | ORAL_TABLET | Freq: Every day | ORAL | Status: DC
  Administered 2016-07-25 – 2016-07-29 (×5): 25 mg via ORAL
  Filled 2016-07-25 (×5): qty 1

## 2016-07-25 NOTE — Progress Notes (Signed)
CARDIAC REHAB PHASE I   PRE:  Rate/Rhythm: 74 SR    BP: sitting 107/56    SaO2: 92 RA  MODE:  Ambulation: 690 ft   POST:  Rate/Rhythm: 94 SR    BP: sitting 131/67     SaO2: 94 RA  Pt tolerated well, no c/o until end of walk when he began feeling fatigued. Increased distance. To bed for rest. Will f/u. [place], ACSM 07/25/2016 10:46 AM

## 2016-07-25 NOTE — Progress Notes (Signed)
Patient sitting up in bed, son and daughter in law present at bedside. No needs at this time. Call light at this time.

## 2016-07-25 NOTE — Care Management Important Message (Signed)
Important Message  Patient Details  Name: [name] [name] MRN: [id number] Date of Birth: 1936/08/14   Medicare Important Message Given:  Yes    Nathen May 07/25/2016, 12:08 PM

## 2016-07-25 NOTE — Progress Notes (Signed)
Patient requesting additional pain medication, is sore from coughing. Patient has some slight wheezing. Checked and his O2 level is at 96, and is not having difficulty breathing. Will continue to monitor.

## 2016-07-25 NOTE — Progress Notes (Addendum)
      [address]       [name] [address]             [phone]        6 Days Post-Op Procedure(s) (LRB): CORONARY ARTERY BYPASS GRAFTING (CABG) x 4 (LIMA to LAD, SVG to DIAGONAL, SVG to CIRCUMFLEX, SVG to PDA) with EVH from [place] and LEFT INTERNAL MAMMARY ARTERY (N/A) TRANSESOPHAGEAL ECHOCARDIOGRAM (TEE) (N/A) Right sternoclavicular joint plating (Right)  Subjective: Patient had bowel movement. Has complaints of expiratory wheezing.  Objective: Vital signs in last 24 hours: Temp:  [97.8 F (36.6 C)-99.1 F (37.3 C)] 97.9 F (36.6 C) (07/27 0519) Pulse Rate:  [77-82] 77 (07/27 0519) Cardiac Rhythm: Normal sinus rhythm (07/26 1900) Resp:  [18] 18 (07/27 0519) BP: (107-123)/(55-71) 123/60 (07/27 0519) SpO2:  [93 %-98 %] 95 % (07/27 0519) Weight:  [172 lb (78 kg)] 172 lb (78 kg) (07/27 0447)  Pre op weight 73 kg Current Weight  07/25/16 172 lb (78 kg)      Intake/Output from previous day: 07/26 0701 - 07/27 0700 In: 480 [P.O.:480] Out: 1302 [Urine:1300; Stool:2]   Physical Exam:  Cardiovascular: RRR, no murmur Pulmonary: Expiratory wheezing Abdomen: Soft, non tender,bowel sounds present. Extremities: Bilateral lower extremity edema; serosanguinous ooze right thigh wound. Ecchymosis right thigh. Wounds: Sternal wound is clean and dry.  No erythema or signs of infection.  Lab Results: CBC:  Recent Labs  07/24/16 0342  WBC 7.2  HGB 8.7*  HCT 26.9*  PLT 190   BMET:   Recent Labs  07/24/16 0342 07/25/16 0228  NA 135 135  K 3.7 4.0  CL 101 98*  CO2 28 30  GLUCOSE 113* 116*  BUN 19 20  CREATININE 0.99 1.09  CALCIUM 8.3* 8.3*    PT/INR:  Lab Results  Component Value Date   INR 1.50 (H) 07/19/2016   INR 1.06 07/17/2016   INR 1.21 06/24/2012   ABG:  INR: Will add last result for INR, ABG once components are confirmed Will add last 4 CBG results once components are confirmed  Assessment/Plan:  1.  CV - Previous a fib, PVCs. Has ischemic cardiomyopathy (LVEF 15%). Pre iously declined ICD.Maintaining SR in the 70's. On Amiodarone 200 mg bid, Coreg 3.125 mg bid, Digoxin 0.125 mg daily, Spironolactone 12.5 mg daily. 2.  Pulmonary - On room air. Will give Xopenex for wheezing.Encourage incentive spirometer. 3. Volume Overload - On Lasix 60 mg IV bid. Per heart failure. 4.  Acute blood loss anemia - H and H stable at 8.7 and 26.9    ZIMMERMAN,[name] 07/25/2016,7:37 AM  Slow progress Renal function stable, on lasix iv Holding sinus I have seen and examined [name] [name] and agree with the above assessment  and plan.  Grace Isaac MD [facility] [phone] Office [phone] 07/25/2016 8:51 AM

## 2016-07-25 NOTE — Progress Notes (Signed)
Pt ambulated 600 ft in hallway with rolling walker. Pt tolerated well. Pt returned to chair. Call light within reach.   Grant Fontana BSN, RN

## 2016-07-25 NOTE — Progress Notes (Signed)
Advanced Heart Failure Rounding Note   Subjective:    Yesterday IV lasix increased. Weight down 3 pounds. Also losartan and spiro added.  Denies SOB/Orthopnea/PND.    Objective:   Weight Range:  Vital Signs:   Temp:  [97.8 F (36.6 C)-99.1 F (37.3 C)] 97.9 F (36.6 C) (07/27 0519) Pulse Rate:  [77-82] 77 (07/27 0754) Resp:  [18] 18 (07/27 0519) BP: (107-123)/(55-71) 110/64 (07/27 0754) SpO2:  [93 %-98 %] 95 % (07/27 0519) Weight:  [78 kg (172 lb)] 78 kg (172 lb) (07/27 0447) Last BM Date: 07/24/16  Weight change: Filed Weights   07/23/16 0459 07/24/16 0400 07/25/16 0447  Weight: 80.9 kg (178 lb 5.6 oz) 79.6 kg (175 lb 8 oz) 78 kg (172 lb)    Intake/Output:   Intake/Output Summary (Last 24 hours) at 07/25/16 0859 Last data filed at 07/25/16 0447  Gross per 24 hour  Intake              240 ml  Output             1302 ml  Net            -1062 ml     Physical Exam: General:  Well appearing. No resp difficulty. In the chair.  HEENT: normal Neck: supple. JVP ~10 Carotids 2+ bilat; no bruits. No lymphadenopathy or thryomegaly appreciated. Cor: PMI nondisplaced. Regular rate & rhythm. No rubs, gallops or murmurs. Lungs: Decreased in the bases.  Abdomen: soft, nontender, distended. No hepatosplenomegaly. No bruits or masses. Good bowel sounds. Extremities: no cyanosis, clubbing, rash, R and LLE trace-1+ edema Neuro: alert & orientedx3, cranial nerves grossly intact. moves all 4 extremities w/o difficulty. Affect pleasant  Telemetry: NSR 80s   Labs: Basic Metabolic Panel:  Recent Labs Lab 07/19/16 2108  07/20/16 0400 07/20/16 1730 07/20/16 1734 07/21/16 0505 07/22/16 0409 07/22/16 0855 07/23/16 0913 07/24/16 0342 07/25/16 0228  NA  --   --  136  --  136 133* 134*  --   --  135 135  K  --   --  4.0  --  4.2 4.1 4.2  --   --  3.7 4.0  CL  --   --  110  --  101 105 106  --   --  101 98*  CO2  --   --  23  --   --  25 25  --   --  28 30  GLUCOSE  --    --  118*  --  142* 146* 105*  --   --  113* 116*  BUN  --   --  12  --  '14 15 16  '$ --   --  19 20  CREATININE 0.80  --  0.80 0.90 0.90 0.84 0.96  --   --  0.99 1.09  CALCIUM  --   < > 7.1*  --   --  7.7* 7.9*  --   --  8.3* 8.3*  MG 2.8*  --  2.3 2.2  --   --   --  2.0 2.0  --   --   < > = values in this interval not displayed.  Liver Function Tests: No results for input(s): AST, ALT, ALKPHOS, BILITOT, PROT, ALBUMIN in the last 168 hours. No results for input(s): LIPASE, AMYLASE in the last 168 hours. No results for input(s): AMMONIA in the last 168 hours.  CBC:  Recent Labs Lab 07/20/16 0400 07/20/16 1730 07/20/16  1734 07/21/16 0505 07/22/16 0411 07/24/16 0342  WBC 13.9* 14.4*  --  15.1* 10.9* 7.2  HGB 9.4* 9.2* 8.8* 8.8* 8.3* 8.7*  HCT 28.1* 26.8* 26.0* 26.0* 25.8* 26.9*  MCV 91.5 90.5  --  92.2 92.8 93.4  PLT 124* 125*  --  123* 119* 190    Cardiac Enzymes: No results for input(s): CKTOTAL, CKMB, CKMBINDEX, TROPONINI in the last 168 hours.  BNP: BNP (last 3 results) No results for input(s): BNP in the last 8760 hours.  ProBNP (last 3 results) No results for input(s): PROBNP in the last 8760 hours.    Other results:  Imaging: Dg Chest 2 View  Result Date: 07/24/2016 CLINICAL DATA:  80 year old male status post bypass surgery. EXAM: CHEST  2 VIEW COMPARISON:  Chest radiograph dated 07/23/2016 FINDINGS: There small bilateral pleural effusions. There has been interval improvement of the aeration of the lungs. Left lung base airspace opacity may represent atelectatic changes. Pneumonia is not excluded. Clinical correlation is recommended. No pneumothorax. There is median sternotomy wires and CABG vascular clips. Overall there has been interval decrease in the bone cardiopericardial silhouette. Fixation hardware noted over the medial right clavicle. No acute fracture. IMPRESSION: Overall interval improvement of the aeration of the lungs with small bilateral pleural effusions  and left lung base atelectasis versus infiltrate. Interval decrease in the size of the cardiopericardial silhouette. Electronically Signed   By: Anner Crete M.D.   On: 07/24/2016 06:36    Medications:     Scheduled Medications: . amiodarone  200 mg Oral Q12H   Followed by  . [START ON 07/30/2016] amiodarone  200 mg Oral Daily  . aspirin EC  81 mg Oral Daily  . B-complex with vitamin C  1 tablet Oral Daily  . carvedilol  3.125 mg Oral BID WC  . digoxin  0.125 mg Oral Daily  . docusate sodium  200 mg Oral Daily  . enoxaparin (LOVENOX) injection  40 mg Subcutaneous QHS  . ezetimibe  10 mg Oral Daily  . furosemide  60 mg Intravenous BID  . guaiFENesin  600 mg Oral BID  . losartan  12.5 mg Oral Daily  . multivitamin with minerals  1 tablet Oral Daily  . omega-3 acid ethyl esters  1 capsule Oral BID  . pantoprazole  40 mg Oral QAC breakfast  . potassium chloride  20 mEq Oral Daily  . sodium chloride flush  3 mL Intravenous Q12H  . spironolactone  12.5 mg Oral Daily    Infusions:    PRN Medications: sodium chloride, acetaminophen, bisacodyl **OR** bisacodyl, guaiFENesin, levalbuterol, ondansetron **OR** ondansetron (ZOFRAN) IV, oxyCODONE, sodium chloride flush, traMADol   Assessment/Plan    1. S/P CABG x4 on 07/19/16: On aspirin and statin.   2. A/C Systolic Heart Failure: Ischemic cardiomyopathy. In the past he was offered ICD however he declined. Intraoperative TEE with reduced EF 15%.   Volume status slowly improving. Continue lasix to 60 mg IV twice a day. Renal function stable.  - Continue Coreg 3.125 mg twice a day. Increase spiro to 25 mg daily.   - Increase losartan to 12.5 bid.    - Continue digoxin.  - Add ted hose.  3. PVCs: Prior in hospital stay ?short burst of Afib 120s. Also having PVCs. Started on amio drip and transitioned to amio 200 mg twice daily. TSH was normal.  4. H/O Non Small Cell Lung CA 5. H/O multiple lung cancers.  Length of Stay: [place] NP-C  07/25/2016, 8:59 AM  Advanced Heart Failure Team Pager [phone] (M-F; 7a - 4p)  Please contact [facility] for night-coverage after hours (4p -7a ) and weekends on [email or web address]   Patient seen with NP, agree with the above note.  Still volume overloaded on exam.  He is diuresing, weight down another 3 lbs.  Continue IV Lasix.  Increase spironolactone and losartan today.   Loralie Champagne 07/25/2016 11:30 AM

## 2016-07-26 LAB — COMPREHENSIVE METABOLIC PANEL
ALT: 39 U/L (ref 17–63)
AST: 31 U/L (ref 15–41)
Albumin: 2.7 g/dL — ABNORMAL LOW (ref 3.5–5.0)
Alkaline Phosphatase: 90 U/L (ref 38–126)
Anion gap: 10 (ref 5–15)
BUN: 23 mg/dL — ABNORMAL HIGH (ref 6–20)
CO2: 31 mmol/L (ref 22–32)
Calcium: 8.7 mg/dL — ABNORMAL LOW (ref 8.9–10.3)
Chloride: 95 mmol/L — ABNORMAL LOW (ref 101–111)
Creatinine, Ser: 1.09 mg/dL (ref 0.61–1.24)
GFR calc Af Amer: >60 mL/min (ref >60)
GFR calc non Af Amer: >60 mL/min (ref >60)
Glucose, Bld: 118 mg/dL — ABNORMAL HIGH (ref 65–99)
Potassium: 3.9 mmol/L (ref 3.5–5.1)
Sodium: 136 mmol/L (ref 135–145)
Total Bilirubin: 1.5 mg/dL — ABNORMAL HIGH (ref 0.3–1.2)
Total Protein: 5.6 g/dL — ABNORMAL LOW (ref 6.5–8.1)

## 2016-07-26 LAB — CBC
HCT: 26 % — ABNORMAL LOW (ref 39.0–52.0)
Hemoglobin: 8.6 g/dL — ABNORMAL LOW (ref 13.0–17.0)
MCH: 30.3 pg (ref 26.0–34.0)
MCHC: 33.1 g/dL (ref 30.0–36.0)
MCV: 91.5 fL (ref 78.0–100.0)
Platelets: 243 10*3/uL (ref 150–400)
RBC: 2.84 MIL/uL — ABNORMAL LOW (ref 4.22–5.81)
RDW: 13.1 % (ref 11.5–15.5)
WBC: 6.5 10*3/uL (ref 4.0–10.5)

## 2016-07-26 MED ORDER — POTASSIUM CHLORIDE CRYS ER 20 MEQ PO TBCR
20.0000 meq | EXTENDED_RELEASE_TABLET | Freq: Once | ORAL | Status: AC
  Administered 2016-07-26: 20 meq via ORAL
  Filled 2016-07-26: qty 1

## 2016-07-26 MED ORDER — LOSARTAN POTASSIUM 25 MG PO TABS
25.0000 mg | ORAL_TABLET | Freq: Two times a day (BID) | ORAL | Status: DC
  Administered 2016-07-26 – 2016-07-27 (×4): 25 mg via ORAL
  Filled 2016-07-26 (×4): qty 1

## 2016-07-26 MED ORDER — LACTULOSE 10 GM/15ML PO SOLN: 10.0000 g | Freq: Every day | ORAL | Status: DC | PRN

## 2016-07-26 NOTE — Progress Notes (Signed)
Patient ID: [name] [name], male   DOB: Feb 06, 1936, 80 y.o.   MRN: [id number]    Advanced Heart Failure Rounding Note   Subjective:    He continues to diurese well, weight down again today.   Denies SOB/Orthopnea/PND.    Objective:   Weight Range:  Vital Signs:   Temp:  [98.4 F (36.9 C)-98.6 F (37 C)] 98.6 F (37 C) (07/28 0508) Pulse Rate:  [67-80] 73 (07/28 0700) Resp:  [18-20] 20 (07/28 0508) BP: (98-139)/(52-71) 126/71 (07/28 0700) SpO2:  [95 %-97 %] 95 % (07/28 0508) Weight:  [168 lb 3.2 oz (76.3 kg)-178 lb 3.2 oz (80.8 kg)] 168 lb 3.2 oz (76.3 kg) (07/28 0755) Last BM Date: 07/24/16  Weight change: Filed Weights   07/25/16 0447 07/26/16 0508 07/26/16 0755  Weight: 172 lb (78 kg) 178 lb 3.2 oz (80.8 kg) 168 lb 3.2 oz (76.3 kg)    Intake/Output:   Intake/Output Summary (Last 24 hours) at 07/26/16 0859 Last data filed at 07/26/16 0751  Gross per 24 hour  Intake                0 ml  Output             2600 ml  Net            -2600 ml     Physical Exam: General:  Well appearing. No resp difficulty. In the chair.  HEENT: normal Neck: supple. JVP ~10 Carotids 2+ bilat; no bruits. No lymphadenopathy or thryomegaly appreciated. Cor: PMI nondisplaced. Regular rate & rhythm. No rubs, gallops or murmurs. Lungs: Decreased in the bases.  Abdomen: soft, nontender, distended. No hepatosplenomegaly. No bruits or masses. Good bowel sounds. Extremities: no cyanosis, clubbing, rash, no edema.  Neuro: alert & orientedx3, cranial nerves grossly intact. moves all 4 extremities w/o difficulty. Affect pleasant  Telemetry: NSR 80s   Labs: Basic Metabolic Panel:  Recent Labs Lab 07/19/16 2108  07/20/16 0400 07/20/16 1730  07/21/16 0505 07/22/16 0409 07/22/16 0855 07/23/16 0913 07/24/16 0342 07/25/16 0228 07/26/16 0039  NA  --   --  136  --   < > 133* 134*  --   --  135 135 136  K  --   --  4.0  --   < > 4.1 4.2  --   --  3.7 4.0 3.9  CL  --   --  110  --   < >  105 106  --   --  101 98* 95*  CO2  --   < > 23  --   --  25 25  --   --  '28 30 31  '$ GLUCOSE  --   --  118*  --   < > 146* 105*  --   --  113* 116* 118*  BUN  --   --  12  --   < > 15 16  --   --  19 20 23*  CREATININE 0.80  --  0.80 0.90  < > 0.84 0.96  --   --  0.99 1.09 1.09  CALCIUM  --   < > 7.1*  --   --  7.7* 7.9*  --   --  8.3* 8.3* 8.7*  MG 2.8*  --  2.3 2.2  --   --   --  2.0 2.0  --   --   --   < > = values in this interval not displayed.  Liver Function Tests:  Recent Labs Lab 07/26/16 0039  AST 31  ALT 39  ALKPHOS 90  BILITOT 1.5*  PROT 5.6*  ALBUMIN 2.7*   No results for input(s): LIPASE, AMYLASE in the last 168 hours. No results for input(s): AMMONIA in the last 168 hours.  CBC:  Recent Labs Lab 07/20/16 1730 07/20/16 1734 07/21/16 0505 07/22/16 0411 07/24/16 0342 07/26/16 0039  WBC 14.4*  --  15.1* 10.9* 7.2 6.5  HGB 9.2* 8.8* 8.8* 8.3* 8.7* 8.6*  HCT 26.8* 26.0* 26.0* 25.8* 26.9* 26.0*  MCV 90.5  --  92.2 92.8 93.4 91.5  PLT 125*  --  123* 119* 190 243    Cardiac Enzymes: No results for input(s): CKTOTAL, CKMB, CKMBINDEX, TROPONINI in the last 168 hours.  BNP: BNP (last 3 results) No results for input(s): BNP in the last 8760 hours.  ProBNP (last 3 results) No results for input(s): PROBNP in the last 8760 hours.    Other results:  Imaging: No results found.   Medications:     Scheduled Medications: . amiodarone  200 mg Oral Q12H   Followed by  . [START ON 07/30/2016] amiodarone  200 mg Oral Daily  . aspirin EC  81 mg Oral Daily  . B-complex with vitamin C  1 tablet Oral Daily  . carvedilol  3.125 mg Oral BID WC  . digoxin  0.125 mg Oral Daily  . docusate sodium  200 mg Oral Daily  . enoxaparin (LOVENOX) injection  40 mg Subcutaneous QHS  . ezetimibe  10 mg Oral Daily  . furosemide  60 mg Intravenous BID  . guaiFENesin  600 mg Oral BID  . losartan  25 mg Oral BID  . multivitamin with minerals  1 tablet Oral Daily  . omega-3  acid ethyl esters  1 capsule Oral BID  . pantoprazole  40 mg Oral QAC breakfast  . potassium chloride  20 mEq Oral Daily  . sodium chloride flush  3 mL Intravenous Q12H  . spironolactone  25 mg Oral Daily    Infusions:    PRN Medications: sodium chloride, acetaminophen, bisacodyl **OR** bisacodyl, guaiFENesin, levalbuterol, ondansetron **OR** ondansetron (ZOFRAN) IV, oxyCODONE, sodium chloride flush, traMADol   Assessment/Plan    1. S/P CABG x4 on 07/19/16: On aspirin and statin.   2. A/C Systolic Heart Failure: Ischemic cardiomyopathy. In the past he was offered ICD however he declined. Intraoperative TEE with reduced EF 15%.  Volume status improving.  - Needs at least 1 more day IV Lasix.  Continue 60 mg IV bid today. Renal function stable.  - Continue Coreg 3.125 mg twice a day and spironolactone 25 mg daily.  Increase Coreg tomorrow. - Increase losartan to 25 bid, will eventually transition to [facility].    - Continue digoxin, check level tomorrow.  - Echo in 3 months for ICD, not CRT candidate with narrow QRS.  3. PVCs: Prior in hospital stay ?short burst of Afib 120s. Also having PVCs. Started on amio drip and transitioned to amio 200 mg twice daily. TSH was normal.  Would aim for 1 month amiodarone post-hospital then stop if no further arrhythmias.  4. H/O Non Small Cell Lung CA 5. H/O multiple lung cancers. 6. Will need followup with me in CHF clinic.   Length of Stay: [place]   07/26/2016, 8:59 AM  Advanced Heart Failure Team Pager [phone] (M-F; 7a - 4p)  Please contact [facility] for night-coverage after hours (4p -7a ) and weekends on [email or web address]

## 2016-07-26 NOTE — Progress Notes (Signed)
CARDIAC REHAB PHASE I   PRE:  Rate/Rhythm: 70 SR    BP: sitting 112/67    SaO2: 95 RA  MODE:  Ambulation: 690 ft   POST:  Rate/Rhythm: 86 Sr    BP: sitting 135/79     SaO2: 93 RA  Pt tolerated well, no c/o. Tired toward end. Began discussing ed with pt. Discussed CRPII at [facility] and gave HF booklet and video to watch. He would like more education with his wife therefore we will finish up later. [phone]   [place], ACSM 07/26/2016 11:23 AM

## 2016-07-26 NOTE — Care Management Note (Signed)
Case Management Note Marvetta Gibbons RN, BSN Unit 2W-Case Manager [phone]  Patient Details  Name: [name] [name] MRN: [id number] Date of Birth: 1936-05-27  Subjective/Objective:  Pt admitted s/p CABG                  Action/Plan: PTA pt lived at home, anticipate return home- [facility] referral made for f/u after discharge- CM to follow for any d/c needs  Expected Discharge Date:                  Expected Discharge Plan:  [place]  In-House Referral:     Discharge planning Services  CM Consult  Post Acute Care Choice:    Choice offered to:     DME Arranged:    DME Agency:     HH Arranged:    [place] Agency:     Status of Service:  In process, will continue to follow  If discussed at Long Length of Stay Meetings, dates discussed:    Additional Comments:  Dawayne Patricia, RN 07/26/2016, 2:05 PM

## 2016-07-26 NOTE — Evaluation (Addendum)
Physical Therapy Evaluation Patient Details Name: [name] [name] MRN: [id number] DOB: Sep 29, 1936 Today's Date: 07/26/2016   History of Present Illness  80 y.o. male admitted for CABG 07/19/16. PMH of lung mass, BPH, CAD.  Clinical Impression  Pt is ambulating quite well with cardiac rehab (690' with RW, no loss of balance vital signs stable). He is independent with sit to stand and follows sternal precautions well. He requires assistance for supine to sit, he reports he will sleep in recliner at home as his wife is unable to assist with supine to sit due to physical limitations. Instructed pt in ankle pumps for RLE edema control. No further PT indicated as pt is modified independent with mobility. PT signing off.     Follow Up Recommendations No PT follow up    Equipment Recommendations  HHPT for home safety eval    Recommendations for Other Services       Precautions / Restrictions Precautions Precautions: Sternal Precaution Comments: reviewed sternal precautions Restrictions Weight Bearing Restrictions: No      Mobility  Bed Mobility               General bed mobility comments: up in recliner, pt reports he requires assist for sidelying to sit due to sternal precautions, his wife cannot assist with this due to back/BUE problems, so pt plans to sleep in recliner at home  Transfers Overall transfer level: Modified independent Equipment used: None             General transfer comment: pt uses momentum, BUEs on thighs for sit to stand, no physical assist needed  Ambulation/Gait Ambulation/Gait assistance: Modified independent (Device/Increase time)   Assistive device: Rolling walker (2 wheeled)       General Gait Details: NT- pt is ambulating TID with family/cardiac rehab. Per pt, wife, chart and conversation with CR, pt is walking 13' with RW, with stable vital signs, no loss of balance. PT assessment of gait not indicated.   Stairs             Wheelchair Mobility    Modified Rankin (Stroke Patients Only)       Balance Overall balance assessment: Modified Independent                                           Pertinent Vitals/Pain Pain Assessment: No/denies pain    Home Living Family/patient expects to be discharged to:: Private residence Living Arrangements: Spouse/significant other Available Help at Discharge: Family;Available 24 hours/day           Home Equipment: Walker - 2 wheels;Bedside commode      Prior Function Level of Independence: Independent               Hand Dominance        Extremity/Trunk Assessment   Upper Extremity Assessment: Defer to OT evaluation           Lower Extremity Assessment: Overall WFL for tasks assessed (RLE pitting edema)      Cervical / Trunk Assessment: Normal  Communication   Communication: No difficulties  Cognition Arousal/Alertness: Awake/alert Behavior During Therapy: WFL for tasks assessed/performed Overall Cognitive Status: Within Functional Limits for tasks assessed                      General Comments      Exercises General Exercises - Lower Extremity  Ankle Circles/Pumps: AROM;Both;10 reps;Seated      Assessment/Plan    PT Assessment Patent does not need any further PT services  PT Diagnosis     PT Problem List    PT Treatment Interventions     PT Goals (Current goals can be found in the Care Plan section) Acute Rehab PT Goals PT Goal Formulation: All assessment and education complete, DC therapy    Frequency     Barriers to discharge        Co-evaluation               End of Session   Activity Tolerance: Patient tolerated treatment well Patient left: in chair;with call bell/phone within reach;with family/visitor present Nurse Communication: Mobility status         Time: 7846-9629 PT Time Calculation (min) (ACUTE ONLY): 18 min   Charges:   PT Evaluation $PT Eval Low  Complexity: 1 Procedure     PT G CodesPhilomena Doheny 07/26/2016, 1:51 PM [phone]

## 2016-07-26 NOTE — Progress Notes (Addendum)
      [address]       [place] [address]             [phone]        7 Days Post-Op Procedure(s) (LRB): CORONARY ARTERY BYPASS GRAFTING (CABG) x 4 (LIMA to LAD, SVG to DIAGONAL, SVG to CIRCUMFLEX, SVG to PDA) with EVH from [place] and LEFT INTERNAL MAMMARY ARTERY (N/A) TRANSESOPHAGEAL ECHOCARDIOGRAM (TEE) (N/A) Right sternoclavicular joint plating (Right)  Subjective: Patient with minor constipation, but does not want laxative this am  Objective: Vital signs in last 24 hours: Temp:  [98.4 F (36.9 C)-98.6 F (37 C)] 98.6 F (37 C) (07/28 0508) Pulse Rate:  [67-80] 74 (07/28 0508) Cardiac Rhythm: Normal sinus rhythm;Heart block (07/27 1900) Resp:  [18-20] 20 (07/28 0508) BP: (98-139)/(52-67) 122/60 (07/28 0508) SpO2:  [95 %-97 %] 95 % (07/28 0508) Weight:  [178 lb 3.2 oz (80.8 kg)] 178 lb 3.2 oz (80.8 kg) (07/28 0508)  Pre op weight 73 kg Current Weight  07/26/16 178 lb 3.2 oz (80.8 kg)      Intake/Output from previous day: 07/27 0701 - 07/28 0700 In: -  Out: 2250 [Urine:2250]   Physical Exam:  Cardiovascular: RRR, no murmur Pulmonary: Slightly decreased at bases Abdomen: Soft, non tender,bowel sounds present. Extremities: Decreasing bilateral lower extremity edema; minor serosanguinous ooze right thigh wound. Ecchymosis right thigh. Wounds: Sternal wound is clean and dry.  No erythema or signs of infection.  Lab Results: CBC:  Recent Labs  07/24/16 0342 07/26/16 0039  WBC 7.2 6.5  HGB 8.7* 8.6*  HCT 26.9* 26.0*  PLT 190 243   BMET:   Recent Labs  07/25/16 0228 07/26/16 0039  NA 135 136  K 4.0 3.9  CL 98* 95*  CO2 30 31  GLUCOSE 116* 118*  BUN 20 23*  CREATININE 1.09 1.09  CALCIUM 8.3* 8.7*    PT/INR:  Lab Results  Component Value Date   INR 1.50 (H) 07/19/2016   INR 1.06 07/17/2016   INR 1.21 06/24/2012   ABG:  INR: Will add last result for INR, ABG once components are confirmed Will  add last 4 CBG results once components are confirmed  Assessment/Plan:  1. CV - Previous a fib, PVCs. Has ischemic cardiomyopathy (LVEF 15%). Pre iously declined ICD.Maintaining SR. On Amiodarone 200 mg bid, Coreg 3.125 mg bid, Digoxin 0.125 mg daily, Spironolactone 25 mg daily. 2.  Pulmonary - On room air. Encourage incentive spirometer. 3. Volume Overload - On Lasix 60 mg IV bid. Per heart failure. 4.  Acute blood loss anemia - H and H stable at 8.6 and 26 5. Supplement potassium 6. Will discharge once volume status more stable   ZIMMERMAN,[name] 07/26/2016,7:26 AM   Patient complains of "monkey Hands", some weakness in grip right greater then left- pt ot to evaluate, poss brachial stretch  Convert to po lasix tomorrow and poss home Sunday or Monday Follow up Heart failure clinic and Dr Jerene Canny I have seen and examined Harrietta Guardian and agree with the above assessment  and plan.  Grace Isaac MD Beeper [phone] Office [phone] 07/26/2016 9:13 AM

## 2016-07-27 LAB — BASIC METABOLIC PANEL
ANION GAP: 9 (ref 5–15)
BUN: 20 mg/dL (ref 6–20)
CHLORIDE: 95 mmol/L — AB (ref 101–111)
CO2: 32 mmol/L (ref 22–32)
Calcium: 8.8 mg/dL — ABNORMAL LOW (ref 8.9–10.3)
Creatinine, Ser: 1.14 mg/dL (ref 0.61–1.24)
GFR calc Af Amer: >60 mL/min (ref >60)
GFR, EST NON AFRICAN AMERICAN: 60 mL/min — AB (ref >60)
GLUCOSE: 116 mg/dL — AB (ref 65–99)
POTASSIUM: 4.1 mmol/L (ref 3.5–5.1)
Sodium: 136 mmol/L (ref 135–145)

## 2016-07-27 LAB — CBC
HEMATOCRIT: 26.7 % — AB (ref 39.0–52.0)
HEMOGLOBIN: 8.6 g/dL — AB (ref 13.0–17.0)
MCH: 30.3 pg (ref 26.0–34.0)
MCHC: 32.2 g/dL (ref 30.0–36.0)
MCV: 94 fL (ref 78.0–100.0)
Platelets: 305 10*3/uL (ref 150–400)
RBC: 2.84 MIL/uL — ABNORMAL LOW (ref 4.22–5.81)
RDW: 13.3 % (ref 11.5–15.5)
WBC: 8.1 10*3/uL (ref 4.0–10.5)

## 2016-07-27 MED ORDER — FUROSEMIDE 40 MG PO TABS
60.0000 mg | ORAL_TABLET | Freq: Two times a day (BID) | ORAL | Status: DC
  Administered 2016-07-27 – 2016-07-29 (×6): 60 mg via ORAL
  Filled 2016-07-27 (×5): qty 1

## 2016-07-27 NOTE — Progress Notes (Addendum)
[address]       [name] [address]             [phone]      8 Days Post-Op Procedure(s) (LRB): CORONARY ARTERY BYPASS GRAFTING (CABG) x 4 (LIMA to LAD, SVG to DIAGONAL, SVG to CIRCUMFLEX, SVG to PDA) with EVH from [place] and LEFT INTERNAL MAMMARY ARTERY (N/A) TRANSESOPHAGEAL ECHOCARDIOGRAM (TEE) (N/A) Right sternoclavicular joint plating (Right) Subjective: conts to feel better, c/o no change in hand sx  Objective: Vital signs in last 24 hours: Temp:  [97.4 F (36.3 C)-98.8 F (37.1 C)] 98.4 F (36.9 C) (07/29 0450) Pulse Rate:  [69-80] 80 (07/29 0450) Cardiac Rhythm: Normal sinus rhythm (07/29 0700) Resp:  [18-19] 19 (07/29 0450) BP: (97-138)/(53-71) 122/71 (07/29 0450) SpO2:  [93 %-97 %] 93 % (07/29 0450) Weight:  [165 lb 9.6 oz (75.1 kg)] 165 lb 9.6 oz (75.1 kg) (07/29 0450)  Hemodynamic parameters for last 24 hours:    Intake/Output from previous day: 07/28 0701 - 07/29 0700 In: 240 [P.O.:240] Out: 2850 [Urine:2850] Intake/Output this shift: No intake/output data recorded.  General appearance: alert, cooperative and no distress Heart: regular rate and rhythm Lungs: minor scattered wheeze Abdomen: benign Extremities: + edema Wound: incis healing well  Lab Results:  Recent Labs  07/26/16 0039 07/27/16 0316  WBC 6.5 8.1  HGB 8.6* 8.6*  HCT 26.0* 26.7*  PLT 243 305   BMET:  Recent Labs  07/26/16 0039 07/27/16 0316  NA 136 136  K 3.9 4.1  CL 95* 95*  CO2 31 32  GLUCOSE 118* 116*  BUN 23* 20  CREATININE 1.09 1.14  CALCIUM 8.7* 8.8*    PT/INR: No results for input(s): LABPROT, INR in the last 72 hours. ABG    Component Value Date/Time   PHART 7.318 (L) 07/19/2016 2122   HCO3 23.0 07/19/2016 2122   TCO2 20 07/20/2016 1734   ACIDBASEDEF 3.0 (H) 07/19/2016 2122   O2SAT 59.3 07/22/2016 1050   CBG (last 3)  No results for input(s): GLUCAP in the last 72 hours.  Meds Scheduled Meds: .  amiodarone  200 mg Oral Q12H   Followed by  . [START ON 07/30/2016] amiodarone  200 mg Oral Daily  . aspirin EC  81 mg Oral Daily  . B-complex with vitamin C  1 tablet Oral Daily  . carvedilol  3.125 mg Oral BID WC  . digoxin  0.125 mg Oral Daily  . docusate sodium  200 mg Oral Daily  . enoxaparin (LOVENOX) injection  40 mg Subcutaneous QHS  . ezetimibe  10 mg Oral Daily  . furosemide  60 mg Intravenous BID  . guaiFENesin  600 mg Oral BID  . losartan  25 mg Oral BID  . multivitamin with minerals  1 tablet Oral Daily  . omega-3 acid ethyl esters  1 capsule Oral BID  . pantoprazole  40 mg Oral QAC breakfast  . potassium chloride  20 mEq Oral Daily  . sodium chloride flush  3 mL Intravenous Q12H  . spironolactone  25 mg Oral Daily   Continuous Infusions:  PRN Meds:.sodium chloride, acetaminophen, bisacodyl **OR** bisacodyl, guaiFENesin, lactulose, levalbuterol, ondansetron **OR** ondansetron (ZOFRAN) IV, oxyCODONE, sodium chloride flush, traMADol  Xrays No results found.  Assessment/Plan: S/P Procedure(s) (LRB): CORONARY ARTERY BYPASS GRAFTING (CABG) x 4 (LIMA to LAD, SVG to DIAGONAL, SVG to CIRCUMFLEX, SVG to PDA) with EVH from [place] and LEFT INTERNAL MAMMARY  ARTERY (N/A) TRANSESOPHAGEAL ECHOCARDIOGRAM (TEE) (N/A) Right sternoclavicular joint plating (Right)   1 progressing well 2 OT has been ordered for hand therapy/poss brachial plexus stretch 3 lasix to po- diuresing well 4 labs stable 5 sinus rhythm, hemodyn stable   LOS: 8 days    [name],[name] E 07/27/2016  patient examined and medical record reviewed,agree with above note. [name] [name] 07/27/2016

## 2016-07-27 NOTE — Progress Notes (Signed)
Expand All Collapse All   '[]'$ Hide copied text '[]'$ Hover for attribution information      [address]       [place], [address]             [phone]                                                          8 Days Post-Op Procedure(s) (LRB): CORONARY ARTERY BYPASS GRAFTING (CABG) x 4 (LIMA to LAD, SVG to DIAGONAL, SVG to CIRCUMFLEX, SVG to PDA) with EVH from [place] and LEFT INTERNAL MAMMARY ARTERY (N/A) TRANSESOPHAGEAL ECHOCARDIOGRAM (TEE) (N/A) Right sternoclavicular joint plating (Right) Subjective: conts to feel better, c/o no change in hand sx  Objective: Vital signs in last 24 hours: Temp:  [97.4 F (36.3 C)-98.8 F (37.1 C)] 98.4 F (36.9 C) (07/29 0450) Pulse Rate:  [69-80] 80 (07/29 0450) Cardiac Rhythm: Normal sinus rhythm (07/29 0700) Resp:  [18-19] 19 (07/29 0450) BP: (97-138)/(53-71) 122/71 (07/29 0450) SpO2:  [93 %-97 %] 93 % (07/29 0450) Weight:  [165 lb 9.6 oz (75.1 kg)] 165 lb 9.6 oz (75.1 kg) (07/29 0450)  Hemodynamic parameters for last 24 hours:    Intake/Output from previous day: 07/28 0701 - 07/29 0700 In: 240 [P.O.:240] Out: 2850 [Urine:2850] Intake/Output this shift: No intake/output data recorded.  General appearance: alert, cooperative and no distress Heart: regular rate and rhythm Lungs: minor scattered wheeze Abdomen: benign Extremities: + edema Wound: incis healing well  Lab Results:  Recent Labs (last 2 labs)    Recent Labs  07/26/16 0039 07/27/16 0316  WBC 6.5 8.1  HGB 8.6* 8.6*  HCT 26.0* 26.7*  PLT 243 305     BMET:  Recent Labs (last 2 labs)    Recent Labs  07/26/16 0039 07/27/16 0316  NA 136 136  K 3.9 4.1  CL 95* 95*  CO2 31 32  GLUCOSE 118* 116*  BUN 23* 20  CREATININE 1.09 1.14  CALCIUM 8.7* 8.8*      PT/INR:  Recent Labs (last 2 labs)   No results for input(s): LABPROT, INR in the last 72 hours.   ABG Labs (Brief)          Component Value  Date/Time   PHART 7.318 (L) 07/19/2016 2122   HCO3 23.0 07/19/2016 2122   TCO2 20 07/20/2016 1734   ACIDBASEDEF 3.0 (H) 07/19/2016 2122   O2SAT 59.3 07/22/2016 1050     CBG (last 3)  Recent Labs (last 2 labs)   No results for input(s): GLUCAP in the last 72 hours.    Meds Scheduled Meds: . amiodarone  200 mg Oral Q12H   Followed by  . [START ON 07/30/2016] amiodarone  200 mg Oral Daily  . aspirin EC  81 mg Oral Daily  . B-complex with vitamin C  1 tablet Oral Daily  . carvedilol  3.125 mg Oral BID WC  . digoxin  0.125 mg Oral Daily  . docusate sodium  200 mg Oral Daily  . enoxaparin (LOVENOX) injection  40 mg Subcutaneous QHS  . ezetimibe  10 mg Oral Daily  . furosemide  60 mg Intravenous BID  . guaiFENesin  600 mg Oral BID  . losartan  25 mg Oral BID  . multivitamin with minerals  1  tablet Oral Daily  . omega-3 acid ethyl esters  1 capsule Oral BID  . pantoprazole  40 mg Oral QAC breakfast  . potassium chloride  20 mEq Oral Daily  . sodium chloride flush  3 mL Intravenous Q12H  . spironolactone  25 mg Oral Daily   Continuous Infusions:  PRN Meds:.sodium chloride, acetaminophen, bisacodyl **OR** bisacodyl, guaiFENesin, lactulose, levalbuterol, ondansetron **OR** ondansetron (ZOFRAN) IV, oxyCODONE, sodium chloride flush, traMADol  Xrays Imaging Results (Last 48 hours)  No results found.    Assessment/Plan: S/P Procedure(s) (LRB): CORONARY ARTERY BYPASS GRAFTING (CABG) x 4 (LIMA to LAD, SVG to DIAGONAL, SVG to CIRCUMFLEX, SVG to PDA) with EVH from [place] and LEFT INTERNAL MAMMARY ARTERY (N/A) TRANSESOPHAGEAL ECHOCARDIOGRAM (TEE) (N/A) Right sternoclavicular joint plating (Right)   1 progressing well 2 OT has been ordered for hand therapy/poss brachial plexus stretch 3 lasix to po- diuresing well 4 labs stable 5 sinus rhythm, hemodyn stable   LOS: 8 days    GOLD,[name] 07/27/2016  patient examined and medical record  reviewed,agree with above note. [name] 07/27/2016      Revision History

## 2016-07-27 NOTE — Progress Notes (Addendum)
[id number] Patient would like a rollator for home use. OHS d/c education completed with patient including, sternal precautions, deep breathing/coughing, IS use, risk factor modification, heart healthy eating, and activity progression. Reviewed CHF booklet and encouraged patient to watch recovering from OHS and CHF video with wife. Pt verbalizes understanding of instructions given.  Sol Passer, MS, ACSM CCEP

## 2016-07-27 NOTE — Progress Notes (Signed)
CARDIAC REHAB PHASE I   PRE:  Rate/Rhythm: 74 SR  BP:  Supine:   Sitting: 92/52  Standing:    SaO2: 96% RA  MODE:  Ambulation: 600 ft   POST:  Rate/Rhythm: 86 SR  BP:  Supine:   Sitting: 122/52  Standing:    SaO2: 96% RA  0938-1003 Pt tolerated ambulation well with assist x1 and pushing rolling walker. Gait slow, steady, no c/o, VSS. To chair after walk with legs elevated, call bell within reach. Will return this afternoon for education with patient and patient's wife.  [name] [name]

## 2016-07-28 DIAGNOSIS — I25110 Atherosclerotic heart disease of native coronary artery with unstable angina pectoris: Secondary | ICD-10-CM

## 2016-07-28 MED ORDER — LOSARTAN POTASSIUM 25 MG PO TABS
12.5000 mg | ORAL_TABLET | Freq: Two times a day (BID) | ORAL | Status: DC
Start: 2016-07-28 — End: 2016-07-29
  Administered 2016-07-28 – 2016-07-29 (×3): 12.5 mg via ORAL
  Filled 2016-07-28 (×3): qty 1

## 2016-07-28 NOTE — Progress Notes (Signed)
[address]       [place] [address]             [phone]      9 Days Post-Op Procedure(s) (LRB): CORONARY ARTERY BYPASS GRAFTING (CABG) x 4 (LIMA to LAD, SVG to DIAGONAL, SVG to CIRCUMFLEX, SVG to PDA) with EVH from [place] and LEFT INTERNAL MAMMARY ARTERY (N/A) TRANSESOPHAGEAL ECHOCARDIOGRAM (TEE) (N/A) Right sternoclavicular joint plating (Right) Subjective: Feels ok, only c/o is hand issue  Objective: Vital signs in last 24 hours: Temp:  [98.1 F (36.7 C)-98.2 F (36.8 C)] 98.2 F (36.8 C) (07/30 0409) Pulse Rate:  [68-77] 75 (07/30 0409) Cardiac Rhythm: Normal sinus rhythm (07/30 0839) Resp:  [16-18] 16 (07/30 0409) BP: (87-111)/(46-57) 111/57 (07/30 0409) SpO2:  [92 %-100 %] 98 % (07/30 0409) Weight:  [165 lb 6.4 oz (75 kg)] 165 lb 6.4 oz (75 kg) (07/30 0409)  Hemodynamic parameters for last 24 hours:    Intake/Output from previous day: No intake/output data recorded. Intake/Output this shift: No intake/output data recorded.  General appearance: alert, cooperative and no distress Heart: regular rate and rhythm Lungs: clear to auscultation bilaterally Abdomen: benign Extremities: + edema Wound: incis healing well  Lab Results:  Recent Labs  07/26/16 0039 07/27/16 0316  WBC 6.5 8.1  HGB 8.6* 8.6*  HCT 26.0* 26.7*  PLT 243 305   BMET:  Recent Labs  07/26/16 0039 07/27/16 0316  NA 136 136  K 3.9 4.1  CL 95* 95*  CO2 31 32  GLUCOSE 118* 116*  BUN 23* 20  CREATININE 1.09 1.14  CALCIUM 8.7* 8.8*    PT/INR: No results for input(s): LABPROT, INR in the last 72 hours. ABG    Component Value Date/Time   PHART 7.318 (L) 07/19/2016 2122   HCO3 23.0 07/19/2016 2122   TCO2 20 07/20/2016 1734   ACIDBASEDEF 3.0 (H) 07/19/2016 2122   O2SAT 59.3 07/22/2016 1050   CBG (last 3)  No results for input(s): GLUCAP in the last 72 hours.  Meds Scheduled Meds: . amiodarone  200 mg Oral Q12H   Followed by  .  [START ON 07/30/2016] amiodarone  200 mg Oral Daily  . aspirin EC  81 mg Oral Daily  . B-complex with vitamin C  1 tablet Oral Daily  . carvedilol  3.125 mg Oral BID WC  . digoxin  0.125 mg Oral Daily  . docusate sodium  200 mg Oral Daily  . enoxaparin (LOVENOX) injection  40 mg Subcutaneous QHS  . ezetimibe  10 mg Oral Daily  . furosemide  60 mg Oral BID  . guaiFENesin  600 mg Oral BID  . losartan  25 mg Oral BID  . multivitamin with minerals  1 tablet Oral Daily  . omega-3 acid ethyl esters  1 capsule Oral BID  . pantoprazole  40 mg Oral QAC breakfast  . potassium chloride  20 mEq Oral Daily  . sodium chloride flush  3 mL Intravenous Q12H  . spironolactone  25 mg Oral Daily   Continuous Infusions:  PRN Meds:.sodium chloride, acetaminophen, bisacodyl **OR** bisacodyl, guaiFENesin, lactulose, levalbuterol, ondansetron **OR** ondansetron (ZOFRAN) IV, oxyCODONE, sodium chloride flush, traMADol  Xrays No results found.  Assessment/Plan: S/P Procedure(s) (LRB): CORONARY ARTERY BYPASS GRAFTING (CABG) x 4 (LIMA to LAD, SVG to DIAGONAL, SVG to CIRCUMFLEX, SVG to PDA) with EVH from [place] and LEFT INTERNAL MAMMARY ARTERY (N/A) TRANSESOPHAGEAL ECHOCARDIOGRAM (TEE) (N/A) Right sternoclavicular joint  plating (Right)   Conts to progress nicely overall Hopefully OT will see tomorrow and generate a plan for therapy Decrease ARB dose for low BP at times   LOS: 9 days    [name] [name] E 07/28/2016

## 2016-07-28 NOTE — Progress Notes (Signed)
Patient ID: [name] [name], male   DOB: 1936/05/17, 80 y.o.   MRN: [id number]    Advanced Heart Failure Rounding Note   Subjective:    Feels good. No CP or dyspnea. Losartan decreased today due to low BP. Weight stable 165. Maintaining NSR.   Only complaint is hand weakness.    Objective:   Weight Range:  Vital Signs:   Temp:  [98.2 F (36.8 C)] 98.2 F (36.8 C) (07/30 1452) Pulse Rate:  [73-78] 78 (07/30 1452) Resp:  [16-18] 16 (07/30 1452) BP: (91-111)/(50-57) 101/50 (07/30 1452) SpO2:  [92 %-98 %] 98 % (07/30 1452) Weight:  [75 kg (165 lb 6.4 oz)] 75 kg (165 lb 6.4 oz) (07/30 0409) Last BM Date: 07/27/16  Weight change: Filed Weights   07/26/16 0755 07/27/16 0450 07/28/16 0409  Weight: 76.3 kg (168 lb 3.2 oz) 75.1 kg (165 lb 9.6 oz) 75 kg (165 lb 6.4 oz)    Intake/Output:  No intake or output data in the 24 hours ending 07/28/16 1548   Physical Exam: General:  Well appearing. No resp difficulty. In the chair.  HEENT: normal Neck: supple. JVP ~7 Carotids 2+ bilat; no bruits. No lymphadenopathy or thryomegaly appreciated. Cor: Sternotomy scar ok. PMI nondisplaced. Regular rate & rhythm. No rubs, gallops or murmurs. Lungs: clear Abdomen: soft, nontender, distended. No hepatosplenomegaly. No bruits or masses. Good bowel sounds. Extremities: no cyanosis, clubbing, rash, trace edema on R.  Neuro: alert & orientedx3, cranial nerves grossly intact. moves all 4 extremities w/o difficulty. Affect pleasant  Telemetry: NSR 80s   Labs: Basic Metabolic Panel:  Recent Labs Lab 07/22/16 0409 07/22/16 0855 07/23/16 0913 07/24/16 0342 07/25/16 0228 07/26/16 0039 07/27/16 0316  NA 134*  --   --  135 135 136 136  K 4.2  --   --  3.7 4.0 3.9 4.1  CL 106  --   --  101 98* 95* 95*  CO2 25  --   --  '28 30 31 '$ 32  GLUCOSE 105*  --   --  113* 116* 118* 116*  BUN 16  --   --  19 20 23* 20  CREATININE 0.96  --   --  0.99 1.09 1.09 1.14  CALCIUM 7.9*  --   --  8.3* 8.3* 8.7*  8.8*  MG  --  2.0 2.0  --   --   --   --     Liver Function Tests:  Recent Labs Lab 07/26/16 0039  AST 31  ALT 39  ALKPHOS 90  BILITOT 1.5*  PROT 5.6*  ALBUMIN 2.7*   No results for input(s): LIPASE, AMYLASE in the last 168 hours. No results for input(s): AMMONIA in the last 168 hours.  CBC:  Recent Labs Lab 07/22/16 0411 07/24/16 0342 07/26/16 0039 07/27/16 0316  WBC 10.9* 7.2 6.5 8.1  HGB 8.3* 8.7* 8.6* 8.6*  HCT 25.8* 26.9* 26.0* 26.7*  MCV 92.8 93.4 91.5 94.0  PLT 119* 190 243 305    Cardiac Enzymes: No results for input(s): CKTOTAL, CKMB, CKMBINDEX, TROPONINI in the last 168 hours.  BNP: BNP (last 3 results) No results for input(s): BNP in the last 8760 hours.  ProBNP (last 3 results) No results for input(s): PROBNP in the last 8760 hours.    Other results:  Imaging: No results found.   Medications:     Scheduled Medications: . amiodarone  200 mg Oral Q12H   Followed by  . [START ON 07/30/2016] amiodarone  200  mg Oral Daily  . aspirin EC  81 mg Oral Daily  . B-complex with vitamin C  1 tablet Oral Daily  . carvedilol  3.125 mg Oral BID WC  . digoxin  0.125 mg Oral Daily  . docusate sodium  200 mg Oral Daily  . enoxaparin (LOVENOX) injection  40 mg Subcutaneous QHS  . ezetimibe  10 mg Oral Daily  . furosemide  60 mg Oral BID  . guaiFENesin  600 mg Oral BID  . losartan  12.5 mg Oral BID  . multivitamin with minerals  1 tablet Oral Daily  . omega-3 acid ethyl esters  1 capsule Oral BID  . pantoprazole  40 mg Oral QAC breakfast  . potassium chloride  20 mEq Oral Daily  . sodium chloride flush  3 mL Intravenous Q12H  . spironolactone  25 mg Oral Daily    Infusions:    PRN Medications: sodium chloride, acetaminophen, bisacodyl **OR** bisacodyl, guaiFENesin, lactulose, levalbuterol, ondansetron **OR** ondansetron (ZOFRAN) IV, oxyCODONE, sodium chloride flush, traMADol   Assessment/Plan    1. S/P CABG x4 on 07/19/16: On aspirin and  statin.   2. A/C Systolic Heart Failure: Ischemic cardiomyopathy. In the past he was offered ICD however he declined. Intraoperative TEE with reduced EF 15%.   - Volume status looks good. Weight and renal function stable. Continue current regimen. - Continue Coreg 3.125 mg twice a day and spironolactone 25 mg daily.  - Losartan decreased to 12.5 bid due to low BP - Continue digoxin. Will check level in am - Echo in 3 months for ICD, not CRT candidate with narrow QRS.  3. PVCs: Prior in hospital stay ?short burst of Afib 120s. Also having PVCs. Started on amio drip and transitioned to amio 200 mg twice daily. TSH was normal.  Would aim for 1 month amiodarone post-hospital then stop if no further arrhythmias.  4. H/O Non Small Cell Lung CA 5. Brachial plexus injury 6. Will need followup with Dr. Aundra Dubin in CHF clinic.   We will follow at a distance. Please call if any questions.   Length of Stay: 9  Glori Bickers  MD 07/28/2016, 3:48 PM  Advanced Heart Failure Team Pager [phone] (M-F; [place])  Please contact [facility] for night-coverage after hours (4p -7a ) and weekends on [email or web address]

## 2016-07-29 DIAGNOSIS — I5022 Chronic systolic (congestive) heart failure: Secondary | ICD-10-CM

## 2016-07-29 MED ORDER — LOSARTAN POTASSIUM 25 MG PO TABS: 12.5000 mg | ORAL_TABLET | Freq: Two times a day (BID) | ORAL | 1 refills | Status: DC

## 2016-07-29 MED ORDER — SPIRONOLACTONE 25 MG PO TABS: 25.0000 mg | ORAL_TABLET | Freq: Every day | ORAL | 1 refills | Status: DC

## 2016-07-29 MED ORDER — ACETAMINOPHEN 325 MG PO TABS: 650.0000 mg | ORAL_TABLET | Freq: Four times a day (QID) | ORAL | Status: DC | PRN

## 2016-07-29 MED ORDER — FUROSEMIDE 40 MG PO TABS: 60.0000 mg | ORAL_TABLET | Freq: Two times a day (BID) | ORAL | 1 refills | Status: DC

## 2016-07-29 MED ORDER — AMIODARONE HCL 200 MG PO TABS: 200.0000 mg | ORAL_TABLET | Freq: Every day | ORAL | 1 refills | Status: DC

## 2016-07-29 MED ORDER — DIGOXIN 125 MCG PO TABS: 0.1250 mg | ORAL_TABLET | Freq: Every day | ORAL | 1 refills | Status: DC

## 2016-07-29 MED ORDER — TRAMADOL HCL 50 MG PO TABS: 50.0000 mg | ORAL_TABLET | Freq: Four times a day (QID) | ORAL | 0 refills | Status: DC | PRN

## 2016-07-29 MED ORDER — POTASSIUM CHLORIDE CRYS ER 20 MEQ PO TBCR: 20.0000 meq | EXTENDED_RELEASE_TABLET | Freq: Every day | ORAL | 1 refills | Status: DC

## 2016-07-29 MED ORDER — AMIODARONE HCL 200 MG PO TABS
200.0000 mg | ORAL_TABLET | Freq: Every day | ORAL | Status: DC
  Administered 2016-07-29: 200 mg via ORAL
  Filled 2016-07-29: qty 1

## 2016-07-29 NOTE — Care Management Important Message (Signed)
Important Message  Patient Details  Name: [name] [name] MRN: [id number] Date of Birth: [birth year]   Medicare Important Message Given:  Yes    Loann Quill 07/29/2016, 3:18 PM

## 2016-07-29 NOTE — Progress Notes (Signed)
Pt discharging home with wife.  All instructions and prescriptions given and reviewed, all questions answered.

## 2016-07-29 NOTE — Progress Notes (Signed)
Occupational Therapy Evaluation Patient Details Name: [name] [name] MRN: [id number] DOB: 03/12/1936 Today's Date: 07/29/2016    History of Present Illness 80 y.o. male admitted for CABG 07/19/16. PMH of lung mass, BPH, CAD.   Clinical Impression   PTA, pt was independent with all ADLs, IADLs and mobility. Pt currently presents with acute sternal pain and decreased hand function including fine motor coordination and decreased sensation primarily in thumb, 1st, and 2nd digits impacting his ability to complete ADLs. Reviewed sternal precautions and educated pt on fine motor activities to encourage strengthening and coordination. Also provided pt with red and blue thera-foam tubing to build-up eating/grooming utensils and squeeze ball to maintain grip strength. Pt will benefit from continued acute OT to increase independence and safety with ADLs and mobility to allow for safe discharge home. Recommend HHOT for continued hand therapy and ADL retraining.     Follow Up Recommendations  Home health OT;Supervision/Assistance - 24 hour    Equipment Recommendations  None recommended by OT    Recommendations for Other Services       Precautions / Restrictions Precautions Precautions: Sternal Precaution Comments: Pt able to recall all sternal precautions at beginning of session. Pt required min cues for precautions during functional activities Restrictions Weight Bearing Restrictions: Yes (sternal precautions)      Mobility Bed Mobility                  Transfers                 General transfer comment: Pt up in chair on OT arrival    Balance Overall balance assessment: Needs assistance Sitting-balance support: No upper extremity supported;Feet supported Sitting balance-Leahy Scale: Good     Standing balance support: No upper extremity supported;During functional activity Standing balance-Leahy Scale: Good                              ADL Overall  ADL's : Needs assistance/impaired Eating/Feeding: Moderate assistance;Sitting Eating/Feeding Details (indicate cue type and reason): provided red and blue thera-foam for built-up utensils - assist still required to open containers/packages Grooming: Wash/dry hands;Wash/dry face;Supervision/safety;Standing Grooming Details (indicate cue type and reason): provided red and blue thera-foam for built-up grooming utensils Upper Body Bathing: Supervision/ safety;Sitting   Lower Body Bathing: Supervison/ safety;Sit to/from stand   Upper Body Dressing : Minimal assistance;Sitting   Lower Body Dressing: Moderate assistance;Sit to/from stand   Toilet Transfer: Supervision/safety;Regular Toilet;RW   Toileting- Water quality scientist and Hygiene: Supervision/safety;Sit to/from stand       Functional mobility during ADLs: Supervision/safety;Rolling walker General ADL Comments: Assist primarily due to impaired bilateral hand function     Vision Vision Assessment?: No apparent visual deficits   Perception     Praxis      Pertinent Vitals/Pain Pain Assessment: 0-10 Pain Score: 2  Pain Location: incision site/chest Pain Descriptors / Indicators: Sore Pain Intervention(s): Repositioned;Premedicated before session;Monitored during session     Hand Dominance Right   Extremity/Trunk Assessment Upper Extremity Assessment Upper Extremity Assessment: RUE deficits/detail;LUE deficits/detail RUE Deficits / Details: limited flexion/extension 1st & 2nd digits, significantly impaired fine motor coordination, sensation (light touch) impaired - worse on dorsum of hand and thumb/1st/2nd finger tips; overall strength wfl including grip strength, sensation (light touch) impaired worse in thumb/1st/2nd finger tips; overall strength wfl including grip strength RUE Sensation: decreased light touch RUE Coordination: decreased fine motor LUE Deficits / Details: slight limited 1st/2nd digit flexion/extension,  mild fine motor coordination impairment,  LUE Sensation: decreased light touch LUE Coordination: decreased fine motor   Lower Extremity Assessment Lower Extremity Assessment: Overall WFL for tasks assessed   Cervical / Trunk Assessment Cervical / Trunk Assessment: Normal   Communication Communication Communication: No difficulties   Cognition Arousal/Alertness: Awake/alert Behavior During Therapy: WFL for tasks assessed/performed Overall Cognitive Status: Within Functional Limits for tasks assessed                     General Comments       Exercises       Shoulder Instructions      Home Living Family/patient expects to be discharged to:: Private residence Living Arrangements: Spouse/significant other Available Help at Discharge: Family;Available 24 hours/day Type of Home: House       Home Layout: One level     Bathroom Shower/Tub: Occupational psychologist: Standard     Home Equipment: Environmental consultant - 2 wheels;Bedside commode;Hand held shower head;Adaptive equipment;Shower Theme park manager: Reacher        Prior Functioning/Environment Level of Independence: Independent        Comments: Drives, loves to do yard work particularly cutting down trees    OT Diagnosis: Acute pain;Other (comment) (Impaired hand function and sensation)   OT Problem List: Decreased strength;Decreased range of motion;Impaired balance (sitting and/or standing);Decreased activity tolerance;Decreased coordination;Cardiopulmonary status limiting activity;Impaired UE functional use;Pain;Impaired sensation   OT Treatment/Interventions: Self-care/ADL training;Therapeutic exercise;Therapeutic activities;Patient/family education;Balance training;DME and/or AE instruction;Neuromuscular education    OT Goals(Current goals can be found in the care plan section) Acute Rehab OT Goals Patient Stated Goal: to get back to normal OT Goal Formulation: With patient Time For Goal  Achievement: 08/12/16 Potential to Achieve Goals: Good ADL Goals Pt Will Perform Eating: with modified independence;with adaptive utensils;sitting Pt Will Perform Grooming: with modified independence;sitting (with red thera-foam tubing for built-up handles on utensils) Pt Will Perform Upper Body Dressing: with modified independence;sitting Pt Will Perform Lower Body Dressing: with modified independence;sit to/from stand Pt/caregiver will Perform Home Exercise Program: Increased ROM;Increased strength;Both right and left upper extremity;With written HEP provided;Independently Additional ADL Goal #1: Pt will demonstrate adherence to all sternal precautions with no verbal cues.  OT Frequency: Min 3X/week   Barriers to D/C:            Co-evaluation              End of Session Equipment Utilized During Treatment: Rolling walker Nurse Communication: Mobility status  Activity Tolerance: Patient tolerated treatment well Patient left: in chair;with call bell/phone within reach   Time: 3094-0768 OT Time Calculation (min): 40 min Charges:  OT General Charges $OT Visit: 1 Procedure OT Evaluation $OT Eval Moderate Complexity: 1 Procedure OT Treatments $Self Care/Home Management : 8-22 mins $Therapeutic Activity: 8-22 mins G-Codes:    Redmond Baseman, OTR/L Pager: [phone] 07/29/2016, 11:08 AM

## 2016-07-29 NOTE — Progress Notes (Signed)
Patient ID: [name] [name], male   DOB: 01-28-1936, 80 y.o.   MRN: [id number]    Advanced Heart Failure Rounding Note   Subjective:    Losartan decreased 07/28/16 due to low BP.   Feels good today.  Denies lightheadedness, dizziness, CP, or SOB. Still feels like he has some "wheezing".  Primary complaint is Hand weakness.  Currently being evaluated by OT.   Weight shows down 5 lbs but ? Accuracy. Remains in NSR.   Objective:   Weight Range:  Vital Signs:   Temp:  [97.7 F (36.5 C)-98.2 F (36.8 C)] 97.7 F (36.5 C) (07/31 0327) Pulse Rate:  [78-84] 84 (07/31 0327) Resp:  [16-18] 18 (07/31 0327) BP: (101-129)/(50-65) 129/63 (07/31 0327) SpO2:  [97 %-98 %] 97 % (07/31 0327) Weight:  [160 lb 14.4 oz (73 kg)] 160 lb 14.4 oz (73 kg) (07/31 0215) Last BM Date: 07/27/16  Weight change: Filed Weights   07/27/16 0450 07/28/16 0409 07/29/16 0215  Weight: 165 lb 9.6 oz (75.1 kg) 165 lb 6.4 oz (75 kg) 160 lb 14.4 oz (73 kg)    Intake/Output:  No intake or output data in the 24 hours ending 07/29/16 0910   Physical Exam: General:  Well appearing. No resp difficulty. In the chair.  HEENT: normal Neck: supple. JVP 6-7 Carotids 2+ bilat; no bruits. No thyromegaly or nodule noted.  Cor: Sternotomy scar C/D/I. PMI nondisplaced. Regular rate & rhythm. No M/G/R Lungs: CTAB, normal effort. No wheezing appreciated. Abdomen: soft, NT, ND, no HSM. No bruits or masses. +BS  Extremities: no cyanosis, clubbing, rash, trace edema on R.  Neuro: alert & orientedx3, cranial nerves grossly intact. moves all 4 extremities w/o difficulty. Affect pleasant  Telemetry: NSR 80s   Labs: Basic Metabolic Panel:  Recent Labs Lab 07/23/16 0913  07/24/16 0342 07/25/16 0228 07/26/16 0039 07/27/16 0316  NA  --   --  135 135 136 136  K  --   --  3.7 4.0 3.9 4.1  CL  --   --  101 98* 95* 95*  CO2  --   --  '28 30 31 '$ 32  GLUCOSE  --   --  113* 116* 118* 116*  BUN  --   --  19 20 23* 20  CREATININE   --   --  0.99 1.09 1.09 1.14  CALCIUM  --   < > 8.3* 8.3* 8.7* 8.8*  MG 2.0  --   --   --   --   --   < > = values in this interval not displayed.  Liver Function Tests:  Recent Labs Lab 07/26/16 0039  AST 31  ALT 39  ALKPHOS 90  BILITOT 1.5*  PROT 5.6*  ALBUMIN 2.7*   No results for input(s): LIPASE, AMYLASE in the last 168 hours. No results for input(s): AMMONIA in the last 168 hours.  CBC:  Recent Labs Lab 07/24/16 0342 07/26/16 0039 07/27/16 0316  WBC 7.2 6.5 8.1  HGB 8.7* 8.6* 8.6*  HCT 26.9* 26.0* 26.7*  MCV 93.4 91.5 94.0  PLT 190 243 305    Cardiac Enzymes: No results for input(s): CKTOTAL, CKMB, CKMBINDEX, TROPONINI in the last 168 hours.  BNP: BNP (last 3 results) No results for input(s): BNP in the last 8760 hours.  ProBNP (last 3 results) No results for input(s): PROBNP in the last 8760 hours.    Other results:  Imaging: No results found.   Medications:     Scheduled Medications: .  amiodarone  200 mg Oral Daily  . aspirin EC  81 mg Oral Daily  . B-complex with vitamin C  1 tablet Oral Daily  . carvedilol  3.125 mg Oral BID WC  . digoxin  0.125 mg Oral Daily  . docusate sodium  200 mg Oral Daily  . enoxaparin (LOVENOX) injection  40 mg Subcutaneous QHS  . ezetimibe  10 mg Oral Daily  . furosemide  60 mg Oral BID  . guaiFENesin  600 mg Oral BID  . losartan  12.5 mg Oral BID  . multivitamin with minerals  1 tablet Oral Daily  . omega-3 acid ethyl esters  1 capsule Oral BID  . pantoprazole  40 mg Oral QAC breakfast  . potassium chloride  20 mEq Oral Daily  . sodium chloride flush  3 mL Intravenous Q12H  . spironolactone  25 mg Oral Daily    Infusions:    PRN Medications: sodium chloride, acetaminophen, bisacodyl **OR** bisacodyl, guaiFENesin, lactulose, levalbuterol, ondansetron **OR** ondansetron (ZOFRAN) IV, oxyCODONE, sodium chloride flush, traMADol   Assessment/Plan    1. S/P CABG x4 on 07/19/16: On aspirin and statin.     2. A/C Systolic Heart Failure: Ischemic cardiomyopathy. In the past he was offered ICD however he declined. Intraoperative TEE with reduced EF 15%.   - Volume status stable.  Weight and renal function stable. Continue current regimen. - Increase Coreg to 6.25 mg bid.  - Continue spironolactone 25 mg daily.  - Continue Losartan 12.5 bid  (decreased from 25 with low BP) - Continue digoxin. Level not ordered. Will schedule for tomorrow am prior to administration.  - Echo in 3 months for ICD, not CRT candidate with narrow QRS.  - With drop in pressure yesterday will leave meds for now, and plan up-titration as outpatient.  3. PVCs: Prior in hospital stay ?short burst of Afib 120s. Also having PVCs. Started on amio drip and transitioned to amio 200 mg daily. TSH was normal.   - Would plan for 1 month of amiodarone post-hospital then stop if no further arrhythmias.  4. H/O Non Small Cell Lung CA 5. Brachial plexus injury  We will continue to follow this admission, and see as outpatient in CHF clinic.   Possibly home today vs tomorrow pending OT evaluation of Hand weakness.   Length of Stay: [address]  Annamaria Helling 07/29/2016, 9:10 AM  Advanced Heart Failure Team Pager [phone] (M-F; 7a - 4p)  Please contact [facility] for night-coverage after hours (4p -7a ) and weekends on [email or web address]   Patient seen with PA, agree with the above note.  Will increase Coreg to 6.25 mg bid today.  Probably home in am.  Volume status looks ok.   Loralie Champagne 07/29/2016 12:56 PM

## 2016-07-29 NOTE — Care Management Note (Addendum)
Case Management Note Marvetta Gibbons RN, BSN Unit 2W-Case Manager [phone]  Patient Details  Name: [name] [name] MRN: [id number] Date of Birth: Mar 03, 1936  Subjective/Objective:  Pt admitted s/p CABG                  Action/Plan: PTA pt lived at home, anticipate return home- [facility] referral made for f/u after discharge- CM to follow for any d/c needs  Expected Discharge Date:                  Expected Discharge Plan:  [place]  In-House Referral:     Discharge planning Services  CM Consult  Post Acute Care Choice:    Choice offered to:  Patient  DME Arranged:  Walker rolling with seat DME Agency:  [place] Arranged:  PT, OT [facility] Agency:  [address]  Status of Service:  In process, will continue to follow  If discussed at Long Length of Stay Meetings, dates discussed:    Additional Comments: 07/29/2016 Pt alert and oriented during CM assessment.  Per pt; wife will provide 24 hour supervision post discharge.  OT at bedside ; recommending both OT and PT for [facility].  CM offered pt choice for [facility] and DME ; pt chose [facility] for both, agency contacted and referrals accepted.   Maryclare Labrador, RN 07/29/2016, 10:34 AM

## 2016-07-29 NOTE — Progress Notes (Addendum)
      [address]       [name] [address]             [phone]        10 Days Post-Op Procedure(s) (LRB): CORONARY ARTERY BYPASS GRAFTING (CABG) x 4 (LIMA to LAD, SVG to DIAGONAL, SVG to CIRCUMFLEX, SVG to PDA) with EVH from [place] and LEFT INTERNAL MAMMARY ARTERY (N/A) TRANSESOPHAGEAL ECHOCARDIOGRAM (TEE) (N/A) Right sternoclavicular joint plating (Right)  Subjective: Patient still with complaints of not being able to use his hands properly  Objective: Vital signs in last 24 hours: Temp:  [97.7 F (36.5 C)-98.2 F (36.8 C)] 97.7 F (36.5 C) (07/31 0327) Pulse Rate:  [78-84] 84 (07/31 0327) Cardiac Rhythm: Normal sinus rhythm (07/30 2023) Resp:  [16-18] 18 (07/31 0327) BP: (101-129)/(50-65) 129/63 (07/31 0327) SpO2:  [97 %-98 %] 97 % (07/31 0327) Weight:  [160 lb 14.4 oz (73 kg)] 160 lb 14.4 oz (73 kg) (07/31 0215)  Pre op weight 73 kg Current Weight  07/29/16 160 lb 14.4 oz (73 kg)      Intake/Output from previous day: No intake/output data recorded.   Physical Exam:  Cardiovascular: RRR, no murmur Pulmonary: Mostly clear Abdomen: Soft, non tender,bowel sounds present. Extremities: Decreased bilateral lower extremity [place] Ecchymosis right thigh. Motor/sensory intact UE/hands Wounds: Sternal wound is clean and dry.  No erythema or signs of infection.  Lab Results: CBC:  Recent Labs  07/27/16 0316  WBC 8.1  HGB 8.6*  HCT 26.7*  PLT 305   BMET:   Recent Labs  07/27/16 0316  NA 136  K 4.1  CL 95*  CO2 32  GLUCOSE 116*  BUN 20  CREATININE 1.14  CALCIUM 8.8*    PT/INR:  Lab Results  Component Value Date   INR 1.50 (H) 07/19/2016   INR 1.06 07/17/2016   INR 1.21 06/24/2012   ABG:  INR: Will add last result for INR, ABG once components are confirmed Will add last 4 CBG results once components are confirmed  Assessment/Plan:  1. CV - Previous a fib, PVCs. Has ischemic cardiomyopathy (LVEF 15%).  Pre iously declined ICD.Maintaining SR. On Amiodarone 200 mg bid, Coreg 3.125 mg bid, Digoxin 0.125 mg daily, Spironolactone 25 mg daily, Cozaar 12.5 mg bid. QTc 454 this am so will decrease Amiodarone to 200 mg daily. 2.  Pulmonary - On room air. Encourage incentive spirometer. 3. Volume Overload - On Lasix 60 mg PO bid. Per heart failure. 4.  Acute blood loss anemia - H and H stable at 8.6 and 26 5. OT needs to evaluate patient regarding "hand issues"   [name] [name],[name] [name] MPA-C 07/29/2016,7:24 AM    Patient seen by OT,  Patient anxious to go home [facility] for D/C this afternoon Fu with me, Heart failure service and Dr Gigi Gin / Selena Lesser

## 2016-07-29 NOTE — Progress Notes (Signed)
CT sutures removed.  Sites unremarkable.  Painted and steri's applied.  Pt understands they are to be left alone and will fall off.

## 2016-07-31 ENCOUNTER — Other Ambulatory Visit: Payer: Self-pay

## 2016-07-31 DIAGNOSIS — N400 Enlarged prostate without lower urinary tract symptoms: Secondary | ICD-10-CM | POA: Diagnosis not present

## 2016-07-31 DIAGNOSIS — Z48812 Encounter for surgical aftercare following surgery on the circulatory system: Secondary | ICD-10-CM | POA: Diagnosis not present

## 2016-07-31 DIAGNOSIS — I2510 Atherosclerotic heart disease of native coronary artery without angina pectoris: Secondary | ICD-10-CM | POA: Diagnosis not present

## 2016-07-31 DIAGNOSIS — I255 Ischemic cardiomyopathy: Secondary | ICD-10-CM | POA: Diagnosis not present

## 2016-07-31 DIAGNOSIS — I10 Essential (primary) hypertension: Secondary | ICD-10-CM | POA: Diagnosis not present

## 2016-07-31 NOTE — Patient Outreach (Signed)
Transition of care call:  Placed call to patient and explained reason for call. Patient reports his main concerns is his arms. States that he has nerve damage from surgery. States that he has pain , numbness and inability to use arms correctly.  Reports that he is managing well with his heart surgery.  Patient reports that he has had physical therapy come to the home but has not yet heard from occupational therapy.   Reviewed pending appointment and discharge medications.  Patient and wife verbalize being overwhelmed with so many appointments.  PLAN:  Reviewed with patient that I would be outreaching him weekly. Offered home visit for August 9th and patient and wife have accepted.  Confirmed address and provided my name and contact information.  Will send this note to MD and barrier letter.  [facility] CM Care Plan Problem One   Flowsheet Row Most Recent Value  Care Plan Problem One  Recent admission for CABG.  Role Documenting the Problem One  Care Management [place] for Problem One  Active  [facility]N Long Term Goal (31-90 days)  Patient will report no readmissions in the next 31 days.  THN Long Term Goal Start Date  07/31/16  Interventions for Problem One Long Term Goal  Reviewed discharge instructions and medications. Scheduled home visit.  THN CM Short Term Goal #1 (0-30 days)  Paitent will report being contacted by occupational therapist within the 7 days  THN CM Short Term Goal #1 Start Date  07/31/16  Interventions for Short Term Goal #1  Placed call to [facility] to inquire about OT order.  THN CM Short Term Goal #2 (0-30 days)  Patient will report increase in strength in hands within the next 30 days.  THN CM Short Term Goal #2 Start Date  07/31/16  Interventions for Short Term Goal #2  Encouraged patient to due exercises bases on physical therapist recommendations. Encouraged patient to take pain medications when needed. Encouraged patient to call MD for any concerns. Home  visit planned in 1 week.      Tomasa Rand, RN, BSN, CEN [facility] [employer] [phone]

## 2016-08-01 ENCOUNTER — Other Ambulatory Visit: Payer: Self-pay

## 2016-08-01 NOTE — Patient Outreach (Signed)
Care Coordination:  Received a voicemail from [facility] Client to state that patient who received Occupational Therapy on Saturday 08/03/2016.  Placed call to patient and spoke with wife and provided above update. Call received from Leland at [facility] to state they have also engaged patient for transition of care. I provided information that I have a home visit planned for next week.  PLAN: will continue to assess needs. Will see patient as planned on 08/07/2016.  Tomasa Rand, RN, BSN, CEN [facility] [employer] [phone]

## 2016-08-07 ENCOUNTER — Other Ambulatory Visit: Payer: Self-pay

## 2016-08-07 NOTE — Patient Outreach (Signed)
[facility]) Care Management   08/07/2016  [name] [name] Dec 20, 1936 [id number]  [name] [name] is an 80 y.o. male Arrived for home visit. Wife present and engaged in home visit. Patient sitting in recliner. Subjective:  Patient reports that he is improving since hospital discharge. Reports that he is able to use his hands more. Reports being active with home health physical therapy and occupational therapy. Patient reports that he has follow up planned with primary MD this week. Has planned follow up with cardiology and surgeon. Patient reports that he continues to hear a clicking sound in his chest from surgery. Reports that he has started to walk some but is very careful about lifting anything heavy. Patient reports that he is currently sleeping in his recliner for comfort reasons. Denies any swelling. Reports that he has lost about 13 pounds since surgery.  Patient reports wife is very supportive and is assisting with any and all activities.   Objective:  Midline incision healing. No redness and no drainage. Right leg surgical site healing without any signs of infection.  Bruising not to right thigh. Ambulates well in the home without use of assistive devices. Steady gait.  Vitals:   08/07/16 1306  BP: (!) 106/56  Pulse: 79  SpO2: 98%  Weight: 149 lb 6.4 oz (67.8 kg)  Height: 1.626 m ('5\' 4"'$ )   Review of Systems  Constitutional: Negative.   HENT: Negative.   Eyes: Negative.   Respiratory: Negative.  Negative for shortness of breath.   Cardiovascular: Positive for palpitations. Negative for orthopnea and leg swelling.       Reports chest tender to the touch. Reports hearing clicking of bones.  Gastrointestinal: Negative.   Genitourinary: Positive for frequency.  Musculoskeletal:       Reports chest wall pain and arm pain.  Skin:       Chest incision to midline, Right leg incisions healing  Neurological: Positive for focal weakness.       Unable to grasp things  with hands since surgery. Unable to button buttons when dressing  Endo/Heme/Allergies: Bruises/bleeds easily.  Psychiatric/Behavioral: Negative.     Physical Exam  Constitutional: He is oriented to person, place, and time. He appears well-developed and well-nourished.  Cardiovascular: Normal rate, regular rhythm, normal heart sounds and intact distal pulses.   Respiratory: Effort normal and breath sounds normal.  GI: Soft. Bowel sounds are normal.  Musculoskeletal: Normal range of motion. He exhibits no edema.  Grips equal and strong  Neurological: He is alert and oriented to person, place, and time.  Skin: Skin is warm and dry.  Midline chest incision healing, right leg incisions healing. No skin breakdown on feet.  Psychiatric: He has a normal mood and affect. His behavior is normal. Judgment and thought content normal.    Encounter Medications:   Outpatient Encounter Prescriptions as of 08/07/2016  Medication Sig  . acetaminophen (TYLENOL) 325 MG tablet Take 2 tablets (650 mg total) by mouth every 6 (six) hours as needed for mild pain.  Marland Kitchen amiodarone (PACERONE) 200 MG tablet Take 1 tablet (200 mg total) by mouth daily.  Marland Kitchen aspirin 81 MG tablet Take 81 mg by mouth daily.  Marland Kitchen b complex vitamins tablet Take 1 tablet by mouth daily.  . carvedilol (COREG) 3.125 MG tablet Take 1 tablet (3.125 mg total) by mouth 2 (two) times daily.  . digoxin (LANOXIN) 0.125 MG tablet Take 1 tablet (0.125 mg total) by mouth daily.  Marland Kitchen ezetimibe (ZETIA) 10 MG  tablet Take 10 mg by mouth daily.  . furosemide (LASIX) 40 MG tablet Take 1.5 tablets (60 mg total) by mouth 2 (two) times daily.  Marland Kitchen losartan (COZAAR) 25 MG tablet Take 0.5 tablets (12.5 mg total) by mouth 2 (two) times daily.  . Multiple Vitamin (MULTIVITAMIN WITH MINERALS) TABS tablet Take 1 tablet by mouth daily.  . Omega 3 1200 MG CAPS Take 1 capsule by mouth 2 (two) times daily.   . potassium chloride SA (K-DUR,KLOR-CON) 20 MEQ tablet Take 1 tablet  (20 mEq total) by mouth daily.  Marland Kitchen spironolactone (ALDACTONE) 25 MG tablet Take 1 tablet (25 mg total) by mouth daily.  . traMADol (ULTRAM) 50 MG tablet Take 1 tablet (50 mg total) by mouth every 6 (six) hours as needed for moderate pain. (Patient not taking: Reported on 07/31/2016)   No facility-administered encounter medications on file as of 08/07/2016.     Functional Status:   In your present state of health, do you have any difficulty performing the following activities: 08/07/2016 07/19/2016  Hearing? N N  Vision? Y N  Difficulty concentrating or making decisions? N N  Walking or climbing stairs? Y N  Dressing or bathing? Y N  Doing errands, shopping? Y N  Preparing Food and eating ? N -  Using the Toilet? N -  In the past six months, have you accidently leaked urine? Y -  Do you have problems with loss of bowel control? N -  Managing your Medications? Y -  Managing your Finances? N -  Housekeeping or managing your Housekeeping? N -  Some recent data might be hidden    Fall/Depression Screening:    PHQ 2/9 Scores 08/07/2016  PHQ - 2 Score 0   Fall Risk  08/07/2016 10/10/2014  Falls in the past year? No No    Assessment:   (1) explained [facility] transition of care program.  Patient has [facility] packet. Reviewed consent and patient wanted to add sons. New consent obtained.  Provided my contact card. Reviewed 24 hour nurse line. (2) healing well from surgery. Reports improved strength. (3) has scheduled follow up appointments. (4) unable to grip thing with hands. Reports that this is improving.   Plan:  (1) consent scanned into chart. Reviewed with patient and his wife weekly outreaches for follow on progress. Reviewed with patient and wife that Landis Martins RN would be calling next week. Encouraged patient and or wife to call MD for any concerns. (2) Encouraged patient to be active based on parameters set by MD. Encouraged hand exercises as provided by occupational therapist. (3) encouraged  patient to follow up as plannned. (4) Encouraged patient to continue to work on improving hand strength.  Encouraged patient to report any concerning symptoms or problems.   Care planning and goal setting during home visit and primary goal is avoid readmission.   Next out reach in 1 week by Landis Martins RN. This home visit sent to MD.  [facility] CM Care Plan Problem One   Flowsheet Row Most Recent Value  Care Plan Problem One  Recent admission for CABG.  Role Documenting the Problem One  Care Management [place] for Problem One  Active  [facility]N Long Term Goal (31-90 days)  Patient will report no readmissions in the next 31 days.  THN Long Term Goal Start Date  07/31/16  Interventions for Problem One Long Term Goal  Home visit completed. Encouraged patient to follow CABG post op recommendations. Emmi education provided.  THN CM Short Term Goal #1 (0-30 days)  Paitent will report being contacted by occupational therapist within the 7 days  THN CM Short Term Goal #1 Start Date  07/31/16  [facility] CM Short Term Goal #1 Met Date  08/07/16  Interventions for Short Term Goal #1  Placed call to [facility] to inquire about OT order.  THN CM Short Term Goal #2 (0-30 days)  Patient will report increase in strength in hands within the next 30 days.  THN CM Short Term Goal #2 Start Date  07/31/16  Interventions for Short Term Goal #2  Provided support and encouragement to continue to work on improving strength in hands.     Tomasa Rand, RN, BSN, CEN [facility] [employer] [phone]

## 2016-08-08 DIAGNOSIS — I10 Essential (primary) hypertension: Secondary | ICD-10-CM | POA: Diagnosis not present

## 2016-08-08 DIAGNOSIS — R7303 Prediabetes: Secondary | ICD-10-CM | POA: Diagnosis not present

## 2016-08-09 DIAGNOSIS — I25810 Atherosclerosis of coronary artery bypass graft(s) without angina pectoris: Secondary | ICD-10-CM | POA: Diagnosis not present

## 2016-08-09 DIAGNOSIS — I480 Paroxysmal atrial fibrillation: Secondary | ICD-10-CM | POA: Diagnosis not present

## 2016-08-09 DIAGNOSIS — N179 Acute kidney failure, unspecified: Secondary | ICD-10-CM | POA: Diagnosis not present

## 2016-08-09 DIAGNOSIS — I255 Ischemic cardiomyopathy: Secondary | ICD-10-CM | POA: Diagnosis not present

## 2016-08-12 ENCOUNTER — Encounter (HOSPITAL_COMMUNITY): Payer: Self-pay

## 2016-08-12 ENCOUNTER — Ambulatory Visit (HOSPITAL_COMMUNITY)
Admit: 2016-08-12 | Discharge: 2016-08-12 | Disposition: A | Discharge status: Home / Self Care | Payer: PPO | Source: Ambulatory Visit | Attending: Cardiology | Admitting: Cardiology

## 2016-08-12 VITALS — BP 116/60 | HR 77 | Wt 155.5 lb

## 2016-08-12 DIAGNOSIS — Z79899 Other long term (current) drug therapy: Secondary | ICD-10-CM | POA: Insufficient documentation

## 2016-08-12 DIAGNOSIS — Z7982 Long term (current) use of aspirin: Secondary | ICD-10-CM | POA: Insufficient documentation

## 2016-08-12 DIAGNOSIS — I2510 Atherosclerotic heart disease of native coronary artery without angina pectoris: Secondary | ICD-10-CM | POA: Insufficient documentation

## 2016-08-12 DIAGNOSIS — N183 Chronic kidney disease, stage 3 (moderate): Secondary | ICD-10-CM | POA: Diagnosis not present

## 2016-08-12 DIAGNOSIS — I519 Heart disease, unspecified: Secondary | ICD-10-CM | POA: Diagnosis not present

## 2016-08-12 DIAGNOSIS — Z833 Family history of diabetes mellitus: Secondary | ICD-10-CM | POA: Insufficient documentation

## 2016-08-12 DIAGNOSIS — N179 Acute kidney failure, unspecified: Secondary | ICD-10-CM | POA: Diagnosis not present

## 2016-08-12 DIAGNOSIS — E785 Hyperlipidemia, unspecified: Secondary | ICD-10-CM | POA: Insufficient documentation

## 2016-08-12 DIAGNOSIS — I5022 Chronic systolic (congestive) heart failure: Secondary | ICD-10-CM | POA: Insufficient documentation

## 2016-08-12 DIAGNOSIS — I255 Ischemic cardiomyopathy: Secondary | ICD-10-CM | POA: Diagnosis not present

## 2016-08-12 DIAGNOSIS — Z8249 Family history of ischemic heart disease and other diseases of the circulatory system: Secondary | ICD-10-CM | POA: Diagnosis not present

## 2016-08-12 DIAGNOSIS — I5189 Other ill-defined heart diseases: Secondary | ICD-10-CM

## 2016-08-12 DIAGNOSIS — Z951 Presence of aortocoronary bypass graft: Secondary | ICD-10-CM | POA: Diagnosis not present

## 2016-08-12 DIAGNOSIS — Z87891 Personal history of nicotine dependence: Secondary | ICD-10-CM | POA: Diagnosis not present

## 2016-08-12 DIAGNOSIS — Z85118 Personal history of other malignant neoplasm of bronchus and lung: Secondary | ICD-10-CM | POA: Diagnosis not present

## 2016-08-12 DIAGNOSIS — Z85828 Personal history of other malignant neoplasm of skin: Secondary | ICD-10-CM | POA: Diagnosis not present

## 2016-08-12 LAB — BASIC METABOLIC PANEL
ANION GAP: 12 (ref 5–15)
BUN: 27 mg/dL — ABNORMAL HIGH (ref 6–20)
CALCIUM: 9.6 mg/dL (ref 8.9–10.3)
CO2: 21 mmol/L — ABNORMAL LOW (ref 22–32)
Chloride: 103 mmol/L (ref 101–111)
Creatinine, Ser: 1.12 mg/dL (ref 0.61–1.24)
GLUCOSE: 157 mg/dL — AB (ref 65–99)
Potassium: 4.8 mmol/L (ref 3.5–5.1)
Sodium: 136 mmol/L (ref 135–145)

## 2016-08-12 LAB — DIGOXIN LEVEL: Digoxin Level: 1.6 ng/mL (ref 0.8–2.0)

## 2016-08-12 NOTE — Progress Notes (Signed)
PCP: Dr Lavone Orn HF Cardiology: Dr. Aundra Dubin Cardiac surgery: Dr. Servando Snare  80 yo with history of CAD s/p CABG, ischemic cardiomyopathy, and lung cancer s/p resection in 2013 presents for cardiology followup after recent hospitalization for CABG.  He was followed in the past by Dr Radford Pax.  EF was noted to be 35% in 9/16 by cMRI.  He had a high risk stress test in 6/17 that was followed by cath showing 3 VD.  He had CABG x 4 on 07/19/16.  TEE at the time of CABG showed EF 15%.    Post-op, he was initially hypotensive but this resolved.  He was markedly volume overloaded and was diuresed extensively.  He went home from the hospital on Lasix 60 mg po bid and weight continued to drop.  He was seen by his PCP last week, and creatinine was noted to be up to 1.7.  Losartan, Lasix, and KCl were stopped last Friday.  Weight has stayed down.  He is feeling good overall. No significant dyspnea.  He is walking up and down his driveway for exercise.  No lightheadedness or syncope.  Some soreness at incision site.  He has some numbness in the first and 2nd fingers of his right hand, this has been present since the surgery.  No orthopnea/PND.  Labs done today showed creatinine down to 1.12.   ECG: NSR, LVH with repolarization abnormality  Labs (07/27/16): K 4.1, creatinine 1.14 Labs (08/08/16): K 5.4, creatinine 1.72 Labs (08/12/16): K 4.8, creatinine 1.12, digoxin 1.6  PMH: 1. CAD: CABG (07/19/16) with LIMA-LAD, SVG-D, SVG-OM, SVG-PDA following high risk stress Cardiolite.  2. Chronic systolic CHF: Ischemic cardiomyopathy.  - Cardiac MRI (9/16) with EF 35%, normal RV.  - Cardiolite (6/17) with EF 21%.  - TEE (7/17, in OR) with EF 15%.  3. H/o non-small cell lung CA, s/p resection in 2013. 4. BPH 5. PVCs 6. Atrial fibrillation: Noted only post-op in 7/17.  7. Skin cancers 8. Hyperlipidemia: Muscle pain and confusion with statin use.   Social History   Social History  . Marital status: Married    Spouse  name: N/A  . Number of children: N/A  . Years of education: N/A   Occupational History  . Not on file.   Social History Main Topics  . Smoking status: Former Smoker    Types: Cigarettes    Quit date: 12/30/1965  . Smokeless tobacco: Never Used  . Alcohol use 3.0 - 3.6 oz/week    3 - 4 Glasses of wine, 2 Cans of beer per week     Comment: infrequently- "indulge heavly when I do"  . Drug use: No  . Sexual activity: Not on file   Other Topics Concern  . Not on file   Social History Narrative  . No narrative on file   Family History  Problem Relation Age of Onset  . Diabetes Father   . Psoriasis Child   . Heart disease Child     resuscitated from cardiac arrest from Brugada's syndrome  . Lung cancer Mother   . Heart disease Mother    ROS: All systems reviewed and negative except as per HPI.   Current Outpatient Prescriptions  Medication Sig Dispense Refill  . acetaminophen (TYLENOL) 325 MG tablet Take 2 tablets (650 mg total) by mouth every 6 (six) hours as needed for mild pain.    Marland Kitchen amiodarone (PACERONE) 200 MG tablet Take 1 tablet (200 mg total) by mouth daily. 30 tablet 1  .  aspirin 81 MG tablet Take 81 mg by mouth daily.    Marland Kitchen b complex vitamins tablet Take 1 tablet by mouth daily.    . carvedilol (COREG) 3.125 MG tablet Take 1 tablet (3.125 mg total) by mouth 2 (two) times daily. 180 tablet 3  . digoxin (LANOXIN) 0.125 MG tablet Take 1 tablet (0.125 mg total) by mouth daily. 30 tablet 1  . ezetimibe (ZETIA) 10 MG tablet Take 10 mg by mouth daily.    . Multiple Vitamin (MULTIVITAMIN WITH MINERALS) TABS tablet Take 1 tablet by mouth daily.    . Omega 3 1200 MG CAPS Take 1 capsule by mouth 2 (two) times daily.     Marland Kitchen spironolactone (ALDACTONE) 25 MG tablet Take 1 tablet (25 mg total) by mouth daily. 30 tablet 1  . traMADol (ULTRAM) 50 MG tablet Take 1 tablet (50 mg total) by mouth every 6 (six) hours as needed for moderate pain. (Patient not taking: Reported on 07/31/2016) 30  tablet 0   No current facility-administered medications for this encounter.    BP 116/60   Pulse 77   Wt 155 lb 8 oz (70.5 kg)   SpO2 100%   BMI 26.69 kg/m  General: NAD Neck: No JVD, no thyromegaly or thyroid nodule.  Lungs: Clear to auscultation bilaterally with normal respiratory effort. CV: Nondisplaced PMI.  Heart regular S1/S2, no S3/S4, no murmur.  No peripheral edema.  No carotid bruit.  Normal pedal pulses.  Abdomen: Soft, nontender, no hepatosplenomegaly, no distention.  Skin: Intact without lesions or rashes.  Neurologic: Alert and oriented x 3.  Psych: Normal affect. Extremities: No clubbing or cyanosis.  HEENT: Normal.   Assessment/Plan: 1. CAD: s/p CABG.  No ischemic chest pain.  - Continue ASA 81 and Zetia (has not been able to tolerate statins. - Start cardiac rehab, wants to do this at [facility].  2. Hyperlipidemia: Continue Zetia, check lipids next appointment.  Will need to reassess reactions he has had to statins and whether we could re-challenge.  If he will not be able to take a statin and if LDL is significantly elevated despite Zetia, will need to start him on Repatha.  3. Chronic systolic CHF: Ischemic cardiomyopathy: EF 15% on TEE in 7/17.  Hopefully he has had some improvement with CABG.  He was markedly volume overloaded in the hospital but actually appears euvolemic now.  Lasix and losartan on hold with rise in creatinine.  Today, creatinine noted to be back down to 1.12.   - Continue Coreg 3.125 mg bid and spironolactone 25 daily.  - Decrease digoxin to 0.0625 daily and repeat digoxin level in 1 week.  - Can restart lower dose Lasix, 20 mg every other day, with BMET in 1 week.  - Restart losartan 12.5 mg daily.  - He will need an echo in early 11/17 for ICD evaluation.  He would not be a CRT candidate with narrow QRS.  4. AKI: Resolved with holding Lasix and losartan.  As above, restart at lower doses and monitor carefully.   Loralie Champagne 08/13/2016

## 2016-08-12 NOTE — Patient Instructions (Signed)
Routine lab work today. Will notify you of abnormal results, otherwise no news is good news!  Will refer you to Cardiac Rehab at [facility]. They will call you to set up initial appointment.  Follow up 2-3 weeks with Dr. Aundra Dubin.  Do the following things EVERYDAY: 1) Weigh yourself in the morning before breakfast. Write it down and keep it in a log. 2) Take your medicines as prescribed 3) Eat low salt foods-Limit salt (sodium) to 2000 mg per day.  4) Stay as active as you can everyday 5) Limit all fluids for the day to less than 2 liters

## 2016-08-13 ENCOUNTER — Encounter: Payer: Self-pay | Admitting: Cardiology

## 2016-08-13 ENCOUNTER — Telehealth (HOSPITAL_COMMUNITY): Payer: Self-pay | Admitting: *Deleted

## 2016-08-13 DIAGNOSIS — I5032 Chronic diastolic (congestive) heart failure: Secondary | ICD-10-CM

## 2016-08-13 MED ORDER — LOSARTAN POTASSIUM 25 MG PO TABS: 12.5000 mg | ORAL_TABLET | Freq: Every day | ORAL | 3 refills | Status: DC

## 2016-08-13 MED ORDER — FUROSEMIDE 20 MG PO TABS: 20.0000 mg | ORAL_TABLET | ORAL | Status: DC

## 2016-08-13 NOTE — Telephone Encounter (Signed)
Notes Recorded by Scarlette Calico, RN on 08/13/2016 at 3:11 PM EDT Patient aware. Agreeable and verbalizes understanding, repeat labs 8/28  ------

## 2016-08-13 NOTE — Telephone Encounter (Signed)
-----   Message from Larey Dresser, MD sent at 08/12/2016 10:41 PM EDT ----- Digoxin level is high, creatinine is better.  Cut digoxin back to 0.0625 daily (hold for 1 day), repeat digoxin level as trough in 1 week.  He can restart losartan 12.5 mg daily and Lasix 20 mg every other day with BMET in 1 week.

## 2016-08-14 ENCOUNTER — Other Ambulatory Visit: Payer: Self-pay | Admitting: *Deleted

## 2016-08-14 ENCOUNTER — Telehealth (HOSPITAL_COMMUNITY): Payer: Self-pay

## 2016-08-14 DIAGNOSIS — I255 Ischemic cardiomyopathy: Secondary | ICD-10-CM | POA: Diagnosis not present

## 2016-08-14 DIAGNOSIS — N400 Enlarged prostate without lower urinary tract symptoms: Secondary | ICD-10-CM | POA: Diagnosis not present

## 2016-08-14 DIAGNOSIS — I2510 Atherosclerotic heart disease of native coronary artery without angina pectoris: Secondary | ICD-10-CM | POA: Diagnosis not present

## 2016-08-14 DIAGNOSIS — I10 Essential (primary) hypertension: Secondary | ICD-10-CM | POA: Diagnosis not present

## 2016-08-14 NOTE — Patient Outreach (Signed)
[facility]) Care Management  08/14/2016  [name] [name] 08-03-1936 [id number]   Transition of care  RN spoke with [name] [name], patient states that he is making good progress, states his only problem has been with grip but that is improving. Patient states Friday will be his last occupational therapy visit, has he has met the goals.   Patient reports that his weight today is 150.6, appetite okay. Patient reports incisions without redness or drainage.  Patient discussed recent office visit with PCP/Cardiologist  and understands medication changes were made , and his wife is clear on them as she helps with managing his medication, he also has follow up labs in one week patient is aware.   Patient denies any further concerns at this time.   Plan  Will remain active with transition of care, [name] [name] , [facility] will follow up in week.   [name] Draft, RN, [place] Management [phone]- Mobile [phone]- Toll Free Main Office

## 2016-08-14 NOTE — Telephone Encounter (Signed)
Patient's wife called to confirm changes in medication. Reviewed all medication changes as stated per Dr. Aundra Dubin yesterday. No further questions/conerns.  Renee Pain, RN

## 2016-08-16 DIAGNOSIS — N400 Enlarged prostate without lower urinary tract symptoms: Secondary | ICD-10-CM | POA: Diagnosis not present

## 2016-08-16 DIAGNOSIS — I2510 Atherosclerotic heart disease of native coronary artery without angina pectoris: Secondary | ICD-10-CM | POA: Diagnosis not present

## 2016-08-16 DIAGNOSIS — I10 Essential (primary) hypertension: Secondary | ICD-10-CM | POA: Diagnosis not present

## 2016-08-16 DIAGNOSIS — I255 Ischemic cardiomyopathy: Secondary | ICD-10-CM | POA: Diagnosis not present

## 2016-08-22 ENCOUNTER — Other Ambulatory Visit: Payer: Self-pay | Admitting: *Deleted

## 2016-08-22 NOTE — Patient Outreach (Signed)
[facility]) Care Management  08/22/2016  DARY DILAURO 04-23-1936 [id number]  Transition of care call  Spoke with Mr.Craney, he reports he is making good progress, states the grip in his hand is still a issue but " I get a sense there is some progress is being made, patient states the doctor said it would take a little time. Home health occupation and physical therapy services have been completed.  Mr.Rudder reports his weight is staying steady at 150.2, blood pressure 120/60's and heart rate 75, no increase in weights or swelling, states his has a cough that is related to post nasal drip. Patient reports sternal incision is healing well, some soreness that he takes tylenol for occasionally with relief. Patient reports that he is gradually increasing his daily walking times.  Patient denies any new concerns , discussed his upcoming appointment with Dr. Servando Snare on 8/28 and repeat lab work.    Joylene Draft, RN, [place] Management [phone]- Mobile [phone]- Toll Free Main Office

## 2016-08-23 ENCOUNTER — Other Ambulatory Visit: Payer: Self-pay | Admitting: Cardiothoracic Surgery

## 2016-08-23 DIAGNOSIS — Z951 Presence of aortocoronary bypass graft: Secondary | ICD-10-CM

## 2016-08-26 ENCOUNTER — Ambulatory Visit (INDEPENDENT_AMBULATORY_CARE_PROVIDER_SITE_OTHER): Payer: Self-pay | Admitting: Physician Assistant

## 2016-08-26 ENCOUNTER — Ambulatory Visit
Admission: RE | Admit: 2016-08-26 | Discharge: 2016-08-26 | Disposition: A | Discharge status: Home / Self Care | Payer: PPO | Source: Ambulatory Visit | Attending: Cardiothoracic Surgery | Admitting: Cardiothoracic Surgery

## 2016-08-26 ENCOUNTER — Ambulatory Visit (HOSPITAL_COMMUNITY)
Admission: RE | Admit: 2016-08-26 | Discharge: 2016-08-26 | Disposition: A | Discharge status: Home / Self Care | Payer: PPO | Source: Ambulatory Visit | Attending: Cardiology | Admitting: Cardiology

## 2016-08-26 VITALS — BP 91/58 | HR 82 | Temp 97.3°F | Resp 20 | Ht 64.0 in | Wt 155.0 lb

## 2016-08-26 DIAGNOSIS — Z951 Presence of aortocoronary bypass graft: Secondary | ICD-10-CM

## 2016-08-26 DIAGNOSIS — I25110 Atherosclerotic heart disease of native coronary artery with unstable angina pectoris: Secondary | ICD-10-CM

## 2016-08-26 DIAGNOSIS — I5032 Chronic diastolic (congestive) heart failure: Secondary | ICD-10-CM | POA: Diagnosis not present

## 2016-08-26 DIAGNOSIS — R0602 Shortness of breath: Secondary | ICD-10-CM | POA: Diagnosis not present

## 2016-08-26 LAB — BASIC METABOLIC PANEL
Anion gap: 10 (ref 5–15)
BUN: 32 mg/dL — ABNORMAL HIGH (ref 6–20)
CHLORIDE: 98 mmol/L — AB (ref 101–111)
CO2: 25 mmol/L (ref 22–32)
Calcium: 9.1 mg/dL (ref 8.9–10.3)
Creatinine, Ser: 1.71 mg/dL — ABNORMAL HIGH (ref 0.61–1.24)
GFR calc non Af Amer: 37 mL/min — ABNORMAL LOW (ref >60)
GFR, EST AFRICAN AMERICAN: 42 mL/min — AB (ref >60)
Glucose, Bld: 126 mg/dL — ABNORMAL HIGH (ref 65–99)
POTASSIUM: 4.3 mmol/L (ref 3.5–5.1)
SODIUM: 133 mmol/L — AB (ref 135–145)

## 2016-08-26 LAB — DIGOXIN LEVEL: DIGOXIN LVL: 0.3 ng/mL — AB (ref 0.8–2.0)

## 2016-08-26 MED ORDER — SPIRONOLACTONE 25 MG PO TABS: 25.0000 mg | ORAL_TABLET | Freq: Every day | ORAL | 0 refills | Status: DC

## 2016-08-26 NOTE — Progress Notes (Signed)
  HPI:  Patient returns for routine postoperative follow-up having undergone CABG x 4  on 07/19/2016. The patient's early postoperative recovery while in the hospital was notable for Atrial Fibrillation and upper extremity weakness in both hands.  Since hospital discharge the patient reports he is doing well.  He states he has had some mild episodes of dizziness on 2 occasions.  He denies pain.  He is ambulating without difficulty.  He does need some medication refills.    Current Outpatient Prescriptions  Medication Sig Dispense Refill  . acetaminophen (TYLENOL) 325 MG tablet Take 2 tablets (650 mg total) by mouth every 6 (six) hours as needed for mild pain.    Marland Kitchen aspirin 81 MG tablet Take 81 mg by mouth daily.    Marland Kitchen b complex vitamins tablet Take 1 tablet by mouth daily.    . carvedilol (COREG) 3.125 MG tablet Take 1 tablet (3.125 mg total) by mouth 2 (two) times daily. 180 tablet 3  . digoxin (LANOXIN) 0.125 MG tablet Take 1 tablet (0.125 mg total) by mouth daily. 30 tablet 1  . ezetimibe (ZETIA) 10 MG tablet Take 10 mg by mouth daily.    . furosemide (LASIX) 20 MG tablet Take 1 tablet (20 mg total) by mouth every other day. 30 tablet   . losartan (COZAAR) 25 MG tablet Take 0.5 tablets (12.5 mg total) by mouth daily. 90 tablet 3  . Multiple Vitamin (MULTIVITAMIN WITH MINERALS) TABS tablet Take 1 tablet by mouth daily.    . Omega 3 1200 MG CAPS Take 1 capsule by mouth 2 (two) times daily.     Marland Kitchen spironolactone (ALDACTONE) 25 MG tablet Take 1 tablet (25 mg total) by mouth daily. 30 tablet 0   No current facility-administered medications for this visit.     Physical Exam:  BP (!) 91/58 (BP Location: Left Arm, Patient Position: Sitting, Cuff Size: Small)   Pulse 82   Temp 97.3 F (36.3 C) (Oral)   Resp 20   Ht '5\' 4"'$  (1.626 m)   Wt 155 lb (70.3 kg)   SpO2 98% Comment: RA  BMI 26.61 kg/m   Gen: no apparent distress Heart: RRR Lungs: CTA bilaterally Ext: incisions healing well, no  evidence of infection Sternum: stable  Diagnostic Tests:  CXR: sternal wires intact, plating to right clavicle, minimal pleural fluid present  A/P  1. S/p CABG x 4 doing well 2. Atrial Fibrillation, post operatively- maintaining NSR, will d/c Amiodarone 3. Chronic Systolic CHF- monitored by Dr. Aundra Dubin gave refill on Spironolactone, hypotensive today may benefit from further reduction in anti-hypertensive but will defer to Cardiology 4. Dispo- RTC in 6-8 weeks per patient request as they would like to see Dr. Damita Dunnings, PA-C [facility] [phone]

## 2016-08-27 ENCOUNTER — Telehealth (HOSPITAL_COMMUNITY): Payer: Self-pay | Admitting: *Deleted

## 2016-08-27 DIAGNOSIS — R300 Dysuria: Secondary | ICD-10-CM | POA: Diagnosis not present

## 2016-08-27 MED ORDER — FUROSEMIDE 20 MG PO TABS: ORAL_TABLET | ORAL | Status: DC

## 2016-08-27 MED ORDER — SPIRONOLACTONE 25 MG PO TABS: 25.0000 mg | ORAL_TABLET | Freq: Every day | ORAL | 3 refills | Status: DC

## 2016-08-27 NOTE — Telephone Encounter (Signed)
Notes Recorded by Harvie Junior, [place] on 08/27/2016 at 5:18 PM EDT Patient aware. Medication updated in patients chart. ------  Notes Recorded by Larey Dresser, MD on 08/26/2016 at 11:10 PM EDT Creatinine elevated. Stop Lasix, would have him take Lasix 20 mg daily x 2 days if he gains 2 lbs overnight or 4 lbs in a week.    Ref Range & Units 1d ago 2wk ago 82moago   Sodium 135 - 145 mmol/L 133  136 136   Potassium 3.5 - 5.1 mmol/L 4.3 4.8 4.1   Chloride 101 - 111 mmol/L 98  103 95    CO2 22 - 32 mmol/L 25 21  32   Glucose, Bld 65 - 99 mg/dL 126  157  116    BUN 6 - 20 mg/dL 32  27  20   Creatinine, Ser 0.61 - 1.24 mg/dL 1.71  1.12 1.14   Calcium 8.9 - 10.3 mg/dL 9.1 9.6 8.8    GFR calc non Af Amer >60 mL/min 37  >60 60    GFR calc Af Amer >60 mL/min 42  >60CM >60CM

## 2016-08-29 ENCOUNTER — Encounter: Payer: PPO | Admitting: Cardiothoracic Surgery

## 2016-08-30 ENCOUNTER — Other Ambulatory Visit: Payer: Self-pay

## 2016-08-30 NOTE — Patient Outreach (Signed)
Final transition of care call: Discharge date of 07/29/2016. Today is day 31 post discharge.  Patient reports that he is doing well. Reports that he has completed PT and OT. States that he continues to do his home exercises.  Patient reports that he has been " released" from his doctor with lifting restrictions of 30 pounds.  Patient reports that he has started to drive again. Patient reports that he has 90% use of his left hand and 30 % use of his right hand.  Reports that he will start cardiac rehab in about 2 weeks when he returns from vacation in [place].   Patient admits that he had fever and cough for about 2 days and saw his primary MD and was diagnosed with at UTI which is being treated with Cipro.  Patient unsure of dose as he is in the basement of his house with his wife.  States that he is starting to feel better.   CHF: reports weight today of 151 pounds. States that he was instructed to use lasix as needed for a 2 pound weight gain. Patient feels comfortable calling MD for any questions or concerns.  Reviewed with patient that he has successfully completed transition of care program.  At this time patient denies any other needs. Reviewed case closure with patient and he is in agreement.  Requested patient call [facility] if he needs assistance in the future. He agreed.  PLAN: goals met. Will close case. Will notify MD. Will send patient case closure letter.  [facility] CM Care Plan Problem One   Flowsheet Row Most Recent Value  Care Plan Problem One  Recent admission for CABG.  Role Documenting the Problem One  Care Management [place] for Problem One  Active  [facility]N Long Term Goal (31-90 days)  Patient will report no readmissions in the next 31 days.  THN Long Term Goal Start Date  07/31/16  THN Long Term Goal Met Date  08/30/16  Interventions for Problem One Long Term Goal  Discussed to continue to monitor weights, daily ,reinforced to continue to increase walking time daily  .   THN CM Short Term Goal #1 (0-30 days)  Paitent will report being contacted by occupational therapist within the 7 days  THN CM Short Term Goal #1 Start Date  07/31/16  [facility] CM Short Term Goal #1 Met Date  08/07/16  THN CM Short Term Goal #2 (0-30 days)  Patient will report increase in strength in hands within the next 30 days.  THN CM Short Term Goal #2 Start Date  07/31/16  [facility] CM Short Term Goal #2 Met Date  08/30/16  Interventions for Short Term Goal #2  Encouraged patient benefits of continued exercises as taught by therapy       Tomasa Rand, RN, BSN, CEN [place] Coordinator [phone]

## 2016-09-06 ENCOUNTER — Ambulatory Visit (HOSPITAL_COMMUNITY)
Admission: RE | Admit: 2016-09-06 | Discharge: 2016-09-06 | Disposition: A | Discharge status: Home / Self Care | Payer: PPO | Source: Ambulatory Visit | Attending: Cardiology | Admitting: Cardiology

## 2016-09-06 VITALS — BP 136/66 | HR 84 | Wt 157.5 lb

## 2016-09-06 DIAGNOSIS — Z85118 Personal history of other malignant neoplasm of bronchus and lung: Secondary | ICD-10-CM | POA: Diagnosis not present

## 2016-09-06 DIAGNOSIS — Z79899 Other long term (current) drug therapy: Secondary | ICD-10-CM | POA: Diagnosis not present

## 2016-09-06 DIAGNOSIS — I255 Ischemic cardiomyopathy: Secondary | ICD-10-CM | POA: Insufficient documentation

## 2016-09-06 DIAGNOSIS — I5022 Chronic systolic (congestive) heart failure: Secondary | ICD-10-CM | POA: Insufficient documentation

## 2016-09-06 DIAGNOSIS — N189 Chronic kidney disease, unspecified: Secondary | ICD-10-CM | POA: Insufficient documentation

## 2016-09-06 DIAGNOSIS — Z87891 Personal history of nicotine dependence: Secondary | ICD-10-CM | POA: Insufficient documentation

## 2016-09-06 DIAGNOSIS — Z7982 Long term (current) use of aspirin: Secondary | ICD-10-CM | POA: Diagnosis not present

## 2016-09-06 DIAGNOSIS — Z9889 Other specified postprocedural states: Secondary | ICD-10-CM | POA: Diagnosis not present

## 2016-09-06 DIAGNOSIS — Z85828 Personal history of other malignant neoplasm of skin: Secondary | ICD-10-CM | POA: Diagnosis not present

## 2016-09-06 DIAGNOSIS — N400 Enlarged prostate without lower urinary tract symptoms: Secondary | ICD-10-CM | POA: Diagnosis not present

## 2016-09-06 DIAGNOSIS — I4891 Unspecified atrial fibrillation: Secondary | ICD-10-CM | POA: Insufficient documentation

## 2016-09-06 DIAGNOSIS — I2510 Atherosclerotic heart disease of native coronary artery without angina pectoris: Secondary | ICD-10-CM | POA: Insufficient documentation

## 2016-09-06 DIAGNOSIS — Z951 Presence of aortocoronary bypass graft: Secondary | ICD-10-CM | POA: Diagnosis not present

## 2016-09-06 DIAGNOSIS — I420 Dilated cardiomyopathy: Secondary | ICD-10-CM

## 2016-09-06 DIAGNOSIS — E785 Hyperlipidemia, unspecified: Secondary | ICD-10-CM | POA: Diagnosis not present

## 2016-09-06 LAB — LIPID PANEL
CHOLESTEROL: 174 mg/dL (ref 0–200)
HDL: 46 mg/dL (ref >40)
LDL Cholesterol: 110 mg/dL — ABNORMAL HIGH (ref 0–99)
TRIGLYCERIDES: 91 mg/dL (ref <150)
Total CHOL/HDL Ratio: 3.8 RATIO
VLDL: 18 mg/dL (ref 0–40)

## 2016-09-06 LAB — BASIC METABOLIC PANEL
ANION GAP: 6 (ref 5–15)
BUN: 20 mg/dL (ref 6–20)
CALCIUM: 9.4 mg/dL (ref 8.9–10.3)
CO2: 26 mmol/L (ref 22–32)
Chloride: 107 mmol/L (ref 101–111)
Creatinine, Ser: 1.03 mg/dL (ref 0.61–1.24)
Glucose, Bld: 130 mg/dL — ABNORMAL HIGH (ref 65–99)
POTASSIUM: 4.7 mmol/L (ref 3.5–5.1)
Sodium: 139 mmol/L (ref 135–145)

## 2016-09-06 LAB — DIGOXIN LEVEL

## 2016-09-06 LAB — BRAIN NATRIURETIC PEPTIDE: B NATRIURETIC PEPTIDE 5: 141.5 pg/mL — AB (ref 0.0–100.0)

## 2016-09-06 MED ORDER — CARVEDILOL 6.25 MG PO TABS: 6.2500 mg | ORAL_TABLET | Freq: Two times a day (BID) | ORAL | 3 refills | Status: DC

## 2016-09-06 NOTE — Patient Instructions (Signed)
Increase Carvedilol to 6.25 mg Twice daily   Labs today  Your physician recommends that you schedule a follow-up appointment in: 1 month

## 2016-09-06 NOTE — Progress Notes (Signed)
Advanced Heart Failure Medication Review by a Pharmacist  Does the patient  feel that his/her medications are working for him/her?  yes  Has the patient been experiencing any side effects to the medications prescribed?  no  Does the patient measure his/her own blood pressure or blood glucose at home?  yes   Does the patient have any problems obtaining medications due to transportation or finances?   no  Understanding of regimen: good Understanding of indications: good Potential of compliance: good Patient understands to avoid NSAIDs. Patient understands to avoid decongestants.  Issues to address at subsequent visits: None   Pharmacist comments:  [name] [name] is a pleasant 80 yo M presenting with his wife and a current medication list. He reports good compliance with his regimen and did not have any specific medication-related questions or concerns for me at this time.   Ruta Hinds. Velva Harman, PharmD, BCPS, CPP Clinical Pharmacist Pager: [phone] Phone: [phone] 09/06/2016 9:49 AM      Time with patient: 10 minutes Preparation and documentation time: 2 minutes Total time: 12 minutes

## 2016-09-07 NOTE — Progress Notes (Signed)
PCP: Dr Lavone Orn HF Cardiology: Dr. Aundra Dubin Cardiac surgery: Dr. Servando Snare  80 yo with history of CAD s/p CABG, ischemic cardiomyopathy, and lung cancer s/p resection in 2013 presents for cardiology followup after recent hospitalization for CABG.  He was followed in the past by Dr Radford Pax.  EF was noted to be 35% in 9/16 by cMRI.  He had a high risk stress test in 6/17 that was followed by cath showing 3 VD.  He had CABG x 4 on 07/19/16.  TEE at the time of CABG showed EF 15%.    Post-op, he was initially hypotensive but this resolved.  He was markedly volume overloaded and was diuresed extensively.  He went home from the hospital on Lasix 60 mg po bid and weight continued to drop.  Lasix was stopped with rise in creatinine. Weight has stayed stable.  He is feeling good overall. No significant dyspnea.  He is walking up and down his driveway for exercise, back to driving.  No lightheadedness or syncope.  He still has some numbness in the first and 2nd fingers of his right hand, this has been present since the surgery.  No orthopnea/PND. He has had a chronic cough since surgery.  He is now off amiodarone, in NSR.    Labs (07/27/16): K 4.1, creatinine 1.14 Labs (08/08/16): K 5.4, creatinine 1.72 Labs (08/12/16): K 4.8 => 4.3, creatinine 1.12 => 1.71, digoxin 1.6 => 0.3  PMH: 1. CAD: CABG (07/19/16) with LIMA-LAD, SVG-D, SVG-OM, SVG-PDA following high risk stress Cardiolite.  2. Chronic systolic CHF: Ischemic cardiomyopathy.  - Cardiac MRI (9/16) with EF 35%, normal RV.  - Cardiolite (6/17) with EF 21%.  - TEE (7/17, in OR) with EF 15%.  3. H/o non-small cell lung CA, s/p resection in 2013. 4. BPH 5. PVCs 6. Atrial fibrillation: Noted only post-op in 7/17.  7. Skin cancers 8. Hyperlipidemia: Muscle pain and confusion with statin use.   Social History   Social History  . Marital status: Married    Spouse name: N/A  . Number of children: N/A  . Years of education: N/A   Occupational History   . Not on file.   Social History Main Topics  . Smoking status: Former Smoker    Types: Cigarettes    Quit date: 12/30/1965  . Smokeless tobacco: Never Used  . Alcohol use 3.0 - 3.6 oz/week    3 - 4 Glasses of wine, 2 Cans of beer per week     Comment: infrequently- "indulge heavly when I do"  . Drug use: No  . Sexual activity: Not on file   Other Topics Concern  . Not on file   Social History Narrative  . No narrative on file   Family History  Problem Relation Age of Onset  . Diabetes Father   . Psoriasis Child   . Heart disease Child     resuscitated from cardiac arrest from Brugada's syndrome  . Lung cancer Mother   . Heart disease Mother    ROS: All systems reviewed and negative except as per HPI.   Current Outpatient Prescriptions  Medication Sig Dispense Refill  . Ascorbic Acid (VITAMIN C) 1000 MG tablet Take 1,000 mg by mouth daily.    Marland Kitchen aspirin 81 MG tablet Take 81 mg by mouth daily.    Marland Kitchen b complex vitamins tablet Take 1 tablet by mouth daily.    . carvedilol (COREG) 6.25 MG tablet Take 1 tablet (6.25 mg total) by mouth 2 (two)  times daily. 60 tablet 3  . digoxin (LANOXIN) 0.125 MG tablet Take 0.0625 mg by mouth daily.    Marland Kitchen ezetimibe (ZETIA) 10 MG tablet Take 10 mg by mouth daily.    Marland Kitchen losartan (COZAAR) 25 MG tablet Take 0.5 tablets (12.5 mg total) by mouth daily. 90 tablet 3  . Multiple Vitamin (MULTIVITAMIN WITH MINERALS) TABS tablet Take 1 tablet by mouth daily.    . Omega 3 1200 MG CAPS Take 1 capsule by mouth 2 (two) times daily.     Marland Kitchen pyridoxine (B-6) 100 MG tablet Take 100 mg by mouth daily.    Marland Kitchen spironolactone (ALDACTONE) 25 MG tablet Take 1 tablet (25 mg total) by mouth daily. 30 tablet 3  . acetaminophen (TYLENOL) 325 MG tablet Take 2 tablets (650 mg total) by mouth every 6 (six) hours as needed for mild pain. (Patient not taking: Reported on 09/06/2016)     No current facility-administered medications for this encounter.    BP 136/66   Pulse 84   Wt  157 lb 8 oz (71.4 kg)   SpO2 98%   BMI 27.03 kg/m  General: NAD Neck: No JVD, no thyromegaly or thyroid nodule.  Lungs: Clear to auscultation bilaterally with normal respiratory effort. CV: Nondisplaced PMI.  Heart regular S1/S2, no S3/S4, no murmur.  No peripheral edema.  No carotid bruit.  Normal pedal pulses.  Abdomen: Soft, nontender, no hepatosplenomegaly, no distention.  Skin: Intact without lesions or rashes.  Neurologic: Alert and oriented x 3.  Psych: Normal affect. Extremities: No clubbing or cyanosis.  HEENT: Normal.   Assessment/Plan: 1. CAD: s/p CABG.  No ischemic chest pain.  - Continue ASA 81 and Zetia (has not been able to tolerate statins). - To start cardiac rehab at [facility].   2. Hyperlipidemia: Continue Zetia, check lipids today.  If LDL remains high, will try again to get him Repatha.  3. Chronic systolic CHF: Ischemic cardiomyopathy: EF 15% on TEE in 7/17.  Hopefully he has had some improvement with CABG.  He was markedly volume overloaded in the hospital but appears euvolemic now.   - Increase Coreg to 6.25 mg bid.   - Continue current digoxin, check level.   - Lasix only prn.  - Continue losartan 12.5 mg daily and spironolactone 25 daily.  - He will need an echo in early 11/17 for ICD evaluation.  He would not be a CRT candidate with narrow QRS.  4. CKD: Check creatinine today, suspect lower off Lasix.  5. Atrial fibrillation: Noted only post-op CABG.  Now off amiodarone.  If he has a recurrence, will need anticoagulation.    Loralie Champagne 09/07/2016

## 2016-09-09 ENCOUNTER — Telehealth (HOSPITAL_COMMUNITY): Payer: Self-pay | Admitting: *Deleted

## 2016-09-09 DIAGNOSIS — E7800 Pure hypercholesterolemia, unspecified: Secondary | ICD-10-CM

## 2016-09-09 NOTE — Telephone Encounter (Signed)
Referral placed with lipid clinic.

## 2016-09-19 ENCOUNTER — Encounter: Payer: PPO | Attending: Cardiology | Admitting: *Deleted

## 2016-09-19 VITALS — BP 110/60 | HR 64 | Ht 63.5 in | Wt 155.7 lb

## 2016-09-19 DIAGNOSIS — Z951 Presence of aortocoronary bypass graft: Secondary | ICD-10-CM | POA: Diagnosis not present

## 2016-09-19 NOTE — Progress Notes (Signed)
Cardiac Individual Treatment Plan  Patient Details  Name: [name] [name] MRN: [id number] Date of Birth: July 21, 1936 Referring Provider:   Flowsheet Row Cardiac Rehab from 09/19/2016 in [facility] Cardiac and Pulmonary Rehab  Referring Provider  Loralie Champagne MD      Initial Encounter Date:  Flowsheet Row Cardiac Rehab from 09/19/2016 in [facility] Cardiac and Pulmonary Rehab  Date  09/19/16  Referring Provider  Loralie Champagne MD      Visit Diagnosis: S/P CABG x 4  Patient's Home Medications on Admission:  Current Outpatient Prescriptions:  .  acetaminophen (TYLENOL) 325 MG tablet, Take 2 tablets (650 mg total) by mouth every 6 (six) hours as needed for mild pain., Disp: , Rfl:  .  Ascorbic Acid (VITAMIN C) 1000 MG tablet, Take 1,000 mg by mouth daily., Disp: , Rfl:  .  aspirin 81 MG tablet, Take 81 mg by mouth daily., Disp: , Rfl:  .  b complex vitamins tablet, Take 1 tablet by mouth daily., Disp: , Rfl:  .  carvedilol (COREG) 6.25 MG tablet, Take 1 tablet (6.25 mg total) by mouth 2 (two) times daily., Disp: 60 tablet, Rfl: 3 .  digoxin (LANOXIN) 0.125 MG tablet, Take 0.0625 mg by mouth daily., Disp: , Rfl:  .  ezetimibe (ZETIA) 10 MG tablet, Take 10 mg by mouth daily., Disp: , Rfl:  .  losartan (COZAAR) 25 MG tablet, Take 0.5 tablets (12.5 mg total) by mouth daily., Disp: 90 tablet, Rfl: 3 .  Multiple Vitamin (MULTIVITAMIN WITH MINERALS) TABS tablet, Take 1 tablet by mouth daily., Disp: , Rfl:  .  Omega 3 1200 MG CAPS, Take 1 capsule by mouth 2 (two) times daily. , Disp: , Rfl:  .  pyridoxine (B-6) 100 MG tablet, Take 100 mg by mouth daily., Disp: , Rfl:  .  spironolactone (ALDACTONE) 25 MG tablet, Take 1 tablet (25 mg total) by mouth daily., Disp: 30 tablet, Rfl: 3  Past Medical History: Past Medical History:  Diagnosis Date  . Arthritis    JOINT PAIN RIGHT HAND  . BPH (benign prostatic hyperplasia)    [name]/elevated PSA, prostate biopsy x4 including one saturation biopsy, laser  treatment 2/14; NOCTURIA  . CAD (coronary artery disease) 2013   cath 05/2012 showing 80-90% stenosis of a trifurcating diagonal #1, 70-80% stenosis of OM3 and 90% stenosis of distal LCx after OM3 - medical management, [place]  . Cardiomyopathy ([place])    dilated cardiomyopathy EF 30%, MUGA  EF 42% 08/2012  . Carotid artery occlusion    carotid artery bruit  . Chronic kidney disease    kidney stones -small passed.  . Diastolic dysfunction   . Heart murmur    as a child  . History of shingles   . Hyperlipidemia    statin intolerant  . Hypertension   . Lung mass    Stage 1B non-small cell lung CA s/p resection 2013  . PVC (premature ventricular contraction)   . Shingles   . Sigmoid diverticulosis   . Skin cancer    Multiple skin cancers   . Squamous cell carcinoma, face    history of right face with mets to right upper cheek in 2004  and facial lymph node reoccurence post surgery with XRT  . Trigger finger    Bilateral    Tobacco Use: History  Smoking Status  . Former Smoker  . Types: Cigarettes  . Quit date: 12/30/1965  Smokeless Tobacco  . Never Used    Labs: Recent Review Flowsheet Data  Labs for ITP Cardiac and Pulmonary Rehab Latest Ref Rng & Units 07/19/2016 07/19/2016 07/20/2016 07/22/2016 09/06/2016   Cholestrol 0 - 200 mg/dL - - - - 174   LDLCALC 0 - 99 mg/dL - - - - 110(H)   LDLDIRECT mg/dL - - - - -   HDL >40 mg/dL - - - - 46   Trlycerides <150 mg/dL - - - - 91   Hemoglobin A1c 4.8 - 5.6 % - - - - -   PHART 7.350 - 7.450 - 7.318(L) - - -   PCO2ART 35.0 - 45.0 mmHg - 45.0 - - -   HCO3 20.0 - 24.0 mEq/L - 23.0 - - -   TCO2 0 - 100 mmol/L '23 24 20 '$ - -   ACIDBASEDEF 0.0 - 2.0 mmol/L - 3.0(H) - - -   O2SAT % - 98.0 - 59.3 -       Exercise Target Goals: Date: 09/19/16  Exercise Program Goal: Individual exercise prescription set with THRR, safety & activity barriers. Participant demonstrates ability to understand and report RPE using BORG scale, to self-measure  pulse accurately, and to acknowledge the importance of the exercise prescription.  Exercise Prescription Goal: Starting with aerobic activity 30 plus minutes a day, 3 days per week for initial exercise prescription. Provide home exercise prescription and guidelines that participant acknowledges understanding prior to discharge.  Activity Barriers & Risk Stratification:     Activity Barriers & Cardiac Risk Stratification - 09/19/16 1455      Activity Barriers & Cardiac Risk Stratification   Activity Barriers Deconditioning;Muscular Weakness;Balance Concerns;Shortness of Breath  unable to lift over head from myalgias, occasional dizziness   Cardiac Risk Stratification High      6 Minute Walk:     6 Minute Walk    Row Name 09/19/16 1502         6 Minute Walk   Phase Initial     Distance 1290 feet     Walk Time 6 minutes     # of Rest Breaks 0     MPH 2.44     METS 2.46     RPE 7     Perceived Dyspnea  2     VO2 Peak 8.61     Symptoms Yes (comment)     Comments slightly short of breath     Resting HR 64 bpm     Resting BP 110/60     Max Ex. HR 113 bpm     Max Ex. BP 124/70     2 Minute Post BP 116/64        Initial Exercise Prescription:     Initial Exercise Prescription - 09/19/16 1500      Date of Initial Exercise RX and Referring Provider   Date 09/19/16   Referring Provider Loralie Champagne MD     Treadmill   MPH 2.5   Grade 0.5   Minutes 15   METs 3.09     NuStep   Level 3   Watts --  80-100 spm   Minutes 15   METs 2     REL-XR   Level 2   Watts --  speed >50   Minutes 15   METs 2     Prescription Details   Frequency (times per week) 2   Duration Progress to 45 minutes of aerobic exercise without signs/symptoms of physical distress     Intensity   THRR 40-80% of Max Heartrate 95-126   Ratings of Perceived  Exertion 11-15   Perceived Dyspnea 0-4     Progression   Progression Continue to progress workloads to maintain intensity without  signs/symptoms of physical distress.     Resistance Training   Training Prescription Yes   Weight 3 lbs   Reps 10-12      Perform Capillary Blood Glucose checks as needed.  Exercise Prescription Changes:     Exercise Prescription Changes    Row Name 09/19/16 1500             Exercise Review   Progression -  walk test results         Response to Exercise   Blood Pressure (Admit) 110/60       Blood Pressure (Exercise) 124/70       Blood Pressure (Exit) 116/64       Heart Rate (Admit) 64 bpm       Heart Rate (Exercise) 113 bpm       Heart Rate (Exit) 73 bpm       Oxygen Saturation (Admit) 97 %       Oxygen Saturation (Exit) 98 %       Rating of Perceived Exertion (Exercise) 7       Perceived Dyspnea (Exercise) 2       Symptoms slight SOB          Exercise Comments:     Exercise Comments    Row Name 09/19/16 1506           Exercise Comments Main goal is to increase strength and stamina to regain lifestyle and not fatigue so easily.          Discharge Exercise Prescription (Final Exercise Prescription Changes):     Exercise Prescription Changes - 09/19/16 1500      Exercise Review   Progression --  walk test results     Response to Exercise   Blood Pressure (Admit) 110/60   Blood Pressure (Exercise) 124/70   Blood Pressure (Exit) 116/64   Heart Rate (Admit) 64 bpm   Heart Rate (Exercise) 113 bpm   Heart Rate (Exit) 73 bpm   Oxygen Saturation (Admit) 97 %   Oxygen Saturation (Exit) 98 %   Rating of Perceived Exertion (Exercise) 7   Perceived Dyspnea (Exercise) 2   Symptoms slight SOB      Nutrition:  Target Goals: Understanding of nutrition guidelines, daily intake of sodium '1500mg'$ , cholesterol '200mg'$ , calories 30% from fat and 7% or less from saturated fats, daily to have 5 or more servings of fruits and vegetables.  Biometrics:     Pre Biometrics - 09/19/16 1507      Pre Biometrics   Height 5' 3.5" (1.613 m)   Weight 155 lb 11.2 oz  (70.6 kg)   Waist Circumference 37 inches   Hip Circumference 37 inches   Waist to Hip Ratio 1 %   BMI (Calculated) 27.2   Single Leg Stand 30 seconds       Nutrition Therapy Plan and Nutrition Goals:     Nutrition Therapy & Goals - 09/19/16 1501      Nutrition Therapy   Drug/Food Interactions Statins/Certain Fruits     Intervention Plan   Intervention Prescribe, educate and counsel regarding individualized specific dietary modifications aiming towards targeted core components such as weight, hypertension, lipid management, diabetes, heart failure and other comorbidities.;Nutrition handout(s) given to patient.   Expected Outcomes Short Term Goal: Understand basic principles of dietary content, such as calories, fat, sodium, cholesterol and nutrients.;Short  Term Goal: A plan has been developed with personal nutrition goals set during dietitian appointment.;Long Term Goal: Adherence to prescribed nutrition plan.      Nutrition Discharge: Rate Your Plate Scores:     Nutrition Assessments - 09/19/16 1504      Rate Your Plate Scores   Pre Score 66   Pre Score % 73.3 %      Nutrition Goals Re-Evaluation:   Psychosocial: Target Goals: Acknowledge presence or absence of depression, maximize coping skills, provide positive support system. Participant is able to verbalize types and ability to use techniques and skills needed for reducing stress and depression.  Initial Review & Psychosocial Screening:     Initial Psych Review & Screening - 09/19/16 [place]? Yes     Barriers   Psychosocial barriers to participate in program There are no identifiable barriers or psychosocial needs.     Screening Interventions   Interventions Encouraged to exercise      Quality of Life Scores:     Quality of Life - 09/19/16 1453      Quality of Life Scores   Health/Function Pre 24.17 %   Socioeconomic Pre 29.64 %   Psych/Spiritual Pre 28.29 %    Family Pre 25.2 %   GLOBAL Pre 26.29 %      PHQ-9: Recent Review Flowsheet Data    Depression screen [facility] 2/9 09/19/2016 08/07/2016   Decreased Interest 0 0   Down, Depressed, Hopeless 0 0   PHQ - 2 Score 0 0   Altered sleeping 1 -   Tired, decreased energy 1 -   Change in appetite 0 -   Feeling bad or failure about yourself  0 -   Trouble concentrating 0 -   Moving slowly or fidgety/restless 1 -   Suicidal thoughts 0 -   PHQ-9 Score 3 -   Difficult doing work/chores Not difficult at all -      Psychosocial Evaluation and Intervention:   Psychosocial Re-Evaluation:   Vocational Rehabilitation: Provide vocational rehab assistance to qualifying candidates.   Vocational Rehab Evaluation & Intervention:     Vocational Rehab - 09/19/16 1456      Initial Vocational Rehab Evaluation & Intervention   Assessment shows need for Vocational Rehabilitation No      Education: Education Goals: Education classes will be provided on a weekly basis, covering required topics. Participant will state understanding/return demonstration of topics presented.  Learning Barriers/Preferences:     Learning Barriers/Preferences - 09/19/16 1455      Learning Barriers/Preferences   Learning Barriers None   Learning Preferences Individual Instruction;Verbal Instruction      Education Topics: General Nutrition Guidelines/Fats and Fiber: -Group instruction provided by verbal, written material, models and posters to present the general guidelines for heart healthy nutrition. Gives an explanation and review of dietary fats and fiber.   Controlling Sodium/Reading Food Labels: -Group verbal and written material supporting the discussion of sodium use in heart healthy nutrition. Review and explanation with models, verbal and written materials for utilization of the food label.   Exercise Physiology & Risk Factors: - Group verbal and written instruction with models to review the exercise  physiology of the cardiovascular system and associated critical values. Details cardiovascular disease risk factors and the goals associated with each risk factor.   Aerobic Exercise & Resistance Training: - Gives group verbal and written discussion on the health impact of inactivity. On the components of  aerobic and resistive training programs and the benefits of this training and how to safely progress through these programs.   Flexibility, Balance, General Exercise Guidelines: - Provides group verbal and written instruction on the benefits of flexibility and balance training programs. Provides general exercise guidelines with specific guidelines to those with heart or lung disease. Demonstration and skill practice provided.   Stress Management: - Provides group verbal and written instruction about the health risks of elevated stress, cause of high stress, and healthy ways to reduce stress.   Depression: - Provides group verbal and written instruction on the correlation between heart/lung disease and depressed mood, treatment options, and the stigmas associated with seeking treatment.   Anatomy & Physiology of the Heart: - Group verbal and written instruction and models provide basic cardiac anatomy and physiology, with the coronary electrical and arterial systems. Review of: AMI, Angina, Valve disease, Heart Failure, Cardiac Arrhythmia, Pacemakers, and the ICD.   Cardiac Procedures: - Group verbal and written instruction and models to describe the testing methods done to diagnose heart disease. Reviews the outcomes of the test results. Describes the treatment choices: Medical Management, Angioplasty, or Coronary Bypass Surgery.   Cardiac Medications: - Group verbal and written instruction to review commonly prescribed medications for heart disease. Reviews the medication, class of the drug, and side effects. Includes the steps to properly store meds and maintain the prescription  regimen.   Go Sex-Intimacy & Heart Disease, Get SMART - Goal Setting: - Group verbal and written instruction through game format to discuss heart disease and the return to sexual intimacy. Provides group verbal and written material to discuss and apply goal setting through the application of the S.M.A.R.T. Method.   Other Matters of the Heart: - Provides group verbal, written materials and models to describe Heart Failure, Angina, Valve Disease, and Diabetes in the realm of heart disease. Includes description of the disease process and treatment options available to the cardiac patient.   Exercise & Equipment Safety: - Individual verbal instruction and demonstration of equipment use and safety with use of the equipment. Flowsheet Row Cardiac Rehab from 09/19/2016 in [facility] Cardiac and Pulmonary Rehab  Date  09/19/16  Educator  C. EnterkinRN  Instruction Review Code  1- partially meets, needs review/practice      Infection Prevention: - Provides verbal and written material to individual with discussion of infection control including proper hand washing and proper equipment cleaning during exercise session. Flowsheet Row Cardiac Rehab from 09/19/2016 in [facility] Cardiac and Pulmonary Rehab  Date  09/19/16  Educator  C. EnterkinRN  Instruction Review Code  2- meets goals/outcomes      Falls Prevention: - Provides verbal and written material to individual with discussion of falls prevention and safety. Flowsheet Row Cardiac Rehab from 09/19/2016 in [facility] Cardiac and Pulmonary Rehab  Date  09/19/16  Educator  C. [place]  Instruction Review Code  2- meets goals/outcomes      Diabetes: - Individual verbal and written instruction to review signs/symptoms of diabetes, desired ranges of glucose level fasting, after meals and with exercise. Advice that pre and post exercise glucose checks will be done for 3 sessions at entry of program.    Knowledge Questionnaire Score:     Knowledge  Questionnaire Score - 09/19/16 1455      Knowledge Questionnaire Score   Pre Score 21/28      Core Components/Risk Factors/Patient Goals at Admission:     Personal Goals and Risk Factors at Admission - 09/19/16 1505  Core Components/Risk Factors/Patient Goals on Admission   Hypertension Yes   Intervention Provide education on lifestyle modifcations including regular physical activity/exercise, weight management, moderate sodium restriction and increased consumption of fresh fruit, vegetables, and low fat dairy, alcohol moderation, and smoking cessation.;Monitor prescription use compliance.   Expected Outcomes Short Term: Continued assessment and intervention until BP is < 140/36m HG in hypertensive participants. < 130/863mHG in hypertensive participants with diabetes, heart failure or chronic kidney disease.;Long Term: Maintenance of blood pressure at goal levels.   Lipids Yes   Intervention Provide education and support for participant on nutrition & aerobic/resistive exercise along with prescribed medications to achieve LDL '70mg'$ , HDL >'40mg'$ .   Expected Outcomes Short Term: Participant states understanding of desired cholesterol values and is compliant with medications prescribed. Participant is following exercise prescription and nutrition guidelines.;Long Term: Cholesterol controlled with medications as prescribed, with individualized exercise RX and with personalized nutrition plan. Value goals: LDL < '70mg'$ , HDL > 40 mg.      Core Components/Risk Factors/Patient Goals Review:    Core Components/Risk Factors/Patient Goals at Discharge (Final Review):    ITP Comments:   Comments: Ready to start Cardiac Rehab.

## 2016-09-19 NOTE — Patient Instructions (Signed)
Patient Instructions  Patient Details  Name: [name] [name] MRN: [id number] Date of Birth: 12/27/1936 Referring Provider:  Larey Dresser, MD  Below are the personal goals you chose as well as exercise and nutrition goals. Our goal is to help you keep on track towards obtaining and maintaining your goals. We will be discussing your progress on these goals with you throughout the program.  Initial Exercise Prescription:     Initial Exercise Prescription - 09/19/16 1500      Date of Initial Exercise RX and Referring Provider   Date 09/19/16   Referring Provider Loralie Champagne MD     Treadmill   MPH 2.5   Grade 0.5   Minutes 15   METs 3.09     NuStep   Level 3   Watts --  80-100 spm   Minutes 15   METs 2     REL-XR   Level 2   Watts --  speed >50   Minutes 15   METs 2     Prescription Details   Frequency (times per week) 2   Duration Progress to 45 minutes of aerobic exercise without signs/symptoms of physical distress     Intensity   THRR 40-80% of Max Heartrate 95-126   Ratings of Perceived Exertion 11-15   Perceived Dyspnea 0-4     Progression   Progression Continue to progress workloads to maintain intensity without signs/symptoms of physical distress.     Resistance Training   Training Prescription Yes   Weight 3 lbs   Reps 10-12      Exercise Goals: Frequency: Be able to perform aerobic exercise three times per week working toward 3-5 days per week.  Intensity: Work with a perceived exertion of 11 (fairly light) - 15 (hard) as tolerated. Follow your new exercise prescription and watch for changes in prescription as you progress with the program. Changes will be reviewed with you when they are made.  Duration: You should be able to do 30 minutes of continuous aerobic exercise in addition to a 5 minute warm-up and a 5 minute cool-down routine.  Nutrition Goals: Your personal nutrition goals will be established when you do your nutrition analysis  with the dietician.  The following are nutrition guidelines to follow: Cholesterol < '200mg'$ /day Sodium < '1500mg'$ /day Fiber: Men over 50 yrs - 30 grams per day  Personal Goals:     Personal Goals and Risk Factors at Admission - 09/19/16 1505      Core Components/Risk Factors/Patient Goals on Admission   Hypertension Yes   Intervention Provide education on lifestyle modifcations including regular physical activity/exercise, weight management, moderate sodium restriction and increased consumption of fresh fruit, vegetables, and low fat dairy, alcohol moderation, and smoking cessation.;Monitor prescription use compliance.   Expected Outcomes Short Term: Continued assessment and intervention until BP is < 140/37m HG in hypertensive participants. < 130/818mHG in hypertensive participants with diabetes, heart failure or chronic kidney disease.;Long Term: Maintenance of blood pressure at goal levels.   Lipids Yes   Intervention Provide education and support for participant on nutrition & aerobic/resistive exercise along with prescribed medications to achieve LDL '70mg'$ , HDL >'40mg'$ .   Expected Outcomes Short Term: Participant states understanding of desired cholesterol values and is compliant with medications prescribed. Participant is following exercise prescription and nutrition guidelines.;Long Term: Cholesterol controlled with medications as prescribed, with individualized exercise RX and with personalized nutrition plan. Value goals: LDL < '70mg'$ , HDL > 40 mg.      Tobacco  Use Initial Evaluation: History  Smoking Status  . Former Smoker  . Types: Cigarettes  . Quit date: 12/30/1965  Smokeless Tobacco  . Never Used    Copy of goals given to participant.

## 2016-09-19 NOTE — Progress Notes (Signed)
Daily Session Note  Patient Details  Name: [name] [name] MRN: [id number] Date of Birth: 03-27-1936 Referring Provider:   Flowsheet Row Cardiac Rehab from 09/19/2016 in [facility] Cardiac and Pulmonary Rehab  Referring Provider  Loralie Champagne MD      Encounter Date: 09/19/2016  Check In:     Session Check In - 09/19/16 1456      Check-In   Location [facility]-Cardiac & Pulmonary Rehab   Staff Present Gerlene Burdock, RN, Levie Heritage, MA, ACSM RCEP, Exercise Physiologist   Supervising physician immediately available to respond to emergencies See telemetry face sheet for immediately available ER MD   Medication changes reported     No   Fall or balance concerns reported    No   Warm-up and Cool-down Not performed (comment)  6 min walk test done.   Resistance Training Performed No   VAD Patient? No     Pain Assessment   Currently in Pain? No/denies           Exercise Prescription Changes - 09/19/16 1500      Exercise Review   Progression --  walk test results     Response to Exercise   Blood Pressure (Admit) 110/60   Blood Pressure (Exercise) 124/70   Blood Pressure (Exit) 116/64   Heart Rate (Admit) 64 bpm   Heart Rate (Exercise) 113 bpm   Heart Rate (Exit) 73 bpm   Oxygen Saturation (Admit) 97 %   Oxygen Saturation (Exit) 98 %   Rating of Perceived Exertion (Exercise) 7   Perceived Dyspnea (Exercise) 2   Symptoms slight SOB      Goals Met:  Proper associated with RPD/PD & O2 Sat Exercise tolerated well No report of cardiac concerns or symptoms  Goals Unmet:  Not Applicable  Comments:     Dr. Emily Filbert is Medical Director for [place] and [facility].

## 2016-09-23 NOTE — Progress Notes (Signed)
Cardiac Individual Treatment Plan  Patient Details  Name: [name] [name] MRN: [id number] Date of Birth: 1936-01-26 Referring Provider:   Flowsheet Row Cardiac Rehab from 09/19/2016 in [facility] Cardiac and Pulmonary Rehab  Referring Provider  Loralie Champagne MD      Initial Encounter Date:  Flowsheet Row Cardiac Rehab from 09/19/2016 in [facility] Cardiac and Pulmonary Rehab  Date  09/19/16  Referring Provider  Loralie Champagne MD      Visit Diagnosis: S/P CABG x 4  Patient's Home Medications on Admission:  Current Outpatient Prescriptions:  .  acetaminophen (TYLENOL) 325 MG tablet, Take 2 tablets (650 mg total) by mouth every 6 (six) hours as needed for mild pain., Disp: , Rfl:  .  Ascorbic Acid (VITAMIN C) 1000 MG tablet, Take 1,000 mg by mouth daily., Disp: , Rfl:  .  aspirin 81 MG tablet, Take 81 mg by mouth daily., Disp: , Rfl:  .  b complex vitamins tablet, Take 1 tablet by mouth daily., Disp: , Rfl:  .  carvedilol (COREG) 6.25 MG tablet, Take 1 tablet (6.25 mg total) by mouth 2 (two) times daily., Disp: 60 tablet, Rfl: 3 .  digoxin (LANOXIN) 0.125 MG tablet, Take 0.0625 mg by mouth daily., Disp: , Rfl:  .  ezetimibe (ZETIA) 10 MG tablet, Take 10 mg by mouth daily., Disp: , Rfl:  .  losartan (COZAAR) 25 MG tablet, Take 0.5 tablets (12.5 mg total) by mouth daily., Disp: 90 tablet, Rfl: 3 .  Multiple Vitamin (MULTIVITAMIN WITH MINERALS) TABS tablet, Take 1 tablet by mouth daily., Disp: , Rfl:  .  Omega 3 1200 MG CAPS, Take 1 capsule by mouth 2 (two) times daily. , Disp: , Rfl:  .  pyridoxine (B-6) 100 MG tablet, Take 100 mg by mouth daily., Disp: , Rfl:  .  spironolactone (ALDACTONE) 25 MG tablet, Take 1 tablet (25 mg total) by mouth daily., Disp: 30 tablet, Rfl: 3  Past Medical History: Past Medical History:  Diagnosis Date  . Arthritis    JOINT PAIN RIGHT HAND  . BPH (benign prostatic hyperplasia)    [name]/elevated PSA, prostate biopsy x4 including one saturation biopsy, laser  treatment 2/14; NOCTURIA  . CAD (coronary artery disease) 2013   cath 05/2012 showing 80-90% stenosis of a trifurcating diagonal #1, 70-80% stenosis of OM3 and 90% stenosis of distal LCx after OM3 - medical management, [place]  . Cardiomyopathy ([place])    dilated cardiomyopathy EF 30%, MUGA  EF 42% 08/2012  . Carotid artery occlusion    carotid artery bruit  . Chronic kidney disease    kidney stones -small passed.  . Diastolic dysfunction   . Heart murmur    as a child  . History of shingles   . Hyperlipidemia    statin intolerant  . Hypertension   . Lung mass    Stage 1B non-small cell lung CA s/p resection 2013  . PVC (premature ventricular contraction)   . Shingles   . Sigmoid diverticulosis   . Skin cancer    Multiple skin cancers   . Squamous cell carcinoma, face    history of right face with mets to right upper cheek in 2004  and facial lymph node reoccurence post surgery with XRT  . Trigger finger    Bilateral    Tobacco Use: History  Smoking Status  . Former Smoker  . Types: Cigarettes  . Quit date: 12/30/1965  Smokeless Tobacco  . Never Used    Labs: Recent Review Flowsheet Data  Labs for ITP Cardiac and Pulmonary Rehab Latest Ref Rng & Units 07/19/2016 07/19/2016 07/20/2016 07/22/2016 09/06/2016   Cholestrol 0 - 200 mg/dL - - - - 174   LDLCALC 0 - 99 mg/dL - - - - 110(H)   LDLDIRECT mg/dL - - - - -   HDL >40 mg/dL - - - - 46   Trlycerides <150 mg/dL - - - - 91   Hemoglobin A1c 4.8 - 5.6 % - - - - -   PHART 7.350 - 7.450 - 7.318(L) - - -   PCO2ART 35.0 - 45.0 mmHg - 45.0 - - -   HCO3 20.0 - 24.0 mEq/L - 23.0 - - -   TCO2 0 - 100 mmol/L '23 24 20 '$ - -   ACIDBASEDEF 0.0 - 2.0 mmol/L - 3.0(H) - - -   O2SAT % - 98.0 - 59.3 -       Exercise Target Goals: Date: 09/19/16  Exercise Program Goal: Individual exercise prescription set with THRR, safety & activity barriers. Participant demonstrates ability to understand and report RPE using BORG scale, to self-measure  pulse accurately, and to acknowledge the importance of the exercise prescription.  Exercise Prescription Goal: Starting with aerobic activity 30 plus minutes a day, 3 days per week for initial exercise prescription. Provide home exercise prescription and guidelines that participant acknowledges understanding prior to discharge.  Activity Barriers & Risk Stratification:     Activity Barriers & Cardiac Risk Stratification - 09/19/16 1455      Activity Barriers & Cardiac Risk Stratification   Activity Barriers Deconditioning;Muscular Weakness;Balance Concerns;Shortness of Breath  unable to lift over head from myalgias, occasional dizziness   Cardiac Risk Stratification High      6 Minute Walk:     6 Minute Walk    Row Name 09/19/16 1502         6 Minute Walk   Phase Initial     Distance 1290 feet     Walk Time 6 minutes     # of Rest Breaks 0     MPH 2.44     METS 2.46     RPE 7     Perceived Dyspnea  2     VO2 Peak 8.61     Symptoms Yes (comment)     Comments slightly short of breath     Resting HR 64 bpm     Resting BP 110/60     Max Ex. HR 113 bpm     Max Ex. BP 124/70     2 Minute Post BP 116/64        Initial Exercise Prescription:     Initial Exercise Prescription - 09/19/16 1500      Date of Initial Exercise RX and Referring Provider   Date 09/19/16   Referring Provider Loralie Champagne MD     Treadmill   MPH 2.5   Grade 0.5   Minutes 15   METs 3.09     NuStep   Level 3   Watts --  80-100 spm   Minutes 15   METs 2     REL-XR   Level 2   Watts --  speed >50   Minutes 15   METs 2     Prescription Details   Frequency (times per week) 2   Duration Progress to 45 minutes of aerobic exercise without signs/symptoms of physical distress     Intensity   THRR 40-80% of Max Heartrate 95-126   Ratings of Perceived  Exertion 11-15   Perceived Dyspnea 0-4     Progression   Progression Continue to progress workloads to maintain intensity without  signs/symptoms of physical distress.     Resistance Training   Training Prescription Yes   Weight 3 lbs   Reps 10-12      Perform Capillary Blood Glucose checks as needed.  Exercise Prescription Changes:     Exercise Prescription Changes    Row Name 09/19/16 1500             Exercise Review   Progression -  walk test results         Response to Exercise   Blood Pressure (Admit) 110/60       Blood Pressure (Exercise) 124/70       Blood Pressure (Exit) 116/64       Heart Rate (Admit) 64 bpm       Heart Rate (Exercise) 113 bpm       Heart Rate (Exit) 73 bpm       Oxygen Saturation (Admit) 97 %       Oxygen Saturation (Exit) 98 %       Rating of Perceived Exertion (Exercise) 7       Perceived Dyspnea (Exercise) 2       Symptoms slight SOB          Exercise Comments:     Exercise Comments    Row Name 09/19/16 1506           Exercise Comments Main goal is to increase strength and stamina to regain lifestyle and not fatigue so easily.          Discharge Exercise Prescription (Final Exercise Prescription Changes):     Exercise Prescription Changes - 09/19/16 1500      Exercise Review   Progression --  walk test results     Response to Exercise   Blood Pressure (Admit) 110/60   Blood Pressure (Exercise) 124/70   Blood Pressure (Exit) 116/64   Heart Rate (Admit) 64 bpm   Heart Rate (Exercise) 113 bpm   Heart Rate (Exit) 73 bpm   Oxygen Saturation (Admit) 97 %   Oxygen Saturation (Exit) 98 %   Rating of Perceived Exertion (Exercise) 7   Perceived Dyspnea (Exercise) 2   Symptoms slight SOB      Nutrition:  Target Goals: Understanding of nutrition guidelines, daily intake of sodium '1500mg'$ , cholesterol '200mg'$ , calories 30% from fat and 7% or less from saturated fats, daily to have 5 or more servings of fruits and vegetables.  Biometrics:     Pre Biometrics - 09/19/16 1507      Pre Biometrics   Height 5' 3.5" (1.613 m)   Weight 155 lb 11.2 oz  (70.6 kg)   Waist Circumference 37 inches   Hip Circumference 37 inches   Waist to Hip Ratio 1 %   BMI (Calculated) 27.2   Single Leg Stand 30 seconds       Nutrition Therapy Plan and Nutrition Goals:     Nutrition Therapy & Goals - 09/19/16 1501      Nutrition Therapy   Drug/Food Interactions Statins/Certain Fruits     Intervention Plan   Intervention Prescribe, educate and counsel regarding individualized specific dietary modifications aiming towards targeted core components such as weight, hypertension, lipid management, diabetes, heart failure and other comorbidities.;Nutrition handout(s) given to patient.   Expected Outcomes Short Term Goal: Understand basic principles of dietary content, such as calories, fat, sodium, cholesterol and nutrients.;Short  Term Goal: A plan has been developed with personal nutrition goals set during dietitian appointment.;Long Term Goal: Adherence to prescribed nutrition plan.      Nutrition Discharge: Rate Your Plate Scores:     Nutrition Assessments - 09/19/16 1504      Rate Your Plate Scores   Pre Score 66   Pre Score % 73.3 %      Nutrition Goals Re-Evaluation:   Psychosocial: Target Goals: Acknowledge presence or absence of depression, maximize coping skills, provide positive support system. Participant is able to verbalize types and ability to use techniques and skills needed for reducing stress and depression.  Initial Review & Psychosocial Screening:     Initial Psych Review & Screening - 09/19/16 [place]? Yes     Barriers   Psychosocial barriers to participate in program There are no identifiable barriers or psychosocial needs.     Screening Interventions   Interventions Encouraged to exercise      Quality of Life Scores:     Quality of Life - 09/19/16 1453      Quality of Life Scores   Health/Function Pre 24.17 %   Socioeconomic Pre 29.64 %   Psych/Spiritual Pre 28.29 %    Family Pre 25.2 %   GLOBAL Pre 26.29 %      PHQ-9: Recent Review Flowsheet Data    Depression screen [facility] 2/9 09/19/2016 08/07/2016   Decreased Interest 0 0   Down, Depressed, Hopeless 0 0   PHQ - 2 Score 0 0   Altered sleeping 1 -   Tired, decreased energy 1 -   Change in appetite 0 -   Feeling bad or failure about yourself  0 -   Trouble concentrating 0 -   Moving slowly or fidgety/restless 1 -   Suicidal thoughts 0 -   PHQ-9 Score 3 -   Difficult doing work/chores Not difficult at all -      Psychosocial Evaluation and Intervention:   Psychosocial Re-Evaluation:   Vocational Rehabilitation: Provide vocational rehab assistance to qualifying candidates.   Vocational Rehab Evaluation & Intervention:     Vocational Rehab - 09/19/16 1456      Initial Vocational Rehab Evaluation & Intervention   Assessment shows need for Vocational Rehabilitation No      Education: Education Goals: Education classes will be provided on a weekly basis, covering required topics. Participant will state understanding/return demonstration of topics presented.  Learning Barriers/Preferences:     Learning Barriers/Preferences - 09/19/16 1455      Learning Barriers/Preferences   Learning Barriers None   Learning Preferences Individual Instruction;Verbal Instruction      Education Topics: General Nutrition Guidelines/Fats and Fiber: -Group instruction provided by verbal, written material, models and posters to present the general guidelines for heart healthy nutrition. Gives an explanation and review of dietary fats and fiber.   Controlling Sodium/Reading Food Labels: -Group verbal and written material supporting the discussion of sodium use in heart healthy nutrition. Review and explanation with models, verbal and written materials for utilization of the food label.   Exercise Physiology & Risk Factors: - Group verbal and written instruction with models to review the exercise  physiology of the cardiovascular system and associated critical values. Details cardiovascular disease risk factors and the goals associated with each risk factor.   Aerobic Exercise & Resistance Training: - Gives group verbal and written discussion on the health impact of inactivity. On the components of  aerobic and resistive training programs and the benefits of this training and how to safely progress through these programs.   Flexibility, Balance, General Exercise Guidelines: - Provides group verbal and written instruction on the benefits of flexibility and balance training programs. Provides general exercise guidelines with specific guidelines to those with heart or lung disease. Demonstration and skill practice provided.   Stress Management: - Provides group verbal and written instruction about the health risks of elevated stress, cause of high stress, and healthy ways to reduce stress.   Depression: - Provides group verbal and written instruction on the correlation between heart/lung disease and depressed mood, treatment options, and the stigmas associated with seeking treatment.   Anatomy & Physiology of the Heart: - Group verbal and written instruction and models provide basic cardiac anatomy and physiology, with the coronary electrical and arterial systems. Review of: AMI, Angina, Valve disease, Heart Failure, Cardiac Arrhythmia, Pacemakers, and the ICD.   Cardiac Procedures: - Group verbal and written instruction and models to describe the testing methods done to diagnose heart disease. Reviews the outcomes of the test results. Describes the treatment choices: Medical Management, Angioplasty, or Coronary Bypass Surgery.   Cardiac Medications: - Group verbal and written instruction to review commonly prescribed medications for heart disease. Reviews the medication, class of the drug, and side effects. Includes the steps to properly store meds and maintain the prescription  regimen.   Go Sex-Intimacy & Heart Disease, Get SMART - Goal Setting: - Group verbal and written instruction through game format to discuss heart disease and the return to sexual intimacy. Provides group verbal and written material to discuss and apply goal setting through the application of the S.M.A.R.T. Method.   Other Matters of the Heart: - Provides group verbal, written materials and models to describe Heart Failure, Angina, Valve Disease, and Diabetes in the realm of heart disease. Includes description of the disease process and treatment options available to the cardiac patient.   Exercise & Equipment Safety: - Individual verbal instruction and demonstration of equipment use and safety with use of the equipment. Flowsheet Row Cardiac Rehab from 09/19/2016 in [facility] Cardiac and Pulmonary Rehab  Date  09/19/16  Educator  C. EnterkinRN  Instruction Review Code  1- partially meets, needs review/practice      Infection Prevention: - Provides verbal and written material to individual with discussion of infection control including proper hand washing and proper equipment cleaning during exercise session. Flowsheet Row Cardiac Rehab from 09/19/2016 in [facility] Cardiac and Pulmonary Rehab  Date  09/19/16  Educator  C. EnterkinRN  Instruction Review Code  2- meets goals/outcomes      Falls Prevention: - Provides verbal and written material to individual with discussion of falls prevention and safety. Flowsheet Row Cardiac Rehab from 09/19/2016 in [facility] Cardiac and Pulmonary Rehab  Date  09/19/16  Educator  C. [place]  Instruction Review Code  2- meets goals/outcomes      Diabetes: - Individual verbal and written instruction to review signs/symptoms of diabetes, desired ranges of glucose level fasting, after meals and with exercise. Advice that pre and post exercise glucose checks will be done for 3 sessions at entry of program.    Knowledge Questionnaire Score:     Knowledge  Questionnaire Score - 09/19/16 1455      Knowledge Questionnaire Score   Pre Score 21/28      Core Components/Risk Factors/Patient Goals at Admission:     Personal Goals and Risk Factors at Admission - 09/19/16 1505  Core Components/Risk Factors/Patient Goals on Admission   Hypertension Yes   Intervention Provide education on lifestyle modifcations including regular physical activity/exercise, weight management, moderate sodium restriction and increased consumption of fresh fruit, vegetables, and low fat dairy, alcohol moderation, and smoking cessation.;Monitor prescription use compliance.   Expected Outcomes Short Term: Continued assessment and intervention until BP is < 140/73m HG in hypertensive participants. < 130/840mHG in hypertensive participants with diabetes, heart failure or chronic kidney disease.;Long Term: Maintenance of blood pressure at goal levels.   Lipids Yes   Intervention Provide education and support for participant on nutrition & aerobic/resistive exercise along with prescribed medications to achieve LDL '70mg'$ , HDL >'40mg'$ .   Expected Outcomes Short Term: Participant states understanding of desired cholesterol values and is compliant with medications prescribed. Participant is following exercise prescription and nutrition guidelines.;Long Term: Cholesterol controlled with medications as prescribed, with individualized exercise RX and with personalized nutrition plan. Value goals: LDL < '70mg'$ , HDL > 40 mg.      Core Components/Risk Factors/Patient Goals Review:    Core Components/Risk Factors/Patient Goals at Discharge (Final Review):    ITP Comments:     ITP Comments    Row Name 09/23/16 0949           ITP Comments I returned [name]'s vm [place] [facility] will contact [name] [name]        Comments: I  returned [name]'s vm that [place] and spoke to JaPinelandt [facility] she will contact [name] LeUnitedHealth

## 2016-09-24 ENCOUNTER — Ambulatory Visit: Payer: PPO

## 2016-09-26 ENCOUNTER — Ambulatory Visit: Payer: PPO

## 2016-09-26 DIAGNOSIS — L570 Actinic keratosis: Secondary | ICD-10-CM | POA: Diagnosis not present

## 2016-09-26 DIAGNOSIS — C44329 Squamous cell carcinoma of skin of other parts of face: Secondary | ICD-10-CM | POA: Diagnosis not present

## 2016-09-26 DIAGNOSIS — Z85828 Personal history of other malignant neoplasm of skin: Secondary | ICD-10-CM | POA: Diagnosis not present

## 2016-09-26 DIAGNOSIS — L821 Other seborrheic keratosis: Secondary | ICD-10-CM | POA: Diagnosis not present

## 2016-09-26 DIAGNOSIS — D485 Neoplasm of uncertain behavior of skin: Secondary | ICD-10-CM | POA: Diagnosis not present

## 2016-10-01 ENCOUNTER — Ambulatory Visit: Payer: PPO

## 2016-10-02 ENCOUNTER — Encounter: Payer: Self-pay | Admitting: *Deleted

## 2016-10-02 DIAGNOSIS — Z951 Presence of aortocoronary bypass graft: Secondary | ICD-10-CM

## 2016-10-02 NOTE — Progress Notes (Signed)
Cardiac Individual Treatment Plan  Patient Details  Name: [name] [name] MRN: [id number] Date of Birth: [birth year] Referring Provider:   Flowsheet Row Cardiac Rehab from 09/19/2016 in [facility] Cardiac and Pulmonary Rehab  Referring Provider  [name] Champagne MD      Initial Encounter Date:  Flowsheet Row Cardiac Rehab from 09/19/2016 in [facility] Cardiac and Pulmonary Rehab  Date  09/19/16  Referring Provider  [name] Champagne MD      Visit Diagnosis: No diagnosis found.  Patient's Home Medications on Admission:  Current Outpatient Prescriptions:  .  acetaminophen (TYLENOL) 325 MG tablet, Take 2 tablets (650 mg total) by mouth every 6 (six) hours as needed for mild pain., Disp: , Rfl:  .  Ascorbic Acid (VITAMIN C) 1000 MG tablet, Take 1,000 mg by mouth daily., Disp: , Rfl:  .  aspirin 81 MG tablet, Take 81 mg by mouth daily., Disp: , Rfl:  .  b complex vitamins tablet, Take 1 tablet by mouth daily., Disp: , Rfl:  .  carvedilol (COREG) 6.25 MG tablet, Take 1 tablet (6.25 mg total) by mouth 2 (two) times daily., Disp: 60 tablet, Rfl: 3 .  digoxin (LANOXIN) 0.125 MG tablet, Take 0.0625 mg by mouth daily., Disp: , Rfl:  .  ezetimibe (ZETIA) 10 MG tablet, Take 10 mg by mouth daily., Disp: , Rfl:  .  losartan (COZAAR) 25 MG tablet, Take 0.5 tablets (12.5 mg total) by mouth daily., Disp: 90 tablet, Rfl: 3 .  Multiple Vitamin (MULTIVITAMIN WITH MINERALS) TABS tablet, Take 1 tablet by mouth daily., Disp: , Rfl:  .  Omega 3 1200 MG CAPS, Take 1 capsule by mouth 2 (two) times daily. , Disp: , Rfl:  .  pyridoxine (B-6) 100 MG tablet, Take 100 mg by mouth daily., Disp: , Rfl:  .  spironolactone (ALDACTONE) 25 MG tablet, Take 1 tablet (25 mg total) by mouth daily., Disp: 30 tablet, Rfl: 3  Past Medical History: Past Medical History:  Diagnosis Date  . Arthritis    JOINT PAIN RIGHT HAND  . BPH (benign prostatic hyperplasia)    [name]/elevated PSA, prostate biopsy x4 including one saturation biopsy,  laser treatment 2/14; NOCTURIA  . CAD (coronary artery disease) 2013   cath 05/2012 showing 80-90% stenosis of a trifurcating diagonal #1, 70-80% stenosis of OM3 and 90% stenosis of distal LCx after OM3 - medical management, [place]  . Cardiomyopathy ([place])    dilated cardiomyopathy EF 30%, MUGA  EF 42% 08/2012  . Carotid artery occlusion    carotid artery bruit  . Chronic kidney disease    kidney stones -small passed.  . Diastolic dysfunction   . Heart murmur    as a child  . History of shingles   . Hyperlipidemia    statin intolerant  . Hypertension   . Lung mass    Stage 1B non-small cell lung CA s/p resection 2013  . PVC (premature ventricular contraction)   . Shingles   . Sigmoid diverticulosis   . Skin cancer    Multiple skin cancers   . Squamous cell carcinoma, face    history of right face with mets to right upper cheek in 2004  and facial lymph node reoccurence post surgery with XRT  . Trigger finger    Bilateral    Tobacco Use: History  Smoking Status  . Former Smoker  . Types: Cigarettes  . Quit date: 12/30/1965  Smokeless Tobacco  . Never Used    Labs: Recent Review Flowsheet Data  Labs for ITP Cardiac and Pulmonary Rehab Latest Ref Rng & Units 07/19/2016 07/19/2016 07/20/2016 07/22/2016 09/06/2016   Cholestrol 0 - 200 mg/dL - - - - 174   LDLCALC 0 - 99 mg/dL - - - - 110(H)   LDLDIRECT mg/dL - - - - -   HDL >40 mg/dL - - - - 46   Trlycerides <150 mg/dL - - - - 91   Hemoglobin A1c 4.8 - 5.6 % - - - - -   PHART 7.350 - 7.450 - 7.318(L) - - -   PCO2ART 35.0 - 45.0 mmHg - 45.0 - - -   HCO3 20.0 - 24.0 mEq/L - 23.0 - - -   TCO2 0 - 100 mmol/L '23 24 20 '$ - -   ACIDBASEDEF 0.0 - 2.0 mmol/L - 3.0(H) - - -   O2SAT % - 98.0 - 59.3 -       Exercise Target Goals:    Exercise Program Goal: Individual exercise prescription set with THRR, safety & activity barriers. Participant demonstrates ability to understand and report RPE using BORG scale, to self-measure pulse  accurately, and to acknowledge the importance of the exercise prescription.  Exercise Prescription Goal: Starting with aerobic activity 30 plus minutes a day, 3 days per week for initial exercise prescription. Provide home exercise prescription and guidelines that participant acknowledges understanding prior to discharge.  Activity Barriers & Risk Stratification:     Activity Barriers & Cardiac Risk Stratification - 09/19/16 1455      Activity Barriers & Cardiac Risk Stratification   Activity Barriers Deconditioning;Muscular Weakness;Balance Concerns;Shortness of Breath  unable to lift over head from myalgias, occasional dizziness   Cardiac Risk Stratification High      6 Minute Walk:     6 Minute Walk    Row Name 09/19/16 1502         6 Minute Walk   Phase Initial     Distance 1290 feet     Walk Time 6 minutes     # of Rest Breaks 0     MPH 2.44     METS 2.46     RPE 7     Perceived Dyspnea  2     VO2 Peak 8.61     Symptoms Yes (comment)     Comments slightly short of breath     Resting HR 64 bpm     Resting BP 110/60     Max Ex. HR 113 bpm     Max Ex. BP 124/70     2 Minute Post BP 116/64        Initial Exercise Prescription:     Initial Exercise Prescription - 09/19/16 1500      Date of Initial Exercise RX and Referring Provider   Date 09/19/16   Referring Provider [name] Champagne MD     Treadmill   MPH 2.5   Grade 0.5   Minutes 15   METs 3.09     NuStep   Level 3   Watts --  80-100 spm   Minutes 15   METs 2     REL-XR   Level 2   Watts --  speed >50   Minutes 15   METs 2     Prescription Details   Frequency (times per week) 2   Duration Progress to 45 minutes of aerobic exercise without signs/symptoms of physical distress     Intensity   THRR 40-80% of Max Heartrate 95-126   Ratings of Perceived  Exertion 11-15   Perceived Dyspnea 0-4     Progression   Progression Continue to progress workloads to maintain intensity without  signs/symptoms of physical distress.     Resistance Training   Training Prescription Yes   Weight 3 lbs   Reps 10-12      Perform Capillary Blood Glucose checks as needed.  Exercise Prescription Changes:     Exercise Prescription Changes    Row Name 09/19/16 1500             Exercise Review   Progression -  walk test results         Response to Exercise   Blood Pressure (Admit) 110/60       Blood Pressure (Exercise) 124/70       Blood Pressure (Exit) 116/64       Heart Rate (Admit) 64 bpm       Heart Rate (Exercise) 113 bpm       Heart Rate (Exit) 73 bpm       Oxygen Saturation (Admit) 97 %       Oxygen Saturation (Exit) 98 %       Rating of Perceived Exertion (Exercise) 7       Perceived Dyspnea (Exercise) 2       Symptoms slight SOB          Exercise Comments:     Exercise Comments    Row Name 09/19/16 1506           Exercise Comments Main goal is to increase strength and stamina to regain lifestyle and not fatigue so easily.          Discharge Exercise Prescription (Final Exercise Prescription Changes):     Exercise Prescription Changes - 09/19/16 1500      Exercise Review   Progression --  walk test results     Response to Exercise   Blood Pressure (Admit) 110/60   Blood Pressure (Exercise) 124/70   Blood Pressure (Exit) 116/64   Heart Rate (Admit) 64 bpm   Heart Rate (Exercise) 113 bpm   Heart Rate (Exit) 73 bpm   Oxygen Saturation (Admit) 97 %   Oxygen Saturation (Exit) 98 %   Rating of Perceived Exertion (Exercise) 7   Perceived Dyspnea (Exercise) 2   Symptoms slight SOB      Nutrition:  Target Goals: Understanding of nutrition guidelines, daily intake of sodium '1500mg'$ , cholesterol '200mg'$ , calories 30% from fat and 7% or less from saturated fats, daily to have 5 or more servings of fruits and vegetables.  Biometrics:     Pre Biometrics - 09/19/16 1507      Pre Biometrics   Height 5' 3.5" (1.613 m)   Weight 155 lb 11.2 oz  (70.6 kg)   Waist Circumference 37 inches   Hip Circumference 37 inches   Waist to Hip Ratio 1 %   BMI (Calculated) 27.2   Single Leg Stand 30 seconds       Nutrition Therapy Plan and Nutrition Goals:     Nutrition Therapy & Goals - 09/19/16 1501      Nutrition Therapy   Drug/Food Interactions Statins/Certain Fruits     Intervention Plan   Intervention Prescribe, educate and counsel regarding individualized specific dietary modifications aiming towards targeted core components such as weight, hypertension, lipid management, diabetes, heart failure and other comorbidities.;Nutrition handout(s) given to patient.   Expected Outcomes Short Term Goal: Understand basic principles of dietary content, such as calories, fat, sodium, cholesterol and nutrients.;Short  Term Goal: A plan has been developed with personal nutrition goals set during dietitian appointment.;Long Term Goal: Adherence to prescribed nutrition plan.      Nutrition Discharge: Rate Your Plate Scores:     Nutrition Assessments - 09/19/16 1504      Rate Your Plate Scores   Pre Score 66   Pre Score % 73.3 %      Nutrition Goals Re-Evaluation:   Psychosocial: Target Goals: Acknowledge presence or absence of depression, maximize coping skills, provide positive support system. Participant is able to verbalize types and ability to use techniques and skills needed for reducing stress and depression.  Initial Review & Psychosocial Screening:     Initial Psych Review & Screening - 09/19/16 [place]? Yes     Barriers   Psychosocial barriers to participate in program There are no identifiable barriers or psychosocial needs.     Screening Interventions   Interventions Encouraged to exercise      Quality of Life Scores:     Quality of Life - 09/19/16 1453      Quality of Life Scores   Health/Function Pre 24.17 %   Socioeconomic Pre 29.64 %   Psych/Spiritual Pre 28.29 %    Family Pre 25.2 %   GLOBAL Pre 26.29 %      PHQ-9: Recent Review Flowsheet Data    Depression screen [facility] 2/9 09/19/2016 08/07/2016   Decreased Interest 0 0   Down, Depressed, Hopeless 0 0   PHQ - 2 Score 0 0   Altered sleeping 1 -   Tired, decreased energy 1 -   Change in appetite 0 -   Feeling bad or failure about yourself  0 -   Trouble concentrating 0 -   Moving slowly or fidgety/restless 1 -   Suicidal thoughts 0 -   PHQ-9 Score 3 -   Difficult doing work/chores Not difficult at all -      Psychosocial Evaluation and Intervention:   Psychosocial Re-Evaluation:   Vocational Rehabilitation: Provide vocational rehab assistance to qualifying candidates.   Vocational Rehab Evaluation & Intervention:     Vocational Rehab - 09/19/16 1456      Initial Vocational Rehab Evaluation & Intervention   Assessment shows need for Vocational Rehabilitation No      Education: Education Goals: Education classes will be provided on a weekly basis, covering required topics. Participant will state understanding/return demonstration of topics presented.  Learning Barriers/Preferences:     Learning Barriers/Preferences - 09/19/16 1455      Learning Barriers/Preferences   Learning Barriers None   Learning Preferences Individual Instruction;Verbal Instruction      Education Topics: General Nutrition Guidelines/Fats and Fiber: -Group instruction provided by verbal, written material, models and posters to present the general guidelines for heart healthy nutrition. Gives an explanation and review of dietary fats and fiber.   Controlling Sodium/Reading Food Labels: -Group verbal and written material supporting the discussion of sodium use in heart healthy nutrition. Review and explanation with models, verbal and written materials for utilization of the food label.   Exercise Physiology & Risk Factors: - Group verbal and written instruction with models to review the exercise  physiology of the cardiovascular system and associated critical values. Details cardiovascular disease risk factors and the goals associated with each risk factor.   Aerobic Exercise & Resistance Training: - Gives group verbal and written discussion on the health impact of inactivity. On the components of  aerobic and resistive training programs and the benefits of this training and how to safely progress through these programs.   Flexibility, Balance, General Exercise Guidelines: - Provides group verbal and written instruction on the benefits of flexibility and balance training programs. Provides general exercise guidelines with specific guidelines to those with heart or lung disease. Demonstration and skill practice provided.   Stress Management: - Provides group verbal and written instruction about the health risks of elevated stress, cause of high stress, and healthy ways to reduce stress.   Depression: - Provides group verbal and written instruction on the correlation between heart/lung disease and depressed mood, treatment options, and the stigmas associated with seeking treatment.   Anatomy & Physiology of the Heart: - Group verbal and written instruction and models provide basic cardiac anatomy and physiology, with the coronary electrical and arterial systems. Review of: AMI, Angina, Valve disease, Heart Failure, Cardiac Arrhythmia, Pacemakers, and the ICD.   Cardiac Procedures: - Group verbal and written instruction and models to describe the testing methods done to diagnose heart disease. Reviews the outcomes of the test results. Describes the treatment choices: Medical Management, Angioplasty, or Coronary Bypass Surgery.   Cardiac Medications: - Group verbal and written instruction to review commonly prescribed medications for heart disease. Reviews the medication, class of the drug, and side effects. Includes the steps to properly store meds and maintain the prescription  regimen.   Go Sex-Intimacy & Heart Disease, Get SMART - Goal Setting: - Group verbal and written instruction through game format to discuss heart disease and the return to sexual intimacy. Provides group verbal and written material to discuss and apply goal setting through the application of the S.M.A.R.T. Method.   Other Matters of the Heart: - Provides group verbal, written materials and models to describe Heart Failure, Angina, Valve Disease, and Diabetes in the realm of heart disease. Includes description of the disease process and treatment options available to the cardiac patient.   Exercise & Equipment Safety: - Individual verbal instruction and demonstration of equipment use and safety with use of the equipment. Flowsheet Row Cardiac Rehab from 09/19/2016 in [facility] Cardiac and Pulmonary Rehab  Date  09/19/16  Educator  C. EnterkinRN  Instruction Review Code  1- partially meets, needs review/practice      Infection Prevention: - Provides verbal and written material to individual with discussion of infection control including proper hand washing and proper equipment cleaning during exercise session. Flowsheet Row Cardiac Rehab from 09/19/2016 in [facility] Cardiac and Pulmonary Rehab  Date  09/19/16  Educator  C. EnterkinRN  Instruction Review Code  2- meets goals/outcomes      Falls Prevention: - Provides verbal and written material to individual with discussion of falls prevention and safety. Flowsheet Row Cardiac Rehab from 09/19/2016 in [facility] Cardiac and Pulmonary Rehab  Date  09/19/16  Educator  C. Holcombe  Instruction Review Code  2- meets goals/outcomes      Diabetes: - Individual verbal and written instruction to review signs/symptoms of diabetes, desired ranges of glucose level fasting, after meals and with exercise. Advice that pre and post exercise glucose checks will be done for 3 sessions at entry of program.    Knowledge Questionnaire Score:     Knowledge  Questionnaire Score - 09/19/16 1455      Knowledge Questionnaire Score   Pre Score 21/28      Core Components/Risk Factors/Patient Goals at Admission:     Personal Goals and Risk Factors at Admission - 09/19/16 1505  Core Components/Risk Factors/Patient Goals on Admission   Hypertension Yes   Intervention Provide education on lifestyle modifcations including regular physical activity/exercise, weight management, moderate sodium restriction and increased consumption of fresh fruit, vegetables, and low fat dairy, alcohol moderation, and smoking cessation.;Monitor prescription use compliance.   Expected Outcomes Short Term: Continued assessment and intervention until BP is < 140/58m HG in hypertensive participants. < 130/866mHG in hypertensive participants with diabetes, heart failure or chronic kidney disease.;Long Term: Maintenance of blood pressure at goal levels.   Lipids Yes   Intervention Provide education and support for participant on nutrition & aerobic/resistive exercise along with prescribed medications to achieve LDL '70mg'$ , HDL >'40mg'$ .   Expected Outcomes Short Term: Participant states understanding of desired cholesterol values and is compliant with medications prescribed. Participant is following exercise prescription and nutrition guidelines.;Long Term: Cholesterol controlled with medications as prescribed, with individualized exercise RX and with personalized nutrition plan. Value goals: LDL < '70mg'$ , HDL > 40 mg.      Core Components/Risk Factors/Patient Goals Review:    Core Components/Risk Factors/Patient Goals at Discharge (Final Review):    ITP Comments:     ITP Comments    Row Name 09/23/16 0949           ITP Comments I returned Ed's vm [place] [facility] Ed [place] [facility]        Comments:  Ed[place] [facility] [place].

## 2016-10-03 ENCOUNTER — Encounter (HOSPITAL_COMMUNITY)
Admission: RE | Admit: 2016-10-03 | Discharge: 2016-10-03 | Disposition: A | Discharge status: Home / Self Care | Payer: PPO | Source: Ambulatory Visit | Attending: Cardiology | Admitting: Cardiology

## 2016-10-03 ENCOUNTER — Encounter (HOSPITAL_COMMUNITY): Payer: Self-pay

## 2016-10-03 ENCOUNTER — Ambulatory Visit: Payer: PPO

## 2016-10-03 VITALS — BP 102/60 | HR 70 | Ht 63.0 in | Wt 155.4 lb

## 2016-10-03 DIAGNOSIS — I10 Essential (primary) hypertension: Secondary | ICD-10-CM | POA: Insufficient documentation

## 2016-10-03 DIAGNOSIS — Z85828 Personal history of other malignant neoplasm of skin: Secondary | ICD-10-CM | POA: Diagnosis not present

## 2016-10-03 DIAGNOSIS — Z87891 Personal history of nicotine dependence: Secondary | ICD-10-CM | POA: Diagnosis not present

## 2016-10-03 DIAGNOSIS — Z85118 Personal history of other malignant neoplasm of bronchus and lung: Secondary | ICD-10-CM | POA: Insufficient documentation

## 2016-10-03 DIAGNOSIS — Z87442 Personal history of urinary calculi: Secondary | ICD-10-CM | POA: Diagnosis not present

## 2016-10-03 DIAGNOSIS — Z951 Presence of aortocoronary bypass graft: Secondary | ICD-10-CM | POA: Insufficient documentation

## 2016-10-03 DIAGNOSIS — Z79899 Other long term (current) drug therapy: Secondary | ICD-10-CM | POA: Diagnosis not present

## 2016-10-03 DIAGNOSIS — Z7982 Long term (current) use of aspirin: Secondary | ICD-10-CM | POA: Diagnosis not present

## 2016-10-03 DIAGNOSIS — I2510 Atherosclerotic heart disease of native coronary artery without angina pectoris: Secondary | ICD-10-CM | POA: Insufficient documentation

## 2016-10-03 DIAGNOSIS — M1990 Unspecified osteoarthritis, unspecified site: Secondary | ICD-10-CM | POA: Diagnosis not present

## 2016-10-03 DIAGNOSIS — E785 Hyperlipidemia, unspecified: Secondary | ICD-10-CM | POA: Insufficient documentation

## 2016-10-03 NOTE — Progress Notes (Signed)
Cardiac Rehab Medication Review by a Pharmacist  Does the patient  feel that his/her medications are working for him/her?  yes  Has the patient been experiencing any side effects to the medications prescribed?  yes  Does the patient measure his/her own blood pressure or blood glucose at home?  Occasionally   Does the patient have any problems obtaining medications due to transportation or finances?   no  Understanding of regimen: good Understanding of indications: fair Potential of compliance: good  Pharmacist comments: [name] [name] is a 80 year old male who presents to cardiac rehab today in good spirits. He is pretty familiar with his medications and seems to have good compliance because his wife helps him manage them. We had a short discussion about PCSK9 inhibitors because of his statin intolerance with regards to costs and benefits. The medication would likely be expensive for him with his Medicare insurance but said he would likely be talking with his provider about them again to discuss further.   [name] [name], PharmD Acute Care Pharmacy Resident  Pager: [phone] 10/03/2016

## 2016-10-03 NOTE — Progress Notes (Signed)
Cardiac Individual Treatment Plan  Patient Details  Name: [name] [name] MRN: [id number] Date of Birth: [birth year] Referring Provider:   Flowsheet Row CARDIAC REHAB PHASE II ORIENTATION from 10/03/2016 in [place]  Referring Provider  Loralie Champagne MD      Initial Encounter Date:  [facility] PHASE II ORIENTATION from 10/03/2016 in [facility]  Date  10/03/16  Referring Provider  Loralie Champagne MD      Visit Diagnosis: 07/19/16 S/P CABG x 4  Patient's Home Medications on Admission:  Current Outpatient Prescriptions:  .  acetaminophen (TYLENOL) 325 MG tablet, Take 2 tablets (650 mg total) by mouth every 6 (six) hours as needed for mild pain., Disp: , Rfl:  .  Ascorbic Acid (VITAMIN C) 1000 MG tablet, Take 1,000 mg by mouth daily., Disp: , Rfl:  .  aspirin 81 MG tablet, Take 81 mg by mouth daily., Disp: , Rfl:  .  b complex vitamins tablet, Take 1 tablet by mouth daily., Disp: , Rfl:  .  carvedilol (COREG) 6.25 MG tablet, Take 1 tablet (6.25 mg total) by mouth 2 (two) times daily., Disp: 60 tablet, Rfl: 3 .  digoxin (LANOXIN) 0.125 MG tablet, Take 0.0625 mg by mouth daily., Disp: , Rfl:  .  ezetimibe (ZETIA) 10 MG tablet, Take 10 mg by mouth daily., Disp: , Rfl:  .  losartan (COZAAR) 25 MG tablet, Take 0.5 tablets (12.5 mg total) by mouth daily., Disp: 90 tablet, Rfl: 3 .  Multiple Vitamin (MULTIVITAMIN WITH MINERALS) TABS tablet, Take 1 tablet by mouth daily., Disp: , Rfl:  .  Omega 3 1200 MG CAPS, Take 1 capsule by mouth 2 (two) times daily. , Disp: , Rfl:  .  pyridoxine (B-6) 100 MG tablet, Take 100 mg by mouth daily., Disp: , Rfl:  .  spironolactone (ALDACTONE) 25 MG tablet, Take 1 tablet (25 mg total) by mouth daily., Disp: 30 tablet, Rfl: 3  Past Medical History: Past Medical History:  Diagnosis Date  . Arthritis    JOINT PAIN RIGHT HAND  . BPH (benign prostatic hyperplasia)    [name]/elevated PSA, prostate biopsy x4 including one saturation biopsy, laser treatment 2/14; NOCTURIA  . CAD (coronary artery disease) 2013   cath 05/2012 showing 80-90% stenosis of a trifurcating diagonal #1, 70-80% stenosis of OM3 and 90% stenosis of distal LCx after OM3 - medical management, [facility]  . Cardiomyopathy ([place])    dilated cardiomyopathy EF 30%, MUGA  EF 42% 08/2012  . Carotid artery occlusion    carotid artery bruit  . Chronic kidney disease    kidney stones -small passed.  . Diastolic dysfunction   . Heart murmur    as a child  . History of shingles   . Hyperlipidemia    statin intolerant  . Hypertension   . Lung mass    Stage 1B non-small cell lung CA s/p resection 2013  . PVC (premature ventricular contraction)   . Shingles   . Sigmoid diverticulosis   . Skin cancer    Multiple skin cancers   . Squamous cell carcinoma, face    history of right face with mets to right upper cheek in 2004  and facial lymph node reoccurence post surgery with XRT  . Trigger finger    Bilateral    Tobacco Use: History  Smoking Status  . Former Smoker  . Types: Cigarettes  . Quit date: 12/30/1965  Smokeless Tobacco  . Never Used  Labs: Recent Review Flowsheet Data    Labs for ITP Cardiac and Pulmonary Rehab Latest Ref Rng & Units 07/19/2016 07/19/2016 07/20/2016 07/22/2016 09/06/2016   Cholestrol 0 - 200 mg/dL - - - - 174   LDLCALC 0 - 99 mg/dL - - - - 110(H)   LDLDIRECT mg/dL - - - - -   HDL >40 mg/dL - - - - 46   Trlycerides <150 mg/dL - - - - 91   Hemoglobin A1c 4.8 - 5.6 % - - - - -   PHART 7.350 - 7.450 - 7.318(L) - - -   PCO2ART 35.0 - 45.0 mmHg - 45.0 - - -   HCO3 20.0 - 24.0 mEq/L - 23.0 - - -   TCO2 0 - 100 mmol/L '23 24 20 '$ - -   ACIDBASEDEF 0.0 - 2.0 mmol/L - 3.0(H) - - -   O2SAT % - 98.0 - 59.3 -      Capillary Blood Glucose: Lab Results  Component Value Date   GLUCAP 115 (H) 07/24/2016   GLUCAP 153 (H) 07/23/2016   GLUCAP 116 (H) 07/23/2016   GLUCAP  179 (H) 07/23/2016   GLUCAP 112 (H) 07/23/2016     Exercise Target Goals: Date: 10/03/16  Exercise Program Goal: Individual exercise prescription set with THRR, safety & activity barriers. Participant demonstrates ability to understand and report RPE using BORG scale, to self-measure pulse accurately, and to acknowledge the importance of the exercise prescription.  Exercise Prescription Goal: Starting with aerobic activity 30 plus minutes a day, 3 days per week for initial exercise prescription. Provide home exercise prescription and guidelines that participant acknowledges understanding prior to discharge.  Activity Barriers & Risk Stratification:     Activity Barriers & Cardiac Risk Stratification - 10/03/16 0928      Activity Barriers & Cardiac Risk Stratification   Activity Barriers Deconditioning;Muscular Weakness;Balance Concerns;Shortness of Breath  Neuropathy in R hand   Cardiac Risk Stratification High      6 Minute Walk:     6 Minute Walk    Row Name 09/19/16 1502 10/03/16 1529       6 Minute Walk   Phase Initial Initial    Distance 1290 feet 1364 feet    Walk Time 6 minutes 6 minutes    # of Rest Breaks 0 0    MPH 2.44 2.58    METS 2.46 2.51    RPE 7 11    Perceived Dyspnea  2  -    VO2 Peak 8.61 8.79    Symptoms Yes (comment) No    Comments slightly short of breath  -    Resting HR 64 bpm 70 bpm    Resting BP 110/60 102/60    Max Ex. HR 113 bpm 104 bpm    Max Ex. BP 124/70 128/60    2 Minute Post BP 116/64 108/56       Initial Exercise Prescription:     Initial Exercise Prescription - 10/03/16 1500      Date of Initial Exercise RX and Referring Provider   Date 10/03/16   Referring Provider Loralie Champagne MD     Treadmill   MPH 2   Grade 0   Minutes 10   METs 2.53     NuStep   Level 3   Watts --   Minutes 10   METs 2     Cybex   Level 2  upright scifit   Minutes 10   METs 2  REL-XR   Level --   Watts --   Minutes --    METs --     Prescription Details   Frequency (times per week) 2     Intensity   THRR 40-80% of Max Heartrate 95-126   Ratings of Perceived Exertion 11-15   Perceived Dyspnea 0-4     Progression   Progression Continue to progress workloads to maintain intensity without signs/symptoms of physical distress.     Resistance Training   Training Prescription Yes   Weight 2lbs   Reps 10-12      Perform Capillary Blood Glucose checks as needed.  Exercise Prescription Changes:     Exercise Prescription Changes    Row Name 09/19/16 1500             Exercise Review   Progression -  walk test results         Response to Exercise   Blood Pressure (Admit) 110/60       Blood Pressure (Exercise) 124/70       Blood Pressure (Exit) 116/64       Heart Rate (Admit) 64 bpm       Heart Rate (Exercise) 113 bpm       Heart Rate (Exit) 73 bpm       Oxygen Saturation (Admit) 97 %       Oxygen Saturation (Exit) 98 %       Rating of Perceived Exertion (Exercise) 7       Perceived Dyspnea (Exercise) 2       Symptoms slight SOB          Exercise Comments:     Exercise Comments    Row Name 09/19/16 1506           Exercise Comments Main goal is to increase strength and stamina to regain lifestyle and not fatigue so easily.          Discharge Exercise Prescription (Final Exercise Prescription Changes):     Exercise Prescription Changes - 09/19/16 1500      Exercise Review   Progression --  walk test results     Response to Exercise   Blood Pressure (Admit) 110/60   Blood Pressure (Exercise) 124/70   Blood Pressure (Exit) 116/64   Heart Rate (Admit) 64 bpm   Heart Rate (Exercise) 113 bpm   Heart Rate (Exit) 73 bpm   Oxygen Saturation (Admit) 97 %   Oxygen Saturation (Exit) 98 %   Rating of Perceived Exertion (Exercise) 7   Perceived Dyspnea (Exercise) 2   Symptoms slight SOB      Nutrition:  Target Goals: Understanding of nutrition guidelines, daily intake of  sodium '1500mg'$ , cholesterol '200mg'$ , calories 30% from fat and 7% or less from saturated fats, daily to have 5 or more servings of fruits and vegetables.  Biometrics:     Pre Biometrics - 10/03/16 1521      Pre Biometrics   Weight 155 lb 6.8 oz (70.5 kg)   Hip Circumference 39.5 inches   Grip Strength 34 kg   Flexibility 0 in   Single Leg Stand 30 seconds       Nutrition Therapy Plan and Nutrition Goals:     Nutrition Therapy & Goals - 09/19/16 1501      Nutrition Therapy   Drug/Food Interactions Statins/Certain Fruits     Intervention Plan   Intervention Prescribe, educate and counsel regarding individualized specific dietary modifications aiming towards targeted core components such as weight, hypertension, lipid  management, diabetes, heart failure and other comorbidities.;Nutrition handout(s) given to patient.   Expected Outcomes Short Term Goal: Understand basic principles of dietary content, such as calories, fat, sodium, cholesterol and nutrients.;Short Term Goal: A plan has been developed with personal nutrition goals set during dietitian appointment.;Long Term Goal: Adherence to prescribed nutrition plan.      Nutrition Discharge: Nutrition Scores:     Nutrition Assessments - 09/19/16 1504      Rate Your Plate Scores   Pre Score 66   Pre Score % 73.3 %      Nutrition Goals Re-Evaluation:   Psychosocial: Target Goals: Acknowledge presence or absence of depression, maximize coping skills, provide positive support system. Participant is able to verbalize types and ability to use techniques and skills needed for reducing stress and depression.  Initial Review & Psychosocial Screening:     Initial Psych Review & Screening - 10/03/16 B and E? Yes      Quality of Life Scores:     Quality of Life - 10/03/16 1518      Quality of Life Scores   Health/Function Pre 24.47 %   Socioeconomic Pre 27.5 %   Psych/Spiritual  Pre 30 %   Family Pre 26.4 %   GLOBAL Pre 26.51 %      PHQ-9: Recent Review Flowsheet Data    Depression screen [facility] 2/9 09/19/2016 08/07/2016   Decreased Interest 0 0   Down, Depressed, Hopeless 0 0   PHQ - 2 Score 0 0   Altered sleeping 1 -   Tired, decreased energy 1 -   Change in appetite 0 -   Feeling bad or failure about yourself  0 -   Trouble concentrating 0 -   Moving slowly or fidgety/restless 1 -   Suicidal thoughts 0 -   PHQ-9 Score 3 -   Difficult doing work/chores Not difficult at all -      Psychosocial Evaluation and Intervention:   Psychosocial Re-Evaluation:   Vocational Rehabilitation: Provide vocational rehab assistance to qualifying candidates.   Vocational Rehab Evaluation & Intervention:     Vocational Rehab - 10/03/16 1604      Initial Vocational Rehab Evaluation & Intervention   Assessment shows need for Vocational Rehabilitation No  Pt does not plan to return to competitive employment.  Pt is retired.      Education: Education Goals: Education classes will be provided on a weekly basis, covering required topics. Participant will state understanding/return demonstration of topics presented.  Learning Barriers/Preferences:     Learning Barriers/Preferences - 10/03/16 2841      Learning Barriers/Preferences   Learning Barriers Sight  readers   Learning Preferences Verbal Instruction;Written Material      Education Topics: Count Your Pulse:  -Group instruction provided by verbal instruction, demonstration, patient participation and written materials to support subject.  Instructors address importance of being able to find your pulse and how to count your pulse when at home without a heart monitor.  Patients get hands on experience counting their pulse with staff help and individually.   Heart Attack, Angina, and Risk Factor Modification:  -Group instruction provided by verbal instruction, video, and written materials to support  subject.  Instructors address signs and symptoms of angina and heart attacks.    Also discuss risk factors for heart disease and how to make changes to improve heart health risk factors.   Functional Fitness:  -Group instruction provided by verbal  instruction, demonstration, patient participation, and written materials to support subject.  Instructors address safety measures for doing things around the house.  Discuss how to get up and down off the floor, how to pick things up properly, how to safely get out of a chair without assistance, and balance training.   Meditation and Mindfulness:  -Group instruction provided by verbal instruction, patient participation, and written materials to support subject.  Instructor addresses importance of mindfulness and meditation practice to help reduce stress and improve awareness.  Instructor also leads participants through a meditation exercise.    Stretching for Flexibility and Mobility:  -Group instruction provided by verbal instruction, patient participation, and written materials to support subject.  Instructors lead participants through series of stretches that are designed to increase flexibility thus improving mobility.  These stretches are additional exercise for major muscle groups that are typically performed during regular warm up and cool down.   Hands Only CPR Anytime:  -Group instruction provided by verbal instruction, video, patient participation and written materials to support subject.  Instructors co-teach with AHA video for hands only CPR.  Participants get hands on experience with mannequins.   Nutrition I class: Heart Healthy Eating:  -Group instruction provided by PowerPoint slides, verbal discussion, and written materials to support subject matter. The instructor gives an explanation and review of the Therapeutic Lifestyle Changes diet recommendations, which includes a discussion on lipid goals, dietary fat, sodium, fiber, plant  stanol/sterol esters, sugar, and the components of a well-balanced, healthy diet.   Nutrition II class: Lifestyle Skills:  -Group instruction provided by PowerPoint slides, verbal discussion, and written materials to support subject matter. The instructor gives an explanation and review of label reading, grocery shopping for heart health, heart healthy recipe modifications, and ways to make healthier choices when eating out.   Diabetes Question & Answer:  -Group instruction provided by PowerPoint slides, verbal discussion, and written materials to support subject matter. The instructor gives an explanation and review of diabetes co-morbidities, pre- and post-prandial blood glucose goals, pre-exercise blood glucose goals, signs, symptoms, and treatment of hypoglycemia and hyperglycemia, and foot care basics.   Diabetes Blitz:  -Group instruction provided by PowerPoint slides, verbal discussion, and written materials to support subject matter. The instructor gives an explanation and review of the physiology behind type 1 and type 2 diabetes, diabetes medications and rational behind using different medications, pre- and post-prandial blood glucose recommendations and Hemoglobin A1c goals, diabetes diet, and exercise including blood glucose guidelines for exercising safely.    Portion Distortion:  -Group instruction provided by PowerPoint slides, verbal discussion, written materials, and food models to support subject matter. The instructor gives an explanation of serving size versus portion size, changes in portions sizes over the last 20 years, and what consists of a serving from each food group.   Stress Management:  -Group instruction provided by verbal instruction, video, and written materials to support subject matter.  Instructors review role of stress in heart disease and how to cope with stress positively.     Exercising on Your Own:  -Group instruction provided by verbal instruction,  power point, and written materials to support subject.  Instructors discuss benefits of exercise, components of exercise, frequency and intensity of exercise, and end points for exercise.  Also discuss use of nitroglycerin and activating EMS.  Review options of places to exercise outside of rehab.  Review guidelines for sex with heart disease.   Cardiac Drugs I:  -Group instruction provided by verbal instruction  and written materials to support subject.  Instructor reviews cardiac drug classes: antiplatelets, anticoagulants, beta blockers, and statins.  Instructor discusses reasons, side effects, and lifestyle considerations for each drug class.   Cardiac Drugs II:  -Group instruction provided by verbal instruction and written materials to support subject.  Instructor reviews cardiac drug classes: angiotensin converting enzyme inhibitors (ACE-I), angiotensin II receptor blockers (ARBs), nitrates, and calcium channel blockers.  Instructor discusses reasons, side effects, and lifestyle considerations for each drug class.   Anatomy and Physiology of the Circulatory System:  -Group instruction provided by verbal instruction, video, and written materials to support subject.  Reviews functional anatomy of heart, how it relates to various diagnoses, and what role the heart plays in the overall system.   Knowledge Questionnaire Score:     Knowledge Questionnaire Score - 10/03/16 1530      Knowledge Questionnaire Score   Pre Score 20/24      Core Components/Risk Factors/Patient Goals at Admission:     Personal Goals and Risk Factors at Admission - 10/03/16 0928      Core Components/Risk Factors/Patient Goals on Admission    Weight Management Yes;Weight Loss   Intervention Weight Management: Develop a combined nutrition and exercise program designed to reach desired caloric intake, while maintaining appropriate intake of nutrient and fiber, sodium and fats, and appropriate energy expenditure  required for the weight goal.;Weight Management: Provide education and appropriate resources to help participant work on and attain dietary goals.;Weight Management/Obesity: Establish reasonable short term and long term weight goals.;Obesity: Provide education and appropriate resources to help participant work on and attain dietary goals.   Expected Outcomes Short Term: Continue to assess and modify interventions until short term weight is achieved;Long Term: Adherence to nutrition and physical activity/exercise program aimed toward attainment of established weight goal;Weight Maintenance: Understanding of the daily nutrition guidelines, which includes 25-35% calories from fat, 7% or less cal from saturated fats, less than '200mg'$  cholesterol, less than 1.5gm of sodium, & 5 or more servings of fruits and vegetables daily;Weight Loss: Understanding of general recommendations for a balanced deficit meal plan, which promotes 1-2 lb weight loss per week and includes a negative energy balance of [phone] kcal/d;Understanding recommendations for meals to include 15-35% energy as protein, 25-35% energy from fat, 35-60% energy from carbohydrates, less than '200mg'$  of dietary cholesterol, 20-35 gm of total fiber daily;Understanding of distribution of calorie intake throughout the day with the consumption of 4-5 meals/snacks   Sedentary Yes   Intervention Provide advice, education, support and counseling about physical activity/exercise needs.;Develop an individualized exercise prescription for aerobic and resistive training based on initial evaluation findings, risk stratification, comorbidities and participant's personal goals.   Expected Outcomes Achievement of increased cardiorespiratory fitness and enhanced flexibility, muscular endurance and strength shown through measurements of functional capacity and personal statement of participant.   Increase Strength and Stamina Yes   Intervention Provide advice, education,  support and counseling about physical activity/exercise needs.;Develop an individualized exercise prescription for aerobic and resistive training based on initial evaluation findings, risk stratification, comorbidities and participant's personal goals.   Expected Outcomes Achievement of increased cardiorespiratory fitness and enhanced flexibility, muscular endurance and strength shown through measurements of functional capacity and personal statement of participant.   Improve shortness of breath with ADL's Yes   Intervention Provide education, individualized exercise plan and daily activity instruction to help decrease symptoms of SOB with activities of daily living.   Expected Outcomes Short Term: Achieves a reduction of symptoms when performing activities  of daily living.   Personal Goal Other Yes   Personal Goal Get back to usual activities without fatigue and lose 5lbs   Intervention Provide nutrition and exercise programming to assist with weightloss and performing ADL's    Expected Outcomes Pt will be able to return to activities with less fatigue and lose 5lbs      Core Components/Risk Factors/Patient Goals Review:    Core Components/Risk Factors/Patient Goals at Discharge (Final Review):    ITP Comments:     ITP Comments    Row Name 09/23/16 0949 10/03/16 0926         ITP Comments I returned Ed's vm that he wanted to attend Cardiac Rehab in [place], [place] at [facility] due to taking his grandson to school in [place]. I called and spoke to Corinna at [facility] and she will contact Baird Lyons Dr. Fransico Him, Medical Director         Comments:  Pt in today for cardiac rehab orientation from 0800-1030 at [facility].  Pt elected to attend CR at [facility] due to bringing a grandchild to school in [place].  Pt did some initial testing at [facility] on 9/21.  The above information dated on 9/21 reflects assessment measurements were completed at [facility].  The above  information dated 10/5 is from [facility].  As a part of of the cardiac rehab orientation appt, Pt completed a 6 minute walk test.  Pt tolerated well and showed improvement from previous testing at [facility] on 9/21. Monitor showed SR with no ectopy noted.  Pt is looking forward to starting full exercise on next Monday. Cherre Huger, BSN

## 2016-10-08 ENCOUNTER — Ambulatory Visit: Payer: PPO

## 2016-10-08 DIAGNOSIS — R32 Unspecified urinary incontinence: Secondary | ICD-10-CM | POA: Diagnosis not present

## 2016-10-08 DIAGNOSIS — E291 Testicular hypofunction: Secondary | ICD-10-CM | POA: Diagnosis not present

## 2016-10-08 DIAGNOSIS — N35011 Post-traumatic bulbous urethral stricture: Secondary | ICD-10-CM | POA: Diagnosis not present

## 2016-10-08 DIAGNOSIS — N401 Enlarged prostate with lower urinary tract symptoms: Secondary | ICD-10-CM | POA: Diagnosis not present

## 2016-10-08 DIAGNOSIS — N5201 Erectile dysfunction due to arterial insufficiency: Secondary | ICD-10-CM | POA: Diagnosis not present

## 2016-10-10 ENCOUNTER — Encounter (HOSPITAL_COMMUNITY): Payer: PPO

## 2016-10-10 ENCOUNTER — Ambulatory Visit: Payer: PPO

## 2016-10-10 ENCOUNTER — Ambulatory Visit: Payer: PPO | Admitting: Cardiothoracic Surgery

## 2016-10-10 ENCOUNTER — Encounter (HOSPITAL_COMMUNITY): Payer: Self-pay | Admitting: *Deleted

## 2016-10-10 DIAGNOSIS — Z951 Presence of aortocoronary bypass graft: Secondary | ICD-10-CM

## 2016-10-10 NOTE — Progress Notes (Signed)
Cardiac Individual Treatment Plan  Patient Details  Name: [name] [name] MRN: [id number] Date of Birth: [birth year] Referring Provider:   Flowsheet Row CARDIAC REHAB PHASE II ORIENTATION from 10/03/2016 in [place]  Referring Provider  Loralie Champagne MD      Initial Encounter Date:  [place] PHASE II ORIENTATION from 10/03/2016 in [place]  Date  10/03/16  Referring Provider  Loralie Champagne MD      Visit Diagnosis: 07/19/16 S/P CABG x 4  Patient's Home Medications on Admission:  Current Outpatient Prescriptions:  .  acetaminophen (TYLENOL) 325 MG tablet, Take 2 tablets (650 mg total) by mouth every 6 (six) hours as needed for mild pain., Disp: , Rfl:  .  Ascorbic Acid (VITAMIN C) 1000 MG tablet, Take 1,000 mg by mouth daily., Disp: , Rfl:  .  aspirin 81 MG tablet, Take 81 mg by mouth daily., Disp: , Rfl:  .  b complex vitamins tablet, Take 1 tablet by mouth daily., Disp: , Rfl:  .  carvedilol (COREG) 6.25 MG tablet, Take 1 tablet (6.25 mg total) by mouth 2 (two) times daily., Disp: 60 tablet, Rfl: 3 .  digoxin (LANOXIN) 0.125 MG tablet, Take 0.0625 mg by mouth daily., Disp: , Rfl:  .  ezetimibe (ZETIA) 10 MG tablet, Take 10 mg by mouth daily., Disp: , Rfl:  .  losartan (COZAAR) 25 MG tablet, Take 0.5 tablets (12.5 mg total) by mouth daily., Disp: 90 tablet, Rfl: 3 .  Multiple Vitamin (MULTIVITAMIN WITH MINERALS) TABS tablet, Take 1 tablet by mouth daily., Disp: , Rfl:  .  Omega 3 1200 MG CAPS, Take 1 capsule by mouth 2 (two) times daily. , Disp: , Rfl:  .  pyridoxine (B-6) 100 MG tablet, Take 100 mg by mouth daily., Disp: , Rfl:  .  spironolactone (ALDACTONE) 25 MG tablet, Take 1 tablet (25 mg total) by mouth daily., Disp: 30 tablet, Rfl: 3  Past Medical History: Past Medical History:  Diagnosis Date  . Arthritis    JOINT PAIN RIGHT HAND  . BPH (benign prostatic hyperplasia)     [name]/elevated PSA, prostate biopsy x4 including one saturation biopsy, laser treatment 2/14; NOCTURIA  . CAD (coronary artery disease) 2013   cath 05/2012 showing 80-90% stenosis of a trifurcating diagonal #1, 70-80% stenosis of OM3 and 90% stenosis of distal LCx after OM3 - medical management, [place]  . Cardiomyopathy ([place])    dilated cardiomyopathy EF 30%, MUGA  EF 42% 08/2012  . Carotid artery occlusion    carotid artery bruit  . Chronic kidney disease    kidney stones -small passed.  . Diastolic dysfunction   . Heart murmur    as a child  . History of shingles   . Hyperlipidemia    statin intolerant  . Hypertension   . Lung mass    Stage 1B non-small cell lung CA s/p resection 2013  . PVC (premature ventricular contraction)   . Shingles   . Sigmoid diverticulosis   . Skin cancer    Multiple skin cancers   . Squamous cell carcinoma, face    history of right face with mets to right upper cheek in 2004  and facial lymph node reoccurence post surgery with XRT  . Trigger finger    Bilateral    Tobacco Use: History  Smoking Status  . Former Smoker  . Types: Cigarettes  . Quit date: 12/30/1965  Smokeless Tobacco  . Never  Used    Labs: Recent Chemical engineer    Labs for ITP Cardiac and Pulmonary Rehab Latest Ref Rng & Units 07/19/2016 07/19/2016 07/20/2016 07/22/2016 09/06/2016   Cholestrol 0 - 200 mg/dL - - - - 174   LDLCALC 0 - 99 mg/dL - - - - 110(H)   LDLDIRECT mg/dL - - - - -   HDL >40 mg/dL - - - - 46   Trlycerides <150 mg/dL - - - - 91   Hemoglobin A1c 4.8 - 5.6 % - - - - -   PHART 7.350 - 7.450 - 7.318(L) - - -   PCO2ART 35.0 - 45.0 mmHg - 45.0 - - -   HCO3 20.0 - 24.0 mEq/L - 23.0 - - -   TCO2 0 - 100 mmol/L '23 24 20 '$ - -   ACIDBASEDEF 0.0 - 2.0 mmol/L - 3.0(H) - - -   O2SAT % - 98.0 - 59.3 -      Capillary Blood Glucose: Lab Results  Component Value Date   GLUCAP 115 (H) 07/24/2016   GLUCAP 153 (H) 07/23/2016   GLUCAP 116 (H) 07/23/2016   GLUCAP  179 (H) 07/23/2016   GLUCAP 112 (H) 07/23/2016     Exercise Target Goals:    Exercise Program Goal: Individual exercise prescription set with THRR, safety & activity barriers. Participant demonstrates ability to understand and report RPE using BORG scale, to self-measure pulse accurately, and to acknowledge the importance of the exercise prescription.  Exercise Prescription Goal: Starting with aerobic activity 30 plus minutes a day, 3 days per week for initial exercise prescription. Provide home exercise prescription and guidelines that participant acknowledges understanding prior to discharge.  Activity Barriers & Risk Stratification:     Activity Barriers & Cardiac Risk Stratification - 10/03/16 0928      Activity Barriers & Cardiac Risk Stratification   Activity Barriers Deconditioning;Muscular Weakness;Balance Concerns;Shortness of Breath  Neuropathy in R hand   Cardiac Risk Stratification High      6 Minute Walk:     6 Minute Walk    Row Name 09/19/16 1502 10/03/16 1529       6 Minute Walk   Phase Initial Initial    Distance 1290 feet 1364 feet    Walk Time 6 minutes 6 minutes    # of Rest Breaks 0 0    MPH 2.44 2.58    METS 2.46 2.51    RPE 7 11    Perceived Dyspnea  2  -    VO2 Peak 8.61 8.79    Symptoms Yes (comment) No    Comments slightly short of breath  -    Resting HR 64 bpm 70 bpm    Resting BP 110/60 102/60    Max Ex. HR 113 bpm 104 bpm    Max Ex. BP 124/70 128/60    2 Minute Post BP 116/64 108/56       Initial Exercise Prescription:     Initial Exercise Prescription - 10/03/16 1500      Date of Initial Exercise RX and Referring Provider   Date 10/03/16   Referring Provider Loralie Champagne MD     Treadmill   MPH 2   Grade 0   Minutes 10   METs 2.53     NuStep   Level 3   Watts --   Minutes 10   METs 2     Cybex   Level 2  upright scifit   Minutes 10  METs 2     REL-XR   Level --   Watts --   Minutes --   METs --      Prescription Details   Frequency (times per week) 2     Intensity   THRR 40-80% of Max Heartrate 95-126   Ratings of Perceived Exertion 11-15   Perceived Dyspnea 0-4     Progression   Progression Continue to progress workloads to maintain intensity without signs/symptoms of physical distress.     Resistance Training   Training Prescription Yes   Weight 2lbs   Reps 10-12      Perform Capillary Blood Glucose checks as needed.  Exercise Prescription Changes:     Exercise Prescription Changes    Row Name 09/19/16 1500             Exercise Review   Progression -  walk test results         Response to Exercise   Blood Pressure (Admit) 110/60       Blood Pressure (Exercise) 124/70       Blood Pressure (Exit) 116/64       Heart Rate (Admit) 64 bpm       Heart Rate (Exercise) 113 bpm       Heart Rate (Exit) 73 bpm       Oxygen Saturation (Admit) 97 %       Oxygen Saturation (Exit) 98 %       Rating of Perceived Exertion (Exercise) 7       Perceived Dyspnea (Exercise) 2       Symptoms slight SOB          Exercise Comments:     Exercise Comments    Row Name 09/19/16 1506 10/09/16 1427         Exercise Comments Main goal is to increase strength and stamina to regain lifestyle and not fatigue so easily. Patient will start exercise on 10/16/16.         Discharge Exercise Prescription (Final Exercise Prescription Changes):     Exercise Prescription Changes - 09/19/16 1500      Exercise Review   Progression --  walk test results     Response to Exercise   Blood Pressure (Admit) 110/60   Blood Pressure (Exercise) 124/70   Blood Pressure (Exit) 116/64   Heart Rate (Admit) 64 bpm   Heart Rate (Exercise) 113 bpm   Heart Rate (Exit) 73 bpm   Oxygen Saturation (Admit) 97 %   Oxygen Saturation (Exit) 98 %   Rating of Perceived Exertion (Exercise) 7   Perceived Dyspnea (Exercise) 2   Symptoms slight SOB      Nutrition:  Target Goals: Understanding of  nutrition guidelines, daily intake of sodium '1500mg'$ , cholesterol '200mg'$ , calories 30% from fat and 7% or less from saturated fats, daily to have 5 or more servings of fruits and vegetables.  Biometrics:     Pre Biometrics - 10/03/16 1521      Pre Biometrics   Weight 155 lb 6.8 oz (70.5 kg)   Hip Circumference 39.5 inches   Grip Strength 34 kg   Flexibility 0 in   Single Leg Stand 30 seconds       Nutrition Therapy Plan and Nutrition Goals:     Nutrition Therapy & Goals - 09/19/16 1501      Nutrition Therapy   Drug/Food Interactions Statins/Certain Fruits     Intervention Plan   Intervention Prescribe, educate and counsel regarding individualized specific dietary  modifications aiming towards targeted core components such as weight, hypertension, lipid management, diabetes, heart failure and other comorbidities.;Nutrition handout(s) given to patient.   Expected Outcomes Short Term Goal: Understand basic principles of dietary content, such as calories, fat, sodium, cholesterol and nutrients.;Short Term Goal: A plan has been developed with personal nutrition goals set during dietitian appointment.;Long Term Goal: Adherence to prescribed nutrition plan.      Nutrition Discharge: Nutrition Scores:     Nutrition Assessments - 09/19/16 1504      Rate Your Plate Scores   Pre Score 66   Pre Score % 73.3 %      Nutrition Goals Re-Evaluation:   Psychosocial: Target Goals: Acknowledge presence or absence of depression, maximize coping skills, provide positive support system. Participant is able to verbalize types and ability to use techniques and skills needed for reducing stress and depression.  Initial Review & Psychosocial Screening:     Initial Psych Review & Screening - 10/03/16 [place]? Yes      Quality of Life Scores:     Quality of Life - 10/03/16 1518      Quality of Life Scores   Health/Function Pre 24.47 %    Socioeconomic Pre 27.5 %   Psych/Spiritual Pre 30 %   Family Pre 26.4 %   GLOBAL Pre 26.51 %      PHQ-9: Recent Review Flowsheet Data    Depression screen [facility] 2/9 09/19/2016 08/07/2016   Decreased Interest 0 0   Down, Depressed, Hopeless 0 0   PHQ - 2 Score 0 0   Altered sleeping 1 -   Tired, decreased energy 1 -   Change in appetite 0 -   Feeling bad or failure about yourself  0 -   Trouble concentrating 0 -   Moving slowly or fidgety/restless 1 -   Suicidal thoughts 0 -   PHQ-9 Score 3 -   Difficult doing work/chores Not difficult at all -      Psychosocial Evaluation and Intervention:   Psychosocial Re-Evaluation:   Vocational Rehabilitation: Provide vocational rehab assistance to qualifying candidates.   Vocational Rehab Evaluation & Intervention:     Vocational Rehab - 10/03/16 1604      Initial Vocational Rehab Evaluation & Intervention   Assessment shows need for Vocational Rehabilitation No  Pt does not plan to return to competitive employment.  Pt is retired.      Education: Education Goals: Education classes will be provided on a weekly basis, covering required topics. Participant will state understanding/return demonstration of topics presented.  Learning Barriers/Preferences:     Learning Barriers/Preferences - 10/03/16 5009      Learning Barriers/Preferences   Learning Barriers Sight  readers   Learning Preferences Verbal Instruction;Written Material      Education Topics: Count Your Pulse:  -Group instruction provided by verbal instruction, demonstration, patient participation and written materials to support subject.  Instructors address importance of being able to find your pulse and how to count your pulse when at home without a heart monitor.  Patients get hands on experience counting their pulse with staff help and individually.   Heart Attack, Angina, and Risk Factor Modification:  -Group instruction provided by verbal instruction,  video, and written materials to support subject.  Instructors address signs and symptoms of angina and heart attacks.    Also discuss risk factors for heart disease and how to make changes to improve heart health risk  factors.   Functional Fitness:  -Group instruction provided by verbal instruction, demonstration, patient participation, and written materials to support subject.  Instructors address safety measures for doing things around the house.  Discuss how to get up and down off the floor, how to pick things up properly, how to safely get out of a chair without assistance, and balance training.   Meditation and Mindfulness:  -Group instruction provided by verbal instruction, patient participation, and written materials to support subject.  Instructor addresses importance of mindfulness and meditation practice to help reduce stress and improve awareness.  Instructor also leads participants through a meditation exercise.    Stretching for Flexibility and Mobility:  -Group instruction provided by verbal instruction, patient participation, and written materials to support subject.  Instructors lead participants through series of stretches that are designed to increase flexibility thus improving mobility.  These stretches are additional exercise for major muscle groups that are typically performed during regular warm up and cool down.   Hands Only CPR Anytime:  -Group instruction provided by verbal instruction, video, patient participation and written materials to support subject.  Instructors co-teach with AHA video for hands only CPR.  Participants get hands on experience with mannequins.   Nutrition I class: Heart Healthy Eating:  -Group instruction provided by PowerPoint slides, verbal discussion, and written materials to support subject matter. The instructor gives an explanation and review of the Therapeutic Lifestyle Changes diet recommendations, which includes a discussion on lipid goals,  dietary fat, sodium, fiber, plant stanol/sterol esters, sugar, and the components of a well-balanced, healthy diet.   Nutrition II class: Lifestyle Skills:  -Group instruction provided by PowerPoint slides, verbal discussion, and written materials to support subject matter. The instructor gives an explanation and review of label reading, grocery shopping for heart health, heart healthy recipe modifications, and ways to make healthier choices when eating out.   Diabetes Question & Answer:  -Group instruction provided by PowerPoint slides, verbal discussion, and written materials to support subject matter. The instructor gives an explanation and review of diabetes co-morbidities, pre- and post-prandial blood glucose goals, pre-exercise blood glucose goals, signs, symptoms, and treatment of hypoglycemia and hyperglycemia, and foot care basics.   Diabetes Blitz:  -Group instruction provided by PowerPoint slides, verbal discussion, and written materials to support subject matter. The instructor gives an explanation and review of the physiology behind type 1 and type 2 diabetes, diabetes medications and rational behind using different medications, pre- and post-prandial blood glucose recommendations and Hemoglobin A1c goals, diabetes diet, and exercise including blood glucose guidelines for exercising safely.    Portion Distortion:  -Group instruction provided by PowerPoint slides, verbal discussion, written materials, and food models to support subject matter. The instructor gives an explanation of serving size versus portion size, changes in portions sizes over the last 20 years, and what consists of a serving from each food group.   Stress Management:  -Group instruction provided by verbal instruction, video, and written materials to support subject matter.  Instructors review role of stress in heart disease and how to cope with stress positively.     Exercising on Your Own:  -Group instruction  provided by verbal instruction, power point, and written materials to support subject.  Instructors discuss benefits of exercise, components of exercise, frequency and intensity of exercise, and end points for exercise.  Also discuss use of nitroglycerin and activating EMS.  Review options of places to exercise outside of rehab.  Review guidelines for sex with heart disease.  Cardiac Drugs I:  -Group instruction provided by verbal instruction and written materials to support subject.  Instructor reviews cardiac drug classes: antiplatelets, anticoagulants, beta blockers, and statins.  Instructor discusses reasons, side effects, and lifestyle considerations for each drug class.   Cardiac Drugs II:  -Group instruction provided by verbal instruction and written materials to support subject.  Instructor reviews cardiac drug classes: angiotensin converting enzyme inhibitors (ACE-I), angiotensin II receptor blockers (ARBs), nitrates, and calcium channel blockers.  Instructor discusses reasons, side effects, and lifestyle considerations for each drug class.   Anatomy and Physiology of the Circulatory System:  -Group instruction provided by verbal instruction, video, and written materials to support subject.  Reviews functional anatomy of heart, how it relates to various diagnoses, and what role the heart plays in the overall system.   Knowledge Questionnaire Score:     Knowledge Questionnaire Score - 10/03/16 1530      Knowledge Questionnaire Score   Pre Score 20/24      Core Components/Risk Factors/Patient Goals at Admission:     Personal Goals and Risk Factors at Admission - 10/03/16 0928      Core Components/Risk Factors/Patient Goals on Admission    Weight Management Yes;Weight Loss   Intervention Weight Management: Develop a combined nutrition and exercise program designed to reach desired caloric intake, while maintaining appropriate intake of nutrient and fiber, sodium and fats, and  appropriate energy expenditure required for the weight goal.;Weight Management: Provide education and appropriate resources to help participant work on and attain dietary goals.;Weight Management/Obesity: Establish reasonable short term and long term weight goals.;Obesity: Provide education and appropriate resources to help participant work on and attain dietary goals.   Expected Outcomes Short Term: Continue to assess and modify interventions until short term weight is achieved;Long Term: Adherence to nutrition and physical activity/exercise program aimed toward attainment of established weight goal;Weight Maintenance: Understanding of the daily nutrition guidelines, which includes 25-35% calories from fat, 7% or less cal from saturated fats, less than '200mg'$  cholesterol, less than 1.5gm of sodium, & 5 or more servings of fruits and vegetables daily;Weight Loss: Understanding of general recommendations for a balanced deficit meal plan, which promotes 1-2 lb weight loss per week and includes a negative energy balance of [id number] kcal/d;Understanding recommendations for meals to include 15-35% energy as protein, 25-35% energy from fat, 35-60% energy from carbohydrates, less than '200mg'$  of dietary cholesterol, 20-35 gm of total fiber daily;Understanding of distribution of calorie intake throughout the day with the consumption of 4-5 meals/snacks   Sedentary Yes   Intervention Provide advice, education, support and counseling about physical activity/exercise needs.;Develop an individualized exercise prescription for aerobic and resistive training based on initial evaluation findings, risk stratification, comorbidities and participant's personal goals.   Expected Outcomes Achievement of increased cardiorespiratory fitness and enhanced flexibility, muscular endurance and strength shown through measurements of functional capacity and personal statement of participant.   Increase Strength and Stamina Yes   Intervention  Provide advice, education, support and counseling about physical activity/exercise needs.;Develop an individualized exercise prescription for aerobic and resistive training based on initial evaluation findings, risk stratification, comorbidities and participant's personal goals.   Expected Outcomes Achievement of increased cardiorespiratory fitness and enhanced flexibility, muscular endurance and strength shown through measurements of functional capacity and personal statement of participant.   Improve shortness of breath with ADL's Yes   Intervention Provide education, individualized exercise plan and daily activity instruction to help decrease symptoms of SOB with activities of daily living.   Expected Outcomes  Short Term: Achieves a reduction of symptoms when performing activities of daily living.   Personal Goal Other Yes   Personal Goal Get back to usual activities without fatigue and lose 5lbs   Intervention Provide nutrition and exercise programming to assist with weightloss and performing ADL's    Expected Outcomes Pt will be able to return to activities with less fatigue and lose 5lbs      Core Components/Risk Factors/Patient Goals Review:    Core Components/Risk Factors/Patient Goals at Discharge (Final Review):    ITP Comments:     ITP Comments    Row Name 09/23/16 0949 10/03/16 0926         ITP Comments I returned Ed's vm that he wanted to attend Cardiac Rehab in [place], [place] at [place] due to taking his grandson to school in [place]. I called and spoke to [place] at [facility] and she will contact Baird Lyons Dr. Fransico Him, Medical Director         Comments: Mr Heinze attended orientation and completed his walk test and plans to begin exercise on 10/16/2016.Barnet Pall, RN,BSN 10/10/2016 4:49 PM

## 2016-10-14 ENCOUNTER — Encounter (HOSPITAL_COMMUNITY): Payer: Self-pay

## 2016-10-14 ENCOUNTER — Encounter (HOSPITAL_COMMUNITY): Payer: PPO

## 2016-10-15 ENCOUNTER — Ambulatory Visit: Payer: PPO

## 2016-10-16 ENCOUNTER — Encounter (HOSPITAL_COMMUNITY)
Admission: RE | Admit: 2016-10-16 | Discharge: 2016-10-16 | Disposition: A | Discharge status: Home / Self Care | Payer: PPO | Source: Ambulatory Visit | Attending: Cardiology | Admitting: Cardiology

## 2016-10-16 DIAGNOSIS — Z951 Presence of aortocoronary bypass graft: Secondary | ICD-10-CM

## 2016-10-16 NOTE — Progress Notes (Signed)
Daily Session Note  Patient Details  Name: ERMON SAGAN MRN: [id number] Date of Birth: 14-May-1936 Referring Provider:   Flowsheet Row CARDIAC REHAB PHASE II ORIENTATION from 10/03/2016 in [place]  Referring Provider  Loralie Champagne MD      Encounter Date: 10/16/2016  Check In:   Capillary Blood Glucose: No results found for this or any previous visit (from the past 24 hour(s)).   Goals Met:  Exercise tolerated well  Goals Unmet:  Not Applicable  Comments: Pt started cardiac rehab today.  Pt tolerated light exercise without difficulty. VSS, telemetry-Sinus rhythm, asymptomatic.  Medication list reconciled. Pt denies barriers to medicaiton compliance.  PSYCHOSOCIAL ASSESSMENT:  PHQ-0. Pt exhibits positive coping skills, hopeful outlook with supportive family. No psychosocial needs identified at this time, no psychosocial interventions necessary.    Pt enjoys working around the house.   Pt oriented to exercise equipment and routine.    Understanding verbalized.Barnet Pall, RN,BSN 10/17/2016 10:34 AM   Dr. Fransico Him is Medical Director for Cardiac Rehab at [facility].

## 2016-10-17 ENCOUNTER — Ambulatory Visit (INDEPENDENT_AMBULATORY_CARE_PROVIDER_SITE_OTHER): Payer: Self-pay | Admitting: Cardiothoracic Surgery

## 2016-10-17 ENCOUNTER — Encounter: Payer: Self-pay | Admitting: Cardiothoracic Surgery

## 2016-10-17 ENCOUNTER — Ambulatory Visit: Payer: PPO

## 2016-10-17 VITALS — BP 109/67 | HR 66 | Resp 16 | Ht 64.0 in | Wt 154.0 lb

## 2016-10-17 DIAGNOSIS — C44329 Squamous cell carcinoma of skin of other parts of face: Secondary | ICD-10-CM | POA: Diagnosis not present

## 2016-10-17 DIAGNOSIS — Z85118 Personal history of other malignant neoplasm of bronchus and lung: Secondary | ICD-10-CM

## 2016-10-17 DIAGNOSIS — Z85828 Personal history of other malignant neoplasm of skin: Secondary | ICD-10-CM | POA: Diagnosis not present

## 2016-10-17 DIAGNOSIS — Z902 Acquired absence of lung [part of]: Secondary | ICD-10-CM

## 2016-10-17 DIAGNOSIS — I25110 Atherosclerotic heart disease of native coronary artery with unstable angina pectoris: Secondary | ICD-10-CM

## 2016-10-17 DIAGNOSIS — L988 Other specified disorders of the skin and subcutaneous tissue: Secondary | ICD-10-CM | POA: Diagnosis not present

## 2016-10-17 DIAGNOSIS — Z951 Presence of aortocoronary bypass graft: Secondary | ICD-10-CM

## 2016-10-17 NOTE — Progress Notes (Signed)
[address]       [place] [address]             [id number]      T[place] H Gianfrancesco [place] Medical Record #[id number] Date of Birth: Oct 09, 1936  Referring: Corey Skains, MD Primary Care: Irven Shelling, MD  Chief Complaint:   POST OP FOLLOW UP 07/19/2016  OPERATIVE REPORT PREOPERATIVE DIAGNOSIS:  Three-vessel coronary artery disease with ischemic cardiomyopathy, ejection fraction 20%. POSTOPERATIVE DIAGNOSIS:  Three-vessel coronary artery disease with ischemic cardiomyopathy, ejection fraction 20%. Nonunion of the right sternoclavicular joint. PROCEDURE:  Coronary artery bypass grafting x4 with left internal mammary to the left anterior descending coronary artery, reverse saphenous vein graft to the diagonal coronary artery, reverse saphenous vein graft to the circumflex coronary artery, reverse saphenous vein graft to the posterior descending coronary artery with right thigh and calf greater saphenous endoscopic vein harvesting. Plating of right sternoclavicular joint SURGEON:  Lanelle Bal, MD.  06/19/2012  OPERATIVE REPORT  PREOPERATIVE DIAGNOSIS: Left upper lobe lung mass.  POSTOPERATIVE DIAGNOSES: Left upper lobe lung mass, squamous cell  carcinoma, non-small cell carcinoma by frozen section.  PROCEDURE PERFORMED: Video bronchoscopy, left video-assisted  thoracoscopy with mini thoracotomy, resection of left upper lobe and  portion of superior segment left lower lobe and lymph node dissection.   Stage IB, (pT2a,pN0,cM0 SQUAMOUS CELL CARCINOMA, 3.8 CM.)   History of Present Illness:      Patient making good progress following  artery bypass grafting in late July. He's also been followed for the past 4 years with a stage IB squamous cell are [facility] of the lung following left upper lobectomy. At the time of surgery he was noted to have significantly decreased LV function with ejection fraction approximately 15%. He seems to be  making steady progress in his physical activity, does not have any overt symptoms of congestive heart failure. [facility]. He is unaware of any episodes of atrial fibrillation. He's continues to have some numbness in his right index and middle finger, it's unclear if this is brachial stretch but he notes that it is improving.   Past Medical History:  Diagnosis Date  . Arthritis    JOINT PAIN RIGHT HAND  . BPH (benign prostatic hyperplasia)    [name]/elevated PSA, prostate biopsy x4 including one saturation biopsy, laser treatment 2/14; NOCTURIA  . CAD (coronary artery disease) 2013   cath 05/2012 showing 80-90% stenosis of a trifurcating diagonal #1, 70-80% stenosis of OM3 and 90% stenosis of distal LCx after OM3 - medical management, [facility]  . Cardiomyopathy ([place])    dilated cardiomyopathy EF 30%, MUGA  EF 42% 08/2012  . Carotid artery occlusion    carotid artery bruit  . Chronic kidney disease    kidney stones -small passed.  . Diastolic dysfunction   . Heart murmur    as a child  . History of shingles   . Hyperlipidemia    statin intolerant  . Hypertension   . Lung mass    Stage 1B non-small cell lung CA s/p resection 2013  . PVC (premature ventricular contraction)   . Shingles   . Sigmoid diverticulosis   . Skin cancer    Multiple skin cancers   . Squamous cell carcinoma, face    history of right face with mets to right upper cheek in 2004  and facial lymph node reoccurence post surgery with XRT  . Trigger finger    Bilateral  History  Smoking Status  . Former Smoker  . Types: Cigarettes  . Quit date: 12/30/1965  Smokeless Tobacco  . Never Used    History  Alcohol Use  . 3.0 - 3.6 oz/week  . 3 - 4 Glasses of wine, 2 Cans of beer per week    Comment: infrequently- "indulge heavly when I do"     Allergies  Allergen Reactions  . Ramipril Cough  . Statins     Confusion Muscle pain     Current Outpatient  Prescriptions  Medication Sig Dispense Refill  . acetaminophen (TYLENOL) 325 MG tablet Take 2 tablets (650 mg total) by mouth every 6 (six) hours as needed for mild pain.    . Ascorbic Acid (VITAMIN C) 1000 MG tablet Take 1,000 mg by mouth daily.    Marland Kitchen aspirin 81 MG tablet Take 81 mg by mouth daily.    Marland Kitchen b complex vitamins tablet Take 1 tablet by mouth daily.    . carvedilol (COREG) 6.25 MG tablet Take 1 tablet (6.25 mg total) by mouth 2 (two) times daily. 60 tablet 3  . digoxin (LANOXIN) 0.125 MG tablet Take 0.0625 mg by mouth daily.    Marland Kitchen ezetimibe (ZETIA) 10 MG tablet Take 10 mg by mouth daily.    Marland Kitchen losartan (COZAAR) 25 MG tablet Take 0.5 tablets (12.5 mg total) by mouth daily. 90 tablet 3  . Multiple Vitamin (MULTIVITAMIN WITH MINERALS) TABS tablet Take 1 tablet by mouth daily.    . Omega 3 1200 MG CAPS Take 1 capsule by mouth 2 (two) times daily.     Marland Kitchen pyridoxine (B-6) 100 MG tablet Take 100 mg by mouth daily.    Marland Kitchen spironolactone (ALDACTONE) 25 MG tablet Take 1 tablet (25 mg total) by mouth daily. 30 tablet 3   No current facility-administered medications for this visit.        Physical Exam: BP 109/67   Pulse 66   Resp 16   Ht '5\' 4"'$  (1.626 m)   Wt 154 lb (69.9 kg)   SpO2 98% Comment: ON RA  BMI 26.43 kg/m   General appearance: alert and cooperative Neurologic: intact Heart: regular rate and rhythm, S1, S2 normal, no murmur, click, rub or gallop Lungs: clear to auscultation bilaterally Abdomen: soft, non-tender; bowel sounds normal; no masses,  no organomegaly Extremities: extremities normal, atraumatic, no cyanosis or edema Wound: Sternum is stable and well healed, right leg vein harvest site is also well-healed   Diagnostic Studies & Laboratory data:     Recent Radiology Findings:   No results found.    Recent Lab Findings: Lab Results  Component Value Date   WBC 8.1 07/27/2016   HGB 8.6 (L) 07/27/2016   HCT 26.7 (L) 07/27/2016   PLT 305 07/27/2016   GLUCOSE  130 (H) 09/06/2016   CHOL 174 09/06/2016   TRIG 91 09/06/2016   HDL 46 09/06/2016   LDLDIRECT 147.6 11/08/2013   LDLCALC 110 (H) 09/06/2016   ALT 39 07/26/2016   AST 31 07/26/2016   NA 139 09/06/2016   K 4.7 09/06/2016   CL 107 09/06/2016   CREATININE 1.03 09/06/2016   BUN 20 09/06/2016   CO2 26 09/06/2016   TSH 1.645 07/22/2016   INR 1.50 (H) 07/19/2016   HGBA1C 5.8 (H) 07/17/2016      Assessment / Plan:     Patient doing well postoperatively, he has cardiology follow-up in heart failure clinic early next week and a plan for repeat echocardiogram for  LV function assessment approximate 3 months postop I plan to see him back in 3 months with a PA and lateral chest x-ray, at that time will arrange for further follow-up concerning his previously resected lung cancer.       Grace Isaac MD      [address] [place] [address] Office [phone]   Beeper [phone]  10/18/2016 6:34 AM

## 2016-10-18 ENCOUNTER — Encounter (HOSPITAL_COMMUNITY)
Admission: RE | Admit: 2016-10-18 | Discharge: 2016-10-18 | Disposition: A | Discharge status: Home / Self Care | Payer: PPO | Source: Ambulatory Visit | Attending: Cardiology | Admitting: Cardiology

## 2016-10-18 DIAGNOSIS — Z951 Presence of aortocoronary bypass graft: Secondary | ICD-10-CM

## 2016-10-21 ENCOUNTER — Ambulatory Visit (HOSPITAL_COMMUNITY)
Admission: RE | Admit: 2016-10-21 | Discharge: 2016-10-21 | Disposition: A | Discharge status: Home / Self Care | Payer: PPO | Source: Ambulatory Visit | Attending: Cardiology | Admitting: Cardiology

## 2016-10-21 ENCOUNTER — Telehealth (HOSPITAL_COMMUNITY): Payer: Self-pay | Admitting: Vascular Surgery

## 2016-10-21 ENCOUNTER — Encounter (HOSPITAL_COMMUNITY)
Admission: RE | Admit: 2016-10-21 | Discharge: 2016-10-21 | Disposition: A | Discharge status: Home / Self Care | Payer: PPO | Source: Ambulatory Visit | Attending: Cardiology | Admitting: Cardiology

## 2016-10-21 ENCOUNTER — Encounter (HOSPITAL_COMMUNITY): Payer: Self-pay

## 2016-10-21 VITALS — BP 96/60 | HR 58 | Wt 158.0 lb

## 2016-10-21 DIAGNOSIS — Z951 Presence of aortocoronary bypass graft: Secondary | ICD-10-CM

## 2016-10-21 DIAGNOSIS — I519 Heart disease, unspecified: Secondary | ICD-10-CM | POA: Insufficient documentation

## 2016-10-21 DIAGNOSIS — I5022 Chronic systolic (congestive) heart failure: Secondary | ICD-10-CM | POA: Diagnosis not present

## 2016-10-21 DIAGNOSIS — E785 Hyperlipidemia, unspecified: Secondary | ICD-10-CM

## 2016-10-21 DIAGNOSIS — I5189 Other ill-defined heart diseases: Secondary | ICD-10-CM

## 2016-10-21 MED ORDER — LOSARTAN POTASSIUM 25 MG PO TABS: 25.0000 mg | ORAL_TABLET | Freq: Every day | ORAL | 3 refills | Status: DC

## 2016-10-21 NOTE — Patient Instructions (Signed)
Increase Losartan to 25 mg every evening  Labs in 2 weeks  Your physician has requested that you have an echocardiogram. Echocardiography is a painless test that uses sound waves to create images of your heart. It provides your doctor with information about the size and shape of your heart and how well your heart's chambers and valves are working. This procedure takes approximately one hour. There are no restrictions for this procedure.  You have been referred to [facility]  Your physician recommends that you schedule a follow-up appointment in: 2 month

## 2016-10-21 NOTE — Progress Notes (Signed)
PCP: Dr Lavone Orn HF Cardiology: Dr. Aundra Dubin Cardiac surgery: Dr. Servando Snare  80 yo with history of CAD s/p CABG, ischemic cardiomyopathy, and lung cancer s/p resection in 2013 presents for cardiology followup after recent hospitalization for CABG.  He was followed in the past by Dr Radford Pax.  EF was noted to be 35% in 9/16 by cMRI.  He had a high risk stress test in 6/17 that was followed by cath showing 3 VD.  He had CABG x 4 on 07/19/16.  TEE at the time of CABG showed EF 15%.    Post-op, he was initially hypotensive but this resolved.  He was markedly volume overloaded and was diuresed extensively.  He went home from the hospital on Lasix 60 mg po bid and weight continued to drop.  Lasix was stopped with rise in creatinine. Weight has stayed stable.  He is feeling good overall. No significant dyspnea.  He is doing cardiac rehab.  No lightheadedness or syncope.  He still has some numbness in the first and 2nd fingers of his right hand, this has been present since the surgery.  No orthopnea/PND.  Occasional LH with standing/walking, no falls.   Labs (07/27/16): K 4.1, creatinine 1.14 Labs (08/08/16): K 5.4, creatinine 1.72 Labs (08/12/16): K 4.8 => 4.3, creatinine 1.12 => 1.71, digoxin 1.6 => 0.3 Labs (9/17): LDL 110, HDL 46, digoxin < 0.2, K 4.7, creatinine 1.03, BNP 142  PMH: 1. CAD: CABG (07/19/16) with LIMA-LAD, SVG-D, SVG-OM, SVG-PDA following high risk stress Cardiolite.  2. Chronic systolic CHF: Ischemic cardiomyopathy.  - Cardiac MRI (9/16) with EF 35%, normal RV.  - Cardiolite (6/17) with EF 21%.  - TEE (7/17, in OR) with EF 15%.  3. H/o non-small cell lung CA, s/p resection in 2013. 4. BPH 5. PVCs 6. Atrial fibrillation: Noted only post-op in 7/17.  7. Skin cancers 8. Hyperlipidemia: Muscle pain and confusion with statin use.   Social History   Social History  . Marital status: Married    Spouse name: N/A  . Number of children: N/A  . Years of education: N/A   Occupational  History  . Not on file.   Social History Main Topics  . Smoking status: Former Smoker    Types: Cigarettes    Quit date: 12/30/1965  . Smokeless tobacco: Never Used  . Alcohol use 3.0 - 3.6 oz/week    3 - 4 Glasses of wine, 2 Cans of beer per week     Comment: infrequently- "indulge heavly when I do"  . Drug use: No  . Sexual activity: Not on file   Other Topics Concern  . Not on file   Social History Narrative  . No narrative on file   Family History  Problem Relation Age of Onset  . Diabetes Father   . Psoriasis Child   . Heart disease Child     resuscitated from cardiac arrest from Brugada's syndrome  . Lung cancer Mother   . Heart disease Mother    ROS: All systems reviewed and negative except as per HPI.   Current Outpatient Prescriptions  Medication Sig Dispense Refill  . acetaminophen (TYLENOL) 325 MG tablet Take 2 tablets (650 mg total) by mouth every 6 (six) hours as needed for mild pain.    . Ascorbic Acid (VITAMIN C) 1000 MG tablet Take 1,000 mg by mouth daily.    Marland Kitchen aspirin 81 MG tablet Take 81 mg by mouth daily.    Marland Kitchen b complex vitamins tablet Take 1  tablet by mouth daily.    . carvedilol (COREG) 6.25 MG tablet Take 1 tablet (6.25 mg total) by mouth 2 (two) times daily. 60 tablet 3  . digoxin (LANOXIN) 0.125 MG tablet Take 0.0625 mg by mouth daily.    Marland Kitchen ezetimibe (ZETIA) 10 MG tablet Take 10 mg by mouth daily.    Marland Kitchen losartan (COZAAR) 25 MG tablet Take 1 tablet (25 mg total) by mouth at bedtime. 90 tablet 3  . Multiple Vitamin (MULTIVITAMIN WITH MINERALS) TABS tablet Take 1 tablet by mouth daily.    . Omega 3 1200 MG CAPS Take 1 capsule by mouth 2 (two) times daily.     Marland Kitchen pyridoxine (B-6) 100 MG tablet Take 100 mg by mouth daily.    Marland Kitchen spironolactone (ALDACTONE) 25 MG tablet Take 1 tablet (25 mg total) by mouth daily. 30 tablet 3   No current facility-administered medications for this encounter.    BP 96/60 (BP Location: Left Arm, Patient Position: Sitting, Cuff  Size: Normal)   Pulse (!) 58   Wt 158 lb (71.7 kg)   SpO2 96%   BMI 27.12 kg/m  General: NAD Neck: No JVD, no thyromegaly or thyroid nodule.  Lungs: Clear to auscultation bilaterally with normal respiratory effort. CV: Nondisplaced PMI.  Heart regular S1/S2, no S3/S4, no murmur.  No peripheral edema.  No carotid bruit.  Normal pedal pulses.  Abdomen: Soft, nontender, no hepatosplenomegaly, no distention.  Skin: Intact without lesions or rashes.  Neurologic: Alert and oriented x 3.  Psych: Normal affect. Extremities: No clubbing or cyanosis.  HEENT: Normal.   Assessment/Plan: 1. CAD: s/p CABG.  No ischemic chest pain.  - Continue ASA 81 and Zetia (has not been able to tolerate statins). - Continue cardiac rehab.    2. Hyperlipidemia: LDL still high on Zetia, cannot tolerate statins.  I will try again to get Repatha for him through lipid clinic.   3. Chronic systolic CHF: Ischemic cardiomyopathy: EF 15% on TEE in 7/17.  Hopefully he has had some improvement with CABG.  He was markedly volume overloaded in the hospital but appears euvolemic now.   - Continue Coreg 6.25 mg bid.   - Continue current digoxin, check level.   - Lasix only prn.  - Continue spironolactone 25 mg daily.  - Increase losartan to 25 mg daily but take in the evening rather than in the morning, he will call if lightheaded spells worsen.  BMET/BNP in 2 wks.  - He will need an echo in early 11/17 for ICD evaluation.  He would not be a CRT candidate with narrow QRS.  4. CKD: Improved off Lasix, follow over time.  5. Atrial fibrillation: Noted only post-op CABG.  Now off amiodarone.  If he has a recurrence, will need anticoagulation.    Followup in 2 months.   Loralie Champagne 10/21/2016

## 2016-10-21 NOTE — Telephone Encounter (Signed)
Sent message to [facility] scheduling to call pt to schedule w/ [facility]

## 2016-10-22 ENCOUNTER — Ambulatory Visit: Payer: PPO

## 2016-10-23 ENCOUNTER — Encounter (HOSPITAL_COMMUNITY)
Admission: RE | Admit: 2016-10-23 | Discharge: 2016-10-23 | Disposition: A | Discharge status: Home / Self Care | Payer: PPO | Source: Ambulatory Visit | Attending: Cardiology | Admitting: Cardiology

## 2016-10-23 DIAGNOSIS — Z951 Presence of aortocoronary bypass graft: Secondary | ICD-10-CM

## 2016-10-23 NOTE — Progress Notes (Signed)
Reviewed home exercise program with pt. Pt will be walking at home outside of [facility].  Discussed exercising w/in [facility], safe weather conditions and RPE.  Discussed signs and sypmtoms, NTG use and when to call MD or 911.  Pt verbalized understanding.  Cleda Mccreedy, MS 10/23/16 11:17

## 2016-10-24 ENCOUNTER — Ambulatory Visit: Payer: PPO

## 2016-10-25 ENCOUNTER — Encounter (HOSPITAL_COMMUNITY)
Admission: RE | Admit: 2016-10-25 | Discharge: 2016-10-25 | Disposition: A | Discharge status: Home / Self Care | Payer: PPO | Source: Ambulatory Visit | Attending: Cardiology | Admitting: Cardiology

## 2016-10-25 DIAGNOSIS — Z951 Presence of aortocoronary bypass graft: Secondary | ICD-10-CM | POA: Diagnosis not present

## 2016-10-28 ENCOUNTER — Encounter (HOSPITAL_COMMUNITY)
Admission: RE | Admit: 2016-10-28 | Discharge: 2016-10-28 | Disposition: A | Discharge status: Home / Self Care | Payer: PPO | Source: Ambulatory Visit | Attending: Cardiology | Admitting: Cardiology

## 2016-10-28 DIAGNOSIS — Z951 Presence of aortocoronary bypass graft: Secondary | ICD-10-CM | POA: Diagnosis not present

## 2016-10-28 NOTE — Progress Notes (Signed)
[name] [name] 80 y.o. male Nutrition Note Spoke with pt. Nutrition Plan and Nutrition Survey goals reviewed with pt. Pt is following Step 2 of the Therapeutic Lifestyle Changes diet. Pt wants to lose wt. Pt has been trying to lose wt by "exercising in rehab." Wt loss tips reviewed. Pt is pre-diabetic according to his last A1c. Per discussion, pt was aware of pre-diabetes dx and pt has a family h/o DM in his mother and sister. Pt with dx of CHF. Per discussion, pt does not use canned/convenience foods often. Pt rarely adds salt to food. Pt eats out infrequently. Age-appropriate nutrition recommendations reviewed. Pt expressed understanding of the information reviewed. Pt aware of nutrition education classes offered.  Lab Results  Component Value Date   HGBA1C 5.8 (H) 07/17/2016   Wt Readings from Last 3 Encounters:  10/21/16 158 lb (71.7 kg)  10/17/16 154 lb (69.9 kg)  10/03/16 155 lb 6.8 oz (70.5 kg)    Nutrition Diagnosis ? Food-and nutrition-related knowledge deficit related to lack of exposure to information as related to diagnosis of: ? CVD ? Pre-DM ? Overweight related to excessive energy intake as evidenced by a BMI of 27.2  Nutrition Intervention ? Pt's individual nutrition plan reviewed with pt. ? Benefits of adopting Therapeutic Lifestyle Changes discussed when Medficts reviewed. ? Pt to attend the Portion Distortion class ? Pt to attend the   ? Nutrition I class                  ? Nutrition II class ? Pt given handouts for: ? Nutrition I class ? Nutrition II class  ? Continue client-centered nutrition education by RD, as part of interdisciplinary care. Goal(s) ? Pt to identify food quantities necessary to achieve weight loss of 6-20 lb at graduation from cardiac rehab.  Monitor and Evaluate progress toward nutrition goal with team. Derek Mound, M.Ed, RD, LDN, CDE 10/28/2016 10:33 AM

## 2016-10-29 ENCOUNTER — Ambulatory Visit: Payer: PPO

## 2016-10-30 ENCOUNTER — Encounter (HOSPITAL_COMMUNITY)
Admission: RE | Admit: 2016-10-30 | Discharge: 2016-10-30 | Disposition: A | Discharge status: Home / Self Care | Payer: PPO | Source: Ambulatory Visit | Attending: Cardiology | Admitting: Cardiology

## 2016-10-30 ENCOUNTER — Telehealth (HOSPITAL_COMMUNITY): Payer: Self-pay

## 2016-10-30 DIAGNOSIS — Z79899 Other long term (current) drug therapy: Secondary | ICD-10-CM | POA: Diagnosis not present

## 2016-10-30 DIAGNOSIS — Z87442 Personal history of urinary calculi: Secondary | ICD-10-CM | POA: Diagnosis not present

## 2016-10-30 DIAGNOSIS — Z85118 Personal history of other malignant neoplasm of bronchus and lung: Secondary | ICD-10-CM | POA: Insufficient documentation

## 2016-10-30 DIAGNOSIS — I2510 Atherosclerotic heart disease of native coronary artery without angina pectoris: Secondary | ICD-10-CM | POA: Diagnosis not present

## 2016-10-30 DIAGNOSIS — Z85828 Personal history of other malignant neoplasm of skin: Secondary | ICD-10-CM | POA: Insufficient documentation

## 2016-10-30 DIAGNOSIS — Z951 Presence of aortocoronary bypass graft: Secondary | ICD-10-CM | POA: Diagnosis not present

## 2016-10-30 DIAGNOSIS — M1990 Unspecified osteoarthritis, unspecified site: Secondary | ICD-10-CM | POA: Insufficient documentation

## 2016-10-30 DIAGNOSIS — I10 Essential (primary) hypertension: Secondary | ICD-10-CM | POA: Diagnosis not present

## 2016-10-30 DIAGNOSIS — E785 Hyperlipidemia, unspecified: Secondary | ICD-10-CM | POA: Insufficient documentation

## 2016-10-30 DIAGNOSIS — Z87891 Personal history of nicotine dependence: Secondary | ICD-10-CM | POA: Diagnosis not present

## 2016-10-30 DIAGNOSIS — Z7982 Long term (current) use of aspirin: Secondary | ICD-10-CM | POA: Insufficient documentation

## 2016-10-30 NOTE — Telephone Encounter (Signed)
[name] [name] with [facility] called to report patient experiencing lightheadedness. BP 104/64 (not orthostatic), HR 81 [name] [name] gave patient Gatorade, now feeling better. Patient reports this was a very brief episode, weight stable, has not had this until this morning, does not think it is medication related, feels fine now. Advised to call us tomorrow morning if he continues to feel dizzy.  Otherwise, advised to call our office Friday morning to touch base and let us know he has felt fine. Aware and agreeable to plan as stated above.  Renee Pain, RN

## 2016-10-30 NOTE — Progress Notes (Signed)
Ed reported feeling lightheaded post exercise. Blood pressure 80/50. Patient given water. Recheck blood pressure 88/53 with the automatic blood pressure cuff. Heart rate 81. Patient given water then Gatorade. Repeat blood pressure 104/67 sitting. Standing blood pressure 100/67.  Vilinda Blanks RN called and notified. Jinny Blossom spoke with Ed over the phone. No complaints or symptoms upon exit from cardiac rehab. Will fax exercise flow sheets to Dr. Claris Gladden office for review.Harrell Gave RN BSN

## 2016-10-31 ENCOUNTER — Ambulatory Visit: Payer: PPO

## 2016-11-01 ENCOUNTER — Encounter (HOSPITAL_COMMUNITY)
Admission: RE | Admit: 2016-11-01 | Discharge: 2016-11-01 | Disposition: A | Discharge status: Home / Self Care | Payer: PPO | Source: Ambulatory Visit | Attending: Cardiology | Admitting: Cardiology

## 2016-11-01 DIAGNOSIS — Z951 Presence of aortocoronary bypass graft: Secondary | ICD-10-CM

## 2016-11-04 ENCOUNTER — Encounter (HOSPITAL_COMMUNITY)
Admission: RE | Admit: 2016-11-04 | Discharge: 2016-11-04 | Disposition: A | Discharge status: Home / Self Care | Payer: PPO | Source: Ambulatory Visit | Attending: Cardiology | Admitting: Cardiology

## 2016-11-04 DIAGNOSIS — Z951 Presence of aortocoronary bypass graft: Secondary | ICD-10-CM | POA: Diagnosis not present

## 2016-11-04 NOTE — Progress Notes (Signed)
Cardiac Individual Treatment Plan  Patient Details  Name: [name] [name] MRN: [id number] Date of Birth: Aug 09, 1936 Referring Provider:   Flowsheet Row CARDIAC REHAB PHASE II ORIENTATION from 10/03/2016 in [place]  Referring Provider  Loralie Champagne MD      Initial Encounter Date:  [place] PHASE II ORIENTATION from 10/03/2016 in [place]  Date  10/03/16  Referring Provider  Loralie Champagne MD      Visit Diagnosis: 07/19/16 S/P CABG x 4  Patient's Home Medications on Admission:  Current Outpatient Prescriptions:  .  acetaminophen (TYLENOL) 325 MG tablet, Take 2 tablets (650 mg total) by mouth every 6 (six) hours as needed for mild pain., Disp: , Rfl:  .  Ascorbic Acid (VITAMIN C) 1000 MG tablet, Take 1,000 mg by mouth daily., Disp: , Rfl:  .  aspirin 81 MG tablet, Take 81 mg by mouth daily., Disp: , Rfl:  .  b complex vitamins tablet, Take 1 tablet by mouth daily., Disp: , Rfl:  .  carvedilol (COREG) 6.25 MG tablet, Take 1 tablet (6.25 mg total) by mouth 2 (two) times daily., Disp: 60 tablet, Rfl: 3 .  digoxin (LANOXIN) 0.125 MG tablet, Take 0.5 tablets (0.0625 mg total) by mouth daily., Disp: 15 tablet, Rfl: 3 .  ezetimibe (ZETIA) 10 MG tablet, Take 10 mg by mouth daily., Disp: , Rfl:  .  losartan (COZAAR) 25 MG tablet, Take 1 tablet (25 mg total) by mouth at bedtime., Disp: 90 tablet, Rfl: 3 .  Multiple Vitamin (MULTIVITAMIN WITH MINERALS) TABS tablet, Take 1 tablet by mouth daily., Disp: , Rfl:  .  Omega 3 1200 MG CAPS, Take 1 capsule by mouth 2 (two) times daily. , Disp: , Rfl:  .  pyridoxine (B-6) 100 MG tablet, Take 100 mg by mouth daily., Disp: , Rfl:  .  spironolactone (ALDACTONE) 25 MG tablet, Take 1 tablet (25 mg total) by mouth daily., Disp: 30 tablet, Rfl: 3  Past Medical History: Past Medical History:  Diagnosis Date  . Arthritis    JOINT PAIN RIGHT HAND  . BPH (benign prostatic  hyperplasia)    [name]/elevated PSA, prostate biopsy x4 including one saturation biopsy, laser treatment 2/14; NOCTURIA  . CAD (coronary artery disease) 2013   cath 05/2012 showing 80-90% stenosis of a trifurcating diagonal #1, 70-80% stenosis of OM3 and 90% stenosis of distal LCx after OM3 - medical management, [place]  . Cardiomyopathy ([place])    dilated cardiomyopathy EF 30%, MUGA  EF 42% 08/2012  . Carotid artery occlusion    carotid artery bruit  . Chronic kidney disease    kidney stones -small passed.  . Diastolic dysfunction   . Heart murmur    as a child  . History of shingles   . Hyperlipidemia    statin intolerant  . Hypertension   . Lung mass    Stage 1B non-small cell lung CA s/p resection 2013  . PVC (premature ventricular contraction)   . Shingles   . Sigmoid diverticulosis   . Skin cancer    Multiple skin cancers   . Squamous cell carcinoma, face    history of right face with mets to right upper cheek in 2004  and facial lymph node reoccurence post surgery with XRT  . Trigger finger    Bilateral    Tobacco Use: History  Smoking Status  . Former Smoker  . Types: Cigarettes  . Quit date: 12/30/1965  Smokeless Tobacco  . Never Used    Labs: Recent Review Flowsheet Data    Labs for ITP Cardiac and Pulmonary Rehab Latest Ref Rng & Units 07/19/2016 07/19/2016 07/20/2016 07/22/2016 09/06/2016   Cholestrol 0 - 200 mg/dL - - - - 174   LDLCALC 0 - 99 mg/dL - - - - 110(H)   LDLDIRECT mg/dL - - - - -   HDL >40 mg/dL - - - - 46   Trlycerides <150 mg/dL - - - - 91   Hemoglobin A1c 4.8 - 5.6 % - - - - -   PHART 7.350 - 7.450 - 7.318(L) - - -   PCO2ART 35.0 - 45.0 mmHg - 45.0 - - -   HCO3 20.0 - 24.0 mEq/L - 23.0 - - -   TCO2 0 - 100 mmol/L '23 24 20 '$ - -   ACIDBASEDEF 0.0 - 2.0 mmol/L - 3.0(H) - - -   O2SAT % - 98.0 - 59.3 -      Capillary Blood Glucose: Lab Results  Component Value Date   GLUCAP 115 (H) 07/24/2016   GLUCAP 153 (H) 07/23/2016   GLUCAP 116 (H)  07/23/2016   GLUCAP 179 (H) 07/23/2016   GLUCAP 112 (H) 07/23/2016     Exercise Target Goals:    Exercise Program Goal: Individual exercise prescription set with THRR, safety & activity barriers. Participant demonstrates ability to understand and report RPE using BORG scale, to self-measure pulse accurately, and to acknowledge the importance of the exercise prescription.  Exercise Prescription Goal: Starting with aerobic activity 30 plus minutes a day, 3 days per week for initial exercise prescription. Provide home exercise prescription and guidelines that participant acknowledges understanding prior to discharge.  Activity Barriers & Risk Stratification:     Activity Barriers & Cardiac Risk Stratification - 10/03/16 0928      Activity Barriers & Cardiac Risk Stratification   Activity Barriers Deconditioning;Muscular Weakness;Balance Concerns;Shortness of Breath  Neuropathy in R hand   Cardiac Risk Stratification High      6 Minute Walk:     6 Minute Walk    Row Name 09/19/16 1502 10/03/16 1529       6 Minute Walk   Phase Initial Initial    Distance 1290 feet 1364 feet    Walk Time 6 minutes 6 minutes    # of Rest Breaks 0 0    MPH 2.44 2.58    METS 2.46 2.51    RPE 7 11    Perceived Dyspnea  2  -    VO2 Peak 8.61 8.79    Symptoms Yes (comment) No    Comments slightly short of breath  -    Resting HR 64 bpm 70 bpm    Resting BP 110/60 102/60    Max Ex. HR 113 bpm 104 bpm    Max Ex. BP 124/70 128/60    2 Minute Post BP 116/64 108/56       Initial Exercise Prescription:     Initial Exercise Prescription - 10/03/16 1500      Date of Initial Exercise RX and Referring Provider   Date 10/03/16   Referring Provider Loralie Champagne MD     Treadmill   MPH 2   Grade 0   Minutes 10   METs 2.53     NuStep   Level 3   Watts --   Minutes 10   METs 2     Cybex   Level 2  upright scifit  Minutes 10   METs 2     REL-XR   Level --   Watts --   Minutes  --   METs --     Prescription Details   Frequency (times per week) 2     Intensity   THRR 40-80% of Max Heartrate 95-126   Ratings of Perceived Exertion 11-15   Perceived Dyspnea 0-4     Progression   Progression Continue to progress workloads to maintain intensity without signs/symptoms of physical distress.     Resistance Training   Training Prescription Yes   Weight 2lbs   Reps 10-12      Perform Capillary Blood Glucose checks as needed.  Exercise Prescription Changes:      Exercise Prescription Changes    Row Name 09/19/16 1500 11/04/16 1700           Exercise Review   Progression -  walk test results  -        Response to Exercise   Blood Pressure (Admit) 110/60 142/70      Blood Pressure (Exercise) 124/70 124/60      Blood Pressure (Exit) 116/64 102/64      Heart Rate (Admit) 64 bpm 71 bpm      Heart Rate (Exercise) 113 bpm 100 bpm      Heart Rate (Exit) 73 bpm 66 bpm      Oxygen Saturation (Admit) 97 %  -      Oxygen Saturation (Exit) 98 %  -      Rating of Perceived Exertion (Exercise) 7  -      Perceived Dyspnea (Exercise) 2  -      Symptoms slight SOB  -      Duration  - Progress to 45 minutes of aerobic exercise without signs/symptoms of physical distress      Intensity  - THRR unchanged        Progression   Progression  - Continue to progress workloads to maintain intensity without signs/symptoms of physical distress.      Average METs  - 3.6        Resistance Training   Training Prescription  - Yes      Weight  - 3lbs      Reps  - 10-12        Treadmill   MPH  - -      Grade  - -      Minutes  - -      METs  - -        Recumbant Bike   Level  - 2      Minutes  - 10      METs  - 2        NuStep   Level  - 3      Minutes  - 10      METs  - 3.5        Cybex   Level  - 2  upright scifit      Minutes  - 10      METs  - 4.7         Exercise Comments:      Exercise Comments    Row Name 09/19/16 1506 10/09/16 1427 11/04/16  1740       Exercise Comments Main goal is to increase strength and stamina to regain lifestyle and not fatigue so easily. Patient will start exercise on 10/16/16. Pt is off to a good start with exercise  Discharge Exercise Prescription (Final Exercise Prescription Changes):     Exercise Prescription Changes - 11/04/16 1700      Response to Exercise   Blood Pressure (Admit) 142/70   Blood Pressure (Exercise) 124/60   Blood Pressure (Exit) 102/64   Heart Rate (Admit) 71 bpm   Heart Rate (Exercise) 100 bpm   Heart Rate (Exit) 66 bpm   Duration Progress to 45 minutes of aerobic exercise without signs/symptoms of physical distress   Intensity THRR unchanged     Progression   Progression Continue to progress workloads to maintain intensity without signs/symptoms of physical distress.   Average METs 3.6     Resistance Training   Training Prescription Yes   Weight 3lbs   Reps 10-12     Treadmill   MPH --   Grade --   Minutes --   METs --     Recumbant Bike   Level 2   Minutes 10   METs 2     NuStep   Level 3   Minutes 10   METs 3.5     Cybex   Level 2  upright scifit   Minutes 10   METs 4.7      Nutrition:  Target Goals: Understanding of nutrition guidelines, daily intake of sodium '1500mg'$ , cholesterol '200mg'$ , calories 30% from fat and 7% or less from saturated fats, daily to have 5 or more servings of fruits and vegetables.  Biometrics:     Pre Biometrics - 10/03/16 1521      Pre Biometrics   Weight 155 lb 6.8 oz (70.5 kg)   Hip Circumference 39.5 inches   Grip Strength 34 kg   Flexibility 0 in   Single Leg Stand 30 seconds       Nutrition Therapy Plan and Nutrition Goals:     Nutrition Therapy & Goals - 10/28/16 1031      Nutrition Therapy   Diet Therapeutic Lifestyle Changes     Personal Nutrition Goals   Personal Goal #1 1-2 lb wt loss/week to a wt loss goal of 6-20 lb at graduation from [place], educate and counsel regarding individualized specific dietary modifications aiming towards targeted core components such as weight, hypertension, lipid management, diabetes, heart failure and other comorbidities.   Expected Outcomes Short Term Goal: Understand basic principles of dietary content, such as calories, fat, sodium, cholesterol and nutrients.;Long Term Goal: Adherence to prescribed nutrition plan.      Nutrition Discharge: Nutrition Scores:     Nutrition Assessments - 10/28/16 1030      MEDFICTS Scores   Pre Score 36      Nutrition Goals Re-Evaluation:   Psychosocial: Target Goals: Acknowledge presence or absence of depression, maximize coping skills, provide positive support system. Participant is able to verbalize types and ability to use techniques and skills needed for reducing stress and depression.  Initial Review & Psychosocial Screening:     Initial Psych Review & Screening - 11/04/16 0858      Family Dynamics   Good Support System? Yes     Barriers   Psychosocial barriers to participate in program There are no identifiable barriers or psychosocial needs.     Screening Interventions   Interventions Encouraged to exercise      Quality of Life Scores:     Quality of Life - 10/03/16 1518      Quality of Life Scores   Health/Function Pre 24.47 %  Socioeconomic Pre 27.5 %   Psych/Spiritual Pre 30 %   Family Pre 26.4 %   GLOBAL Pre 26.51 %      PHQ-9: Recent Review Flowsheet Data    Depression screen [facility] 2/9 10/16/2016 09/19/2016 08/07/2016   Decreased Interest 0 0 0   Down, Depressed, Hopeless 0 0 0   PHQ - 2 Score 0 0 0   Altered sleeping - 1 -   Tired, decreased energy - 1 -   Change in appetite - 0 -   Feeling bad or failure about yourself  - 0 -   Trouble concentrating - 0 -   Moving slowly or fidgety/restless - 1 -   Suicidal thoughts - 0 -   PHQ-9 Score - 3 -   Difficult doing work/chores - Not difficult at all  -      Psychosocial Evaluation and Intervention:   Psychosocial Re-Evaluation:     Psychosocial Re-Evaluation    Row Name 11/04/16 0859             Psychosocial Re-Evaluation   Interventions Encouraged to attend Cardiac Rehabilitation for the exercise       Continued Psychosocial Services Needed No          Vocational Rehabilitation: Provide vocational rehab assistance to qualifying candidates.   Vocational Rehab Evaluation & Intervention:     Vocational Rehab - 10/03/16 1604      Initial Vocational Rehab Evaluation & Intervention   Assessment shows need for Vocational Rehabilitation No  Pt does not plan to return to competitive employment.  Pt is retired.      Education: Education Goals: Education classes will be provided on a weekly basis, covering required topics. Participant will state understanding/return demonstration of topics presented.  Learning Barriers/Preferences:     Learning Barriers/Preferences - 10/03/16 8119      Learning Barriers/Preferences   Learning Barriers Sight  readers   Learning Preferences Verbal Instruction;Written Material      Education Topics: Count Your Pulse:  -Group instruction provided by verbal instruction, demonstration, patient participation and written materials to support subject.  Instructors address importance of being able to find your pulse and how to count your pulse when at home without a heart monitor.  Patients get hands on experience counting their pulse with staff help and individually.   Heart Attack, Angina, and Risk Factor Modification:  -Group instruction provided by verbal instruction, video, and written materials to support subject.  Instructors address signs and symptoms of angina and heart attacks.    Also discuss risk factors for heart disease and how to make changes to improve heart health risk factors.   Functional Fitness:  -Group instruction provided by verbal instruction, demonstration,  patient participation, and written materials to support subject.  Instructors address safety measures for doing things around the house.  Discuss how to get up and down off the floor, how to pick things up properly, how to safely get out of a chair without assistance, and balance training. Flowsheet Row CARDIAC REHAB PHASE II EXERCISE from 10/28/2016 in [place]  Date  10/18/16  Educator  EP  Instruction Review Code  2- meets goals/outcomes      Meditation and Mindfulness:  -Group instruction provided by verbal instruction, patient participation, and written materials to support subject.  Instructor addresses importance of mindfulness and meditation practice to help reduce stress and improve awareness.  Instructor also leads participants through a meditation exercise.  [place]  PHASE II EXERCISE from 10/28/2016 in [place]  Date  10/23/16  Educator  Jeanella Craze  Instruction Review Code  2- meets goals/outcomes      Stretching for Flexibility and Mobility:  -Group instruction provided by verbal instruction, patient participation, and written materials to support subject.  Instructors lead participants through series of stretches that are designed to increase flexibility thus improving mobility.  These stretches are additional exercise for major muscle groups that are typically performed during regular warm up and cool down.   Hands Only CPR Anytime:  -Group instruction provided by verbal instruction, video, patient participation and written materials to support subject.  Instructors co-teach with AHA video for hands only CPR.  Participants get hands on experience with mannequins.   Nutrition I class: Heart Healthy Eating:  -Group instruction provided by PowerPoint slides, verbal discussion, and written materials to support subject matter. The instructor gives an explanation and review of the Therapeutic  Lifestyle Changes diet recommendations, which includes a discussion on lipid goals, dietary fat, sodium, fiber, plant stanol/sterol esters, sugar, and the components of a well-balanced, healthy diet. Flowsheet Row CARDIAC REHAB PHASE II EXERCISE from 10/28/2016 in [place]  Date  10/28/16  Educator  RD  Instruction Review Code  Not applicable [class handouts given]      Nutrition II class: Lifestyle Skills:  -Group instruction provided by PowerPoint slides, verbal discussion, and written materials to support subject matter. The instructor gives an explanation and review of label reading, grocery shopping for heart health, heart healthy recipe modifications, and ways to make healthier choices when eating out. Flowsheet Row CARDIAC REHAB PHASE II EXERCISE from 10/28/2016 in [place]  Date  10/28/16  Educator  RD  Instruction Review Code  Not applicable [class handouts given]      Diabetes Question & Answer:  -Group instruction provided by PowerPoint slides, verbal discussion, and written materials to support subject matter. The instructor gives an explanation and review of diabetes co-morbidities, pre- and post-prandial blood glucose goals, pre-exercise blood glucose goals, signs, symptoms, and treatment of hypoglycemia and hyperglycemia, and foot care basics.   Diabetes Blitz:  -Group instruction provided by PowerPoint slides, verbal discussion, and written materials to support subject matter. The instructor gives an explanation and review of the physiology behind type 1 and type 2 diabetes, diabetes medications and rational behind using different medications, pre- and post-prandial blood glucose recommendations and Hemoglobin A1c goals, diabetes diet, and exercise including blood glucose guidelines for exercising safely.    Portion Distortion:  -Group instruction provided by PowerPoint slides, verbal discussion, written  materials, and food models to support subject matter. The instructor gives an explanation of serving size versus portion size, changes in portions sizes over the last 20 years, and what consists of a serving from each food group.   Stress Management:  -Group instruction provided by verbal instruction, video, and written materials to support subject matter.  Instructors review role of stress in heart disease and how to cope with stress positively.     Exercising on Your Own:  -Group instruction provided by verbal instruction, power point, and written materials to support subject.  Instructors discuss benefits of exercise, components of exercise, frequency and intensity of exercise, and end points for exercise.  Also discuss use of nitroglycerin and activating EMS.  Review options of places to exercise outside of rehab.  Review guidelines for sex with heart disease.   Cardiac  Drugs I:  -Group instruction provided by verbal instruction and written materials to support subject.  Instructor reviews cardiac drug classes: antiplatelets, anticoagulants, beta blockers, and statins.  Instructor discusses reasons, side effects, and lifestyle considerations for each drug class.   Cardiac Drugs II:  -Group instruction provided by verbal instruction and written materials to support subject.  Instructor reviews cardiac drug classes: angiotensin converting enzyme inhibitors (ACE-I), angiotensin II receptor blockers (ARBs), nitrates, and calcium channel blockers.  Instructor discusses reasons, side effects, and lifestyle considerations for each drug class.   Anatomy and Physiology of the Circulatory System:  -Group instruction provided by verbal instruction, video, and written materials to support subject.  Reviews functional anatomy of heart, how it relates to various diagnoses, and what role the heart plays in the overall system.   Knowledge Questionnaire Score:     Knowledge Questionnaire Score -  10/03/16 1530      Knowledge Questionnaire Score   Pre Score 20/24      Core Components/Risk Factors/Patient Goals at Admission:     Personal Goals and Risk Factors at Admission - 10/03/16 0928      Core Components/Risk Factors/Patient Goals on Admission    Weight Management Yes;Weight Loss   Intervention Weight Management: Develop a combined nutrition and exercise program designed to reach desired caloric intake, while maintaining appropriate intake of nutrient and fiber, sodium and fats, and appropriate energy expenditure required for the weight goal.;Weight Management: Provide education and appropriate resources to help participant work on and attain dietary goals.;Weight Management/Obesity: Establish reasonable short term and long term weight goals.;Obesity: Provide education and appropriate resources to help participant work on and attain dietary goals.   Expected Outcomes Short Term: Continue to assess and modify interventions until short term weight is achieved;Long Term: Adherence to nutrition and physical activity/exercise program aimed toward attainment of established weight goal;Weight Maintenance: Understanding of the daily nutrition guidelines, which includes 25-35% calories from fat, 7% or less cal from saturated fats, less than '200mg'$  cholesterol, less than 1.5gm of sodium, & 5 or more servings of fruits and vegetables daily;Weight Loss: Understanding of general recommendations for a balanced deficit meal plan, which promotes 1-2 lb weight loss per week and includes a negative energy balance of [phone] kcal/d;Understanding recommendations for meals to include 15-35% energy as protein, 25-35% energy from fat, 35-60% energy from carbohydrates, less than '200mg'$  of dietary cholesterol, 20-35 gm of total fiber daily;Understanding of distribution of calorie intake throughout the day with the consumption of 4-5 meals/snacks   Sedentary Yes   Intervention Provide advice, education, support and  counseling about physical activity/exercise needs.;Develop an individualized exercise prescription for aerobic and resistive training based on initial evaluation findings, risk stratification, comorbidities and participant's personal goals.   Expected Outcomes Achievement of increased cardiorespiratory fitness and enhanced flexibility, muscular endurance and strength shown through measurements of functional capacity and personal statement of participant.   Increase Strength and Stamina Yes   Intervention Provide advice, education, support and counseling about physical activity/exercise needs.;Develop an individualized exercise prescription for aerobic and resistive training based on initial evaluation findings, risk stratification, comorbidities and participant's personal goals.   Expected Outcomes Achievement of increased cardiorespiratory fitness and enhanced flexibility, muscular endurance and strength shown through measurements of functional capacity and personal statement of participant.   Improve shortness of breath with ADL's Yes   Intervention Provide education, individualized exercise plan and daily activity instruction to help decrease symptoms of SOB with activities of daily living.   Expected Outcomes Short  Term: Achieves a reduction of symptoms when performing activities of daily living.   Personal Goal Other Yes   Personal Goal Get back to usual activities without fatigue and lose 5lbs   Intervention Provide nutrition and exercise programming to assist with weightloss and performing ADL's    Expected Outcomes Pt will be able to return to activities with less fatigue and lose 5lbs      Core Components/Risk Factors/Patient Goals Review:      Goals and Risk Factor Review    Row Name 11/04/16 1741             Core Components/Risk Factors/Patient Goals Review   Personal Goals Review Increase Strength and Stamina;Other       Review Pt wants to increase stamina and strength,  encouraged pt to exercise at home outside of [facility]       Expected Outcomes Pt will improve cardiorespiratory fitness and acheive fitness goals           Core Components/Risk Factors/Patient Goals at Discharge (Final Review):      Goals and Risk Factor Review - 11/04/16 1741      Core Components/Risk Factors/Patient Goals Review   Personal Goals Review Increase Strength and Stamina;Other   Review Pt wants to increase stamina and strength, encouraged pt to exercise at home outside of [facility]   Expected Outcomes Pt will improve cardiorespiratory fitness and acheive fitness goals       ITP Comments:     ITP Comments    Row Name 09/23/16 0949 10/03/16 0926         ITP Comments I returned Ed's vm that he wanted to attend Cardiac Rehab in [place], [place] at [facility] due to taking his grandson to school in [place]. I called and spoke to [place] at [facility] and she will contact Baird Lyons Dr. Fransico Him, Medical Director         Comments: Ed is making expected progress toward personal goals after completing 9 sessions. Recommend continued exercise and life style modification education including  stress management and relaxation techniques to decrease cardiac risk profile. Ed reports he can't tell if cardiac rehab has made him feel any better but he dose not feel any worse. Will continue to monitor the patient throughout  the [facility] RN BSN

## 2016-11-05 ENCOUNTER — Ambulatory Visit: Payer: PPO

## 2016-11-06 ENCOUNTER — Encounter (HOSPITAL_COMMUNITY)
Admission: RE | Admit: 2016-11-06 | Discharge: 2016-11-06 | Disposition: A | Discharge status: Home / Self Care | Payer: PPO | Source: Ambulatory Visit | Attending: Cardiology | Admitting: Cardiology

## 2016-11-06 DIAGNOSIS — Z951 Presence of aortocoronary bypass graft: Secondary | ICD-10-CM

## 2016-11-07 ENCOUNTER — Ambulatory Visit (HOSPITAL_BASED_OUTPATIENT_CLINIC_OR_DEPARTMENT_OTHER)
Admission: RE | Admit: 2016-11-07 | Discharge: 2016-11-07 | Disposition: A | Discharge status: Home / Self Care | Payer: PPO | Source: Ambulatory Visit | Attending: Cardiology | Admitting: Cardiology

## 2016-11-07 ENCOUNTER — Other Ambulatory Visit: Payer: Self-pay | Admitting: Physician Assistant

## 2016-11-07 ENCOUNTER — Ambulatory Visit (HOSPITAL_COMMUNITY)
Admission: RE | Admit: 2016-11-07 | Discharge: 2016-11-07 | Disposition: A | Discharge status: Home / Self Care | Payer: PPO | Source: Ambulatory Visit | Attending: Internal Medicine | Admitting: Internal Medicine

## 2016-11-07 ENCOUNTER — Ambulatory Visit: Payer: PPO

## 2016-11-07 DIAGNOSIS — I5189 Other ill-defined heart diseases: Secondary | ICD-10-CM

## 2016-11-07 DIAGNOSIS — N189 Chronic kidney disease, unspecified: Secondary | ICD-10-CM | POA: Insufficient documentation

## 2016-11-07 DIAGNOSIS — I519 Heart disease, unspecified: Secondary | ICD-10-CM

## 2016-11-07 DIAGNOSIS — I2510 Atherosclerotic heart disease of native coronary artery without angina pectoris: Secondary | ICD-10-CM | POA: Insufficient documentation

## 2016-11-07 DIAGNOSIS — E785 Hyperlipidemia, unspecified: Secondary | ICD-10-CM | POA: Diagnosis not present

## 2016-11-07 DIAGNOSIS — I130 Hypertensive heart and chronic kidney disease with heart failure and stage 1 through stage 4 chronic kidney disease, or unspecified chronic kidney disease: Secondary | ICD-10-CM | POA: Diagnosis not present

## 2016-11-07 LAB — BASIC METABOLIC PANEL
ANION GAP: 5 (ref 5–15)
BUN: 28 mg/dL — AB (ref 6–20)
CO2: 27 mmol/L (ref 22–32)
Calcium: 9.2 mg/dL (ref 8.9–10.3)
Chloride: 106 mmol/L (ref 101–111)
Creatinine, Ser: 0.98 mg/dL (ref 0.61–1.24)
GFR calc Af Amer: >60 mL/min (ref >60)
GLUCOSE: 101 mg/dL — AB (ref 65–99)
POTASSIUM: 4.5 mmol/L (ref 3.5–5.1)
Sodium: 138 mmol/L (ref 135–145)

## 2016-11-07 MED ORDER — DIGOXIN 125 MCG PO TABS: 0.0625 mg | ORAL_TABLET | Freq: Every day | ORAL | 3 refills | Status: DC

## 2016-11-07 NOTE — Progress Notes (Signed)
  Echocardiogram 2D Echocardiogram has been performed.  [name] [name] 11/07/2016, 10:00 AM

## 2016-11-07 NOTE — Addendum Note (Signed)
Encounter addended by: Harvie Junior, CMA on: 11/07/2016  9:58 AM<BR>    Actions taken: Order list changed

## 2016-11-08 ENCOUNTER — Encounter (HOSPITAL_COMMUNITY)
Admission: RE | Admit: 2016-11-08 | Discharge: 2016-11-08 | Disposition: A | Discharge status: Home / Self Care | Payer: PPO | Source: Ambulatory Visit | Attending: Cardiology | Admitting: Cardiology

## 2016-11-08 DIAGNOSIS — Z951 Presence of aortocoronary bypass graft: Secondary | ICD-10-CM

## 2016-11-11 ENCOUNTER — Encounter (HOSPITAL_COMMUNITY)
Admission: RE | Admit: 2016-11-11 | Discharge: 2016-11-11 | Disposition: A | Discharge status: Home / Self Care | Payer: PPO | Source: Ambulatory Visit | Attending: Cardiology | Admitting: Cardiology

## 2016-11-11 ENCOUNTER — Telehealth: Payer: Self-pay | Admitting: Cardiology

## 2016-11-11 DIAGNOSIS — Z951 Presence of aortocoronary bypass graft: Secondary | ICD-10-CM | POA: Diagnosis not present

## 2016-11-11 NOTE — Telephone Encounter (Signed)
New message      Calling to get echo results.  OK to talk to wife

## 2016-11-11 NOTE — Telephone Encounter (Signed)
This patient is followed in [facility], will forward 

## 2016-11-11 NOTE — Telephone Encounter (Signed)
Pt's wife aware of echo results

## 2016-11-12 ENCOUNTER — Ambulatory Visit: Payer: PPO

## 2016-11-13 ENCOUNTER — Encounter (HOSPITAL_COMMUNITY)
Admission: RE | Admit: 2016-11-13 | Discharge: 2016-11-13 | Disposition: A | Discharge status: Home / Self Care | Payer: PPO | Source: Ambulatory Visit | Attending: Cardiology | Admitting: Cardiology

## 2016-11-13 DIAGNOSIS — Z951 Presence of aortocoronary bypass graft: Secondary | ICD-10-CM

## 2016-11-14 ENCOUNTER — Ambulatory Visit: Payer: PPO

## 2016-11-14 ENCOUNTER — Encounter: Payer: Self-pay | Admitting: Cardiology

## 2016-11-14 DIAGNOSIS — I255 Ischemic cardiomyopathy: Secondary | ICD-10-CM | POA: Diagnosis not present

## 2016-11-14 DIAGNOSIS — I25810 Atherosclerosis of coronary artery bypass graft(s) without angina pectoris: Secondary | ICD-10-CM | POA: Diagnosis not present

## 2016-11-14 DIAGNOSIS — I10 Essential (primary) hypertension: Secondary | ICD-10-CM | POA: Diagnosis not present

## 2016-11-14 DIAGNOSIS — Z23 Encounter for immunization: Secondary | ICD-10-CM | POA: Diagnosis not present

## 2016-11-14 DIAGNOSIS — E7800 Pure hypercholesterolemia, unspecified: Secondary | ICD-10-CM | POA: Diagnosis not present

## 2016-11-14 DIAGNOSIS — R0683 Snoring: Secondary | ICD-10-CM | POA: Diagnosis not present

## 2016-11-15 ENCOUNTER — Encounter (HOSPITAL_COMMUNITY)
Admission: RE | Admit: 2016-11-15 | Discharge: 2016-11-15 | Disposition: A | Discharge status: Home / Self Care | Payer: PPO | Source: Ambulatory Visit | Attending: Cardiology | Admitting: Cardiology

## 2016-11-15 DIAGNOSIS — Z951 Presence of aortocoronary bypass graft: Secondary | ICD-10-CM | POA: Diagnosis not present

## 2016-11-18 ENCOUNTER — Encounter (HOSPITAL_COMMUNITY): Payer: PPO

## 2016-11-19 ENCOUNTER — Other Ambulatory Visit: Payer: Self-pay | Admitting: Cardiothoracic Surgery

## 2016-11-19 ENCOUNTER — Ambulatory Visit: Payer: PPO

## 2016-11-19 DIAGNOSIS — C3490 Malignant neoplasm of unspecified part of unspecified bronchus or lung: Secondary | ICD-10-CM

## 2016-11-20 ENCOUNTER — Encounter (HOSPITAL_COMMUNITY)
Admission: RE | Admit: 2016-11-20 | Discharge: 2016-11-20 | Disposition: A | Discharge status: Home / Self Care | Payer: PPO | Source: Ambulatory Visit | Attending: Cardiology | Admitting: Cardiology

## 2016-11-20 DIAGNOSIS — Z951 Presence of aortocoronary bypass graft: Secondary | ICD-10-CM

## 2016-11-25 ENCOUNTER — Encounter (HOSPITAL_COMMUNITY)
Admission: RE | Admit: 2016-11-25 | Discharge: 2016-11-25 | Disposition: A | Discharge status: Home / Self Care | Payer: PPO | Source: Ambulatory Visit | Attending: Cardiology | Admitting: Cardiology

## 2016-11-25 DIAGNOSIS — Z951 Presence of aortocoronary bypass graft: Secondary | ICD-10-CM | POA: Diagnosis not present

## 2016-11-26 ENCOUNTER — Ambulatory Visit: Payer: PPO

## 2016-11-26 DIAGNOSIS — I2510 Atherosclerotic heart disease of native coronary artery without angina pectoris: Secondary | ICD-10-CM | POA: Diagnosis not present

## 2016-11-26 DIAGNOSIS — I10 Essential (primary) hypertension: Secondary | ICD-10-CM | POA: Diagnosis not present

## 2016-11-26 DIAGNOSIS — R0602 Shortness of breath: Secondary | ICD-10-CM | POA: Diagnosis not present

## 2016-11-26 DIAGNOSIS — I5022 Chronic systolic (congestive) heart failure: Secondary | ICD-10-CM | POA: Diagnosis not present

## 2016-11-27 ENCOUNTER — Encounter (HOSPITAL_COMMUNITY)
Admission: RE | Admit: 2016-11-27 | Discharge: 2016-11-27 | Disposition: A | Discharge status: Home / Self Care | Payer: PPO | Source: Ambulatory Visit | Attending: Cardiology | Admitting: Cardiology

## 2016-11-27 DIAGNOSIS — Z951 Presence of aortocoronary bypass graft: Secondary | ICD-10-CM

## 2016-11-28 ENCOUNTER — Ambulatory Visit: Payer: PPO

## 2016-11-29 ENCOUNTER — Encounter (HOSPITAL_COMMUNITY)
Admission: RE | Admit: 2016-11-29 | Discharge: 2016-11-29 | Disposition: A | Discharge status: Home / Self Care | Payer: PPO | Source: Ambulatory Visit | Attending: Cardiology | Admitting: Cardiology

## 2016-11-29 DIAGNOSIS — E785 Hyperlipidemia, unspecified: Secondary | ICD-10-CM | POA: Insufficient documentation

## 2016-11-29 DIAGNOSIS — I2510 Atherosclerotic heart disease of native coronary artery without angina pectoris: Secondary | ICD-10-CM | POA: Insufficient documentation

## 2016-11-29 DIAGNOSIS — Z87891 Personal history of nicotine dependence: Secondary | ICD-10-CM | POA: Insufficient documentation

## 2016-11-29 DIAGNOSIS — Z951 Presence of aortocoronary bypass graft: Secondary | ICD-10-CM | POA: Insufficient documentation

## 2016-11-29 DIAGNOSIS — Z7982 Long term (current) use of aspirin: Secondary | ICD-10-CM | POA: Insufficient documentation

## 2016-11-29 DIAGNOSIS — Z85828 Personal history of other malignant neoplasm of skin: Secondary | ICD-10-CM | POA: Diagnosis not present

## 2016-11-29 DIAGNOSIS — Z79899 Other long term (current) drug therapy: Secondary | ICD-10-CM | POA: Insufficient documentation

## 2016-11-29 DIAGNOSIS — Z85118 Personal history of other malignant neoplasm of bronchus and lung: Secondary | ICD-10-CM | POA: Insufficient documentation

## 2016-11-29 DIAGNOSIS — M1990 Unspecified osteoarthritis, unspecified site: Secondary | ICD-10-CM | POA: Insufficient documentation

## 2016-11-29 DIAGNOSIS — Z87442 Personal history of urinary calculi: Secondary | ICD-10-CM | POA: Diagnosis not present

## 2016-11-29 DIAGNOSIS — I10 Essential (primary) hypertension: Secondary | ICD-10-CM | POA: Diagnosis not present

## 2016-12-02 ENCOUNTER — Encounter (HOSPITAL_COMMUNITY)
Admission: RE | Admit: 2016-12-02 | Discharge: 2016-12-02 | Disposition: A | Discharge status: Home / Self Care | Payer: PPO | Source: Ambulatory Visit | Attending: Cardiology | Admitting: Cardiology

## 2016-12-02 DIAGNOSIS — Z951 Presence of aortocoronary bypass graft: Secondary | ICD-10-CM | POA: Diagnosis not present

## 2016-12-03 ENCOUNTER — Ambulatory Visit: Payer: PPO

## 2016-12-04 ENCOUNTER — Encounter (HOSPITAL_COMMUNITY)
Admission: RE | Admit: 2016-12-04 | Discharge: 2016-12-04 | Disposition: A | Discharge status: Home / Self Care | Payer: PPO | Source: Ambulatory Visit | Attending: Cardiology | Admitting: Cardiology

## 2016-12-04 DIAGNOSIS — Z951 Presence of aortocoronary bypass graft: Secondary | ICD-10-CM

## 2016-12-05 ENCOUNTER — Ambulatory Visit: Payer: PPO

## 2016-12-05 NOTE — Progress Notes (Signed)
Cardiac Individual Treatment Plan  Patient Details  Name: [name] [name] MRN: [id number] Date of Birth: October 14, 1936 Referring Provider:   Flowsheet Row CARDIAC REHAB PHASE II ORIENTATION from 10/03/2016 in [place]  Referring Provider  Loralie Champagne MD      Initial Encounter Date:  [place] PHASE II ORIENTATION from 10/03/2016 in [place]  Date  10/03/16  Referring Provider  Loralie Champagne MD      Visit Diagnosis: 07/19/16 S/P CABG x 4  Patient's Home Medications on Admission:  Current Outpatient Prescriptions:  .  acetaminophen (TYLENOL) 325 MG tablet, Take 2 tablets (650 mg total) by mouth every 6 (six) hours as needed for mild pain., Disp: , Rfl:  .  Ascorbic Acid (VITAMIN C) 1000 MG tablet, Take 1,000 mg by mouth daily., Disp: , Rfl:  .  aspirin 81 MG tablet, Take 81 mg by mouth daily., Disp: , Rfl:  .  b complex vitamins tablet, Take 1 tablet by mouth daily., Disp: , Rfl:  .  carvedilol (COREG) 6.25 MG tablet, Take 1 tablet (6.25 mg total) by mouth 2 (two) times daily., Disp: 60 tablet, Rfl: 3 .  digoxin (LANOXIN) 0.125 MG tablet, Take 0.5 tablets (0.0625 mg total) by mouth daily., Disp: 15 tablet, Rfl: 3 .  ezetimibe (ZETIA) 10 MG tablet, Take 10 mg by mouth daily., Disp: , Rfl:  .  losartan (COZAAR) 25 MG tablet, Take 1 tablet (25 mg total) by mouth at bedtime., Disp: 90 tablet, Rfl: 3 .  Multiple Vitamin (MULTIVITAMIN WITH MINERALS) TABS tablet, Take 1 tablet by mouth daily., Disp: , Rfl:  .  Omega 3 1200 MG CAPS, Take 1 capsule by mouth 2 (two) times daily. , Disp: , Rfl:  .  pyridoxine (B-6) 100 MG tablet, Take 100 mg by mouth daily., Disp: , Rfl:  .  spironolactone (ALDACTONE) 25 MG tablet, Take 1 tablet (25 mg total) by mouth daily., Disp: 30 tablet, Rfl: 3  Past Medical History: Past Medical History:  Diagnosis Date  . Arthritis    JOINT PAIN RIGHT HAND  . BPH (benign prostatic  hyperplasia)    [name]/elevated PSA, prostate biopsy x4 including one saturation biopsy, laser treatment 2/14; NOCTURIA  . CAD (coronary artery disease) 2013   cath 05/2012 showing 80-90% stenosis of a trifurcating diagonal #1, 70-80% stenosis of OM3 and 90% stenosis of distal LCx after OM3 - medical management, [place]  . Cardiomyopathy ([place])    dilated cardiomyopathy EF 30%, MUGA  EF 42% 08/2012  . Carotid artery occlusion    carotid artery bruit  . Chronic kidney disease    kidney stones -small passed.  . Diastolic dysfunction   . Heart murmur    as a child  . History of shingles   . Hyperlipidemia    statin intolerant  . Hypertension   . Lung mass    Stage 1B non-small cell lung CA s/p resection 2013  . PVC (premature ventricular contraction)   . Shingles   . Sigmoid diverticulosis   . Skin cancer    Multiple skin cancers   . Squamous cell carcinoma, face    history of right face with mets to right upper cheek in 2004  and facial lymph node reoccurence post surgery with XRT  . Trigger finger    Bilateral    Tobacco Use: History  Smoking Status  . Former Smoker  . Types: Cigarettes  . Quit date: 12/30/1965  Smokeless Tobacco  . Never Used    Labs: Recent Review Flowsheet Data    Labs for ITP Cardiac and Pulmonary Rehab Latest Ref Rng & Units 07/19/2016 07/19/2016 07/20/2016 07/22/2016 09/06/2016   Cholestrol 0 - 200 mg/dL - - - - 174   LDLCALC 0 - 99 mg/dL - - - - 110(H)   LDLDIRECT mg/dL - - - - -   HDL >40 mg/dL - - - - 46   Trlycerides <150 mg/dL - - - - 91   Hemoglobin A1c 4.8 - 5.6 % - - - - -   PHART 7.350 - 7.450 - 7.318(L) - - -   PCO2ART 35.0 - 45.0 mmHg - 45.0 - - -   HCO3 20.0 - 24.0 mEq/L - 23.0 - - -   TCO2 0 - 100 mmol/L '23 24 20 '$ - -   ACIDBASEDEF 0.0 - 2.0 mmol/L - 3.0(H) - - -   O2SAT % - 98.0 - 59.3 -      Capillary Blood Glucose: Lab Results  Component Value Date   GLUCAP 115 (H) 07/24/2016   GLUCAP 153 (H) 07/23/2016   GLUCAP 116 (H)  07/23/2016   GLUCAP 179 (H) 07/23/2016   GLUCAP 112 (H) 07/23/2016     Exercise Target Goals:    Exercise Program Goal: Individual exercise prescription set with THRR, safety & activity barriers. Participant demonstrates ability to understand and report RPE using BORG scale, to self-measure pulse accurately, and to acknowledge the importance of the exercise prescription.  Exercise Prescription Goal: Starting with aerobic activity 30 plus minutes a day, 3 days per week for initial exercise prescription. Provide home exercise prescription and guidelines that participant acknowledges understanding prior to discharge.  Activity Barriers & Risk Stratification:     Activity Barriers & Cardiac Risk Stratification - 10/03/16 0928      Activity Barriers & Cardiac Risk Stratification   Activity Barriers Deconditioning;Muscular Weakness;Balance Concerns;Shortness of Breath  Neuropathy in R hand   Cardiac Risk Stratification High      6 Minute Walk:     6 Minute Walk    Row Name 10/03/16 1529         6 Minute Walk   Phase Initial     Distance 1364 feet     Walk Time 6 minutes     # of Rest Breaks 0     MPH 2.58     METS 2.51     RPE 11     VO2 Peak 8.79     Symptoms No     Resting HR 70 bpm     Resting BP 102/60     Max Ex. HR 104 bpm     Max Ex. BP 128/60     2 Minute Post BP 108/56        Initial Exercise Prescription:     Initial Exercise Prescription - 10/03/16 1500      Date of Initial Exercise RX and Referring Provider   Date 10/03/16   Referring Provider Loralie Champagne MD     Treadmill   MPH 2   Grade 0   Minutes 10   METs 2.53     NuStep   Level 3   Watts --   Minutes 10   METs 2     Cybex   Level 2  upright scifit   Minutes 10   METs 2     REL-XR   Level --   Watts --   Minutes --  METs --     Prescription Details   Frequency (times per week) 2     Intensity   THRR 40-80% of Max Heartrate 95-126   Ratings of Perceived Exertion  11-15   Perceived Dyspnea 0-4     Progression   Progression Continue to progress workloads to maintain intensity without signs/symptoms of physical distress.     Resistance Training   Training Prescription Yes   Weight 2lbs   Reps 10-12      Perform Capillary Blood Glucose checks as needed.  Exercise Prescription Changes:     Exercise Prescription Changes    Row Name 11/04/16 1700 12/02/16 1700           Response to Exercise   Blood Pressure (Admit) 142/70 140/42      Blood Pressure (Exercise) 124/60 104/66      Blood Pressure (Exit) 102/64 110/70      Heart Rate (Admit) 71 bpm 72 bpm      Heart Rate (Exercise) 100 bpm 110 bpm      Heart Rate (Exit) 66 bpm 75 bpm      Rating of Perceived Exertion (Exercise)  - 12      Duration Progress to 45 minutes of aerobic exercise without signs/symptoms of physical distress Progress to 45 minutes of aerobic exercise without signs/symptoms of physical distress      Intensity THRR unchanged THRR unchanged        Progression   Progression Continue to progress workloads to maintain intensity without signs/symptoms of physical distress. Continue to progress workloads to maintain intensity without signs/symptoms of physical distress.      Average METs 3.6 4.2        Resistance Training   Training Prescription Yes Yes      Weight 3lbs 3lbs      Reps 10-12 10-12        Treadmill   MPH - 2.5      Grade - 2      Minutes - 10      METs - 3.6        Recumbant Bike   Level 2  -      Minutes 10  -      METs 2  -        NuStep   Level 3 3      Minutes 10 10      METs 3.5 3.6        Cybex   Level 2  upright scifit 2  upright scifit      Minutes 10 10      METs 4.7 5.3         Exercise Comments:     Exercise Comments    Row Name 10/09/16 1427 11/04/16 1740 12/02/16 1729       Exercise Comments Patient will start exercise on 10/16/16. Pt is off to a good start with exercise  Reviewed METs and goals with pt.  Continues to  progress with exercise.         Discharge Exercise Prescription (Final Exercise Prescription Changes):     Exercise Prescription Changes - 12/02/16 1700      Response to Exercise   Blood Pressure (Admit) 140/42   Blood Pressure (Exercise) 104/66   Blood Pressure (Exit) 110/70   Heart Rate (Admit) 72 bpm   Heart Rate (Exercise) 110 bpm   Heart Rate (Exit) 75 bpm   Rating of Perceived Exertion (Exercise) 12   Duration Progress to 45  minutes of aerobic exercise without signs/symptoms of physical distress   Intensity THRR unchanged     Progression   Progression Continue to progress workloads to maintain intensity without signs/symptoms of physical distress.   Average METs 4.2     Resistance Training   Training Prescription Yes   Weight 3lbs   Reps 10-12     Treadmill   MPH 2.5   Grade 2   Minutes 10   METs 3.6     NuStep   Level 3   Minutes 10   METs 3.6     Cybex   Level 2  upright scifit   Minutes 10   METs 5.3      Nutrition:  Target Goals: Understanding of nutrition guidelines, daily intake of sodium '1500mg'$ , cholesterol '200mg'$ , calories 30% from fat and 7% or less from saturated fats, daily to have 5 or more servings of fruits and vegetables.  Biometrics:     Pre Biometrics - 10/03/16 1521      Pre Biometrics   Weight 155 lb 6.8 oz (70.5 kg)   Hip Circumference 39.5 inches   Grip Strength 34 kg   Flexibility 0 in   Single Leg Stand 30 seconds       Nutrition Therapy Plan and Nutrition Goals:     Nutrition Therapy & Goals - 10/28/16 1031      Nutrition Therapy   Diet Therapeutic Lifestyle Changes     Personal Nutrition Goals   Personal Goal #1 1-2 lb wt loss/week to a wt loss goal of 6-20 lb at graduation from [place], educate and counsel regarding individualized specific dietary modifications aiming towards targeted core components such as weight, hypertension, lipid management,  diabetes, heart failure and other comorbidities.   Expected Outcomes Short Term Goal: Understand basic principles of dietary content, such as calories, fat, sodium, cholesterol and nutrients.;Long Term Goal: Adherence to prescribed nutrition plan.      Nutrition Discharge: Nutrition Scores:     Nutrition Assessments - 10/28/16 1030      MEDFICTS Scores   Pre Score 36      Nutrition Goals Re-Evaluation:   Psychosocial: Target Goals: Acknowledge presence or absence of depression, maximize coping skills, provide positive support system. Participant is able to verbalize types and ability to use techniques and skills needed for reducing stress and depression.  Initial Review & Psychosocial Screening:     Initial Psych Review & Screening - 11/04/16 0858      Family Dynamics   Good Support System? Yes     Barriers   Psychosocial barriers to participate in program There are no identifiable barriers or psychosocial needs.     Screening Interventions   Interventions Encouraged to exercise      Quality of Life Scores:     Quality of Life - 10/03/16 1518      Quality of Life Scores   Health/Function Pre 24.47 %   Socioeconomic Pre 27.5 %   Psych/Spiritual Pre 30 %   Family Pre 26.4 %   GLOBAL Pre 26.51 %      PHQ-9: Recent Review Flowsheet Data    Depression screen [facility] 2/9 10/16/2016 09/19/2016 08/07/2016   Decreased Interest 0 0 0   Down, Depressed, Hopeless 0 0 0   PHQ - 2 Score 0 0 0   Altered sleeping - 1 -   Tired, decreased energy - 1 -   Change in appetite -  0 -   Feeling bad or failure about yourself  - 0 -   Trouble concentrating - 0 -   Moving slowly or fidgety/restless - 1 -   Suicidal thoughts - 0 -   PHQ-9 Score - 3 -   Difficult doing work/chores - Not difficult at all -      Psychosocial Evaluation and Intervention:   Psychosocial Re-Evaluation:     Psychosocial Re-Evaluation    Row Name 11/04/16 0859 12/05/16 1540            Psychosocial Re-Evaluation   Interventions Encouraged to attend Cardiac Rehabilitation for the exercise Encouraged to attend Cardiac Rehabilitation for the exercise      Continued Psychosocial Services Needed No No         Vocational Rehabilitation: Provide vocational rehab assistance to qualifying candidates.   Vocational Rehab Evaluation & Intervention:     Vocational Rehab - 10/03/16 1604      Initial Vocational Rehab Evaluation & Intervention   Assessment shows need for Vocational Rehabilitation No  Pt does not plan to return to competitive employment.  Pt is retired.      Education: Education Goals: Education classes will be provided on a weekly basis, covering required topics. Participant will state understanding/return demonstration of topics presented.  Learning Barriers/Preferences:     Learning Barriers/Preferences - 10/03/16 6568      Learning Barriers/Preferences   Learning Barriers Sight  readers   Learning Preferences Verbal Instruction;Written Material      Education Topics: Count Your Pulse:  -Group instruction provided by verbal instruction, demonstration, patient participation and written materials to support subject.  Instructors address importance of being able to find your pulse and how to count your pulse when at home without a heart monitor.  Patients get hands on experience counting their pulse with staff help and individually.   Heart Attack, Angina, and Risk Factor Modification:  -Group instruction provided by verbal instruction, video, and written materials to support subject.  Instructors address signs and symptoms of angina and heart attacks.    Also discuss risk factors for heart disease and how to make changes to improve heart health risk factors.   Functional Fitness:  -Group instruction provided by verbal instruction, demonstration, patient participation, and written materials to support subject.  Instructors address safety measures for  doing things around the house.  Discuss how to get up and down off the floor, how to pick things up properly, how to safely get out of a chair without assistance, and balance training. Flowsheet Row CARDIAC REHAB PHASE II EXERCISE from 10/28/2016 in [place]  Date  10/18/16  Educator  EP  Instruction Review Code  2- meets goals/outcomes      Meditation and Mindfulness:  -Group instruction provided by verbal instruction, patient participation, and written materials to support subject.  Instructor addresses importance of mindfulness and meditation practice to help reduce stress and improve awareness.  Instructor also leads participants through a meditation exercise.  Flowsheet Row CARDIAC REHAB PHASE II EXERCISE from 10/28/2016 in [place]  Date  10/23/16  Educator  Jeanella Craze  Instruction Review Code  2- meets goals/outcomes      Stretching for Flexibility and Mobility:  -Group instruction provided by verbal instruction, patient participation, and written materials to support subject.  Instructors lead participants through series of stretches that are designed to increase flexibility thus improving mobility.  These stretches are additional exercise for major muscle  groups that are typically performed during regular warm up and cool down.   Hands Only CPR Anytime:  -Group instruction provided by verbal instruction, video, patient participation and written materials to support subject.  Instructors co-teach with AHA video for hands only CPR.  Participants get hands on experience with mannequins.   Nutrition I class: Heart Healthy Eating:  -Group instruction provided by PowerPoint slides, verbal discussion, and written materials to support subject matter. The instructor gives an explanation and review of the Therapeutic Lifestyle Changes diet recommendations, which includes a discussion on lipid goals, dietary fat, sodium,  fiber, plant stanol/sterol esters, sugar, and the components of a well-balanced, healthy diet. Flowsheet Row CARDIAC REHAB PHASE II EXERCISE from 10/28/2016 in [place]  Date  10/28/16  Educator  RD  Instruction Review Code  Not applicable [class handouts given]      Nutrition II class: Lifestyle Skills:  -Group instruction provided by PowerPoint slides, verbal discussion, and written materials to support subject matter. The instructor gives an explanation and review of label reading, grocery shopping for heart health, heart healthy recipe modifications, and ways to make healthier choices when eating out. Flowsheet Row CARDIAC REHAB PHASE II EXERCISE from 10/28/2016 in [place]  Date  10/28/16  Educator  RD  Instruction Review Code  Not applicable [class handouts given]      Diabetes Question & Answer:  -Group instruction provided by PowerPoint slides, verbal discussion, and written materials to support subject matter. The instructor gives an explanation and review of diabetes co-morbidities, pre- and post-prandial blood glucose goals, pre-exercise blood glucose goals, signs, symptoms, and treatment of hypoglycemia and hyperglycemia, and foot care basics.   Diabetes Blitz:  -Group instruction provided by PowerPoint slides, verbal discussion, and written materials to support subject matter. The instructor gives an explanation and review of the physiology behind type 1 and type 2 diabetes, diabetes medications and rational behind using different medications, pre- and post-prandial blood glucose recommendations and Hemoglobin A1c goals, diabetes diet, and exercise including blood glucose guidelines for exercising safely.    Portion Distortion:  -Group instruction provided by PowerPoint slides, verbal discussion, written materials, and food models to support subject matter. The instructor gives an explanation of serving size  versus portion size, changes in portions sizes over the last 20 years, and what consists of a serving from each food group.   Stress Management:  -Group instruction provided by verbal instruction, video, and written materials to support subject matter.  Instructors review role of stress in heart disease and how to cope with stress positively.     Exercising on Your Own:  -Group instruction provided by verbal instruction, power point, and written materials to support subject.  Instructors discuss benefits of exercise, components of exercise, frequency and intensity of exercise, and end points for exercise.  Also discuss use of nitroglycerin and activating EMS.  Review options of places to exercise outside of rehab.  Review guidelines for sex with heart disease.   Cardiac Drugs I:  -Group instruction provided by verbal instruction and written materials to support subject.  Instructor reviews cardiac drug classes: antiplatelets, anticoagulants, beta blockers, and statins.  Instructor discusses reasons, side effects, and lifestyle considerations for each drug class.   Cardiac Drugs II:  -Group instruction provided by verbal instruction and written materials to support subject.  Instructor reviews cardiac drug classes: angiotensin converting enzyme inhibitors (ACE-I), angiotensin II receptor blockers (ARBs), nitrates, and calcium channel blockers.  Instructor discusses reasons, side effects, and lifestyle considerations for each drug class.   Anatomy and Physiology of the Circulatory System:  -Group instruction provided by verbal instruction, video, and written materials to support subject.  Reviews functional anatomy of heart, how it relates to various diagnoses, and what role the heart plays in the overall system.   Knowledge Questionnaire Score:     Knowledge Questionnaire Score - 10/03/16 1530      Knowledge Questionnaire Score   Pre Score 20/24      Core Components/Risk  Factors/Patient Goals at Admission:     Personal Goals and Risk Factors at Admission - 10/03/16 0928      Core Components/Risk Factors/Patient Goals on Admission    Weight Management Yes;Weight Loss   Intervention Weight Management: Develop a combined nutrition and exercise program designed to reach desired caloric intake, while maintaining appropriate intake of nutrient and fiber, sodium and fats, and appropriate energy expenditure required for the weight goal.;Weight Management: Provide education and appropriate resources to help participant work on and attain dietary goals.;Weight Management/Obesity: Establish reasonable short term and long term weight goals.;Obesity: Provide education and appropriate resources to help participant work on and attain dietary goals.   Expected Outcomes Short Term: Continue to assess and modify interventions until short term weight is achieved;Long Term: Adherence to nutrition and physical activity/exercise program aimed toward attainment of established weight goal;Weight Maintenance: Understanding of the daily nutrition guidelines, which includes 25-35% calories from fat, 7% or less cal from saturated fats, less than '200mg'$  cholesterol, less than 1.5gm of sodium, & 5 or more servings of fruits and vegetables daily;Weight Loss: Understanding of general recommendations for a balanced deficit meal plan, which promotes 1-2 lb weight loss per week and includes a negative energy balance of [id number] kcal/d;Understanding recommendations for meals to include 15-35% energy as protein, 25-35% energy from fat, 35-60% energy from carbohydrates, less than '200mg'$  of dietary cholesterol, 20-35 gm of total fiber daily;Understanding of distribution of calorie intake throughout the day with the consumption of 4-5 meals/snacks   Sedentary Yes   Intervention Provide advice, education, support and counseling about physical activity/exercise needs.;Develop an individualized exercise prescription  for aerobic and resistive training based on initial evaluation findings, risk stratification, comorbidities and participant's personal goals.   Expected Outcomes Achievement of increased cardiorespiratory fitness and enhanced flexibility, muscular endurance and strength shown through measurements of functional capacity and personal statement of participant.   Increase Strength and Stamina Yes   Intervention Provide advice, education, support and counseling about physical activity/exercise needs.;Develop an individualized exercise prescription for aerobic and resistive training based on initial evaluation findings, risk stratification, comorbidities and participant's personal goals.   Expected Outcomes Achievement of increased cardiorespiratory fitness and enhanced flexibility, muscular endurance and strength shown through measurements of functional capacity and personal statement of participant.   Improve shortness of breath with ADL's Yes   Intervention Provide education, individualized exercise plan and daily activity instruction to help decrease symptoms of SOB with activities of daily living.   Expected Outcomes Short Term: Achieves a reduction of symptoms when performing activities of daily living.   Personal Goal Other Yes   Personal Goal Get back to usual activities without fatigue and lose 5lbs   Intervention Provide nutrition and exercise programming to assist with weightloss and performing ADL's    Expected Outcomes Pt will be able to return to activities with less fatigue and lose 5lbs      Core Components/Risk Factors/Patient Goals Review:  Goals and Risk Factor Review    Row Name 11/04/16 1741 12/02/16 1729           Core Components/Risk Factors/Patient Goals Review   Personal Goals Review Increase Strength and Stamina;Other Increase Strength and Stamina;Other      Review Pt wants to increase stamina and strength, encouraged pt to exercise at home outside of [facility] Feels  like he's back to his normal self prior to his event.  Pt states he walks a lot at home.       Expected Outcomes Pt will improve cardiorespiratory fitness and acheive fitness goals  Continue to walk at home and increase workloads as tolerated in order to acheive fitness goal.         Core Components/Risk Factors/Patient Goals at Discharge (Final Review):      Goals and Risk Factor Review - 12/02/16 1729      Core Components/Risk Factors/Patient Goals Review   Personal Goals Review Increase Strength and Stamina;Other   Review Feels like he's back to his normal self prior to his event.  Pt states he walks a lot at home.    Expected Outcomes Continue to walk at home and increase workloads as tolerated in order to acheive fitness goal.      ITP Comments:     ITP Comments    Row Name 10/03/16 0926           ITP Comments Dr. Fransico Him, Medical Director          Comments: Ed is making expected progress toward personal goals after completing 21 sessions. Recommend continued exercise and life style modification education including  stress management and relaxation techniques to decrease cardiac risk profile. Mr Chapple has attended very few education classes.Barnet Pall, RN,BSN 12/05/2016 3:46 PM

## 2016-12-06 ENCOUNTER — Encounter (HOSPITAL_COMMUNITY)
Admission: RE | Admit: 2016-12-06 | Discharge: 2016-12-06 | Disposition: A | Discharge status: Home / Self Care | Payer: PPO | Source: Ambulatory Visit | Attending: Cardiology | Admitting: Cardiology

## 2016-12-06 DIAGNOSIS — Z951 Presence of aortocoronary bypass graft: Secondary | ICD-10-CM | POA: Diagnosis not present

## 2016-12-09 ENCOUNTER — Encounter (HOSPITAL_COMMUNITY)
Admission: RE | Admit: 2016-12-09 | Discharge: 2016-12-09 | Disposition: A | Discharge status: Home / Self Care | Payer: PPO | Source: Ambulatory Visit | Attending: Cardiology | Admitting: Cardiology

## 2016-12-09 DIAGNOSIS — Z951 Presence of aortocoronary bypass graft: Secondary | ICD-10-CM | POA: Diagnosis not present

## 2016-12-10 ENCOUNTER — Ambulatory Visit: Payer: PPO

## 2016-12-11 ENCOUNTER — Encounter (HOSPITAL_COMMUNITY)
Admission: RE | Admit: 2016-12-11 | Discharge: 2016-12-11 | Disposition: A | Discharge status: Home / Self Care | Payer: PPO | Source: Ambulatory Visit | Attending: Cardiology | Admitting: Cardiology

## 2016-12-11 DIAGNOSIS — Z951 Presence of aortocoronary bypass graft: Secondary | ICD-10-CM

## 2016-12-12 ENCOUNTER — Ambulatory Visit: Payer: PPO

## 2016-12-13 ENCOUNTER — Encounter (HOSPITAL_COMMUNITY)
Admission: RE | Admit: 2016-12-13 | Discharge: 2016-12-13 | Disposition: A | Discharge status: Home / Self Care | Payer: PPO | Source: Ambulatory Visit | Attending: Cardiology | Admitting: Cardiology

## 2016-12-13 DIAGNOSIS — Z951 Presence of aortocoronary bypass graft: Secondary | ICD-10-CM | POA: Diagnosis not present

## 2016-12-16 ENCOUNTER — Encounter (HOSPITAL_COMMUNITY)
Admission: RE | Admit: 2016-12-16 | Discharge: 2016-12-16 | Disposition: A | Discharge status: Home / Self Care | Payer: PPO | Source: Ambulatory Visit | Attending: Cardiology | Admitting: Cardiology

## 2016-12-16 DIAGNOSIS — Z951 Presence of aortocoronary bypass graft: Secondary | ICD-10-CM

## 2016-12-17 ENCOUNTER — Ambulatory Visit (HOSPITAL_COMMUNITY)
Admission: RE | Admit: 2016-12-17 | Discharge: 2016-12-17 | Disposition: A | Discharge status: Home / Self Care | Payer: PPO | Source: Ambulatory Visit | Attending: Cardiology | Admitting: Cardiology

## 2016-12-17 ENCOUNTER — Ambulatory Visit: Payer: PPO

## 2016-12-17 ENCOUNTER — Encounter (HOSPITAL_COMMUNITY): Payer: Self-pay

## 2016-12-17 VITALS — BP 112/66 | HR 71 | Wt 160.1 lb

## 2016-12-17 DIAGNOSIS — I4891 Unspecified atrial fibrillation: Secondary | ICD-10-CM | POA: Insufficient documentation

## 2016-12-17 DIAGNOSIS — Z87891 Personal history of nicotine dependence: Secondary | ICD-10-CM | POA: Diagnosis not present

## 2016-12-17 DIAGNOSIS — I255 Ischemic cardiomyopathy: Secondary | ICD-10-CM | POA: Diagnosis not present

## 2016-12-17 DIAGNOSIS — Z85828 Personal history of other malignant neoplasm of skin: Secondary | ICD-10-CM | POA: Diagnosis not present

## 2016-12-17 DIAGNOSIS — N400 Enlarged prostate without lower urinary tract symptoms: Secondary | ICD-10-CM | POA: Diagnosis not present

## 2016-12-17 DIAGNOSIS — Z7982 Long term (current) use of aspirin: Secondary | ICD-10-CM | POA: Diagnosis not present

## 2016-12-17 DIAGNOSIS — E785 Hyperlipidemia, unspecified: Secondary | ICD-10-CM

## 2016-12-17 DIAGNOSIS — I2510 Atherosclerotic heart disease of native coronary artery without angina pectoris: Secondary | ICD-10-CM | POA: Diagnosis not present

## 2016-12-17 DIAGNOSIS — Z9889 Other specified postprocedural states: Secondary | ICD-10-CM | POA: Insufficient documentation

## 2016-12-17 DIAGNOSIS — N189 Chronic kidney disease, unspecified: Secondary | ICD-10-CM | POA: Insufficient documentation

## 2016-12-17 DIAGNOSIS — I5022 Chronic systolic (congestive) heart failure: Secondary | ICD-10-CM | POA: Insufficient documentation

## 2016-12-17 DIAGNOSIS — Z951 Presence of aortocoronary bypass graft: Secondary | ICD-10-CM | POA: Insufficient documentation

## 2016-12-17 DIAGNOSIS — Z85118 Personal history of other malignant neoplasm of bronchus and lung: Secondary | ICD-10-CM | POA: Insufficient documentation

## 2016-12-17 LAB — LIPID PANEL
CHOL/HDL RATIO: 3.1 ratio
Cholesterol: 235 mg/dL — ABNORMAL HIGH (ref 0–200)
HDL: 75 mg/dL (ref >40)
LDL Cholesterol: 129 mg/dL — ABNORMAL HIGH (ref 0–99)
Triglycerides: 157 mg/dL — ABNORMAL HIGH (ref <150)
VLDL: 31 mg/dL (ref 0–40)

## 2016-12-17 NOTE — Patient Instructions (Signed)
STOP taking Digoxin  Labs today (will call for abnormal results, otherwise no news is good news)  Follow up in 3 months

## 2016-12-18 ENCOUNTER — Encounter (HOSPITAL_COMMUNITY)
Admission: RE | Admit: 2016-12-18 | Discharge: 2016-12-18 | Disposition: A | Discharge status: Home / Self Care | Payer: PPO | Source: Ambulatory Visit | Attending: Cardiology | Admitting: Cardiology

## 2016-12-18 DIAGNOSIS — Z951 Presence of aortocoronary bypass graft: Secondary | ICD-10-CM

## 2016-12-19 ENCOUNTER — Ambulatory Visit: Payer: PPO

## 2016-12-19 NOTE — Progress Notes (Signed)
PCP: Dr Lavone Orn HF Cardiology: Dr. Aundra Dubin Cardiac surgery: Dr. Servando Snare  80 yo with history of CAD s/p CABG, ischemic cardiomyopathy, and lung cancer s/p resection in 2013 presents for cardiology followup after recent hospitalization for CABG.  He was followed in the past by Dr Radford Pax.  EF was noted to be 35% in 9/16 by cMRI.  He had a high risk stress test in 6/17 that was followed by cath showing 3 VD.  He had CABG x 4 on 07/19/16.  TEE at the time of CABG showed EF 15%.    Post-op, he was initially hypotensive but this resolved.  He was markedly volume overloaded and was diuresed extensively.  He went home from the hospital on Lasix 60 mg po bid and weight continued to drop.  Lasix was stopped with rise in creatinine. Weight has stayed stable.  He is feeling good overall. No significant dyspnea.  He is doing cardiac rehab.  No lightheadedness or syncope.   No orthopnea/PND.  Numbness in fingers on his right hand is improving. SBP running 100s-110s.  Repeat echo (11/17) has shown EF up to 45%.   Labs (07/27/16): K 4.1, creatinine 1.14 Labs (08/08/16): K 5.4, creatinine 1.72 Labs (08/12/16): K 4.8 => 4.3, creatinine 1.12 => 1.71, digoxin 1.6 => 0.3 Labs (9/17): LDL 110, HDL 46, digoxin < 0.2, K 4.7, creatinine 1.03, BNP 142 Labs (11/17): K 4.5, creatinine 0.98  PMH: 1. CAD: CABG (07/19/16) with LIMA-LAD, SVG-D, SVG-OM, SVG-PDA following high risk stress Cardiolite.  2. Chronic systolic CHF: Ischemic cardiomyopathy.  - Cardiac MRI (9/16) with EF 35%, normal RV.  - Cardiolite (6/17) with EF 21%.  - TEE (7/17, in OR) with EF 15%.  - Echo (11/17) with EF 45%, diffuse hypokinesis, normal RV size and systolic function, PASP 22 mmHg.  3. H/o non-small cell lung CA, s/p resection in 2013. 4. BPH 5. PVCs 6. Atrial fibrillation: Noted only post-op in 7/17.  7. Skin cancers 8. Hyperlipidemia: Muscle pain and confusion with statin use.   Social History   Social History  . Marital status: Married     Spouse name: N/A  . Number of children: N/A  . Years of education: N/A   Occupational History  . Not on file.   Social History Main Topics  . Smoking status: Former Smoker    Types: Cigarettes    Quit date: 12/30/1965  . Smokeless tobacco: Never Used  . Alcohol use 3.0 - 3.6 oz/week    3 - 4 Glasses of wine, 2 Cans of beer per week     Comment: infrequently- "indulge heavly when I do"  . Drug use: No  . Sexual activity: Not on file   Other Topics Concern  . Not on file   Social History Narrative  . No narrative on file   Family History  Problem Relation Age of Onset  . Diabetes Father   . Psoriasis Child   . Heart disease Child     resuscitated from cardiac arrest from Brugada's syndrome  . Lung cancer Mother   . Heart disease Mother    ROS: All systems reviewed and negative except as per HPI.   Current Outpatient Prescriptions  Medication Sig Dispense Refill  . acetaminophen (TYLENOL) 325 MG tablet Take 2 tablets (650 mg total) by mouth every 6 (six) hours as needed for mild pain.    . Ascorbic Acid (VITAMIN C) 1000 MG tablet Take 1,000 mg by mouth daily.    Marland Kitchen aspirin 81  MG tablet Take 81 mg by mouth daily.    Marland Kitchen b complex vitamins tablet Take 1 tablet by mouth daily.    . carvedilol (COREG) 6.25 MG tablet Take 1 tablet (6.25 mg total) by mouth 2 (two) times daily. 60 tablet 3  . ezetimibe (ZETIA) 10 MG tablet Take 10 mg by mouth daily.    Marland Kitchen losartan (COZAAR) 25 MG tablet Take 1 tablet (25 mg total) by mouth at bedtime. 90 tablet 3  . Multiple Vitamin (MULTIVITAMIN WITH MINERALS) TABS tablet Take 1 tablet by mouth daily.    . Omega 3 1200 MG CAPS Take 1 capsule by mouth 2 (two) times daily.     Marland Kitchen pyridoxine (B-6) 100 MG tablet Take 100 mg by mouth daily.    Marland Kitchen spironolactone (ALDACTONE) 25 MG tablet Take 1 tablet (25 mg total) by mouth daily. 30 tablet 3   No current facility-administered medications for this encounter.    BP 112/66   Pulse 71   Wt 160 lb 1.9 oz  (72.6 kg)   SpO2 98%   BMI 27.48 kg/m  General: NAD Neck: No JVD, no thyromegaly or thyroid nodule.  Lungs: Clear to auscultation bilaterally with normal respiratory effort. CV: Nondisplaced PMI.  Heart regular S1/S2, no S3/S4, no murmur.  No peripheral edema.  No carotid bruit.  Normal pedal pulses.  Abdomen: Soft, nontender, no hepatosplenomegaly, no distention.  Skin: Intact without lesions or rashes.  Neurologic: Alert and oriented x 3.  Psych: Normal affect. Extremities: No clubbing or cyanosis.  HEENT: Normal.   Assessment/Plan: 1. CAD: s/p CABG.  No ischemic chest pain.  - Continue ASA 81 and Zetia (has not been able to tolerate statins). - Continue cardiac rehab.    2. Hyperlipidemia: LDL still high on Zetia, cannot tolerate statins.  We discussed again starting Repatha, but he wants to try "natural meds" like garlic first .  3. Chronic systolic CHF: Ischemic cardiomyopathy: EF 15% on TEE in 7/17 but improved in 11/17 to 45%.  He was markedly volume overloaded in the hospital but appears euvolemic now.   - Continue Coreg 6.25 mg bid.   - With improvement in EF, he can stop digoxin.   - Lasix only prn.  - Continue spironolactone 25 mg daily.  - Continue losartan 25 mg daily.   4. CKD: Improved off Lasix, follow over time.  5. Atrial fibrillation: Noted only post-op CABG.  Now off amiodarone.  If he has a recurrence, will need anticoagulation.    Followup in 3 months.   Loralie Champagne 12/19/2016

## 2016-12-20 ENCOUNTER — Encounter (HOSPITAL_COMMUNITY)
Admission: RE | Admit: 2016-12-20 | Discharge: 2016-12-20 | Disposition: A | Discharge status: Home / Self Care | Payer: PPO | Source: Ambulatory Visit | Attending: Cardiology | Admitting: Cardiology

## 2016-12-20 DIAGNOSIS — Z951 Presence of aortocoronary bypass graft: Secondary | ICD-10-CM

## 2016-12-24 ENCOUNTER — Ambulatory Visit: Payer: PPO

## 2016-12-25 ENCOUNTER — Encounter (HOSPITAL_COMMUNITY)
Admission: RE | Admit: 2016-12-25 | Discharge: 2016-12-25 | Disposition: A | Discharge status: Home / Self Care | Payer: PPO | Source: Ambulatory Visit | Attending: Cardiology | Admitting: Cardiology

## 2016-12-25 DIAGNOSIS — Z951 Presence of aortocoronary bypass graft: Secondary | ICD-10-CM | POA: Diagnosis not present

## 2016-12-26 ENCOUNTER — Ambulatory Visit: Payer: PPO

## 2016-12-27 ENCOUNTER — Encounter (HOSPITAL_COMMUNITY)
Admission: RE | Admit: 2016-12-27 | Discharge: 2016-12-27 | Disposition: A | Discharge status: Home / Self Care | Payer: PPO | Source: Ambulatory Visit | Attending: Cardiology | Admitting: Cardiology

## 2016-12-27 DIAGNOSIS — Z951 Presence of aortocoronary bypass graft: Secondary | ICD-10-CM | POA: Diagnosis not present

## 2016-12-31 ENCOUNTER — Ambulatory Visit: Payer: PPO

## 2016-12-31 NOTE — Progress Notes (Signed)
Cardiac Individual Treatment Plan  Patient Details  Name: [name] [name] MRN: [id number] Date of Birth: [birth year] Referring Provider:   Flowsheet Row CARDIAC REHAB PHASE II ORIENTATION from 10/03/2016 in [place] [facility]  Referring Provider  Loralie Champagne MD      Initial Encounter Date:  [place] PHASE II ORIENTATION from 10/03/2016 in [facility]  Date  10/03/16  Referring Provider  Loralie Champagne MD      Visit Diagnosis: 07/19/16 S/P CABG x 4  Patient's Home Medications on Admission:  Current Outpatient Prescriptions:  .  acetaminophen (TYLENOL) 325 MG tablet, Take 2 tablets (650 mg total) by mouth every 6 (six) hours as needed for mild pain., Disp: , Rfl:  .  Ascorbic Acid (VITAMIN C) 1000 MG tablet, Take 1,000 mg by mouth daily., Disp: , Rfl:  .  aspirin 81 MG tablet, Take 81 mg by mouth daily., Disp: , Rfl:  .  b complex vitamins tablet, Take 1 tablet by mouth daily., Disp: , Rfl:  .  carvedilol (COREG) 6.25 MG tablet, Take 1 tablet (6.25 mg total) by mouth 2 (two) times daily., Disp: 60 tablet, Rfl: 3 .  ezetimibe (ZETIA) 10 MG tablet, Take 10 mg by mouth daily., Disp: , Rfl:  .  losartan (COZAAR) 25 MG tablet, Take 1 tablet (25 mg total) by mouth at bedtime., Disp: 90 tablet, Rfl: 3 .  Multiple Vitamin (MULTIVITAMIN WITH MINERALS) TABS tablet, Take 1 tablet by mouth daily., Disp: , Rfl:  .  Omega 3 1200 MG CAPS, Take 1 capsule by mouth 2 (two) times daily. , Disp: , Rfl:  .  pyridoxine (B-6) 100 MG tablet, Take 100 mg by mouth daily., Disp: , Rfl:  .  spironolactone (ALDACTONE) 25 MG tablet, Take 1 tablet (25 mg total) by mouth daily., Disp: 30 tablet, Rfl: 3  Past Medical History: Past Medical History:  Diagnosis Date  . Arthritis    JOINT PAIN RIGHT HAND  . BPH (benign prostatic hyperplasia)    [name]/elevated PSA, prostate biopsy x4 including one saturation biopsy, laser treatment 2/14;  NOCTURIA  . CAD (coronary artery disease) 2013   cath 05/2012 showing 80-90% stenosis of a trifurcating diagonal #1, 70-80% stenosis of OM3 and 90% stenosis of distal LCx after OM3 - medical management, [facility]  . Cardiomyopathy ([place])    dilated cardiomyopathy EF 30%, MUGA  EF 42% 08/2012  . Carotid artery occlusion    carotid artery bruit  . Chronic kidney disease    kidney stones -small passed.  . Diastolic dysfunction   . Heart murmur    as a child  . History of shingles   . Hyperlipidemia    statin intolerant  . Hypertension   . Lung mass    Stage 1B non-small cell lung CA s/p resection 2013  . PVC (premature ventricular contraction)   . Shingles   . Sigmoid diverticulosis   . Skin cancer    Multiple skin cancers   . Squamous cell carcinoma, face    history of right face with mets to right upper cheek in 2004  and facial lymph node reoccurence post surgery with XRT  . Trigger finger    Bilateral    Tobacco Use: History  Smoking Status  . Former Smoker  . Types: Cigarettes  . Quit date: 12/30/1965  Smokeless Tobacco  . Never Used    Labs: Recent Review Flowsheet Data    Labs for ITP Cardiac  and Pulmonary Rehab Latest Ref Rng & Units 07/19/2016 07/20/2016 07/22/2016 09/06/2016 12/17/2016   Cholestrol 0 - 200 mg/dL - - - 174 235(H)   LDLCALC 0 - 99 mg/dL - - - 110(H) 129(H)   LDLDIRECT mg/dL - - - - -   HDL >40 mg/dL - - - 46 75   Trlycerides <150 mg/dL - - - 91 157(H)   Hemoglobin A1c 4.8 - 5.6 % - - - - -   PHART 7.350 - 7.450 7.318(L) - - - -   PCO2ART 35.0 - 45.0 mmHg 45.0 - - - -   HCO3 20.0 - 24.0 mEq/L 23.0 - - - -   TCO2 0 - 100 mmol/L 24 20 - - -   ACIDBASEDEF 0.0 - 2.0 mmol/L 3.0(H) - - - -   O2SAT % 98.0 - 59.3 - -      Capillary Blood Glucose: Lab Results  Component Value Date   GLUCAP 115 (H) 07/24/2016   GLUCAP 153 (H) 07/23/2016   GLUCAP 116 (H) 07/23/2016   GLUCAP 179 (H) 07/23/2016   GLUCAP 112 (H) 07/23/2016     Exercise Target Goals:     Exercise Program Goal: Individual exercise prescription set with THRR, safety & activity barriers. Participant demonstrates ability to understand and report RPE using BORG scale, to self-measure pulse accurately, and to acknowledge the importance of the exercise prescription.  Exercise Prescription Goal: Starting with aerobic activity 30 plus minutes a day, 3 days per week for initial exercise prescription. Provide home exercise prescription and guidelines that participant acknowledges understanding prior to discharge.  Activity Barriers & Risk Stratification:     Activity Barriers & Cardiac Risk Stratification - 10/03/16 0928      Activity Barriers & Cardiac Risk Stratification   Activity Barriers Deconditioning;Muscular Weakness;Balance Concerns;Shortness of Breath  Neuropathy in R hand   Cardiac Risk Stratification High      6 Minute Walk:     6 Minute Walk    Row Name 10/03/16 1529         6 Minute Walk   Phase Initial     Distance 1364 feet     Walk Time 6 minutes     # of Rest Breaks 0     MPH 2.58     METS 2.51     RPE 11     VO2 Peak 8.79     Symptoms No     Resting HR 70 bpm     Resting BP 102/60     Max Ex. HR 104 bpm     Max Ex. BP 128/60     2 Minute Post BP 108/56        Initial Exercise Prescription:     Initial Exercise Prescription - 10/03/16 1500      Date of Initial Exercise RX and Referring Provider   Date 10/03/16   Referring Provider Loralie Champagne MD     Treadmill   MPH 2   Grade 0   Minutes 10   METs 2.53     NuStep   Level 3   Watts --   Minutes 10   METs 2     Cybex   Level 2  upright scifit   Minutes 10   METs 2     REL-XR   Level --   Watts --   Minutes --   METs --     Prescription Details   Frequency (times per week) 2  Intensity   THRR 40-80% of Max Heartrate 95-126   Ratings of Perceived Exertion 11-15   Perceived Dyspnea 0-4     Progression   Progression Continue to progress workloads to  maintain intensity without signs/symptoms of physical distress.     Resistance Training   Training Prescription Yes   Weight 2lbs   Reps 10-12      Perform Capillary Blood Glucose checks as needed.  Exercise Prescription Changes:      Exercise Prescription Changes    Row Name 11/04/16 1700 12/02/16 1700 01/01/17 1400         Exercise Review   Progression  -  - Yes       Response to Exercise   Blood Pressure (Admit) 142/70 140/42 104/70     Blood Pressure (Exercise) 124/60 104/66 140/80     Blood Pressure (Exit) 102/64 110/70 104/70     Heart Rate (Admit) 71 bpm 72 bpm 79 bpm     Heart Rate (Exercise) 100 bpm 110 bpm 116 bpm     Heart Rate (Exit) 66 bpm 75 bpm 78 bpm     Rating of Perceived Exertion (Exercise)  - 12 12     Duration Progress to 45 minutes of aerobic exercise without signs/symptoms of physical distress Progress to 45 minutes of aerobic exercise without signs/symptoms of physical distress Progress to 45 minutes of aerobic exercise without signs/symptoms of physical distress     Intensity THRR unchanged THRR unchanged THRR unchanged       Progression   Progression Continue to progress workloads to maintain intensity without signs/symptoms of physical distress. Continue to progress workloads to maintain intensity without signs/symptoms of physical distress. Continue to progress workloads to maintain intensity without signs/symptoms of physical distress.     Average METs 3.6 4.2 4       Resistance Training   Training Prescription Yes Yes Yes     Weight 3lbs 3lbs 3lbs     Reps 10-12 10-12 10-12       Treadmill   MPH - 2.5 2.5     Grade - 2 2     Minutes - 10 10     METs - 3.6 3.6       Recumbant Bike   Level 2  -  -     Minutes 10  -  -     METs 2  -  -       NuStep   Level '3 3 5     '$ Minutes '10 10 10     '$ METs 3.5 3.6 3.6       Cybex   Level 2  upright scifit 2  upright scifit 2  upright scifit     Minutes '10 10 10     '$ METs 4.7 5.3 5.3        Home Exercise Plan   Plans to continue exercise at  -  - Home     Frequency  -  - Add 2 additional days to program exercise sessions.        Exercise Comments:      Exercise Comments    Row Name 10/09/16 1427 11/04/16 1740 12/02/16 1729 01/01/17 1415     Exercise Comments Patient will start exercise on 10/16/16. Pt is off to a good start with exercise  Reviewed METs and goals with pt.  Continues to progress with exercise.  Reviewed METs and goals with pt.  Pt is doing well with exercise  Discharge Exercise Prescription (Final Exercise Prescription Changes):     Exercise Prescription Changes - 01/01/17 1400      Exercise Review   Progression Yes     Response to Exercise   Blood Pressure (Admit) 104/70   Blood Pressure (Exercise) 140/80   Blood Pressure (Exit) 104/70   Heart Rate (Admit) 79 bpm   Heart Rate (Exercise) 116 bpm   Heart Rate (Exit) 78 bpm   Rating of Perceived Exertion (Exercise) 12   Duration Progress to 45 minutes of aerobic exercise without signs/symptoms of physical distress   Intensity THRR unchanged     Progression   Progression Continue to progress workloads to maintain intensity without signs/symptoms of physical distress.   Average METs 4     Resistance Training   Training Prescription Yes   Weight 3lbs   Reps 10-12     Treadmill   MPH 2.5   Grade 2   Minutes 10   METs 3.6     NuStep   Level 5   Minutes 10   METs 3.6     Cybex   Level 2  upright scifit   Minutes 10   METs 5.3     Home Exercise Plan   Plans to continue exercise at Home   Frequency Add 2 additional days to program exercise sessions.      Nutrition:  Target Goals: Understanding of nutrition guidelines, daily intake of sodium '1500mg'$ , cholesterol '200mg'$ , calories 30% from fat and 7% or less from saturated fats, daily to have 5 or more servings of fruits and vegetables.  Biometrics:     Pre Biometrics - 10/03/16 1521      Pre Biometrics   Weight 155 lb  6.8 oz (70.5 kg)   Hip Circumference 39.5 inches   Grip Strength 34 kg   Flexibility 0 in   Single Leg Stand 30 seconds       Nutrition Therapy Plan and Nutrition Goals:     Nutrition Therapy & Goals - 10/28/16 1031      Nutrition Therapy   Diet Therapeutic Lifestyle Changes     Personal Nutrition Goals   Personal Goal #1 1-2 lb wt loss/week to a wt loss goal of 6-20 lb at graduation from [place], educate and counsel regarding individualized specific dietary modifications aiming towards targeted core components such as weight, hypertension, lipid management, diabetes, heart failure and other comorbidities.   Expected Outcomes Short Term Goal: Understand basic principles of dietary content, such as calories, fat, sodium, cholesterol and nutrients.;Long Term Goal: Adherence to prescribed nutrition plan.      Nutrition Discharge: Nutrition Scores:     Nutrition Assessments - 10/28/16 1030      MEDFICTS Scores   Pre Score 36      Nutrition Goals Re-Evaluation:   Psychosocial: Target Goals: Acknowledge presence or absence of depression, maximize coping skills, provide positive support system. Participant is able to verbalize types and ability to use techniques and skills needed for reducing stress and depression.  Initial Review & Psychosocial Screening:     Initial Psych Review & Screening - 11/04/16 0858      Family Dynamics   Good Support System? Yes     Barriers   Psychosocial barriers to participate in program There are no identifiable barriers or psychosocial needs.     Screening Interventions   Interventions Encouraged to exercise      Quality of Life Scores:  Quality of Life - 10/03/16 1518      Quality of Life Scores   Health/Function Pre 24.47 %   Socioeconomic Pre 27.5 %   Psych/Spiritual Pre 30 %   Family Pre 26.4 %   GLOBAL Pre 26.51 %      PHQ-9: Recent Review Flowsheet Data     Depression screen [facility] 2/9 10/16/2016 09/19/2016 08/07/2016   Decreased Interest 0 0 0   Down, Depressed, Hopeless 0 0 0   PHQ - 2 Score 0 0 0   Altered sleeping - 1 -   Tired, decreased energy - 1 -   Change in appetite - 0 -   Feeling bad or failure about yourself  - 0 -   Trouble concentrating - 0 -   Moving slowly or fidgety/restless - 1 -   Suicidal thoughts - 0 -   PHQ-9 Score - 3 -   Difficult doing work/chores - Not difficult at all -      Psychosocial Evaluation and Intervention:   Psychosocial Re-Evaluation:     Psychosocial Re-Evaluation    Row Name 11/04/16 0859 12/05/16 1540 12/31/16 1657         Psychosocial Re-Evaluation   Interventions Encouraged to attend Cardiac Rehabilitation for the exercise Encouraged to attend Cardiac Rehabilitation for the exercise Encouraged to attend Cardiac Rehabilitation for the exercise     Continued Psychosocial Services Needed No No No        Vocational Rehabilitation: Provide vocational rehab assistance to qualifying candidates.   Vocational Rehab Evaluation & Intervention:     Vocational Rehab - 10/03/16 1604      Initial Vocational Rehab Evaluation & Intervention   Assessment shows need for Vocational Rehabilitation No  Pt does not plan to return to competitive employment.  Pt is retired.      Education: Education Goals: Education classes will be provided on a weekly basis, covering required topics. Participant will state understanding/return demonstration of topics presented.  Learning Barriers/Preferences:     Learning Barriers/Preferences - 10/03/16 0932      Learning Barriers/Preferences   Learning Barriers Sight  readers   Learning Preferences Verbal Instruction;Written Material      Education Topics: Count Your Pulse:  -Group instruction provided by verbal instruction, demonstration, patient participation and written materials to support subject.  Instructors address importance of being able to find  your pulse and how to count your pulse when at home without a heart monitor.  Patients get hands on experience counting their pulse with staff help and individually.   Heart Attack, Angina, and Risk Factor Modification:  -Group instruction provided by verbal instruction, video, and written materials to support subject.  Instructors address signs and symptoms of angina and heart attacks.    Also discuss risk factors for heart disease and how to make changes to improve heart health risk factors.   Functional Fitness:  -Group instruction provided by verbal instruction, demonstration, patient participation, and written materials to support subject.  Instructors address safety measures for doing things around the house.  Discuss how to get up and down off the floor, how to pick things up properly, how to safely get out of a chair without assistance, and balance training. Flowsheet Row CARDIAC REHAB PHASE II EXERCISE from 10/28/2016 in [place]  Date  10/18/16  Educator  EP  Instruction Review Code  2- meets goals/outcomes      Meditation and Mindfulness:  -Group instruction provided by verbal instruction, patient  participation, and written materials to support subject.  Instructor addresses importance of mindfulness and meditation practice to help reduce stress and improve awareness.  Instructor also leads participants through a meditation exercise.  Flowsheet Row CARDIAC REHAB PHASE II EXERCISE from 10/28/2016 in [place]  Date  10/23/16  Educator  Jeanella Craze  Instruction Review Code  2- meets goals/outcomes      Stretching for Flexibility and Mobility:  -Group instruction provided by verbal instruction, patient participation, and written materials to support subject.  Instructors lead participants through series of stretches that are designed to increase flexibility thus improving mobility.  These stretches are additional  exercise for major muscle groups that are typically performed during regular warm up and cool down.   Hands Only CPR Anytime:  -Group instruction provided by verbal instruction, video, patient participation and written materials to support subject.  Instructors co-teach with AHA video for hands only CPR.  Participants get hands on experience with mannequins.   Nutrition I class: Heart Healthy Eating:  -Group instruction provided by PowerPoint slides, verbal discussion, and written materials to support subject matter. The instructor gives an explanation and review of the Therapeutic Lifestyle Changes diet recommendations, which includes a discussion on lipid goals, dietary fat, sodium, fiber, plant stanol/sterol esters, sugar, and the components of a well-balanced, healthy diet. Flowsheet Row CARDIAC REHAB PHASE II EXERCISE from 10/28/2016 in [place]  Date  10/28/16  Educator  RD  Instruction Review Code  Not applicable [class handouts given]      Nutrition II class: Lifestyle Skills:  -Group instruction provided by PowerPoint slides, verbal discussion, and written materials to support subject matter. The instructor gives an explanation and review of label reading, grocery shopping for heart health, heart healthy recipe modifications, and ways to make healthier choices when eating out. Flowsheet Row CARDIAC REHAB PHASE II EXERCISE from 10/28/2016 in [place]  Date  10/28/16  Educator  RD  Instruction Review Code  Not applicable [class handouts given]      Diabetes Question & Answer:  -Group instruction provided by PowerPoint slides, verbal discussion, and written materials to support subject matter. The instructor gives an explanation and review of diabetes co-morbidities, pre- and post-prandial blood glucose goals, pre-exercise blood glucose goals, signs, symptoms, and treatment of hypoglycemia and hyperglycemia, and foot  care basics.   Diabetes Blitz:  -Group instruction provided by PowerPoint slides, verbal discussion, and written materials to support subject matter. The instructor gives an explanation and review of the physiology behind type 1 and type 2 diabetes, diabetes medications and rational behind using different medications, pre- and post-prandial blood glucose recommendations and Hemoglobin A1c goals, diabetes diet, and exercise including blood glucose guidelines for exercising safely.    Portion Distortion:  -Group instruction provided by PowerPoint slides, verbal discussion, written materials, and food models to support subject matter. The instructor gives an explanation of serving size versus portion size, changes in portions sizes over the last 20 years, and what consists of a serving from each food group.   Stress Management:  -Group instruction provided by verbal instruction, video, and written materials to support subject matter.  Instructors review role of stress in heart disease and how to cope with stress positively.     Exercising on Your Own:  -Group instruction provided by verbal instruction, power point, and written materials to support subject.  Instructors discuss benefits of exercise, components of exercise, frequency and intensity  of exercise, and end points for exercise.  Also discuss use of nitroglycerin and activating EMS.  Review options of places to exercise outside of rehab.  Review guidelines for sex with heart disease.   Cardiac Drugs I:  -Group instruction provided by verbal instruction and written materials to support subject.  Instructor reviews cardiac drug classes: antiplatelets, anticoagulants, beta blockers, and statins.  Instructor discusses reasons, side effects, and lifestyle considerations for each drug class.   Cardiac Drugs II:  -Group instruction provided by verbal instruction and written materials to support subject.  Instructor reviews cardiac drug classes:  angiotensin converting enzyme inhibitors (ACE-I), angiotensin II receptor blockers (ARBs), nitrates, and calcium channel blockers.  Instructor discusses reasons, side effects, and lifestyle considerations for each drug class.   Anatomy and Physiology of the Circulatory System:  -Group instruction provided by verbal instruction, video, and written materials to support subject.  Reviews functional anatomy of heart, how it relates to various diagnoses, and what role the heart plays in the overall system.   Knowledge Questionnaire Score:     Knowledge Questionnaire Score - 10/03/16 1530      Knowledge Questionnaire Score   Pre Score 20/24      Core Components/Risk Factors/Patient Goals at Admission:     Personal Goals and Risk Factors at Admission - 10/03/16 0928      Core Components/Risk Factors/Patient Goals on Admission    Weight Management Yes;Weight Loss   Intervention Weight Management: Develop a combined nutrition and exercise program designed to reach desired caloric intake, while maintaining appropriate intake of nutrient and fiber, sodium and fats, and appropriate energy expenditure required for the weight goal.;Weight Management: Provide education and appropriate resources to help participant work on and attain dietary goals.;Weight Management/Obesity: Establish reasonable short term and long term weight goals.;Obesity: Provide education and appropriate resources to help participant work on and attain dietary goals.   Expected Outcomes Short Term: Continue to assess and modify interventions until short term weight is achieved;Long Term: Adherence to nutrition and physical activity/exercise program aimed toward attainment of established weight goal;Weight Maintenance: Understanding of the daily nutrition guidelines, which includes 25-35% calories from fat, 7% or less cal from saturated fats, less than '200mg'$  cholesterol, less than 1.5gm of sodium, & 5 or more servings of fruits and  vegetables daily;Weight Loss: Understanding of general recommendations for a balanced deficit meal plan, which promotes 1-2 lb weight loss per week and includes a negative energy balance of [phone] kcal/d;Understanding recommendations for meals to include 15-35% energy as protein, 25-35% energy from fat, 35-60% energy from carbohydrates, less than '200mg'$  of dietary cholesterol, 20-35 gm of total fiber daily;Understanding of distribution of calorie intake throughout the day with the consumption of 4-5 meals/snacks   Sedentary Yes   Intervention Provide advice, education, support and counseling about physical activity/exercise needs.;Develop an individualized exercise prescription for aerobic and resistive training based on initial evaluation findings, risk stratification, comorbidities and participant's personal goals.   Expected Outcomes Achievement of increased cardiorespiratory fitness and enhanced flexibility, muscular endurance and strength shown through measurements of functional capacity and personal statement of participant.   Increase Strength and Stamina Yes   Intervention Provide advice, education, support and counseling about physical activity/exercise needs.;Develop an individualized exercise prescription for aerobic and resistive training based on initial evaluation findings, risk stratification, comorbidities and participant's personal goals.   Expected Outcomes Achievement of increased cardiorespiratory fitness and enhanced flexibility, muscular endurance and strength shown through measurements of functional capacity and personal statement of participant.  Improve shortness of breath with ADL's Yes   Intervention Provide education, individualized exercise plan and daily activity instruction to help decrease symptoms of SOB with activities of daily living.   Expected Outcomes Short Term: Achieves a reduction of symptoms when performing activities of daily living.   Personal Goal Other Yes    Personal Goal Get back to usual activities without fatigue and lose 5lbs   Intervention Provide nutrition and exercise programming to assist with weightloss and performing ADL's    Expected Outcomes Pt will be able to return to activities with less fatigue and lose 5lbs      Core Components/Risk Factors/Patient Goals Review:      Goals and Risk Factor Review    Row Name 11/04/16 1741 12/02/16 1729 01/01/17 1415         Core Components/Risk Factors/Patient Goals Review   Personal Goals Review Increase Strength and Stamina;Other Increase Strength and Stamina;Other Increase Strength and Stamina;Other     Review Pt wants to increase stamina and strength, encouraged pt to exercise at home outside of [facility] Feels like he's back to his normal self prior to his event.  Pt states he walks a lot at home.  Pt states he is "watching his eating" and exercises at home daily.  He states he feels like his "old self" prior to his cardiac event      Expected Outcomes Pt will improve cardiorespiratory fitness and acheive fitness goals  Continue to walk at home and increase workloads as tolerated in order to acheive fitness goal. Continue to walk at home and increase workloads as tolerated in order to acheive fitness goal.        Core Components/Risk Factors/Patient Goals at Discharge (Final Review):      Goals and Risk Factor Review - 01/01/17 1415      Core Components/Risk Factors/Patient Goals Review   Personal Goals Review Increase Strength and Stamina;Other   Review Pt states he is "watching his eating" and exercises at home daily.  He states he feels like his "old self" prior to his cardiac event    Expected Outcomes Continue to walk at home and increase workloads as tolerated in order to acheive fitness goal.      ITP Comments:     ITP Comments    Row Name 10/03/16 0926           ITP Comments Dr. Fransico Him, Medical Director          Comments: ED is making expected progress toward  personal goals after completing 30 sessions. Recommend continued exercise and life style modification education including  stress management and relaxation techniques to decrease cardiac risk profile. Barnet Pall, RN,BSN 01/02/2017 11:06 AM

## 2017-01-01 ENCOUNTER — Encounter (HOSPITAL_COMMUNITY)
Admission: RE | Admit: 2017-01-01 | Discharge: 2017-01-01 | Disposition: A | Discharge status: Home / Self Care | Payer: PPO | Source: Ambulatory Visit | Attending: Cardiology | Admitting: Cardiology

## 2017-01-01 DIAGNOSIS — Z87442 Personal history of urinary calculi: Secondary | ICD-10-CM | POA: Insufficient documentation

## 2017-01-01 DIAGNOSIS — I10 Essential (primary) hypertension: Secondary | ICD-10-CM | POA: Insufficient documentation

## 2017-01-01 DIAGNOSIS — Z85118 Personal history of other malignant neoplasm of bronchus and lung: Secondary | ICD-10-CM | POA: Diagnosis not present

## 2017-01-01 DIAGNOSIS — Z87891 Personal history of nicotine dependence: Secondary | ICD-10-CM | POA: Insufficient documentation

## 2017-01-01 DIAGNOSIS — Z85828 Personal history of other malignant neoplasm of skin: Secondary | ICD-10-CM | POA: Diagnosis not present

## 2017-01-01 DIAGNOSIS — M1990 Unspecified osteoarthritis, unspecified site: Secondary | ICD-10-CM | POA: Insufficient documentation

## 2017-01-01 DIAGNOSIS — I2510 Atherosclerotic heart disease of native coronary artery without angina pectoris: Secondary | ICD-10-CM | POA: Insufficient documentation

## 2017-01-01 DIAGNOSIS — E785 Hyperlipidemia, unspecified: Secondary | ICD-10-CM | POA: Diagnosis not present

## 2017-01-01 DIAGNOSIS — Z79899 Other long term (current) drug therapy: Secondary | ICD-10-CM | POA: Insufficient documentation

## 2017-01-01 DIAGNOSIS — Z951 Presence of aortocoronary bypass graft: Secondary | ICD-10-CM

## 2017-01-01 DIAGNOSIS — Z7982 Long term (current) use of aspirin: Secondary | ICD-10-CM | POA: Insufficient documentation

## 2017-01-02 ENCOUNTER — Ambulatory Visit
Admission: RE | Admit: 2017-01-02 | Discharge: 2017-01-02 | Disposition: A | Discharge status: Home / Self Care | Payer: PPO | Source: Ambulatory Visit | Attending: Cardiothoracic Surgery | Admitting: Cardiothoracic Surgery

## 2017-01-02 ENCOUNTER — Encounter: Payer: Self-pay | Admitting: Cardiothoracic Surgery

## 2017-01-02 ENCOUNTER — Ambulatory Visit: Payer: PPO

## 2017-01-02 ENCOUNTER — Ambulatory Visit (INDEPENDENT_AMBULATORY_CARE_PROVIDER_SITE_OTHER): Payer: PPO | Admitting: Cardiothoracic Surgery

## 2017-01-02 VITALS — BP 93/60 | HR 66 | Resp 16 | Ht 64.0 in | Wt 160.0 lb

## 2017-01-02 DIAGNOSIS — C3490 Malignant neoplasm of unspecified part of unspecified bronchus or lung: Secondary | ICD-10-CM

## 2017-01-02 DIAGNOSIS — Z85118 Personal history of other malignant neoplasm of bronchus and lung: Secondary | ICD-10-CM

## 2017-01-02 DIAGNOSIS — Z902 Acquired absence of lung [part of]: Secondary | ICD-10-CM | POA: Diagnosis not present

## 2017-01-02 DIAGNOSIS — C3412 Malignant neoplasm of upper lobe, left bronchus or lung: Secondary | ICD-10-CM | POA: Diagnosis not present

## 2017-01-02 NOTE — Progress Notes (Signed)
[address]       [place] [address]             [phone]      Kathryn H Kabir Baker Medical Record #[id number] Date of Birth: 01/22/1936  Referring: Corey Skains, MD Primary Care: Irven Shelling, MD  Chief Complaint:   POST OP FOLLOW UP 07/19/2016  OPERATIVE REPORT PREOPERATIVE DIAGNOSIS:  Three-vessel coronary artery disease with ischemic cardiomyopathy, ejection fraction 20%. POSTOPERATIVE DIAGNOSIS:  Three-vessel coronary artery disease with ischemic cardiomyopathy, ejection fraction 20%. Nonunion of the right sternoclavicular joint. PROCEDURE:  Coronary artery bypass grafting x4 with left internal mammary to the left anterior descending coronary artery, reverse saphenous vein graft to the diagonal coronary artery, reverse saphenous vein graft to the circumflex coronary artery, reverse saphenous vein graft to the posterior descending coronary artery with right thigh and calf greater saphenous endoscopic vein harvesting. Plating of right sternoclavicular joint SURGEON:  Lanelle Bal, MD.  06/19/2012  OPERATIVE REPORT  PREOPERATIVE DIAGNOSIS: Left upper lobe lung mass.  POSTOPERATIVE DIAGNOSES: Left upper lobe lung mass, squamous cell  carcinoma, non-small cell carcinoma by frozen section.  PROCEDURE PERFORMED: Video bronchoscopy, left video-assisted  thoracoscopy with mini thoracotomy, resection of left upper lobe and  portion of superior segment left lower lobe and lymph node dissection.   Stage IB, (pT2a,pN0,cM0 SQUAMOUS CELL CARCINOMA, 3.8 CM.)   History of Present Illness:      Patient making good progress following  artery bypass grafting in late July. He's also been followed for the past 5 years with a stage IB squamous cell are [facility] of the lung following left upper lobectomy. At the time of surgery he was noted to have significantly decreased LV function with ejection fraction approximately 15%. He notes a recent  echocardiogram this is increased to 45% He seems to be making steady progress in his physical activity, does not have any overt symptoms of congestive heart failure. [facility]. He is unaware of any episodes of atrial fibrillation.  Patient comes in today with a follow-up CT scan of the chest for his history of stage IB carcinoma of the lung.   Past Medical History:  Diagnosis Date  . Arthritis    JOINT PAIN RIGHT HAND  . BPH (benign prostatic hyperplasia)    [name]/elevated PSA, prostate biopsy x4 including one saturation biopsy, laser treatment 2/14; NOCTURIA  . CAD (coronary artery disease) 2013   cath 05/2012 showing 80-90% stenosis of a trifurcating diagonal #1, 70-80% stenosis of OM3 and 90% stenosis of distal LCx after OM3 - medical management, [place]  . Cardiomyopathy ([place])    dilated cardiomyopathy EF 30%, MUGA  EF 42% 08/2012  . Carotid artery occlusion    carotid artery bruit  . Chronic kidney disease    kidney stones -small passed.  . Diastolic dysfunction   . Heart murmur    as a child  . History of shingles   . Hyperlipidemia    statin intolerant  . Hypertension   . Lung mass    Stage 1B non-small cell lung CA s/p resection 2013  . PVC (premature ventricular contraction)   . Shingles   . Sigmoid diverticulosis   . Skin cancer    Multiple skin cancers   . Squamous cell carcinoma, face    history of right face with mets to right upper cheek in 2004  and facial lymph node reoccurence post surgery with XRT  .  Trigger finger    Bilateral     History  Smoking Status  . Former Smoker  . Types: Cigarettes  . Quit date: 12/30/1965  Smokeless Tobacco  . Never Used    History  Alcohol Use  . 3.0 - 3.6 oz/week  . 3 - 4 Glasses of wine, 2 Cans of beer per week    Comment: infrequently- "indulge heavly when I do"     Allergies  Allergen Reactions  . Ramipril Cough  . Statins     Confusion Muscle pain     Current  Outpatient Prescriptions  Medication Sig Dispense Refill  . Ascorbic Acid (VITAMIN C) 1000 MG tablet Take 1,000 mg by mouth daily.    Marland Kitchen aspirin 81 MG tablet Take 81 mg by mouth daily.    Marland Kitchen b complex vitamins tablet Take 1 tablet by mouth daily.    . carvedilol (COREG) 6.25 MG tablet Take 1 tablet (6.25 mg total) by mouth 2 (two) times daily. 60 tablet 3  . ezetimibe (ZETIA) 10 MG tablet Take 10 mg by mouth daily.    . Flaxseed, Linseed, (FLAXSEED OIL PO) Take by mouth.    Marland Kitchen GARLIC PO Take by mouth.    . losartan (COZAAR) 25 MG tablet Take 1 tablet (25 mg total) by mouth at bedtime. 90 tablet 3  . Multiple Vitamin (MULTIVITAMIN WITH MINERALS) TABS tablet Take 1 tablet by mouth daily.    . Omega 3 1200 MG CAPS Take 1 capsule by mouth 2 (two) times daily.     Marland Kitchen pyridoxine (B-6) 100 MG tablet Take 100 mg by mouth daily.    . Red Yeast Rice Extract 600 MG CAPS Take 1 capsule by mouth 2 (two) times daily.    Marland Kitchen spironolactone (ALDACTONE) 25 MG tablet Take 1 tablet (25 mg total) by mouth daily. 30 tablet 3   No current facility-administered medications for this visit.        Physical Exam: BP 93/60 (BP Location: Right Arm, Patient Position: Sitting, Cuff Size: Small)   Pulse 66   Resp 16   Ht '5\' 4"'$  (1.626 m)   Wt 160 lb (72.6 kg)   SpO2 95% Comment: ON RA  BMI 27.46 kg/m   General appearance: alert and cooperative Neurologic: intact Heart: regular rate and rhythm, S1, S2 normal, no murmur, click, rub or gallop Lungs: clear to auscultation bilaterally Abdomen: soft, non-tender; bowel sounds normal; no masses,  no organomegaly Extremities: extremities normal, atraumatic, no cyanosis or edema Wound: Sternum is stable and well healed, right leg vein harvest site is also well-healed   Diagnostic Studies & Laboratory data:     Recent Radiology Findings:   Ct Chest Wo Contrast  Result Date: 01/02/2017 CLINICAL DATA:  Stage IB squamous cell lung carcinoma of the left upper lobe status  post left upper lobectomy 06/16/2012, presenting for restaging. No current symptoms are reported. Remote smoking history. EXAM: CT CHEST WITHOUT CONTRAST TECHNIQUE: Multidetector CT imaging of the chest was performed following the standard protocol without IV contrast. COMPARISON:  07/18/2016 chest CT.  08/26/2016 chest radiograph. FINDINGS: Cardiovascular: Stable mild cardiomegaly. No significant pericardial fluid/thickening. Left main, left anterior descending, left circumflex and right coronary atherosclerosis status post CABG with ascending aortic and left internal mammary bypass grafts. Atherosclerotic nonaneurysmal thoracic aorta. Stable enlarged main pulmonary artery (3.5 cm diameter). Mediastinum/Nodes: No discrete thyroid nodules. Unremarkable esophagus. No pathologically enlarged axillary, mediastinal or gross hilar lymph nodes, noting limited sensitivity for the detection of hilar adenopathy  on this noncontrast study. Lungs/Pleura: No pneumothorax. No pleural effusion. Stable volume loss in the left hemithorax status post left upper lobectomy. Peripheral basilar right lower lobe 2 mm solid pulmonary nodule (series 4/ image 102) is stable back to 12/15/2014 and considered benign. Stable mild postsurgical scarring in the basilar left lower lobe. No acute consolidative airspace disease, lung masses or new significant pulmonary nodules. Upper abdomen: Simple 1.1 cm posterior upper right liver cyst. Clustered nonobstructing stones in the lower left kidney measuring up to 4 mm. Left colonic diverticulosis. Musculoskeletal: No aggressive appearing focal osseous lesions. Surgical plate with interlocking screws in the anterior right sternoclavicular joint. Sternotomy wires appear aligned and intact. Moderate thoracic spondylosis. Mild gynecomastia, asymmetric to the left, new. IMPRESSION: 1. No evidence of local tumor recurrence status post left upper lobectomy. 2. No evidence of metastatic disease in the chest.  3. New mild gynecomastia, asymmetric to the left. 4. Stable mild cardiomegaly. Stable dilated main pulmonary artery suggesting chronic pulmonary arterial hypertension. 5. Aortic atherosclerosis. Left main and 3 vessel coronary atherosclerosis status post CABG . 6. Nonobstructing lower left nephrolithiasis. Electronically Signed   By: Ilona Sorrel M.D.   On: 01/02/2017 11:49      Recent Lab Findings: Lab Results  Component Value Date   WBC 8.1 07/27/2016   HGB 8.6 (L) 07/27/2016   HCT 26.7 (L) 07/27/2016   PLT 305 07/27/2016   GLUCOSE 101 (H) 11/07/2016   CHOL 235 (H) 12/17/2016   TRIG 157 (H) 12/17/2016   HDL 75 12/17/2016   LDLDIRECT 147.6 11/08/2013   LDLCALC 129 (H) 12/17/2016   ALT 39 07/26/2016   AST 31 07/26/2016   NA 138 11/07/2016   K 4.5 11/07/2016   CL 106 11/07/2016   CREATININE 0.98 11/07/2016   BUN 28 (H) 11/07/2016   CO2 27 11/07/2016   TSH 1.645 07/22/2016   INR 1.50 (H) 07/19/2016   HGBA1C 5.8 (H) 07/17/2016      Assessment / Plan:    Patient stable after his coronary artery bypass grafting with severe LV dysfunction now symptomatically much improved, he's had no overt symptoms of congestive heart failure Follow-up CT of the chest today shows no evidence of recurrent malignancy in the chest, his pulmonary artery remains mildly dilated Plan to see him back in one year with a low-dose CT scan of the chest , since his Beverly Gust died of lung cancer he's very concerned about recurrence    Grace Isaac MD      [address] [place] [address] Office [phone]   Beeper [phone]  01/02/2017 1:47 PM

## 2017-01-03 ENCOUNTER — Encounter (HOSPITAL_COMMUNITY)
Admission: RE | Admit: 2017-01-03 | Discharge: 2017-01-03 | Disposition: A | Discharge status: Home / Self Care | Payer: PPO | Source: Ambulatory Visit | Attending: Cardiology | Admitting: Cardiology

## 2017-01-03 DIAGNOSIS — Z951 Presence of aortocoronary bypass graft: Secondary | ICD-10-CM

## 2017-01-06 ENCOUNTER — Encounter (HOSPITAL_COMMUNITY)
Admission: RE | Admit: 2017-01-06 | Discharge: 2017-01-06 | Disposition: A | Discharge status: Home / Self Care | Payer: PPO | Source: Ambulatory Visit | Attending: Cardiology | Admitting: Cardiology

## 2017-01-06 DIAGNOSIS — Z951 Presence of aortocoronary bypass graft: Secondary | ICD-10-CM

## 2017-01-07 ENCOUNTER — Ambulatory Visit: Payer: PPO

## 2017-01-08 ENCOUNTER — Encounter (HOSPITAL_COMMUNITY): Payer: PPO

## 2017-01-09 ENCOUNTER — Ambulatory Visit: Payer: PPO

## 2017-01-10 ENCOUNTER — Encounter (HOSPITAL_COMMUNITY)
Admission: RE | Admit: 2017-01-10 | Discharge: 2017-01-10 | Disposition: A | Discharge status: Home / Self Care | Payer: PPO | Source: Ambulatory Visit | Attending: Cardiology | Admitting: Cardiology

## 2017-01-10 DIAGNOSIS — Z951 Presence of aortocoronary bypass graft: Secondary | ICD-10-CM | POA: Diagnosis not present

## 2017-01-13 ENCOUNTER — Encounter (HOSPITAL_COMMUNITY)
Admission: RE | Admit: 2017-01-13 | Discharge: 2017-01-13 | Disposition: A | Discharge status: Home / Self Care | Payer: PPO | Source: Ambulatory Visit | Attending: Cardiology | Admitting: Cardiology

## 2017-01-13 VITALS — Wt 160.5 lb

## 2017-01-13 DIAGNOSIS — Z951 Presence of aortocoronary bypass graft: Secondary | ICD-10-CM | POA: Diagnosis not present

## 2017-01-13 NOTE — Progress Notes (Signed)
Discharge Summary  Patient Details  Name: [name] [name] MRN: [id number] Date of Birth: [birth year] Referring Provider:   Flowsheet Row CARDIAC REHAB PHASE II ORIENTATION from 10/03/2016 in [facility]  Referring Provider  Loralie Champagne MD       Number of Visits: 36  Reason for Discharge:  Patient independent in their exercise.  Smoking History:  History  Smoking Status  . Former Smoker  . Types: Cigarettes  . Quit date: 12/30/1965  Smokeless Tobacco  . Never Used    Diagnosis:  07/19/16 S/P CABG x 4  ADL UCSD:   Initial Exercise Prescription:     Initial Exercise Prescription - 10/03/16 1500      Date of Initial Exercise RX and Referring Provider   Date 10/03/16   Referring Provider Loralie Champagne MD     Treadmill   MPH 2   Grade 0   Minutes 10   METs 2.53     NuStep   Level 3   Watts --   Minutes 10   METs 2     Cybex   Level 2  upright scifit   Minutes 10   METs 2     REL-XR   Level --   Watts --   Minutes --   METs --     Prescription Details   Frequency (times per week) 2     Intensity   THRR 40-80% of Max Heartrate 95-126   Ratings of Perceived Exertion 11-15   Perceived Dyspnea 0-4     Progression   Progression Continue to progress workloads to maintain intensity without signs/symptoms of physical distress.     Resistance Training   Training Prescription Yes   Weight 2lbs   Reps 10-12      Discharge Exercise Prescription (Final Exercise Prescription Changes):     Exercise Prescription Changes - 01/20/17 1100      Exercise Review   Progression Yes     Response to Exercise   Blood Pressure (Admit) 118/64   Blood Pressure (Exercise) 122/64   Blood Pressure (Exit) 104/68   Heart Rate (Admit) 81 bpm   Heart Rate (Exercise) 110 bpm   Heart Rate (Exit) 73 bpm   Rating of Perceived Exertion (Exercise) 12   Duration Progress to 45 minutes of aerobic exercise without signs/symptoms of physical  distress   Intensity THRR unchanged     Progression   Progression Continue to progress workloads to maintain intensity without signs/symptoms of physical distress.   Average METs 4.1     Resistance Training   Training Prescription Yes   Weight 3lbs   Reps 10-12     Treadmill   MPH 2.5   Grade 2   Minutes 10   METs 3.6     NuStep   Level 5   Minutes 10   METs 3.8     Cybex   Level 2  upright scifit   Minutes 10   METs 5     Home Exercise Plan   Plans to continue exercise at Home   Frequency Add 2 additional days to program exercise sessions.      Functional Capacity:     6 Minute Walk    Row Name 10/03/16 1529 01/20/17 1142       6 Minute Walk   Phase Initial Discharge    Distance 1364 feet 1715 feet    Distance % Change  - 25.7 %    Walk Time 6  minutes 6 minutes    # of Rest Breaks 0 0    MPH 2.58 3.2    METS 2.51 3    RPE 11 11    Perceived Dyspnea   - 0    VO2 Peak 8.79 10.6    Symptoms No No    Resting HR 70 bpm 66 bpm    Resting BP 102/60 114/66    Max Ex. HR 104 bpm 94 bpm    Max Ex. BP 128/60 136/74    2 Minute Post BP 108/56 94/64       Psychological, QOL, Others - Outcomes: PHQ 2/9: Depression screen [facility] 2/9 01/13/2017 10/16/2016 09/19/2016 08/07/2016  Decreased Interest 0 0 0 0  Down, Depressed, Hopeless 0 0 0 0  PHQ - 2 Score 0 0 0 0  Altered sleeping - - 1 -  Tired, decreased energy - - 1 -  Change in appetite - - 0 -  Feeling bad or failure about yourself  - - 0 -  Trouble concentrating - - 0 -  Moving slowly or fidgety/restless - - 1 -  Suicidal thoughts - - 0 -  PHQ-9 Score - - 3 -  Difficult doing work/chores - - Not difficult at all -    Quality of Life:     Quality of Life - 01/20/17 1153      Quality of Life Scores   Health/Function Pre 24.47 %   Health/Function Post 27.3 %   Health/Function % Change 11.57 %   Socioeconomic Pre 27.5 %   Socioeconomic Post 28.21 %   Socioeconomic % Change  2.58 %   Psych/Spiritual  Pre 30 %   Psych/Spiritual Post 30 %   Psych/Spiritual % Change 0 %   Family Pre 26.4 %   Family Post 27.6 %   Family % Change 4.55 %   GLOBAL Pre 26.51 %   GLOBAL Post 28.09 %   GLOBAL % Change 5.96 %      Personal Goals: Goals established at orientation with interventions provided to work toward goal.     Personal Goals and Risk Factors at Admission - 10/03/16 0928      Core Components/Risk Factors/Patient Goals on Admission    Weight Management Yes;Weight Loss   Intervention Weight Management: Develop a combined nutrition and exercise program designed to reach desired caloric intake, while maintaining appropriate intake of nutrient and fiber, sodium and fats, and appropriate energy expenditure required for the weight goal.;Weight Management: Provide education and appropriate resources to help participant work on and attain dietary goals.;Weight Management/Obesity: Establish reasonable short term and long term weight goals.;Obesity: Provide education and appropriate resources to help participant work on and attain dietary goals.   Expected Outcomes Short Term: Continue to assess and modify interventions until short term weight is achieved;Long Term: Adherence to nutrition and physical activity/exercise program aimed toward attainment of established weight goal;Weight Maintenance: Understanding of the daily nutrition guidelines, which includes 25-35% calories from fat, 7% or less cal from saturated fats, less than 237m cholesterol, less than 1.5gm of sodium, & 5 or more servings of fruits and vegetables daily;Weight Loss: Understanding of general recommendations for a balanced deficit meal plan, which promotes 1-2 lb weight loss per week and includes a negative energy balance of [phone] kcal/d;Understanding recommendations for meals to include 15-35% energy as protein, 25-35% energy from fat, 35-60% energy from carbohydrates, less than 2017mof dietary cholesterol, 20-35 gm of total fiber  daily;Understanding of distribution of calorie intake  throughout the day with the consumption of 4-5 meals/snacks   Sedentary Yes   Intervention Provide advice, education, support and counseling about physical activity/exercise needs.;Develop an individualized exercise prescription for aerobic and resistive training based on initial evaluation findings, risk stratification, comorbidities and participant's personal goals.   Expected Outcomes Achievement of increased cardiorespiratory fitness and enhanced flexibility, muscular endurance and strength shown through measurements of functional capacity and personal statement of participant.   Increase Strength and Stamina Yes   Intervention Provide advice, education, support and counseling about physical activity/exercise needs.;Develop an individualized exercise prescription for aerobic and resistive training based on initial evaluation findings, risk stratification, comorbidities and participant's personal goals.   Expected Outcomes Achievement of increased cardiorespiratory fitness and enhanced flexibility, muscular endurance and strength shown through measurements of functional capacity and personal statement of participant.   Improve shortness of breath with ADL's Yes   Intervention Provide education, individualized exercise plan and daily activity instruction to help decrease symptoms of SOB with activities of daily living.   Expected Outcomes Short Term: Achieves a reduction of symptoms when performing activities of daily living.   Personal Goal Other Yes   Personal Goal Get back to usual activities without fatigue and lose 5lbs   Intervention Provide nutrition and exercise programming to assist with weightloss and performing ADL's    Expected Outcomes Pt will be able to return to activities with less fatigue and lose 5lbs       Personal Goals Discharge:     Goals and Risk Factor Review    Row Name 11/04/16 1741 12/02/16 1729 01/01/17 1415  01/20/17 1549       Core Components/Risk Factors/Patient Goals Review   Personal Goals Review Increase Strength and Stamina;Other Increase Strength and Stamina;Other Increase Strength and Stamina;Other Weight Management/Obesity    Review Pt wants to increase stamina and strength, encouraged pt to exercise at home outside of [facility] Feels like he's back to his normal self prior to his event.  Pt states he walks a lot at home.  Pt states he is "watching his eating" and exercises at home daily.  He states he feels like his "old self" prior to his cardiac event  Pt has gained 5.1 lb    Expected Outcomes Pt will improve cardiorespiratory fitness and acheive fitness goals  Continue to walk at home and increase workloads as tolerated in order to acheive fitness goal. Continue to walk at home and increase workloads as tolerated in order to acheive fitness goal. Continue heart healthy diet and exercise lifestyle changes to help promote slow wt loss.       Nutrition & Weight - Outcomes:     Pre Biometrics - 10/03/16 1521      Pre Biometrics   Weight 155 lb 6.8 oz (70.5 kg)   Hip Circumference 39.5 inches   Grip Strength 34 kg   Flexibility 0 in   Single Leg Stand 30 seconds         Post Biometrics - 01/20/17 1149       Post  Biometrics   Weight 160 lb 7.9 oz (72.8 kg)   Waist Circumference 39 inches   Hip Circumference 40.5 inches   Waist to Hip Ratio 0.96 %   Triceps Skinfold 15 mm   % Body Fat 28.3 %   Grip Strength 39 kg   Flexibility 0 in   Single Leg Stand 30 seconds      Nutrition:     Nutrition Therapy & Goals - 10/28/16  1031      Nutrition Therapy   Diet Therapeutic Lifestyle Changes     Personal Nutrition Goals   Personal Goal #1 1-2 lb wt loss/week to a wt loss goal of 6-20 lb at graduation from [place], educate and counsel regarding individualized specific dietary modifications aiming towards targeted core  components such as weight, hypertension, lipid management, diabetes, heart failure and other comorbidities.   Expected Outcomes Short Term Goal: Understand basic principles of dietary content, such as calories, fat, sodium, cholesterol and nutrients.;Long Term Goal: Adherence to prescribed nutrition plan.      Nutrition Discharge:     Nutrition Assessments - 01/20/17 1548      MEDFICTS Scores   Pre Score 36   Post Score 12   Score Difference -24      Education Questionnaire Score:     Knowledge Questionnaire Score - 01/20/17 1141      Knowledge Questionnaire Score   Post Score 21/24      Goals reviewed with patient; copy given to patient.Ed graduates from cardiac rehab program on Wednesday with completion of 36 exercise sessions in Phase II. Pt maintained good attendance and progressed nicely during his participation in rehab as evidenced by increased MET level.   Medication list reconciled. Repeat  PHQ score-0  .  Pt has made significant lifestyle changes and should be commended for his success. Pt feels he has achieved his goals during cardiac rehab.   Ed plans to continue exercise by using his stationary bike at home.Barnet Pall, RN,BSN 01/23/2017 3:01 PM

## 2017-01-14 ENCOUNTER — Ambulatory Visit: Payer: PPO

## 2017-01-15 ENCOUNTER — Encounter (HOSPITAL_COMMUNITY): Payer: PPO

## 2017-01-16 ENCOUNTER — Ambulatory Visit: Payer: PPO

## 2017-01-17 ENCOUNTER — Encounter (HOSPITAL_COMMUNITY)
Admission: RE | Admit: 2017-01-17 | Discharge: 2017-01-17 | Disposition: A | Discharge status: Home / Self Care | Payer: PPO | Source: Ambulatory Visit | Attending: Cardiology | Admitting: Cardiology

## 2017-01-17 DIAGNOSIS — Z951 Presence of aortocoronary bypass graft: Secondary | ICD-10-CM | POA: Diagnosis not present

## 2017-01-20 ENCOUNTER — Encounter (HOSPITAL_COMMUNITY): Payer: PPO

## 2017-01-22 ENCOUNTER — Encounter (HOSPITAL_COMMUNITY): Payer: PPO

## 2017-01-23 ENCOUNTER — Other Ambulatory Visit (HOSPITAL_COMMUNITY): Payer: Self-pay | Admitting: Cardiology

## 2017-01-23 ENCOUNTER — Ambulatory Visit: Payer: PPO | Admitting: Cardiothoracic Surgery

## 2017-01-24 ENCOUNTER — Encounter (HOSPITAL_COMMUNITY): Payer: PPO

## 2017-01-27 ENCOUNTER — Encounter (HOSPITAL_COMMUNITY): Payer: PPO

## 2017-01-29 ENCOUNTER — Encounter (HOSPITAL_COMMUNITY): Payer: PPO

## 2017-01-31 ENCOUNTER — Encounter (HOSPITAL_COMMUNITY): Payer: PPO

## 2017-02-03 ENCOUNTER — Encounter (HOSPITAL_COMMUNITY): Payer: PPO

## 2017-02-05 ENCOUNTER — Encounter (HOSPITAL_COMMUNITY): Payer: PPO

## 2017-02-07 ENCOUNTER — Encounter (HOSPITAL_COMMUNITY): Payer: PPO

## 2017-02-10 ENCOUNTER — Encounter (HOSPITAL_COMMUNITY): Payer: PPO

## 2017-02-19 ENCOUNTER — Other Ambulatory Visit (HOSPITAL_COMMUNITY): Payer: Self-pay | Admitting: Cardiology

## 2017-03-12 DIAGNOSIS — L814 Other melanin hyperpigmentation: Secondary | ICD-10-CM | POA: Diagnosis not present

## 2017-03-12 DIAGNOSIS — Z85828 Personal history of other malignant neoplasm of skin: Secondary | ICD-10-CM | POA: Diagnosis not present

## 2017-03-12 DIAGNOSIS — L821 Other seborrheic keratosis: Secondary | ICD-10-CM | POA: Diagnosis not present

## 2017-03-12 DIAGNOSIS — D485 Neoplasm of uncertain behavior of skin: Secondary | ICD-10-CM | POA: Diagnosis not present

## 2017-03-12 DIAGNOSIS — L570 Actinic keratosis: Secondary | ICD-10-CM | POA: Diagnosis not present

## 2017-03-17 ENCOUNTER — Ambulatory Visit (HOSPITAL_COMMUNITY)
Admission: RE | Admit: 2017-03-17 | Discharge: 2017-03-17 | Disposition: A | Discharge status: Home / Self Care | Payer: PPO | Source: Ambulatory Visit | Attending: Cardiology | Admitting: Cardiology

## 2017-03-17 ENCOUNTER — Encounter (HOSPITAL_COMMUNITY): Payer: Self-pay | Admitting: *Deleted

## 2017-03-17 ENCOUNTER — Encounter (HOSPITAL_COMMUNITY): Payer: Self-pay

## 2017-03-17 VITALS — BP 101/56 | HR 74 | Wt 163.5 lb

## 2017-03-17 DIAGNOSIS — N400 Enlarged prostate without lower urinary tract symptoms: Secondary | ICD-10-CM | POA: Insufficient documentation

## 2017-03-17 DIAGNOSIS — Z85118 Personal history of other malignant neoplasm of bronchus and lung: Secondary | ICD-10-CM | POA: Insufficient documentation

## 2017-03-17 DIAGNOSIS — I2510 Atherosclerotic heart disease of native coronary artery without angina pectoris: Secondary | ICD-10-CM | POA: Diagnosis not present

## 2017-03-17 DIAGNOSIS — Z951 Presence of aortocoronary bypass graft: Secondary | ICD-10-CM | POA: Diagnosis not present

## 2017-03-17 DIAGNOSIS — E785 Hyperlipidemia, unspecified: Secondary | ICD-10-CM | POA: Insufficient documentation

## 2017-03-17 DIAGNOSIS — I519 Heart disease, unspecified: Secondary | ICD-10-CM

## 2017-03-17 DIAGNOSIS — I5189 Other ill-defined heart diseases: Secondary | ICD-10-CM

## 2017-03-17 DIAGNOSIS — Z87891 Personal history of nicotine dependence: Secondary | ICD-10-CM | POA: Insufficient documentation

## 2017-03-17 DIAGNOSIS — I5022 Chronic systolic (congestive) heart failure: Secondary | ICD-10-CM | POA: Insufficient documentation

## 2017-03-17 DIAGNOSIS — Z9889 Other specified postprocedural states: Secondary | ICD-10-CM | POA: Insufficient documentation

## 2017-03-17 DIAGNOSIS — N189 Chronic kidney disease, unspecified: Secondary | ICD-10-CM | POA: Diagnosis not present

## 2017-03-17 DIAGNOSIS — Z7982 Long term (current) use of aspirin: Secondary | ICD-10-CM | POA: Insufficient documentation

## 2017-03-17 DIAGNOSIS — I255 Ischemic cardiomyopathy: Secondary | ICD-10-CM | POA: Diagnosis not present

## 2017-03-17 DIAGNOSIS — Z85828 Personal history of other malignant neoplasm of skin: Secondary | ICD-10-CM | POA: Diagnosis not present

## 2017-03-17 DIAGNOSIS — I4891 Unspecified atrial fibrillation: Secondary | ICD-10-CM | POA: Diagnosis not present

## 2017-03-17 LAB — LIPID PANEL
Cholesterol: 157 mg/dL (ref 0–200)
HDL: 61 mg/dL (ref >40)
LDL Cholesterol: 75 mg/dL (ref 0–99)
Total CHOL/HDL Ratio: 2.6 ratio
Triglycerides: 106 mg/dL (ref <150)
VLDL: 21 mg/dL (ref 0–40)

## 2017-03-17 LAB — BASIC METABOLIC PANEL
Anion gap: 8 (ref 5–15)
BUN: 27 mg/dL — AB (ref 6–20)
CO2: 26 mmol/L (ref 22–32)
Calcium: 9.3 mg/dL (ref 8.9–10.3)
Chloride: 103 mmol/L (ref 101–111)
Creatinine, Ser: 1.11 mg/dL (ref 0.61–1.24)
GFR calc Af Amer: >60 mL/min (ref >60)
GLUCOSE: 164 mg/dL — AB (ref 65–99)
POTASSIUM: 4 mmol/L (ref 3.5–5.1)
Sodium: 137 mmol/L (ref 135–145)

## 2017-03-17 NOTE — Patient Instructions (Signed)
Labs today  We will contact you in 6 months to schedule your next appointment.  

## 2017-03-17 NOTE — Progress Notes (Signed)
PCP: Dr Lavone Orn HF Cardiology: Dr. Aundra Dubin Cardiac surgery: Dr. Servando Snare  81 yo with history of CAD s/p CABG, ischemic cardiomyopathy, and lung cancer s/p resection in 2013 presents for cardiology followup after recent hospitalization for CABG.  He was followed in the past by Dr Radford Pax.  EF was noted to be 35% in 9/16 by cMRI.  He had a high risk stress test in 6/17 that was followed by cath showing 3 VD.  He had CABG x 4 on 07/19/16.  TEE at the time of CABG showed EF 15%.    Post-op, he was initially hypotensive but this resolved.  He was markedly volume overloaded and was diuresed extensively.  He went home from the hospital on Lasix 60 mg po bid and weight continued to drop.  Lasix was stopped with rise in creatinine. Weight has stayed stable.  He is feeling good overall.  Doing cardiac rehab.  No dyspnea walking on flat ground, mild dyspnea walking up a hill.  No lightheadedness. No chest pain. Repeat echo (11/17) showed EF up to 45%.   Labs (07/27/16): K 4.1, creatinine 1.14 Labs (08/08/16): K 5.4, creatinine 1.72 Labs (08/12/16): K 4.8 => 4.3, creatinine 1.12 => 1.71, digoxin 1.6 => 0.3 Labs (9/17): LDL 110, HDL 46, digoxin < 0.2, K 4.7, creatinine 1.03, BNP 142 Labs (11/17): K 4.5, creatinine 0.98  PMH: 1. CAD: CABG (07/19/16) with LIMA-LAD, SVG-D, SVG-OM, SVG-PDA following high risk stress Cardiolite.  2. Chronic systolic CHF: Ischemic cardiomyopathy.  - Cardiac MRI (9/16) with EF 35%, normal RV.  - Cardiolite (6/17) with EF 21%.  - TEE (7/17, in OR) with EF 15%.  - Echo (11/17) with EF 45%, diffuse hypokinesis, normal RV size and systolic function, PASP 22 mmHg.  3. H/o non-small cell lung CA, s/p resection in 2013. 4. BPH 5. PVCs 6. Atrial fibrillation: Noted only post-op in 7/17.  7. Skin cancers 8. Hyperlipidemia: Muscle pain and confusion with statin use.   Social History   Social History  . Marital status: Married    Spouse name: N/A  . Number of children: N/A  . Years  of education: N/A   Occupational History  . Not on file.   Social History Main Topics  . Smoking status: Former Smoker    Types: Cigarettes    Quit date: 12/30/1965  . Smokeless tobacco: Never Used  . Alcohol use 3.0 - 3.6 oz/week    3 - 4 Glasses of wine, 2 Cans of beer per week     Comment: infrequently- "indulge heavly when I do"  . Drug use: No  . Sexual activity: Not on file   Other Topics Concern  . Not on file   Social History Narrative  . No narrative on file   Family History  Problem Relation Age of Onset  . Diabetes Father   . Psoriasis Child   . Heart disease Child     resuscitated from cardiac arrest from Brugada's syndrome  . Lung cancer Mother   . Heart disease Mother    ROS: All systems reviewed and negative except as per HPI.   Current Outpatient Prescriptions  Medication Sig Dispense Refill  . Ascorbic Acid (VITAMIN C) 1000 MG tablet Take 1,000 mg by mouth daily.    Marland Kitchen aspirin 81 MG tablet Take 81 mg by mouth daily.    Marland Kitchen b complex vitamins tablet Take 1 tablet by mouth daily.    . carvedilol (COREG) 6.25 MG tablet TAKE 1 TABLET (6.25 MG  TOTAL) BY MOUTH 2 (TWO) TIMES DAILY. 60 tablet 3  . ezetimibe (ZETIA) 10 MG tablet Take 10 mg by mouth daily.    . Flaxseed, Linseed, (FLAXSEED OIL PO) Take by mouth.    Marland Kitchen GARLIC PO Take by mouth.    . losartan (COZAAR) 25 MG tablet Take 1 tablet (25 mg total) by mouth at bedtime. 90 tablet 3  . Multiple Vitamin (MULTIVITAMIN WITH MINERALS) TABS tablet Take 1 tablet by mouth daily.    . Omega 3 1200 MG CAPS Take 1 capsule by mouth 2 (two) times daily.     Marland Kitchen pyridoxine (B-6) 100 MG tablet Take 100 mg by mouth daily.    . Red Yeast Rice Extract 600 MG CAPS Take 1 capsule by mouth 2 (two) times daily.    Marland Kitchen spironolactone (ALDACTONE) 25 MG tablet TAKE 1 TABLET BY MOUTH DAILY 30 tablet 3   No current facility-administered medications for this encounter.    BP (!) 101/56   Pulse 74   Wt 163 lb 8 oz (74.2 kg)   SpO2 100%    BMI 28.06 kg/m  General: NAD Neck: JVP 7 cm, no thyromegaly or thyroid nodule.  Lungs: CTAB CV: Nondisplaced PMI.  Heart regular S1/S2, no S3/S4, no murmur.  No peripheral edema.  No carotid bruit.  Normal pedal pulses.  Abdomen: Soft, nontender, no hepatosplenomegaly, no distention.  Skin: Intact without lesions or rashes.  Neurologic: Alert and oriented x 3.  Psych: Normal affect. Extremities: No clubbing or cyanosis.  HEENT: Normal.   Assessment/Plan: 1. CAD: s/p CABG.  No ischemic chest pain.  - Continue ASA 81 and Zetia (has not been able to tolerate statins). 2. Hyperlipidemia: He is now on Zetia and red yeast rice extract.  I thought he should be on a PCSK9-inhibitor, but he wanted to try red yeast rice extract and garlic first.  I will get lipids today.  If LDL is not at least < 100, I will recommend that he get on a PCSK9-inhibitor.   3. Chronic systolic CHF: Ischemic cardiomyopathy: EF 15% on TEE in 7/17 but improved in 11/17 to 45%.  He was markedly volume overloaded in the hospital but appears euvolemic now.   - Continue current Coreg, losartan, and spironolactone.  He does not have BP room for uptitration.     - Lasix only prn.  4. CKD: Improved off Lasix, BMET today.  5. Atrial fibrillation: Noted only post-op CABG.  Now off amiodarone.  If he has a recurrence, will need anticoagulation.    Followup in 6 months.   Loralie Champagne 03/17/2017

## 2017-03-20 ENCOUNTER — Encounter (HOSPITAL_COMMUNITY): Payer: Self-pay | Admitting: *Deleted

## 2017-04-17 ENCOUNTER — Encounter: Payer: Self-pay | Admitting: *Deleted

## 2017-04-21 ENCOUNTER — Telehealth: Payer: Self-pay | Admitting: Oncology

## 2017-04-21 ENCOUNTER — Ambulatory Visit (HOSPITAL_BASED_OUTPATIENT_CLINIC_OR_DEPARTMENT_OTHER): Payer: PPO | Admitting: Oncology

## 2017-04-21 VITALS — BP 88/51 | HR 69 | Temp 98.0°F | Resp 18 | Ht 64.0 in | Wt 167.8 lb

## 2017-04-21 DIAGNOSIS — Z85828 Personal history of other malignant neoplasm of skin: Secondary | ICD-10-CM

## 2017-04-21 DIAGNOSIS — C3412 Malignant neoplasm of upper lobe, left bronchus or lung: Secondary | ICD-10-CM

## 2017-04-21 NOTE — Telephone Encounter (Signed)
Per 4/23 los. Gave patient calender and AVS

## 2017-04-21 NOTE — Progress Notes (Signed)
  [facility] OFFICE PROGRESS NOTE   Diagnosis: Non-small cell lung cancer  INTERVAL HISTORY:   Mr.Son returns as scheduled. He feels well. No dyspnea. Good appetite. A CT the chest 01/02/2017 revealed no evidence of recurrent tumor in the chest. No evidence of metastatic disease. He underwent coronary artery bypass surgery in July 2017.   Objective:  Vital signs in last 24 hours:  Blood pressure (!) 88/51, pulse 69, temperature 98 F (36.7 C), temperature source Oral, resp. rate 18, height '5\' 4"'$  (1.626 m), weight 167 lb 12.8 oz (76.1 kg), SpO2 98 %.    HEENT: Neck without mass, oral cavity without visible mass Lymphatics: No cervical, supraclavicular, or axillary nodes Resp: Lungs clear bilaterally Cardio: Regular rate and rhythm GI: No hepatosplenomegaly Vascular: No leg edema  Skin: No evidence of recurrent tumor at the right face scar   Lab Results:  Lab Results  Component Value Date   WBC 8.1 07/27/2016   HGB 8.6 (L) 07/27/2016   HCT 26.7 (L) 07/27/2016   MCV 94.0 07/27/2016   PLT 305 07/27/2016     Medications: I have reviewed the patient's current medications.  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 Local recurrence of squamous cell carcinoma and a right face lymph node in 2006, status post surgical excision followed by radiation  3. Left hilar fullness on a chest x-ray 02/26/2012-stable compared to a chest x-ray from February of 2012 and new compared to a chest x-ray 2007 . A chest CT on 05/05/2012 confirmed a left upper lung nodule consistent with a primary bronchogenic carcinoma. A PET scan on 05/18/12 confirmed a hypermetabolic left upper lung nodule with no evidence of thoracic nodal or extrathoracic hypermetabolic disease  -He is status post a left upper lobectomy on 06/16/2012 with the pathology confirming a 3.8 cm squamous cell carcinoma (T2a, N0)  -Restaging CT 06/16/2014  without evidence of recurrent disease  -Restaging CT 12/15/2014 without evidence of recurrent disease -Restaging CT 12/06/2015-no evidence of recurrent disease -Restaging chest CT 01/02/2017-no evidence of recurrent disease 4. History of BPH , status post a laser vaporization of the prostate in February 2014 and TUR August 2015  5. History of multiple skin cancers including basal cell carcinoma and squamous cell carcinomas  6. coronary artery disease, decreased left ventricular ejection , Status post coronary artery bypass surgery July 2017    Disposition:  He remains in clinical remission from non-small cell lung cancer and skin cancer. He will continue surveillance CT follow-up with Dr. Servando Snare. He would like to continue follow-up at the Cancer center. Mr.Snodgrass will return for an office visit in one year.  15 minutes were spent with the patient today. The majority of the time was used for counseling and coordination of care.   Betsy Coder, MD  04/21/2017  1:36 PM

## 2017-05-05 ENCOUNTER — Other Ambulatory Visit (HOSPITAL_COMMUNITY): Payer: Self-pay | Admitting: Cardiology

## 2017-05-05 MED ORDER — CARVEDILOL 6.25 MG PO TABS: 6.2500 mg | ORAL_TABLET | Freq: Two times a day (BID) | ORAL | 3 refills | Status: DC

## 2017-05-08 DIAGNOSIS — Z1389 Encounter for screening for other disorder: Secondary | ICD-10-CM | POA: Diagnosis not present

## 2017-05-08 DIAGNOSIS — I25810 Atherosclerosis of coronary artery bypass graft(s) without angina pectoris: Secondary | ICD-10-CM | POA: Diagnosis not present

## 2017-05-08 DIAGNOSIS — Z85118 Personal history of other malignant neoplasm of bronchus and lung: Secondary | ICD-10-CM | POA: Diagnosis not present

## 2017-05-08 DIAGNOSIS — I480 Paroxysmal atrial fibrillation: Secondary | ICD-10-CM | POA: Diagnosis not present

## 2017-05-08 DIAGNOSIS — E7800 Pure hypercholesterolemia, unspecified: Secondary | ICD-10-CM | POA: Diagnosis not present

## 2017-05-08 DIAGNOSIS — I255 Ischemic cardiomyopathy: Secondary | ICD-10-CM | POA: Diagnosis not present

## 2017-05-08 DIAGNOSIS — R7301 Impaired fasting glucose: Secondary | ICD-10-CM | POA: Diagnosis not present

## 2017-05-08 DIAGNOSIS — Z0000 Encounter for general adult medical examination without abnormal findings: Secondary | ICD-10-CM | POA: Diagnosis not present

## 2017-05-08 DIAGNOSIS — I10 Essential (primary) hypertension: Secondary | ICD-10-CM | POA: Diagnosis not present

## 2017-05-31 ENCOUNTER — Other Ambulatory Visit (HOSPITAL_COMMUNITY): Payer: Self-pay | Admitting: Cardiology

## 2017-07-16 DIAGNOSIS — C44309 Unspecified malignant neoplasm of skin of other parts of face: Secondary | ICD-10-CM | POA: Diagnosis not present

## 2017-07-16 DIAGNOSIS — L821 Other seborrheic keratosis: Secondary | ICD-10-CM | POA: Diagnosis not present

## 2017-07-16 DIAGNOSIS — Z85828 Personal history of other malignant neoplasm of skin: Secondary | ICD-10-CM | POA: Diagnosis not present

## 2017-07-16 DIAGNOSIS — D485 Neoplasm of uncertain behavior of skin: Secondary | ICD-10-CM | POA: Diagnosis not present

## 2017-07-16 DIAGNOSIS — L570 Actinic keratosis: Secondary | ICD-10-CM | POA: Diagnosis not present

## 2017-07-16 DIAGNOSIS — C490 Malignant neoplasm of connective and soft tissue of head, face and neck: Secondary | ICD-10-CM | POA: Diagnosis not present

## 2017-07-16 DIAGNOSIS — L820 Inflamed seborrheic keratosis: Secondary | ICD-10-CM | POA: Diagnosis not present

## 2017-08-14 DIAGNOSIS — Z85828 Personal history of other malignant neoplasm of skin: Secondary | ICD-10-CM | POA: Diagnosis not present

## 2017-08-14 DIAGNOSIS — C44229 Squamous cell carcinoma of skin of left ear and external auricular canal: Secondary | ICD-10-CM | POA: Diagnosis not present

## 2017-09-25 DIAGNOSIS — L570 Actinic keratosis: Secondary | ICD-10-CM | POA: Diagnosis not present

## 2017-09-25 DIAGNOSIS — L820 Inflamed seborrheic keratosis: Secondary | ICD-10-CM | POA: Diagnosis not present

## 2017-09-25 DIAGNOSIS — L738 Other specified follicular disorders: Secondary | ICD-10-CM | POA: Diagnosis not present

## 2017-09-25 DIAGNOSIS — Z85828 Personal history of other malignant neoplasm of skin: Secondary | ICD-10-CM | POA: Diagnosis not present

## 2017-09-25 DIAGNOSIS — L821 Other seborrheic keratosis: Secondary | ICD-10-CM | POA: Diagnosis not present

## 2017-10-09 DIAGNOSIS — N401 Enlarged prostate with lower urinary tract symptoms: Secondary | ICD-10-CM | POA: Diagnosis not present

## 2017-10-09 DIAGNOSIS — R32 Unspecified urinary incontinence: Secondary | ICD-10-CM | POA: Diagnosis not present

## 2017-10-09 DIAGNOSIS — N35011 Post-traumatic bulbous urethral stricture: Secondary | ICD-10-CM | POA: Diagnosis not present

## 2017-10-26 ENCOUNTER — Other Ambulatory Visit (HOSPITAL_COMMUNITY): Payer: Self-pay | Admitting: Cardiology

## 2017-11-02 IMAGING — CR DG CHEST 2V
2 series · 2 of 2 positions shown · non-contrast
Comparison: CT chest of 12/06/2015 and chest x-ray of 05/26/2014

CLINICAL DATA: Preop for CABG, former smoking history

EXAM:
CHEST  2 VIEW

[w chest pa]
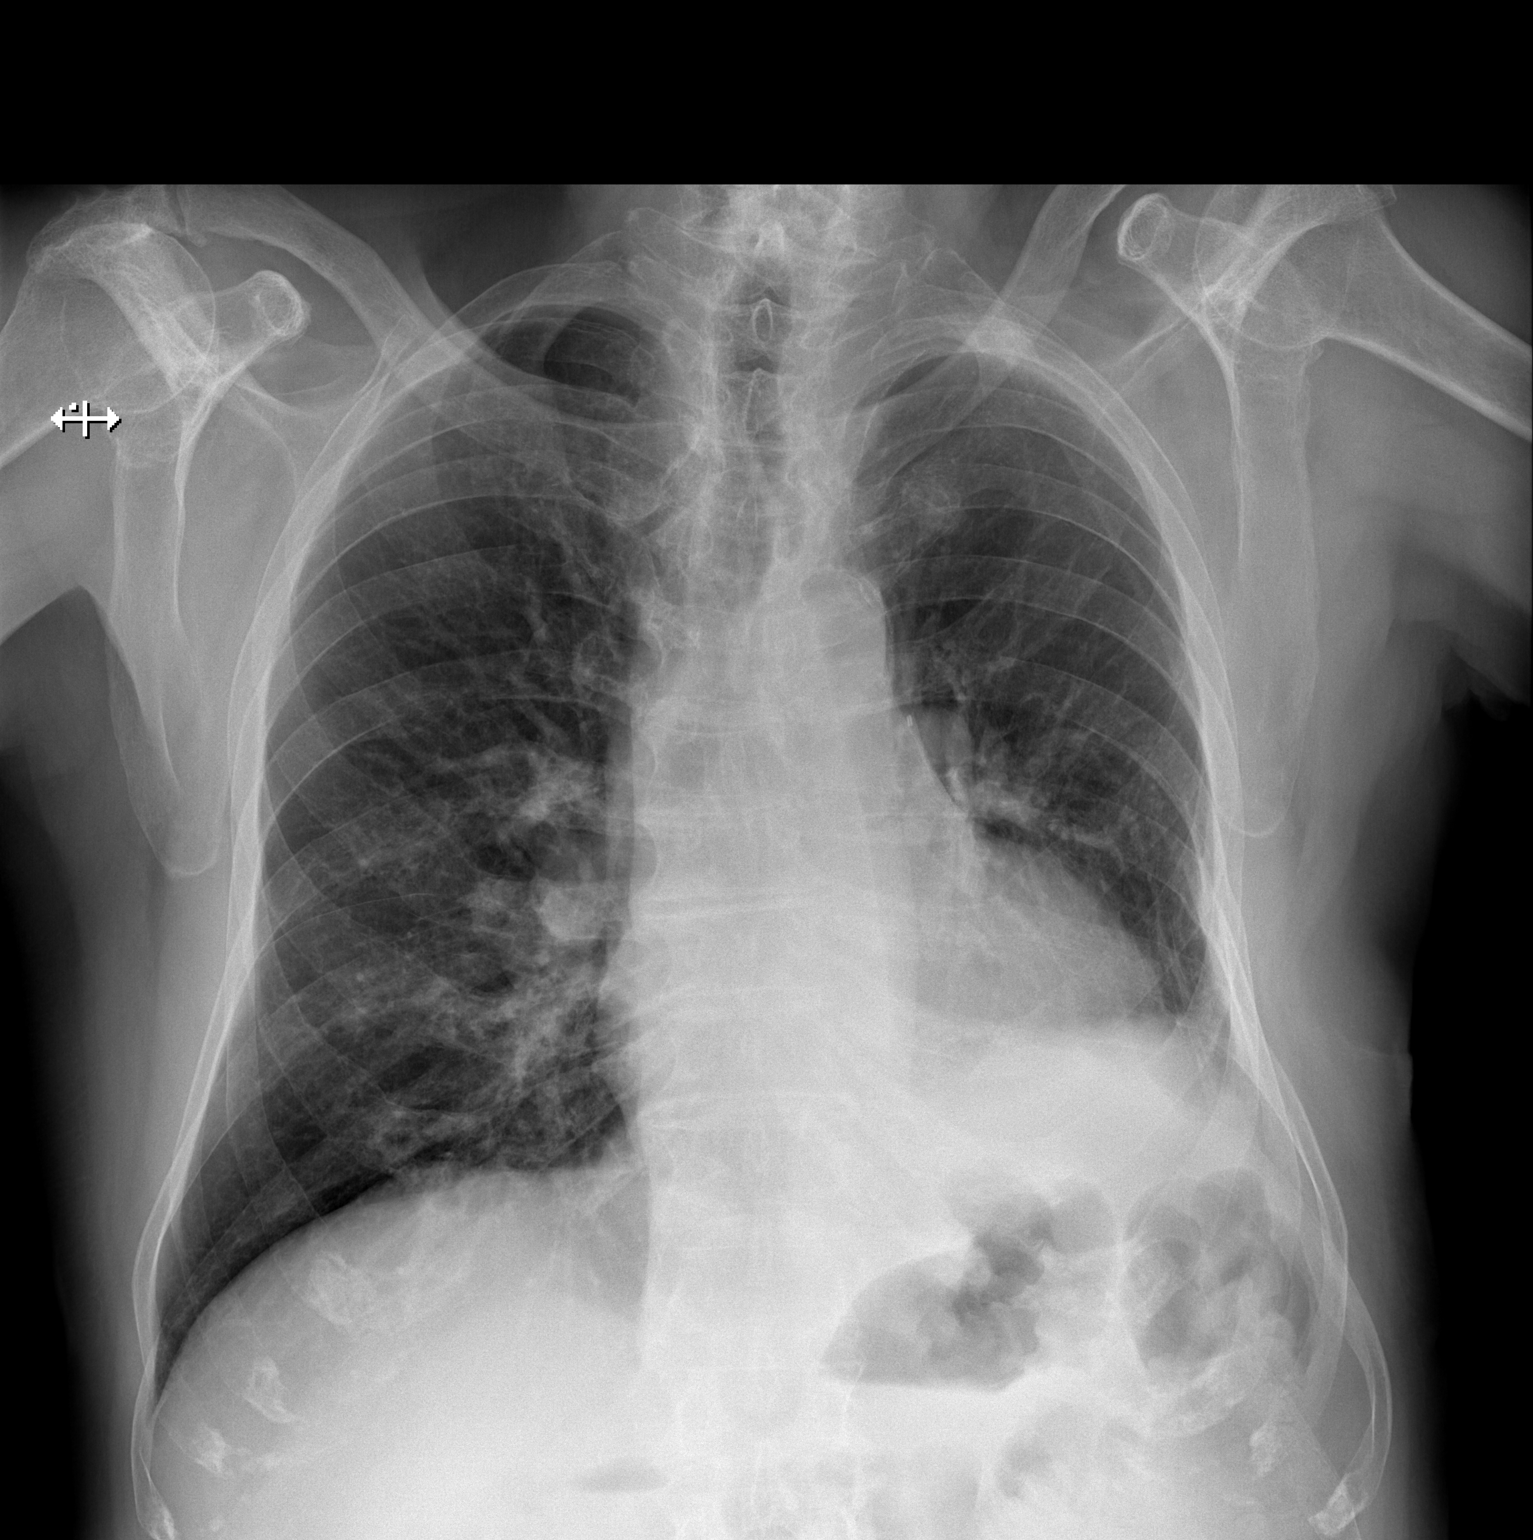

[w chest lat]
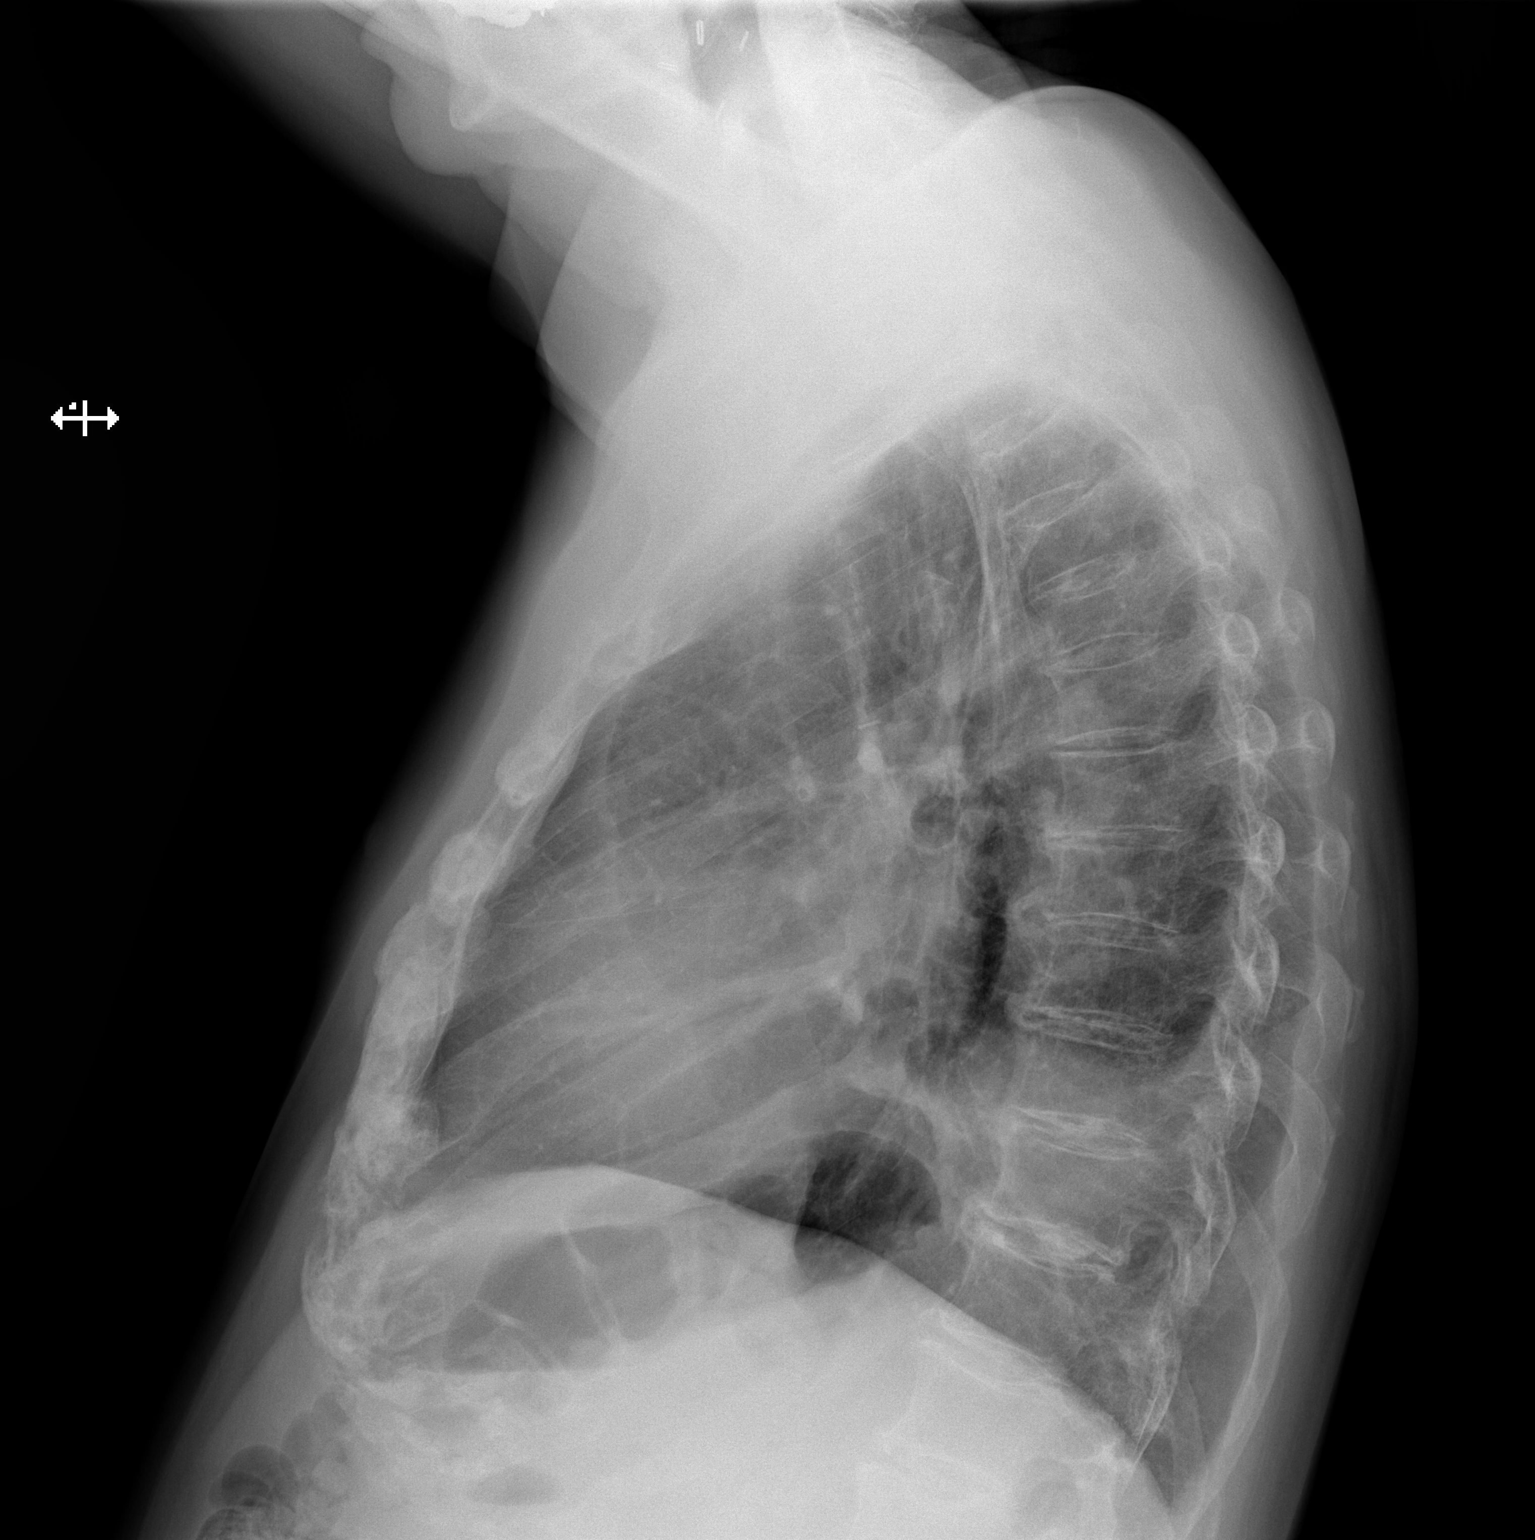

[2 of 2 positions shown; findings below may reference images not displayed]

FINDINGS: Volume loss of the left hemi thorax is again noted with some
elevation of the left hemidiaphragm remaining. The right lung is
clear. Mediastinal hilar contours are unchanged and the heart is
borderline enlarged and stable. No acute bony abnormality is seen.
IMPRESSION: Stable volume loss in the left hemi thorax with elevation of left
hemidiaphragm. No definite active process.

## 2017-11-03 ENCOUNTER — Other Ambulatory Visit (HOSPITAL_COMMUNITY): Payer: Self-pay | Admitting: Cardiology

## 2017-11-03 IMAGING — CT CT CHEST W/O CM
2 of 4 series · 15 of 30 positions shown, 17 images · non-contrast
Comparison: Chest CT 12/15/2014.

CLINICAL DATA: 80-year-old male under preoperative evaluation prior
to CABG. Former smoker who quit 50 years ago. History of left upper
lobe lung cancer status post left upper lobectomy. Followup study.

EXAM:
CT CHEST WITHOUT CONTRAST
TECHNIQUE: Multidetector CT imaging of the chest was performed following the
standard protocol without IV contrast.

[Series 3: chest w/o · axial · non-contrast · 0.65mm/px · z∈[-283,-16]mm · 8 of 139 slices shown, 10 images]
[im 16/139  mediastinal]
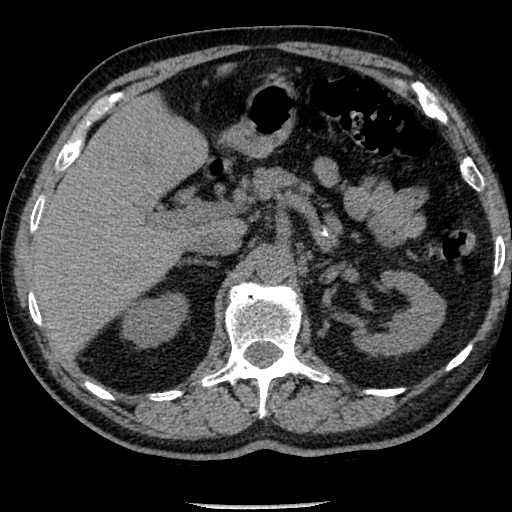
[im 16/139  lung]
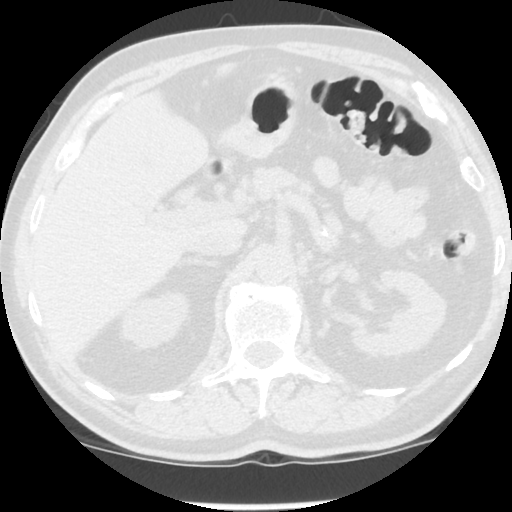
[im 31/139  lung]
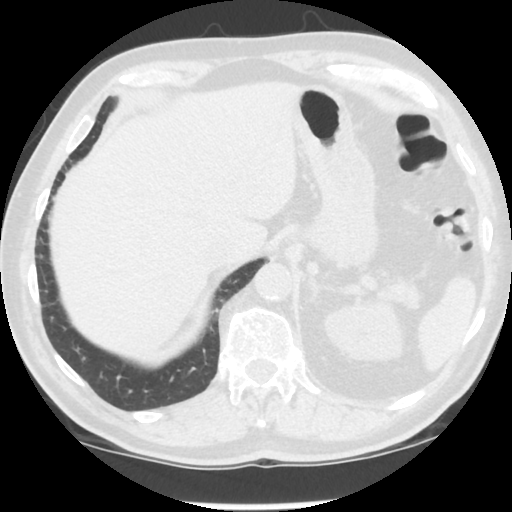
[im 47/139  lung]
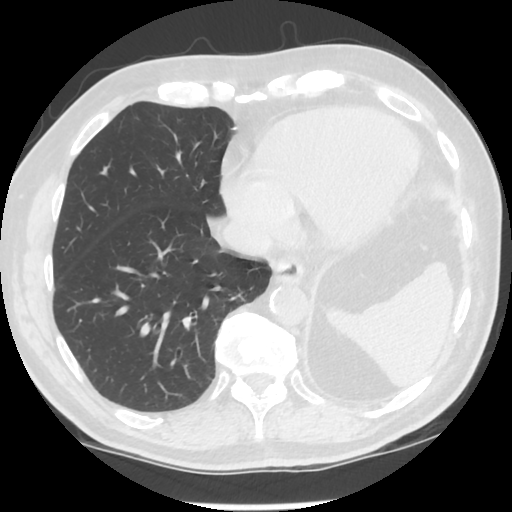
[im 62/139  lung]
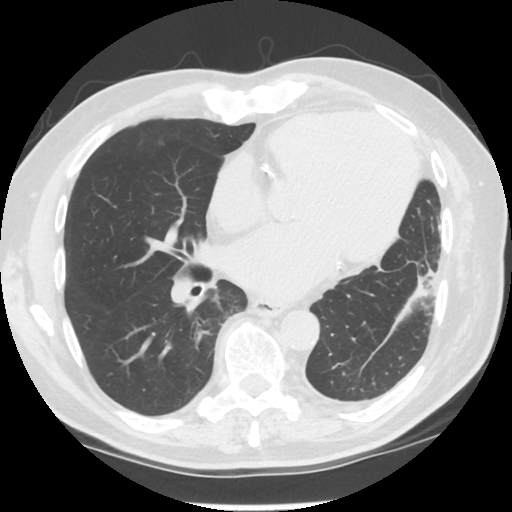
[im 77/139  mediastinal]
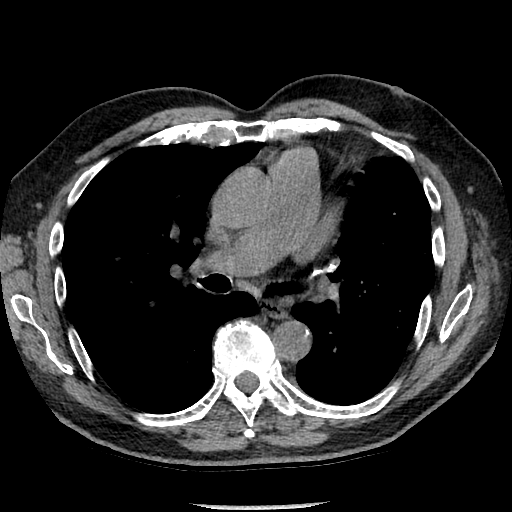
[im 77/139  lung]
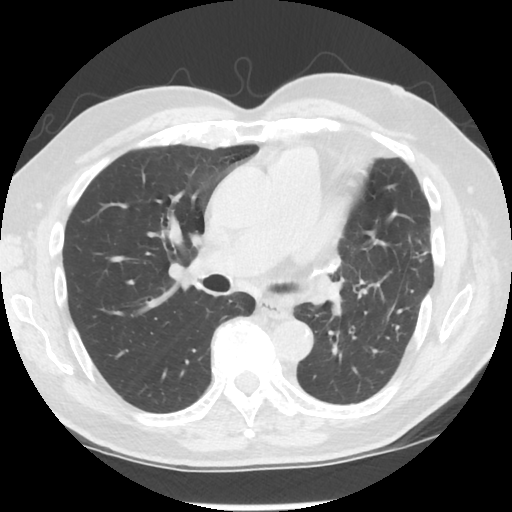
[im 93/139  lung]
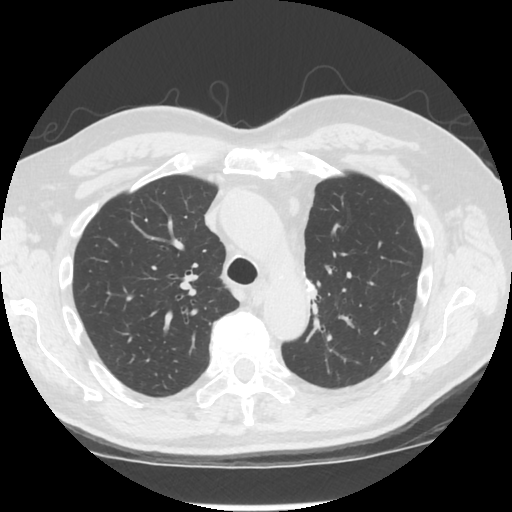
[im 108/139  lung]
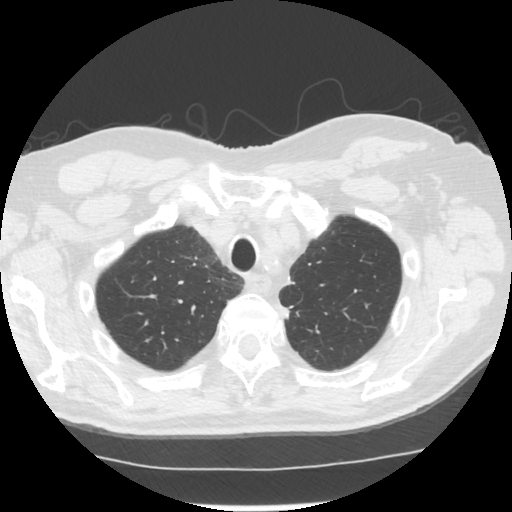
[im 123/139  lung]
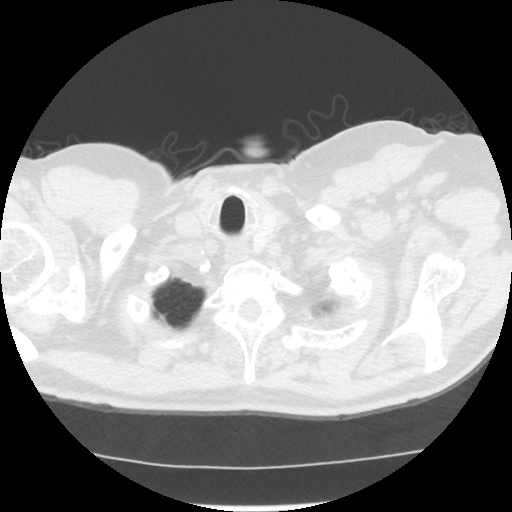

[Series 4: lung windows · axial · 0.65mm/px · z∈[-278,-20]mm · 7 of 139 slices shown]
[im 18/139  lung]
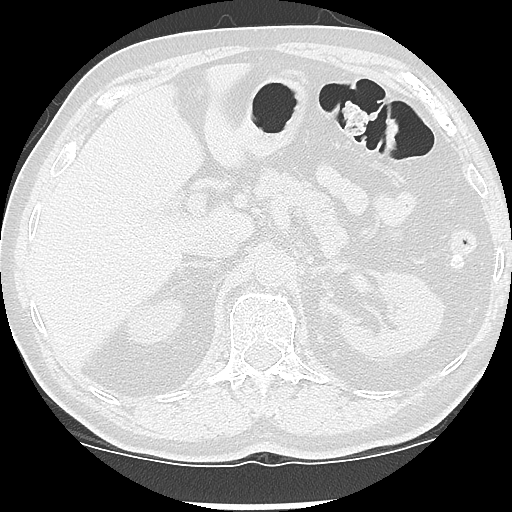
[im 35/139  lung]
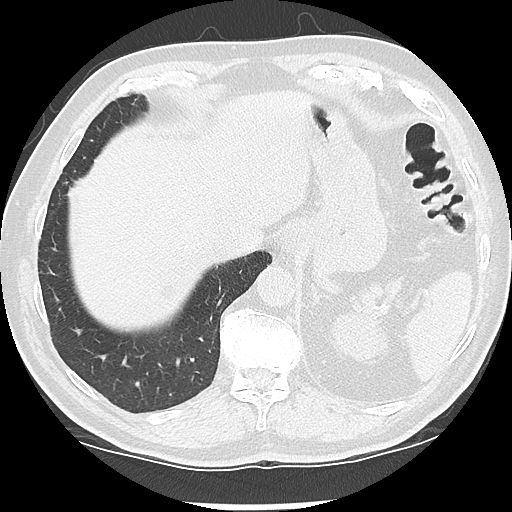
[im 52/139  lung]
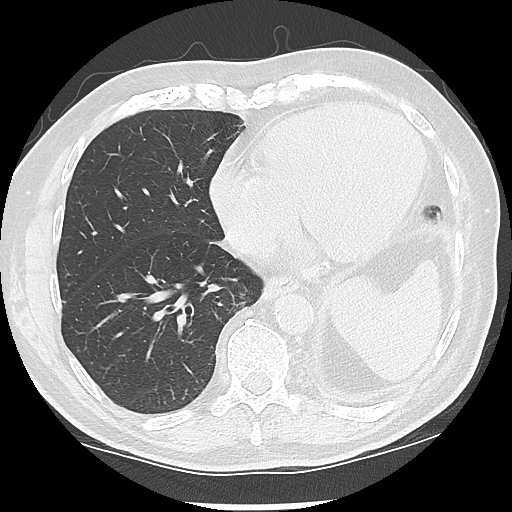
[im 70/139  lung]
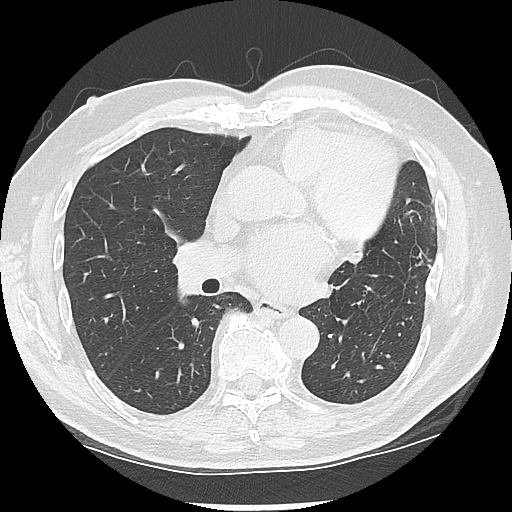
[im 87/139  lung]
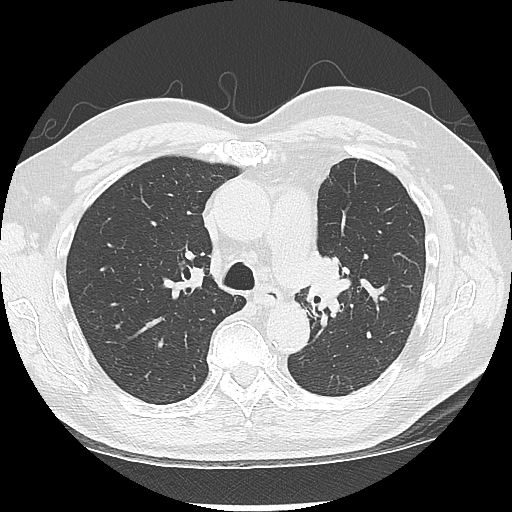
[im 104/139  lung]
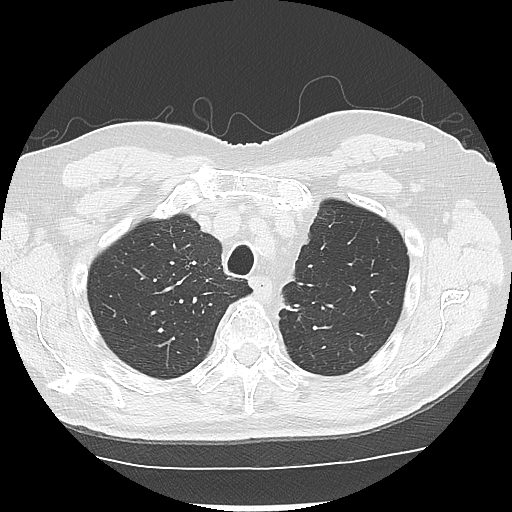
[im 121/139  lung]
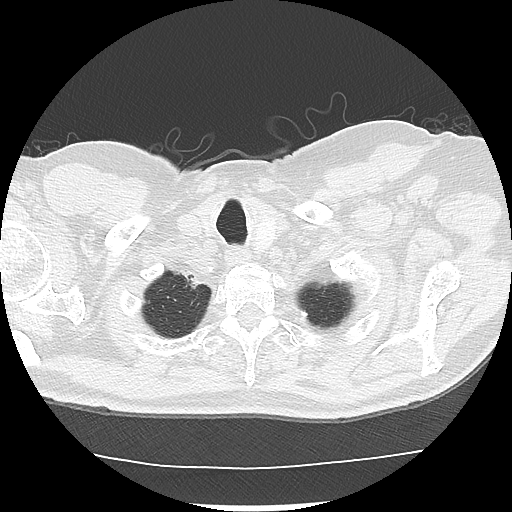

[15 of 30 positions shown; findings below may reference images not displayed]

FINDINGS: Cardiovascular: Heart size is mildly enlarged. There is no
significant pericardial fluid, thickening or pericardial
calcification. There is aortic atherosclerosis, as well as
atherosclerosis of the great vessels of the mediastinum and the
coronary arteries, including calcified atherosclerotic plaque in the
left main, left anterior descending, left circumflex and right
coronary arteries.

Mediastinum/Nodes: No pathologically enlarged mediastinal or hilar
lymph nodes. Please note that accurate exclusion of hilar adenopathy
is limited on noncontrast CT scans. Esophagus is unremarkable in
appearance.

Lungs/Pleura: Status post left upper lobectomy. Compensatory
hyperexpansion of the left lower lobe. Small amount of pleural
thickening in the base of the left hemithorax (unchanged). No
significant pleural fluid. No suspicious appearing pulmonary nodules
or masses. No acute consolidative airspace disease.

Upper Abdomen: 1 cm low-attenuation lesion in segment 7 of the liver
(image 105 of series 3) incompletely characterized on today's
noncontrast CT examination, but similar to the prior examination,
likely a small cyst. Aortic atherosclerosis. Colonic diverticulosis
in the region of the splenic flexure without surrounding
inflammatory changes to suggest an acute diverticulitis at this
time. Nonobstructive calculi in the lower pole collecting system of
left kidney measuring up to 6 mm.

Musculoskeletal: There are no aggressive appearing lytic or blastic
lesions noted in the visualized portions of the skeleton.
IMPRESSION: 1. Status post left upper lobectomy. No findings to suggest
recurrent or metastatic disease in the thorax.
2. Aortic atherosclerosis, in addition to left main and 3 vessel
coronary artery disease. Assessment for potential risk factor
modification, dietary therapy or pharmacologic therapy may be
warranted, if clinically indicated.
3. Mild cardiomegaly.
4. Nonobstructive calculi in the collecting system of the left
kidney measuring up to 6 mm in the lower pole.
5. Colonic diverticulosis.
6. Additional incidental findings, as above.

## 2017-11-11 DIAGNOSIS — Z23 Encounter for immunization: Secondary | ICD-10-CM | POA: Diagnosis not present

## 2017-11-17 DIAGNOSIS — I10 Essential (primary) hypertension: Secondary | ICD-10-CM | POA: Diagnosis not present

## 2017-11-17 DIAGNOSIS — I255 Ischemic cardiomyopathy: Secondary | ICD-10-CM | POA: Diagnosis not present

## 2017-11-17 DIAGNOSIS — I25810 Atherosclerosis of coronary artery bypass graft(s) without angina pectoris: Secondary | ICD-10-CM | POA: Diagnosis not present

## 2017-11-17 DIAGNOSIS — E7800 Pure hypercholesterolemia, unspecified: Secondary | ICD-10-CM | POA: Diagnosis not present

## 2017-12-11 ENCOUNTER — Other Ambulatory Visit: Payer: Self-pay | Admitting: *Deleted

## 2017-12-11 DIAGNOSIS — Z85118 Personal history of other malignant neoplasm of bronchus and lung: Secondary | ICD-10-CM

## 2017-12-11 NOTE — Progress Notes (Unsigned)
CT

## 2018-01-19 DIAGNOSIS — Z85828 Personal history of other malignant neoplasm of skin: Secondary | ICD-10-CM | POA: Diagnosis not present

## 2018-01-19 DIAGNOSIS — L820 Inflamed seborrheic keratosis: Secondary | ICD-10-CM | POA: Diagnosis not present

## 2018-01-19 DIAGNOSIS — L570 Actinic keratosis: Secondary | ICD-10-CM | POA: Diagnosis not present

## 2018-01-19 DIAGNOSIS — L821 Other seborrheic keratosis: Secondary | ICD-10-CM | POA: Diagnosis not present

## 2018-01-19 DIAGNOSIS — D485 Neoplasm of uncertain behavior of skin: Secondary | ICD-10-CM | POA: Diagnosis not present

## 2018-01-19 DIAGNOSIS — D0439 Carcinoma in situ of skin of other parts of face: Secondary | ICD-10-CM | POA: Diagnosis not present

## 2018-01-28 ENCOUNTER — Inpatient Hospital Stay: Admission: RE | Admit: 2018-01-28 | Payer: PPO | Source: Ambulatory Visit

## 2018-01-28 ENCOUNTER — Ambulatory Visit
Admission: RE | Admit: 2018-01-28 | Discharge: 2018-01-28 | Disposition: A | Discharge status: Home / Self Care | Payer: PPO | Source: Ambulatory Visit | Attending: Cardiothoracic Surgery | Admitting: Cardiothoracic Surgery

## 2018-01-28 ENCOUNTER — Other Ambulatory Visit: Payer: Self-pay | Admitting: *Deleted

## 2018-01-28 DIAGNOSIS — Z85118 Personal history of other malignant neoplasm of bronchus and lung: Secondary | ICD-10-CM

## 2018-01-28 DIAGNOSIS — R918 Other nonspecific abnormal finding of lung field: Secondary | ICD-10-CM | POA: Diagnosis not present

## 2018-01-29 ENCOUNTER — Other Ambulatory Visit: Payer: Self-pay

## 2018-01-29 ENCOUNTER — Encounter: Payer: Self-pay | Admitting: Cardiothoracic Surgery

## 2018-01-29 ENCOUNTER — Ambulatory Visit: Payer: PPO | Admitting: Cardiothoracic Surgery

## 2018-01-29 VITALS — BP 104/68 | HR 83 | Resp 18 | Ht 64.0 in | Wt 175.2 lb

## 2018-01-29 DIAGNOSIS — Z902 Acquired absence of lung [part of]: Secondary | ICD-10-CM

## 2018-01-29 DIAGNOSIS — Z85118 Personal history of other malignant neoplasm of bronchus and lung: Secondary | ICD-10-CM | POA: Diagnosis not present

## 2018-01-29 DIAGNOSIS — I25110 Atherosclerotic heart disease of native coronary artery with unstable angina pectoris: Secondary | ICD-10-CM | POA: Diagnosis not present

## 2018-01-29 NOTE — Progress Notes (Signed)
[address]       [place] [address]             [phone]      Ramesses H [facility] Medical Record #[id number] Date of Birth: [birth year]  Referring: Corey Skains, MD Primary Care: Lavone Orn, MD  Chief Complaint:   POST OP FOLLOW UP 07/19/2016  OPERATIVE REPORT PREOPERATIVE DIAGNOSIS:  Three-vessel coronary artery disease with ischemic cardiomyopathy, ejection fraction 20%. POSTOPERATIVE DIAGNOSIS:  Three-vessel coronary artery disease with ischemic cardiomyopathy, ejection fraction 20%. Nonunion of the right sternoclavicular joint. PROCEDURE:  Coronary artery bypass grafting x4 with left internal mammary to the left anterior descending coronary artery, reverse saphenous vein graft to the diagonal coronary artery, reverse saphenous vein graft to the circumflex coronary artery, reverse saphenous vein graft to the posterior descending coronary artery with right thigh and calf greater saphenous endoscopic vein harvesting. Plating of right sternoclavicular joint SURGEON:  Lanelle Bal, MD.  06/19/2012  OPERATIVE REPORT  PREOPERATIVE DIAGNOSIS: Left upper lobe lung mass.  POSTOPERATIVE DIAGNOSES: Left upper lobe lung mass, squamous cell  carcinoma, non-small cell carcinoma by frozen section.  PROCEDURE PERFORMED: Video bronchoscopy, left video-assisted  thoracoscopy with mini thoracotomy, resection of left upper lobe and  portion of superior segment left lower lobe and lymph node dissection.   Stage IB, (pT2a,pN0,cM0 SQUAMOUS CELL CARCINOMA, 3.8 CM.)   History of Present Illness:    Patient comes to the office today in follow-up low-dose CT scan of the chest for follow-up on his previous history of left upper lobectomy for stage Ib carcinoma of the lung.,  Resected 2013.  In 2017 he underwent coronary artery bypass grafting for three-vessel coronary artery disease and severe LV dysfunction and dilated cardiomyopathy. Since last  seen the patient continues to do well.  He notes some mild shortness of breath with exertion but none is limiting to his usual activities. He denies pedal edema or nocturnal dyspnea  He has had no hemoptysis or respiratory infections    Patient comes in today with a follow-up CT scan of the chest for his history of stage IB carcinoma of the lung.   Past Medical History:  Diagnosis Date  . Arthritis    JOINT PAIN RIGHT HAND  . BPH (benign prostatic hyperplasia)    [name]/elevated PSA, prostate biopsy x4 including one saturation biopsy, laser treatment 2/14; NOCTURIA  . CAD (coronary artery disease) 2013   cath 05/2012 showing 80-90% stenosis of a trifurcating diagonal #1, 70-80% stenosis of OM3 and 90% stenosis of distal LCx after OM3 - medical management, [place]  . Cardiomyopathy ([place])    dilated cardiomyopathy EF 30%, MUGA  EF 42% 08/2012  . Carotid artery occlusion    carotid artery bruit  . Chronic kidney disease    kidney stones -small passed.  . Diastolic dysfunction   . Heart murmur    as a child  . History of shingles   . Hyperlipidemia    statin intolerant  . Hypertension   . Lung mass    Stage 1B non-small cell lung CA s/p resection 2013  . PVC (premature ventricular contraction)   . Shingles   . Sigmoid diverticulosis   . Skin cancer    Multiple skin cancers   . Squamous cell carcinoma, face    history of right face with mets to right upper cheek in 2004  and facial lymph node reoccurence post surgery with XRT  . Trigger finger  Bilateral     Social History   Tobacco Use  Smoking Status Former Smoker  . Types: Cigarettes  . Last attempt to quit: 12/30/1965  . Years since quitting: 52.1  Smokeless Tobacco Never Used    Social History   Substance and Sexual Activity  Alcohol Use Yes  . Alcohol/week: 3.0 - 3.6 oz  . Types: 3 - 4 Glasses of wine, 2 Cans of beer per week   Comment: infrequently- "indulge heavly when I do"     Allergies  Allergen  Reactions  . Ramipril Cough  . Statins     Confusion Muscle pain     Current Outpatient Medications  Medication Sig Dispense Refill  . Ascorbic Acid (VITAMIN C) 1000 MG tablet Take 1,000 mg by mouth daily.    Marland Kitchen aspirin 81 MG tablet Take 81 mg by mouth daily.    Marland Kitchen b complex vitamins tablet Take 1 tablet by mouth daily.    . carvedilol (COREG) 6.25 MG tablet Take 1 tablet (6.25 mg total) by mouth 2 (two) times daily. 180 tablet 3  . ezetimibe (ZETIA) 10 MG tablet Take 10 mg by mouth daily.    . Flaxseed, Linseed, (FLAXSEED OIL PO) Take by mouth.    Marland Kitchen GARLIC PO Take by mouth.    . losartan (COZAAR) 25 MG tablet Take 1 tablet (25 mg total) at bedtime by mouth. Needs office visit 90 tablet 3  . Multiple Vitamin (MULTIVITAMIN WITH MINERALS) TABS tablet Take 1 tablet by mouth daily.    . Omega 3 1200 MG CAPS Take 1 capsule by mouth 2 (two) times daily.     Marland Kitchen pyridoxine (B-6) 100 MG tablet Take 100 mg by mouth daily.    . Red Yeast Rice Extract 600 MG CAPS Take 1 capsule by mouth 2 (two) times daily.    Marland Kitchen spironolactone (ALDACTONE) 25 MG tablet TAKE 1 TABLET BY MOUTH DAILY 90 tablet 3  . losartan (COZAAR) 25 MG tablet TAKE 1 TABLET (25 MG TOTAL) BY MOUTH AT BEDTIME. 90 tablet 3   No current facility-administered medications for this visit.     Family History  Problem Relation Age of Onset  . Diabetes Father   . Psoriasis Child   . Heart disease Child        resuscitated from cardiac arrest from Brugada's syndrome  . Lung cancer Mother   . Heart disease Mother    Review of Systems  Constitutional: Negative.   HENT: Negative.   Eyes: Negative.   Respiratory: Positive for shortness of breath. Negative for cough, hemoptysis, sputum production and wheezing.   Cardiovascular: Negative for chest pain, palpitations, orthopnea, claudication, leg swelling and PND.  Gastrointestinal: Negative.   Genitourinary: Positive for frequency.  Musculoskeletal: Negative.   Skin:       Patient has  frequent recurrence of skin cancer and is followed by dermatology  Neurological: Negative.   Endo/Heme/Allergies: Negative.      Physical Exam: BP 104/68 (BP Location: Right Arm, Patient Position: Sitting, Cuff Size: Large)   Pulse 83   Resp 18   Ht 5\' 4"  (1.626 m)   Wt 175 lb 3.2 oz (79.5 kg)   SpO2 96% Comment: RA  BMI 30.07 kg/m  General appearance: alert, cooperative and appears stated age Head: Normocephalic, without obvious abnormality, atraumatic Neck: no adenopathy, no carotid bruit, no JVD, supple, symmetrical, trachea midline and thyroid not enlarged, symmetric, no tenderness/mass/nodules Lymph nodes: Cervical, supraclavicular, and axillary nodes normal. Resp: clear to auscultation  bilaterally Back: symmetric, no curvature. ROM normal. No CVA tenderness. Cardio: regular rate and rhythm, S1, S2 normal, no murmur, click, rub or gallop GI: soft, non-tender; bowel sounds normal; no masses,  no organomegaly Extremities: extremities normal, atraumatic, no cyanosis or edema and Homans sign is negative, no sign of DVT Neurologic: Grossly normal  Diagnostic Studies & Laboratory data:     Recent Radiology Findings:  Ct Chest Wo Contrast  Result Date: 01/28/2018 CLINICAL DATA:  82 year old male with history of left upper lobectomy for lung cancer in 2015. Former smoker (quit 55 years ago). EXAM: CT CHEST WITHOUT CONTRAST TECHNIQUE: Multidetector CT imaging of the chest was performed following the standard protocol without IV contrast. COMPARISON:  Chest CT 01/02/2017. FINDINGS: Cardiovascular: Heart size is borderline enlarged. There is no significant pericardial fluid, thickening or pericardial calcification. There is aortic atherosclerosis, as well as atherosclerosis of the great vessels of the mediastinum and the coronary arteries, including calcified atherosclerotic plaque in the left main, left anterior descending, left circumflex and right coronary arteries. Status post median  sternotomy for CABG including LIMA to the LAD. Dilatation of the pulmonic trunk (3.6 cm in diameter). Mediastinum/Nodes: No pathologically enlarged mediastinal or hilar lymph nodes. Please note that accurate exclusion of hilar adenopathy is limited on noncontrast CT scans. Esophagus is unremarkable in appearance. No axillary lymphadenopathy. Lungs/Pleura: Status post left upper lobectomy. Compensatory hyperexpansion of the left lower lobe. Chronic elevation of the left hemidiaphragm. Areas of mild postoperative scarring are again noted throughout the left lower lobe. No suspicious appearing pulmonary nodules or masses are noted. No acute consolidative airspace disease. No pleural effusions. Upper Abdomen: 11 mm low-attenuation lesion in segment 7 of the liver, incompletely characterized on today's noncontrast CT examination, but stable to the prior study, likely a cyst. Aortic atherosclerosis. Colonic diverticulosis in the proximal descending colon. Musculoskeletal: There are no aggressive appearing lytic or blastic lesions noted in the visualized portions of the skeleton. Median sternotomy wires. IMPRESSION: 1. Status post left upper lobectomy. No findings to suggest local recurrence of disease or metastatic disease in the thorax. 2. Aortic atherosclerosis, in addition to left main and 3 vessel coronary artery disease. Status post median sternotomy for CABG including LIMA to the LAD. 3. Additional incidental findings, as above. Aortic Atherosclerosis (ICD10-I70.0). Electronically Signed   By: Vinnie Langton M.D.   On: 01/28/2018 12:06  I have independently reviewed the above radiology studies  and reviewed the findings with the patient.     Ct Chest Wo Contrast  Result Date: 01/02/2017 CLINICAL DATA:  Stage IB squamous cell lung carcinoma of the left upper lobe status post left upper lobectomy 06/16/2012, presenting for restaging. No current symptoms are reported. Remote smoking history. EXAM: CT CHEST  WITHOUT CONTRAST TECHNIQUE: Multidetector CT imaging of the chest was performed following the standard protocol without IV contrast. COMPARISON:  07/18/2016 chest CT.  08/26/2016 chest radiograph. FINDINGS: Cardiovascular: Stable mild cardiomegaly. No significant pericardial fluid/thickening. Left main, left anterior descending, left circumflex and right coronary atherosclerosis status post CABG with ascending aortic and left internal mammary bypass grafts. Atherosclerotic nonaneurysmal thoracic aorta. Stable enlarged main pulmonary artery (3.5 cm diameter). Mediastinum/Nodes: No discrete thyroid nodules. Unremarkable esophagus. No pathologically enlarged axillary, mediastinal or gross hilar lymph nodes, noting limited sensitivity for the detection of hilar adenopathy on this noncontrast study. Lungs/Pleura: No pneumothorax. No pleural effusion. Stable volume loss in the left hemithorax status post left upper lobectomy. Peripheral basilar right lower lobe 2 mm solid pulmonary nodule (series  4/ image 102) is stable back to 12/15/2014 and considered benign. Stable mild postsurgical scarring in the basilar left lower lobe. No acute consolidative airspace disease, lung masses or new significant pulmonary nodules. Upper abdomen: Simple 1.1 cm posterior upper right liver cyst. Clustered nonobstructing stones in the lower left kidney measuring up to 4 mm. Left colonic diverticulosis. Musculoskeletal: No aggressive appearing focal osseous lesions. Surgical plate with interlocking screws in the anterior right sternoclavicular joint. Sternotomy wires appear aligned and intact. Moderate thoracic spondylosis. Mild gynecomastia, asymmetric to the left, new. IMPRESSION: 1. No evidence of local tumor recurrence status post left upper lobectomy. 2. No evidence of metastatic disease in the chest. 3. New mild gynecomastia, asymmetric to the left. 4. Stable mild cardiomegaly. Stable dilated main pulmonary artery suggesting chronic  pulmonary arterial hypertension. 5. Aortic atherosclerosis. Left main and 3 vessel coronary atherosclerosis status post CABG . 6. Nonobstructing lower left nephrolithiasis. Electronically Signed   By: Ilona Sorrel M.D.   On: 01/02/2017 11:49      Recent Lab Findings: Lab Results  Component Value Date   WBC 8.1 07/27/2016   HGB 8.6 (L) 07/27/2016   HCT 26.7 (L) 07/27/2016   PLT 305 07/27/2016   GLUCOSE 164 (H) 03/17/2017   CHOL 157 03/17/2017   TRIG 106 03/17/2017   HDL 61 03/17/2017   LDLDIRECT 147.6 11/08/2013   LDLCALC 75 03/17/2017   ALT 39 07/26/2016   AST 31 07/26/2016   NA 137 03/17/2017   K 4.0 03/17/2017   CL 103 03/17/2017   CREATININE 1.11 03/17/2017   BUN 27 (H) 03/17/2017   CO2 26 03/17/2017   TSH 1.645 07/22/2016   INR 1.50 (H) 07/19/2016   HGBA1C 5.8 (H) 07/17/2016      Assessment / Plan:    Patient stable after his coronary artery bypass grafting with severe LV dysfunction now symptomatically much improved, he's had no overt symptoms of congestive heart failure Follow-up CT of the chest today shows no evidence of recurrent malignancy in the chest, his pulmonary artery remains mildly dilated Plan to see him back in one year with a low-dose CT scan of the chest  He will make a follow-up appointment with cardiology as he has not been back for close to a year. We will plan to see him back with a follow-up low-dose CT of the chest, for follow-up after his lung resection  Grace Isaac MD      [address] [place] [address] Office [phone]   Beeper [phone]  01/29/2018 10:14 AM

## 2018-02-13 DIAGNOSIS — I10 Essential (primary) hypertension: Secondary | ICD-10-CM | POA: Diagnosis not present

## 2018-02-13 DIAGNOSIS — R42 Dizziness and giddiness: Secondary | ICD-10-CM | POA: Diagnosis not present

## 2018-02-13 DIAGNOSIS — I2510 Atherosclerotic heart disease of native coronary artery without angina pectoris: Secondary | ICD-10-CM | POA: Diagnosis not present

## 2018-02-13 DIAGNOSIS — I5022 Chronic systolic (congestive) heart failure: Secondary | ICD-10-CM | POA: Diagnosis not present

## 2018-02-17 DIAGNOSIS — Z85118 Personal history of other malignant neoplasm of bronchus and lung: Secondary | ICD-10-CM | POA: Diagnosis not present

## 2018-02-17 DIAGNOSIS — I255 Ischemic cardiomyopathy: Secondary | ICD-10-CM | POA: Diagnosis not present

## 2018-02-17 DIAGNOSIS — I10 Essential (primary) hypertension: Secondary | ICD-10-CM | POA: Diagnosis not present

## 2018-02-17 DIAGNOSIS — N400 Enlarged prostate without lower urinary tract symptoms: Secondary | ICD-10-CM | POA: Diagnosis not present

## 2018-02-17 DIAGNOSIS — I480 Paroxysmal atrial fibrillation: Secondary | ICD-10-CM | POA: Diagnosis not present

## 2018-02-17 DIAGNOSIS — I25810 Atherosclerosis of coronary artery bypass graft(s) without angina pectoris: Secondary | ICD-10-CM | POA: Diagnosis not present

## 2018-03-19 DIAGNOSIS — M25512 Pain in left shoulder: Secondary | ICD-10-CM | POA: Diagnosis not present

## 2018-03-19 DIAGNOSIS — G8322 Monoplegia of upper limb affecting left dominant side: Secondary | ICD-10-CM | POA: Diagnosis not present

## 2018-03-19 DIAGNOSIS — G8323 Monoplegia of upper limb affecting right nondominant side: Secondary | ICD-10-CM | POA: Diagnosis not present

## 2018-03-25 ENCOUNTER — Other Ambulatory Visit: Payer: Self-pay

## 2018-03-25 ENCOUNTER — Encounter: Payer: Self-pay | Admitting: Neurology

## 2018-03-25 ENCOUNTER — Ambulatory Visit (INDEPENDENT_AMBULATORY_CARE_PROVIDER_SITE_OTHER): Payer: PPO | Admitting: Neurology

## 2018-03-25 VITALS — BP 124/73 | HR 84 | Resp 18 | Ht 64.0 in | Wt 175.0 lb

## 2018-03-25 DIAGNOSIS — R29898 Other symptoms and signs involving the musculoskeletal system: Secondary | ICD-10-CM | POA: Diagnosis not present

## 2018-03-25 DIAGNOSIS — M25512 Pain in left shoulder: Secondary | ICD-10-CM | POA: Diagnosis not present

## 2018-03-25 DIAGNOSIS — M4802 Spinal stenosis, cervical region: Secondary | ICD-10-CM | POA: Diagnosis not present

## 2018-03-25 DIAGNOSIS — G8929 Other chronic pain: Secondary | ICD-10-CM

## 2018-03-25 DIAGNOSIS — R200 Anesthesia of skin: Secondary | ICD-10-CM | POA: Diagnosis not present

## 2018-03-25 DIAGNOSIS — M5412 Radiculopathy, cervical region: Secondary | ICD-10-CM | POA: Insufficient documentation

## 2018-03-25 NOTE — Progress Notes (Signed)
[facility]  PATIENT: [name] [name] DOB: [birth year]  REFERRING DOCTOR OR PCP:  Dr. Delilah Shan ([facility]); Lavone Orn, MD is PCP SOURCE: Patient, notes in EMR, MRI report and images on PACS personally reviewed  _________________________________   HISTORICAL  CHIEF COMPLAINT:  Chief Complaint  Patient presents with  . Left Shoulder Pain    Left shoulder pain, weakness, onset 8 yrs. ago. No known injury.  Sts. has seen ortho and was told he has some arthritis in his shoulder, MRI of shoulder sched. for this Saturday at [facility].  Also c/o numbness all fingertips, and pain radiating up both arms, onset 2 yrs. ago, with no known injury/fim  . Numbness    HISTORY OF PRESENT ILLNESS:  I had the pleasure seeing the patient, [name] [name], at [facility] for neurologic consultation regarding his left shoulder pain and numbness.  He reports that he has had left shoulder pain and weakness that started about 8 years ago without any known injury.   Pain was in the proximal shoulder.  Additionally, for the past 2 years or so, there is numbness in the fingertips, all ten, and pain that radiates in both arms.    The onset was after CABG 2017.   He has decreased ability to use his fingers and cannot button shirts.     More recently, he has seen orthopedics for the shoulder pain in the left shoulder and also limited movement, worsening over past couple years.    He had a shoulder x-ray and was told that there was mild shoulder arthritis.  A shoulder MRI is scheduled soon.  He does not note any pain in the neck and the radiating pain comes up from the hands not down from the neck.    Besides numbness, he has altered sensation in the hands.     He is unable to raise the left shoulder but passive ROM is fine.    He cannot lift a gallon of milk up into the refrigerator.      He has had some incontinence since the "Greenlight procedure" around 2013 for  his prostate.    His gait is good.     He was diagnosed with lung cancer in 2013.   He had a lobectomy (Left upper) and did not need any chemotherapy or radiation.       He had an MRI of the cervical spine performed 12/02/2006 showing prominent multilevel foraminal stenosis and moderate spinal stenosis at C5-C6 and C6-C7.   There is moderate foraminal narrowing bilaterally at C5-C6 and to the left at C6-C7.   There is varying degrees of foraminal narrowing, mostly moderate, at C3-C4 and C4-C5.     REVIEW OF SYSTEMS: Constitutional: No fevers, chills, sweats, or change in appetite Eyes: No visual changes, double vision, eye pain Ear, nose and throat: No hearing loss, ear pain, nasal congestion, sore throat Cardiovascular: No chest pain, palpitations Respiratory: No shortness of breath at rest or with exertion.   No wheezes.   He has had a left upper lobectomy for cancer. GastrointestinaI: No nausea, vomiting, diarrhea, abdominal pain, fecal incontinence Genitourinary:He has a dribbling incontinence since a prostate procedure Musculoskeletal:as above Integumentary: No rash, pruritus, skin lesions Neurological: as above Psychiatric: No depression at this time.  No anxiety Endocrine: No palpitations, diaphoresis, change in appetite, change in weigh or increased thirst Hematologic/Lymphatic: No anemia, purpura, petechiae. Allergic/Immunologic: No itchy/runny eyes, nasal congestion, recent allergic reactions, rashes  ALLERGIES: Allergies  Allergen Reactions  .  Ramipril Cough  . Statins     Confusion Muscle pain     HOME MEDICATIONS:  Current Outpatient Medications:  .  Ascorbic Acid (VITAMIN C) 1000 MG tablet, Take 1,000 mg by mouth daily., Disp: , Rfl:  .  aspirin 81 MG tablet, Take 81 mg by mouth daily., Disp: , Rfl:  .  b complex vitamins tablet, Take 1 tablet by mouth daily., Disp: , Rfl:  .  carvedilol (COREG) 6.25 MG tablet, Take 1 tablet (6.25 mg total) by mouth 2 (two)  times daily., Disp: 180 tablet, Rfl: 3 .  ezetimibe (ZETIA) 10 MG tablet, Take 10 mg by mouth daily., Disp: , Rfl:  .  Flaxseed, Linseed, (FLAXSEED OIL PO), Take by mouth., Disp: , Rfl:  .  GARLIC PO, Take by mouth., Disp: , Rfl:  .  Multiple Vitamin (MULTIVITAMIN WITH MINERALS) TABS tablet, Take 1 tablet by mouth daily., Disp: , Rfl:  .  Red Yeast Rice Extract 600 MG CAPS, Take 1 capsule by mouth 2 (two) times daily., Disp: , Rfl:  .  spironolactone (ALDACTONE) 25 MG tablet, TAKE 1 TABLET BY MOUTH DAILY, Disp: 90 tablet, Rfl: 3 .  losartan (COZAAR) 25 MG tablet, TAKE 1 TABLET (25 MG TOTAL) BY MOUTH AT BEDTIME., Disp: 90 tablet, Rfl: 3 .  losartan (COZAAR) 25 MG tablet, Take 1 tablet (25 mg total) at bedtime by mouth. Needs office visit, Disp: 90 tablet, Rfl: 3 .  Omega 3 1200 MG CAPS, Take 1 capsule by mouth 2 (two) times daily. , Disp: , Rfl:  .  pyridoxine (B-6) 100 MG tablet, Take 100 mg by mouth daily., Disp: , Rfl:   PAST MEDICAL HISTORY: Past Medical History:  Diagnosis Date  . Arthritis    JOINT PAIN RIGHT HAND  . BPH (benign prostatic hyperplasia)    [name]/elevated PSA, prostate biopsy x4 including one saturation biopsy, laser treatment 2/14; NOCTURIA  . CAD (coronary artery disease) 2013   cath 05/2012 showing 80-90% stenosis of a trifurcating diagonal #1, 70-80% stenosis of OM3 and 90% stenosis of distal LCx after OM3 - medical management, [place]  . Cardiomyopathy ([place])    dilated cardiomyopathy EF 30%, MUGA  EF 42% 08/2012  . Carotid artery occlusion    carotid artery bruit  . Chronic kidney disease    kidney stones -small passed.  . Diastolic dysfunction   . Heart murmur    as a child  . History of shingles   . Hyperlipidemia    statin intolerant  . Hypertension   . Lung mass    Stage 1B non-small cell lung CA s/p resection 2013  . PVC (premature ventricular contraction)   . Shingles   . Sigmoid diverticulosis   . Skin cancer    Multiple skin cancers   .  Squamous cell carcinoma, face    history of right face with mets to right upper cheek in 2004  and facial lymph node reoccurence post surgery with XRT  . Trigger finger    Bilateral    PAST SURGICAL HISTORY: Past Surgical History:  Procedure Laterality Date  . APPENDECTOMY    . APPENDECTOMY    . CARDIAC CATHETERIZATION  05/2012   Has blockage- Treated with Medications.  Cardiologist - Dr Radford Pax  . CARDIAC CATHETERIZATION Left 07/10/2016   Procedure: Left Heart Cath and Coronary Angiography;  Surgeon: Corey Skains, MD;  Location: [facility];  Service: Cardiovascular;  Laterality: Left;  . CATARACT EXTRACTION, BILATERAL    .  CORONARY ARTERY BYPASS GRAFT N/A 07/19/2016   Procedure: CORONARY ARTERY BYPASS GRAFTING (CABG) x 4 (LIMA to LAD, SVG to DIAGONAL, SVG to CIRCUMFLEX, SVG to PDA) with EVH from [place] and LEFT INTERNAL MAMMARY ARTERY;  Surgeon: Grace Isaac, MD;  Location: [facility];  Service: Open Heart Surgery;  Laterality: N/A;  . excision ot metastatic lymph node followed by radiation  2006   .  Marland Kitchen GREEN LIGHT LASER TURP (TRANSURETHRAL RESECTION OF PROSTATE N/A 02/08/2013   Procedure: GREEN LIGHT LASER TURP (TRANSURETHRAL RESECTION OF PROSTATE;  Surgeon: Ailene Rud, MD;  Location: [facility];  Service: Urology;  Laterality: N/A;  . HOLMIUM LASER APPLICATION Left 4/78/2956   Procedure: HOLMIUM LASER APPLICATION;  Surgeon: Ailene Rud, MD;  Location: [facility];  Service: Urology;  Laterality: Left;  . LYMPH NODE DISSECTION  06/16/2012   Procedure: LYMPH NODE DISSECTION;  Surgeon: Grace Isaac, MD;  Location: [place];  Service: Thoracic;;  . MOHS SURGERY  2004  . RIB PLATING Right 07/19/2016   Procedure: Right sternoclavicular joint plating;  Surgeon: Grace Isaac, MD;  Location: [place];  Service: Open Heart Surgery;  Laterality: Right;  . TEE WITHOUT CARDIOVERSION N/A 07/19/2016   Procedure: TRANSESOPHAGEAL  ECHOCARDIOGRAM (TEE);  Surgeon: Grace Isaac, MD;  Location: [place];  Service: Open Heart Surgery;  Laterality: N/A;  . TRANSURETHRAL RESECTION OF PROSTATE Left 08/22/2014   Procedure: TRANSURETHRAL RESECTION OF THE PROSTATE (TURP) CYSTOSCOPY, LEFT RETROGRADE PYLEGRAM,LEFT with STENT,URETEROSCOPY,PLACEMENT OF BACKSTOP,LASER FRAGMENTATION WITH BASKET EXTRACTION LEFT URETEROSCOPY;  Surgeon: Ailene Rud, MD;  Location: [facility];  Service: Urology;  Laterality: Left;  Marland Kitchen VIDEO BRONCHOSCOPY  06/16/2012   Procedure: VIDEO BRONCHOSCOPY;  Surgeon: Grace Isaac, MD;  Location: [place];  Service: Thoracic;  Laterality: N/A;  . VIDEO BRONCHOSCOPY  06/24/2012   Procedure: VIDEO BRONCHOSCOPY;  Surgeon: Grace Isaac, MD;  Location: [facility];  Service: Thoracic;  Laterality: N/A;    FAMILY HISTORY: Family History  Problem Relation Age of Onset  . Diabetes Father   . Psoriasis Child   . Heart disease Child        resuscitated from cardiac arrest from Brugada's syndrome  . Lung cancer Mother   . Heart disease Mother     SOCIAL HISTORY:  Social History   Socioeconomic History  . Marital status: Married    Spouse name: Not on file  . Number of children: Not on file  . Years of education: Not on file  . Highest education level: Not on file  Occupational History  . Not on file  Social Needs  . Financial resource strain: Not on file  . Food insecurity:    Worry: Not on file    Inability: Not on file  . Transportation needs:    Medical: Not on file    Non-medical: Not on file  Tobacco Use  . Smoking status: Former Smoker    Types: Cigarettes    Last attempt to quit: 12/30/1965    Years since quitting: 52.2  . Smokeless tobacco: Never Used  Substance and Sexual Activity  . Alcohol use: Yes    Alcohol/week: 3.0 - 3.6 oz    Types: 3 - 4 Glasses of wine, 2 Cans of beer per week    Comment: infrequently- "indulge heavly when I do"  . Drug use: No  . Sexual activity: Not on file    Lifestyle  . Physical activity:    Days  per week: Not on file    Minutes per session: Not on file  . Stress: Not on file  Relationships  . Social connections:    Talks on phone: Not on file    Gets together: Not on file    Attends religious service: Not on file    Active member of club or organization: Not on file    Attends meetings of clubs or organizations: Not on file    Relationship status: Not on file  . Intimate partner violence:    Fear of current or ex partner: Not on file    Emotionally abused: Not on file    Physically abused: Not on file    Forced sexual activity: Not on file  Other Topics Concern  . Not on file  Social History Narrative  . Not on file     PHYSICAL EXAM  Vitals:   03/25/18 1003  BP: 124/73  Pulse: 84  Resp: 18  Weight: 175 lb (79.4 kg)  Height: 5\' 4"  (1.626 m)    Body mass index is 30.04 kg/m.   General: The patient is well-developed and well-nourished and in no acute distress  Eyes:  Funduscopic exam shows normal optic discs and retinal vessels.  Neck: The neck is supple, no carotid bruits are noted.  The neck is nontender.  Cardiovascular: The heart has a regular rate and rhythm with a normal S1 and S2. There were no murmurs, gallops or rubs. Lungs are clear to auscultation.  Skin: Extremities are without significant edema.  Musculoskeletal:  Back is nontender  Neurologic Exam  Mental status: The patient is alert and oriented x 3 at the time of the examination. The patient has apparent normal recent and remote memory, with an apparently normal attention span and concentration ability.   Speech is normal.  Cranial nerves: Extraocular movements are full.   Facial symmetry is present. There is good facial sensation to soft touch bilaterally.Facial strength is normal.  Trapezius and sternocleidomastoid strength is normal. No dysarthria is noted.  The tongue is midline, and the patient has symmetric elevation of the soft palate. No  obvious hearing deficits are noted.  Motor:  Muscle bulk is normal.   Tone is normal.  In the left arm, strength is 4/5 in the biceps, triceps, deltoid, finger and wrist extensors and intrinsic hand muscles and 4+/5 in the finger and wrist flexors.  On the right, strength is 4+/5 except for 5/5 in the finger flexors and wrist flexors.   Is able to squat and rise 5 times without difficulty and he can get out of the chair without using his arms without difficulty.  Sensory: Sensory testing is intact to pinprick, soft touch and vibration sensation in all 4 extremities.  Coordination: Cerebellar testing reveals good finger-nose-finger and heel-to-shin bilaterally.  Gait and station: Station is normal.   Gait is normal. Tandem gait is mildly wide but normal for his age. Romberg is negative.   Reflexes: Deep tendon reflexes are 1+ in the brachial radialis, trace at the biceps and triceps bilaterally and 2+ at the knees.  Symmetric and normal bilaterally.   Plantar responses are flexor.    DIAGNOSTIC DATA (LABS, IMAGING, TESTING) - I reviewed patient records, labs, notes, testing and imaging myself where available.  Lab Results  Component Value Date   WBC 8.1 07/27/2016   HGB 8.6 (L) 07/27/2016   HCT 26.7 (L) 07/27/2016   MCV 94.0 07/27/2016   PLT 305 07/27/2016  Component Value Date/Time   NA 137 03/17/2017 0940   K 4.0 03/17/2017 0940   CL 103 03/17/2017 0940   CO2 26 03/17/2017 0940   GLUCOSE 164 (H) 03/17/2017 0940   BUN 27 (H) 03/17/2017 0940   CREATININE 1.11 03/17/2017 0940   CREATININE 0.99 12/09/2012 0800   CALCIUM 9.3 03/17/2017 0940   PROT 5.6 (L) 07/26/2016 0039   ALBUMIN 2.7 (L) 07/26/2016 0039   AST 31 07/26/2016 0039   ALT 39 07/26/2016 0039   ALKPHOS 90 07/26/2016 0039   BILITOT 1.5 (H) 07/26/2016 0039   GFRNONAA >60 03/17/2017 0940   GFRAA >60 03/17/2017 0940   Lab Results  Component Value Date   CHOL 157 03/17/2017   HDL 61 03/17/2017   LDLCALC 75  03/17/2017   LDLDIRECT 147.6 11/08/2013   TRIG 106 03/17/2017   CHOLHDL 2.6 03/17/2017   Lab Results  Component Value Date   HGBA1C 5.8 (H) 07/17/2016   No results found for: VITAMINB12 Lab Results  Component Value Date   TSH 1.645 07/22/2016       ASSESSMENT AND PLAN  Arm weakness - Plan: MR CERVICAL SPINE WO CONTRAST, NCV with EMG(electromyography)  Chronic left shoulder pain - Plan: MR CERVICAL SPINE WO CONTRAST, NCV with EMG(electromyography)  Cervical radiculopathy - Plan: MR CERVICAL SPINE WO CONTRAST, NCV with EMG(electromyography)  Cervical stenosis of spinal canal - Plan: MR CERVICAL SPINE WO CONTRAST  Numbness - Plan: MR CERVICAL SPINE WO CONTRAST, NCV with EMG(electromyography)   In summary, Mr. Guerrant is an 82 year old man with left greater than right arm weakness and left greater than right arm numbness and pain that has worsened over the past couple of years.   He does not note much neck pain and has just mildly reduced passive range of motion in the left shoulder.   There is weakness in both arms, worse on the left.  He does not have weakness in the legs.  An MRI from 11 or 12 years ago shows significant multilevel cervical degenerative changes including spinal stenosis at C5-C6 and C6C7.  The degenerative changes have likely worsened over the past decade and are likely contributing to his arm weakness and pain.  The hand weakness cannot be explained by changes on the 2007 MRI as he had no significant disease at C7-T1 or T1-T2.  The symptoms could be due to worsening spinal stenosis or to carpal tunnel/ulnar neuropathy.  He is scheduled to have an MRI of the shoulder already.  I will check an MRI of the cervical spine and a nerve conduction study/EMG to better characterize these issues..   I will see him when he returns for the EMG study and if the dysesthesias in the left arm or worsening, consider gabapentin.  He should call us back sooner if he has new or worsening  neurologic symptoms.  Thank you for asking me to see Mr. Broadnax.  Please let me know if I can be of further assistance with him or other patients in the future.   Richard A. Felecia Shelling, MD, [facility] 8/67/6720, 94:70 AM Certified in Neurology, Clinical Neurophysiology, Sleep Medicine, Pain Medicine and Neuroimaging  [facility] [address] [place], [place] [address] [phone]

## 2018-03-26 ENCOUNTER — Telehealth: Payer: Self-pay | Admitting: Neurology

## 2018-03-26 NOTE — Telephone Encounter (Signed)
Just an [place] with [name] [name] called me back and informed me that they do not have enough time on Saturday to add the Cervical along with the Shoulder. Once she receive the faxed order she will reach out to the patient to see if he wants to reschedule to have both of them together or come back another day for the Cervical.

## 2018-03-26 NOTE — Telephone Encounter (Signed)
Noted/fim 

## 2018-03-26 NOTE — Telephone Encounter (Signed)
Health team auth: [id number] (exp. 03/26/18 to 06/24/18)

## 2018-03-26 NOTE — Telephone Encounter (Signed)
I left a voicemail with [name] [name] at [facility] to return my call about adding a MRI Cervical spine wo contrast to his MRI shoulder that he is having on Saturday there.

## 2018-03-27 DIAGNOSIS — Z961 Presence of intraocular lens: Secondary | ICD-10-CM | POA: Diagnosis not present

## 2018-04-01 DIAGNOSIS — M542 Cervicalgia: Secondary | ICD-10-CM | POA: Diagnosis not present

## 2018-04-01 DIAGNOSIS — M25512 Pain in left shoulder: Secondary | ICD-10-CM | POA: Diagnosis not present

## 2018-04-03 DIAGNOSIS — G8322 Monoplegia of upper limb affecting left dominant side: Secondary | ICD-10-CM | POA: Diagnosis not present

## 2018-04-03 DIAGNOSIS — M75122 Complete rotator cuff tear or rupture of left shoulder, not specified as traumatic: Secondary | ICD-10-CM | POA: Diagnosis not present

## 2018-04-03 DIAGNOSIS — M25512 Pain in left shoulder: Secondary | ICD-10-CM | POA: Diagnosis not present

## 2018-04-13 ENCOUNTER — Telehealth: Payer: Self-pay | Admitting: Neurology

## 2018-04-13 NOTE — Telephone Encounter (Signed)
I personally reviewed his recent MRI of the cervical spine performed 04/01/2018.  It shows spinal stenosis at C5-C6 and C6-C7.  Additionally, there is multilevel foraminal narrowing.  The MRI was compared to another MRI dated 12/02/2006 and there are predominantly stable degenerative changes.  There is moderate or moderately severe foraminal narrowing to the left at C3-C4, C4-C5, C5-C6 and C6-C7 and moderate or moderately severe foraminal narrowing to the right at C2-C3, C3-C4, C4-C5, C5-C6 and C6-C7.  The spinal cord does not show any abnormal signal.

## 2018-04-14 ENCOUNTER — Inpatient Hospital Stay: Payer: PPO | Attending: Oncology | Admitting: Oncology

## 2018-04-14 ENCOUNTER — Telehealth: Payer: Self-pay | Admitting: Oncology

## 2018-04-14 VITALS — BP 92/51 | HR 69 | Temp 97.9°F | Resp 18 | Ht 64.0 in | Wt 168.6 lb

## 2018-04-14 DIAGNOSIS — I2510 Atherosclerotic heart disease of native coronary artery without angina pectoris: Secondary | ICD-10-CM | POA: Insufficient documentation

## 2018-04-14 DIAGNOSIS — C3412 Malignant neoplasm of upper lobe, left bronchus or lung: Secondary | ICD-10-CM | POA: Diagnosis not present

## 2018-04-14 DIAGNOSIS — Z85828 Personal history of other malignant neoplasm of skin: Secondary | ICD-10-CM | POA: Insufficient documentation

## 2018-04-14 DIAGNOSIS — Z951 Presence of aortocoronary bypass graft: Secondary | ICD-10-CM | POA: Insufficient documentation

## 2018-04-14 NOTE — Progress Notes (Signed)
  [facility] OFFICE PROGRESS NOTE   Diagnosis: Non-small cell lung cancer  INTERVAL HISTORY:   [name] [name] returns as scheduled.  A restaging chest CT 01/28/2018 was negative for malignancy.  Good appetite.  He reports close follow-up with Dr. Ronnald Ramp for removal of skin cancers.  He has limited mobility of the left shoulder and is considering a shoulder replacement.  Objective:  Vital signs in last 24 hours:  Blood pressure (!) 92/51, pulse 69, temperature 97.9 F (36.6 C), temperature source Oral, resp. rate 18, height 5\' 4"  (1.626 m), weight 168 lb 9.6 oz (76.5 kg), SpO2 97 %.    HEENT: Neck without mass Lymphatics: No cervical, supraclavicular, or axillary nodes Resp: Lungs clear bilaterally Cardio: Regular rate and rhythm GI: No hepatosplenomegaly Vascular: No leg edema  Skin: Right face scar without evidence of recurrent tumor   Medications: I have reviewed the patient's current medications.   Assessment/plan:  1.Squamous cell carcinoma of the right cheek, status post Mohs surgery in 2004  2. Local recurrence of squamous cell carcinoma and a right face lymph node in 2006, status post surgical excision followed by radiation  3. Left hilar fullness on a chest x-ray 02/26/2012-stable compared to a chest x-ray from February of 2012 and new compared to a chest x-ray 2007 . A chest CT on 05/05/2012 confirmed a left upper lung nodule consistent with a primary bronchogenic carcinoma. A PET scan on 05/18/12 confirmed a hypermetabolic left upper lung nodule with no evidence of thoracic nodal or extrathoracic hypermetabolic disease  -He is status post a left upper lobectomy on 06/16/2012 with the pathology confirming a 3.8 cm squamous cell carcinoma (T2a, N0)  -Restaging CT 06/16/2014 without evidence of recurrent disease  -Restaging CT 12/15/2014 without evidence of recurrent disease -Restaging CT 12/06/2015-no evidence of recurrent disease -Restaging chest CT  01/02/2017-no evidence of recurrent disease -Restaging chest CT 01/28/2018-no evidence of recurrent disease 4. History of BPH , status post a laser vaporization of the prostate in February 2014 and TUR August 2015  5. History of multiple skin cancers including basal cell carcinoma and squamous cell carcinomas  6. coronary artery disease, decreased left ventricular ejection , Status post coronary artery bypass surgery July 2017  Disposition: He remains in clinical remission from skin cancer and lung cancer.  He would like to continue follow-up at the Cancer center.  Mr. Stgermaine will return for an office visit in 1 year.  He will continue surveillance imaging with Dr. Servando Snare.  15 minutes were spent with the patient today.  The majority of the time was used for counseling and coordination of care.  Betsy Coder, MD  04/14/2018  10:01 AM

## 2018-04-14 NOTE — Telephone Encounter (Signed)
Appts scheduled AVS/Calendar printed per 4/16 los °

## 2018-04-16 DIAGNOSIS — Z85828 Personal history of other malignant neoplasm of skin: Secondary | ICD-10-CM | POA: Diagnosis not present

## 2018-04-16 DIAGNOSIS — L570 Actinic keratosis: Secondary | ICD-10-CM | POA: Diagnosis not present

## 2018-04-16 DIAGNOSIS — L821 Other seborrheic keratosis: Secondary | ICD-10-CM | POA: Diagnosis not present

## 2018-04-21 ENCOUNTER — Ambulatory Visit (INDEPENDENT_AMBULATORY_CARE_PROVIDER_SITE_OTHER): Payer: PPO | Admitting: Neurology

## 2018-04-21 DIAGNOSIS — R29898 Other symptoms and signs involving the musculoskeletal system: Secondary | ICD-10-CM

## 2018-04-21 DIAGNOSIS — G5603 Carpal tunnel syndrome, bilateral upper limbs: Secondary | ICD-10-CM | POA: Diagnosis not present

## 2018-04-21 DIAGNOSIS — M5412 Radiculopathy, cervical region: Secondary | ICD-10-CM | POA: Diagnosis not present

## 2018-04-21 DIAGNOSIS — R200 Anesthesia of skin: Secondary | ICD-10-CM

## 2018-04-21 DIAGNOSIS — M25512 Pain in left shoulder: Principal | ICD-10-CM

## 2018-04-21 DIAGNOSIS — Z0289 Encounter for other administrative examinations: Secondary | ICD-10-CM

## 2018-04-21 DIAGNOSIS — G8929 Other chronic pain: Secondary | ICD-10-CM

## 2018-04-21 NOTE — Progress Notes (Signed)
Full Name: [name] [name] Gender: Male MRN #: [id number] Date of Birth: January 03, 2036    Visit Date: 04/21/18 09:05 Age: 82 Years 0 Months Old Examining Physician: Arlice Colt, MD  Referring Physician: Felecia Shelling, MD    History: [name] [name] is an 82 year old man with bilateral hand numbness and decreased grips.   MRI of the cervical spine shows spinal stenosis at C5-C6 and C6-C7 and multilevel foraminal narrowing.  Nerve conduction studies: Bilateral median motor responses were markedly prolonged and mildly to moderately reduced in amplitude.  Bilateral median sensory responses were absent.   Bilateral ulnar motor responses had normal distal latencies, amplitudes and conduction velocities.    Bilateral ulnar sensory responses were normal.  Bilateral radial sensory responses were normal.  Needle EMG: Needle EMG of selected muscles of both arms was performed.  C7 innervated muscles on the left showed mild chronic denervation changes.  C6 innervated muscles on the right showed an increased number polyphasic motor units but normal recruitment.  Bilateral APB muscles showed mild chronic denervation.  There was no abnormal spontaneous activity.  Impression: 1.   Moderately severe bilateral median neuropathies (carpal tunnel syndromes ) at the wrist. 2.   Mild left chronic C7 radiculopathy.  Possible mild right chronic C6 radiculopathy.    Elisabeth Strom A. Felecia Shelling, MD, PhD, FAAN Certified in Neurology, Clinical Neurophysiology, Sleep Medicine, Pain Medicine and Neuroimaging Director, [place] [facility] [address] [place], [place] [address] [phone]   Clinical note: He was advised to obtain carpal tunnel wrist splints to wear at night.  If he does not do better after a month or so consider referral to surgery. --RAS       MNC    Nerve / Sites Muscle Latency Ref. Amplitude Ref. Rel Amp Segments Distance  Velocity Ref. Area    ms ms mV mV %  cm m/s m/s mVms  L Median - APB     Wrist APB 9.2 ?4.4 1.5 ?4.0 100 Wrist - APB 7   6.2     Upper arm APB 13.5  1.4  94.3 Upper arm - Wrist 21 49 ?49 6.0  R Median - APB     Wrist APB 8.4 ?4.4 3.0 ?4.0 100 Wrist - APB 7   11.1     Upper arm APB 12.6  2.5  84 Upper arm - Wrist 21 50 ?49 9.6  L Ulnar - ADM     Wrist ADM 2.4 ?3.3 11.3 ?6.0 100 Wrist - ADM 7   25.7     B.Elbow ADM 6.0  10.0  88.2 B.Elbow - Wrist 20 56 ?49 24.3     A.Elbow ADM 7.8  9.7  97.4 A.Elbow - B.Elbow 10 56 ?49 22.6         A.Elbow - Wrist      R Ulnar - ADM     Wrist ADM 2.5 ?3.3 10.7 ?6.0 100 Wrist - ADM 7   27.2     B.Elbow ADM 6.0  10.0  93.1 B.Elbow - Wrist 20 56 ?49 25.7     A.Elbow ADM 7.9  9.5  95.3 A.Elbow - B.Elbow 10 53 ?49 24.8         A.Elbow - Wrist                 SNC    Nerve / Sites Rec. Site Peak Lat Ref.  Amp Ref. Segments Distance  ms ms V V  cm  L Radial - Anatomical snuff box (Forearm)     Forearm Wrist 2.7 ?2.9 10 ?15 Forearm - Wrist 10  R Radial - Anatomical snuff box (Forearm)     Forearm Wrist 2.7 ?2.9 7 ?15 Forearm - Wrist 10  L Median - Orthodromic (Dig II, Mid palm)     Dig II Wrist NR ?3.4 NR ?10 Dig II - Wrist 13  R Median - Orthodromic (Dig II, Mid palm)     Dig II Wrist NR ?3.4 NR ?10 Dig II - Wrist 13  L Ulnar - Orthodromic, (Dig V, Mid palm)     Dig V Wrist 2.7 ?3.1 5 ?5 Dig V - Wrist 11  R Ulnar - Orthodromic, (Dig V, Mid palm)     Dig V Wrist 2.8 ?3.1 9 ?5 Dig V - Wrist 80                 F  Wave    Nerve F Lat Ref.   ms ms  L Ulnar - ADM 26.6 ?32.0  R Ulnar - ADM 26.3 ?32.0         EMG full       EMG Summary Table    Spontaneous MUAP Recruitment  Muscle IA Fib PSW Fasc Other Amp Dur. Poly Pattern  R. Deltoid Normal None None None _______ Normal Normal Normal Normal  R. Triceps brachii Normal None None None _______ Normal Normal Normal Normal  R. Biceps brachii Normal None None None _______ Normal Normal 1+ Normal  R.  Extensor digitorum communis Normal None None None _______ Normal Normal 1+ Normal  R. First dorsal interosseous Normal None None None _______ Normal Normal Normal Normal  R. Abductor pollicis brevis Normal None None None _______ Normal Increased 1+ Reduced  L. Deltoid Normal None None None _______ Normal Normal Normal Normal  L. Triceps brachii Normal None None None _______ Increased Increased 1+ Reduced  L. Biceps brachii Normal None None None _______ Normal Normal Normal Normal  L. Extensor digitorum communis Normal None None None _______ Increased Increased 1+ Reduced  L. First dorsal interosseous Normal None None None _______ Normal Normal Normal Normal  L. Abductor pollicis brevis Normal None None None _______ Normal Increased 1+ Reduced

## 2018-04-21 NOTE — Patient Instructions (Signed)
The study today showed you have moderately severe carpal tunnel syndrome (median neuropathies) at both wrists.  Try wearing a wrist splint at night.    If not better let us know and we can refer you to a surgeon

## 2018-05-18 DIAGNOSIS — M75122 Complete rotator cuff tear or rupture of left shoulder, not specified as traumatic: Secondary | ICD-10-CM | POA: Diagnosis not present

## 2018-05-18 DIAGNOSIS — I255 Ischemic cardiomyopathy: Secondary | ICD-10-CM | POA: Diagnosis not present

## 2018-05-18 DIAGNOSIS — G5603 Carpal tunnel syndrome, bilateral upper limbs: Secondary | ICD-10-CM | POA: Diagnosis not present

## 2018-05-18 DIAGNOSIS — Z0000 Encounter for general adult medical examination without abnormal findings: Secondary | ICD-10-CM | POA: Diagnosis not present

## 2018-05-18 DIAGNOSIS — Z1389 Encounter for screening for other disorder: Secondary | ICD-10-CM | POA: Diagnosis not present

## 2018-05-18 DIAGNOSIS — I25810 Atherosclerosis of coronary artery bypass graft(s) without angina pectoris: Secondary | ICD-10-CM | POA: Diagnosis not present

## 2018-05-18 DIAGNOSIS — I480 Paroxysmal atrial fibrillation: Secondary | ICD-10-CM | POA: Diagnosis not present

## 2018-05-18 DIAGNOSIS — M5412 Radiculopathy, cervical region: Secondary | ICD-10-CM | POA: Diagnosis not present

## 2018-05-18 DIAGNOSIS — I10 Essential (primary) hypertension: Secondary | ICD-10-CM | POA: Diagnosis not present

## 2018-05-20 ENCOUNTER — Ambulatory Visit: Payer: PPO | Admitting: Neurology

## 2018-05-25 ENCOUNTER — Other Ambulatory Visit (HOSPITAL_COMMUNITY): Payer: Self-pay | Admitting: Cardiology

## 2018-06-10 ENCOUNTER — Other Ambulatory Visit (HOSPITAL_COMMUNITY): Payer: Self-pay | Admitting: Internal Medicine

## 2018-07-29 DIAGNOSIS — Z85828 Personal history of other malignant neoplasm of skin: Secondary | ICD-10-CM | POA: Diagnosis not present

## 2018-07-29 DIAGNOSIS — L570 Actinic keratosis: Secondary | ICD-10-CM | POA: Diagnosis not present

## 2018-08-13 DIAGNOSIS — R0602 Shortness of breath: Secondary | ICD-10-CM | POA: Diagnosis not present

## 2018-08-13 DIAGNOSIS — I10 Essential (primary) hypertension: Secondary | ICD-10-CM | POA: Diagnosis not present

## 2018-08-13 DIAGNOSIS — R42 Dizziness and giddiness: Secondary | ICD-10-CM | POA: Diagnosis not present

## 2018-08-13 DIAGNOSIS — I2510 Atherosclerotic heart disease of native coronary artery without angina pectoris: Secondary | ICD-10-CM | POA: Diagnosis not present

## 2018-08-13 DIAGNOSIS — I5022 Chronic systolic (congestive) heart failure: Secondary | ICD-10-CM | POA: Diagnosis not present

## 2018-08-29 ENCOUNTER — Other Ambulatory Visit (HOSPITAL_COMMUNITY): Payer: Self-pay | Admitting: Cardiology

## 2018-09-03 ENCOUNTER — Other Ambulatory Visit (HOSPITAL_COMMUNITY): Payer: Self-pay | Admitting: Cardiology

## 2018-09-09 DIAGNOSIS — R0602 Shortness of breath: Secondary | ICD-10-CM | POA: Diagnosis not present

## 2018-09-09 DIAGNOSIS — I10 Essential (primary) hypertension: Secondary | ICD-10-CM | POA: Diagnosis not present

## 2018-09-09 DIAGNOSIS — I5022 Chronic systolic (congestive) heart failure: Secondary | ICD-10-CM | POA: Diagnosis not present

## 2018-09-09 DIAGNOSIS — I2510 Atherosclerotic heart disease of native coronary artery without angina pectoris: Secondary | ICD-10-CM | POA: Diagnosis not present

## 2018-09-11 ENCOUNTER — Other Ambulatory Visit (HOSPITAL_COMMUNITY): Payer: Self-pay | Admitting: Internal Medicine

## 2018-10-22 DIAGNOSIS — R3915 Urgency of urination: Secondary | ICD-10-CM | POA: Diagnosis not present

## 2018-10-22 DIAGNOSIS — N35011 Post-traumatic bulbous urethral stricture: Secondary | ICD-10-CM | POA: Diagnosis not present

## 2018-10-28 DIAGNOSIS — Z23 Encounter for immunization: Secondary | ICD-10-CM | POA: Diagnosis not present

## 2018-11-18 DIAGNOSIS — I25810 Atherosclerosis of coronary artery bypass graft(s) without angina pectoris: Secondary | ICD-10-CM | POA: Diagnosis not present

## 2018-11-18 DIAGNOSIS — I255 Ischemic cardiomyopathy: Secondary | ICD-10-CM | POA: Diagnosis not present

## 2018-11-18 DIAGNOSIS — E7800 Pure hypercholesterolemia, unspecified: Secondary | ICD-10-CM | POA: Diagnosis not present

## 2018-11-18 DIAGNOSIS — I10 Essential (primary) hypertension: Secondary | ICD-10-CM | POA: Diagnosis not present

## 2018-11-18 DIAGNOSIS — N400 Enlarged prostate without lower urinary tract symptoms: Secondary | ICD-10-CM | POA: Diagnosis not present

## 2018-11-18 DIAGNOSIS — M12812 Other specific arthropathies, not elsewhere classified, left shoulder: Secondary | ICD-10-CM | POA: Diagnosis not present

## 2018-11-18 DIAGNOSIS — Z789 Other specified health status: Secondary | ICD-10-CM | POA: Diagnosis not present

## 2018-11-18 DIAGNOSIS — G5603 Carpal tunnel syndrome, bilateral upper limbs: Secondary | ICD-10-CM | POA: Diagnosis not present

## 2018-11-23 DIAGNOSIS — I10 Essential (primary) hypertension: Secondary | ICD-10-CM | POA: Diagnosis not present

## 2018-11-23 DIAGNOSIS — I480 Paroxysmal atrial fibrillation: Secondary | ICD-10-CM | POA: Diagnosis not present

## 2018-11-23 DIAGNOSIS — I25810 Atherosclerosis of coronary artery bypass graft(s) without angina pectoris: Secondary | ICD-10-CM | POA: Diagnosis not present

## 2018-11-23 DIAGNOSIS — Z85118 Personal history of other malignant neoplasm of bronchus and lung: Secondary | ICD-10-CM | POA: Diagnosis not present

## 2018-11-23 DIAGNOSIS — I255 Ischemic cardiomyopathy: Secondary | ICD-10-CM | POA: Diagnosis not present

## 2018-11-23 DIAGNOSIS — N400 Enlarged prostate without lower urinary tract symptoms: Secondary | ICD-10-CM | POA: Diagnosis not present

## 2018-12-04 ENCOUNTER — Other Ambulatory Visit: Payer: Self-pay | Admitting: *Deleted

## 2018-12-04 DIAGNOSIS — Z85118 Personal history of other malignant neoplasm of bronchus and lung: Secondary | ICD-10-CM

## 2018-12-17 DIAGNOSIS — I10 Essential (primary) hypertension: Secondary | ICD-10-CM | POA: Diagnosis not present

## 2018-12-17 DIAGNOSIS — I255 Ischemic cardiomyopathy: Secondary | ICD-10-CM | POA: Diagnosis not present

## 2018-12-17 DIAGNOSIS — I480 Paroxysmal atrial fibrillation: Secondary | ICD-10-CM | POA: Diagnosis not present

## 2018-12-17 DIAGNOSIS — N400 Enlarged prostate without lower urinary tract symptoms: Secondary | ICD-10-CM | POA: Diagnosis not present

## 2018-12-17 DIAGNOSIS — Z85118 Personal history of other malignant neoplasm of bronchus and lung: Secondary | ICD-10-CM | POA: Diagnosis not present

## 2018-12-17 DIAGNOSIS — I25810 Atherosclerosis of coronary artery bypass graft(s) without angina pectoris: Secondary | ICD-10-CM | POA: Diagnosis not present

## 2018-12-28 DIAGNOSIS — L821 Other seborrheic keratosis: Secondary | ICD-10-CM | POA: Diagnosis not present

## 2018-12-28 DIAGNOSIS — Z85828 Personal history of other malignant neoplasm of skin: Secondary | ICD-10-CM | POA: Diagnosis not present

## 2018-12-28 DIAGNOSIS — L570 Actinic keratosis: Secondary | ICD-10-CM | POA: Diagnosis not present

## 2019-01-28 ENCOUNTER — Ambulatory Visit
Admission: RE | Admit: 2019-01-28 | Discharge: 2019-01-28 | Disposition: A | Discharge status: Home / Self Care | Payer: PPO | Source: Ambulatory Visit | Attending: Cardiothoracic Surgery | Admitting: Cardiothoracic Surgery

## 2019-01-28 ENCOUNTER — Ambulatory Visit: Payer: PPO | Admitting: Cardiothoracic Surgery

## 2019-01-28 VITALS — BP 150/84 | HR 73 | Resp 20 | Ht 64.0 in | Wt 176.0 lb

## 2019-01-28 DIAGNOSIS — Z902 Acquired absence of lung [part of]: Secondary | ICD-10-CM

## 2019-01-28 DIAGNOSIS — Z85118 Personal history of other malignant neoplasm of bronchus and lung: Secondary | ICD-10-CM

## 2019-01-28 NOTE — Progress Notes (Signed)
[address]       [place] [address]             [phone]      [facility] Medical Record #[id number] Date of Birth: 01-Apr-1936  Referring: Corey Skains, MD Primary Care: Lavone Orn, MD  Chief Complaint:   POST OP FOLLOW UP 07/19/2016  OPERATIVE REPORT PREOPERATIVE DIAGNOSIS:  Three-vessel coronary artery disease with ischemic cardiomyopathy, ejection fraction 20%. POSTOPERATIVE DIAGNOSIS:  Three-vessel coronary artery disease with ischemic cardiomyopathy, ejection fraction 20%. Nonunion of the right sternoclavicular joint. PROCEDURE:  Coronary artery bypass grafting x4 with left internal mammary to the left anterior descending coronary artery, reverse saphenous vein graft to the diagonal coronary artery, reverse saphenous vein graft to the circumflex coronary artery, reverse saphenous vein graft to the posterior descending coronary artery with right thigh and calf greater saphenous endoscopic vein harvesting. Plating of right sternoclavicular joint SURGEON:  Lanelle Bal, MD.  06/19/2012  OPERATIVE REPORT  PREOPERATIVE DIAGNOSIS: Left upper lobe lung mass.  POSTOPERATIVE DIAGNOSES: Left upper lobe lung mass, squamous cell  carcinoma, non-small cell carcinoma by frozen section.  PROCEDURE PERFORMED: Video bronchoscopy, left video-assisted  thoracoscopy with mini thoracotomy, resection of left upper lobe and  portion of superior segment left lower lobe and lymph node dissection.   Stage IB, (pT2a,pN0,cM0 SQUAMOUS CELL CARCINOMA, 3.8 CM.)   History of Present Illness:    Patient comes to the office today in follow-up low-dose CT scan of the chest for follow-up on his previous history of left upper lobectomy for stage Ib carcinoma of the lung.,  Resected 2013.  In 2017 he underwent coronary artery bypass grafting for three-vessel coronary artery disease and severe LV dysfunction and dilated cardiomyopathy.  Notes that  he continues to be stable as far as cardiac and pulmonary symptoms.  He does get short of breath if he walks up a steep hill but is able to carry on daily activities without shortness of breath or chest pain.  He denies pedal edema orthopnea or angina.     Past Medical History:  Diagnosis Date  . Arthritis    JOINT PAIN RIGHT HAND  . BPH (benign prostatic hyperplasia)    [name]/elevated PSA, prostate biopsy x4 including one saturation biopsy, laser treatment 2/14; NOCTURIA  . CAD (coronary artery disease) 2013   cath 05/2012 showing 80-90% stenosis of a trifurcating diagonal #1, 70-80% stenosis of OM3 and 90% stenosis of distal LCx after OM3 - medical management, [place]  . Cardiomyopathy ([place])    dilated cardiomyopathy EF 30%, MUGA  EF 42% 08/2012  . Carotid artery occlusion    carotid artery bruit  . Chronic kidney disease    kidney stones -small passed.  . Diastolic dysfunction   . Heart murmur    as a child  . History of shingles   . Hyperlipidemia    statin intolerant  . Hypertension   . Lung mass    Stage 1B non-small cell lung CA s/p resection 2013  . PVC (premature ventricular contraction)   . Shingles   . Sigmoid diverticulosis   . Skin cancer    Multiple skin cancers   . Squamous cell carcinoma, face    history of right face with mets to right upper cheek in 2004  and facial lymph node reoccurence post surgery with XRT  . Trigger finger    Bilateral     Social History   Tobacco Use  Smoking  Status Former Smoker  . Types: Cigarettes  . Last attempt to quit: 12/30/1965  . Years since quitting: 53.1  Smokeless Tobacco Never Used    Social History   Substance and Sexual Activity  Alcohol Use Yes  . Alcohol/week: 5.0 - 6.0 standard drinks  . Types: 3 - 4 Glasses of wine, 2 Cans of beer per week   Comment: infrequently- "indulge heavly when I do"     Allergies  Allergen Reactions  . Ramipril Cough  . Statins     Confusion Muscle pain     Current  Outpatient Medications  Medication Sig Dispense Refill  . Ascorbic Acid (VITAMIN C) 1000 MG tablet Take 1,000 mg by mouth daily.    Marland Kitchen aspirin 81 MG tablet Take 81 mg by mouth daily.    Marland Kitchen b complex vitamins tablet Take 1 tablet by mouth daily.    . carvedilol (COREG) 6.25 MG tablet TAKE 1 TABLET (6.25 MG TOTAL) BY MOUTH 2 (TWO) TIMES DAILY. NEEDS OFFICE VISIT [phone] 60 tablet 0  . ezetimibe (ZETIA) 10 MG tablet Take 10 mg by mouth daily.    . Flaxseed, Linseed, (FLAXSEED OIL PO) Take by mouth.    Marland Kitchen GARLIC PO Take by mouth.    . losartan (COZAAR) 25 MG tablet TAKE 1 TABLET (25 MG TOTAL) BY MOUTH AT BEDTIME. 90 tablet 3  . Multiple Vitamin (MULTIVITAMIN WITH MINERALS) TABS tablet Take 1 tablet by mouth daily.    Marland Kitchen pyridoxine (B-6) 100 MG tablet Take 100 mg by mouth daily.    . Red Yeast Rice Extract 600 MG CAPS Take 1 capsule by mouth 2 (two) times daily.    Marland Kitchen spironolactone (ALDACTONE) 25 MG tablet Take 1 tablet (25 mg total) by mouth daily. Call patient for office visit [phone] 90 tablet 0   No current facility-administered medications for this visit.     Family History  Problem Relation Age of Onset  . Diabetes Father   . Psoriasis Child   . Heart disease Child        resuscitated from cardiac arrest from Brugada's syndrome  . Lung cancer Mother   . Heart disease Mother    Review of Systems  Constitutional: Negative.   HENT: Negative.   Eyes: Negative.   Respiratory: Positive for shortness of breath. Negative for cough, hemoptysis, sputum production and wheezing.   Cardiovascular: Negative for chest pain, orthopnea, claudication, leg swelling and PND.  Gastrointestinal: Negative.   Genitourinary: Positive for frequency. Negative for dysuria, flank pain, hematuria and urgency.  Musculoskeletal: Negative.   Neurological: Negative.   Endo/Heme/Allergies: Negative.   Psychiatric/Behavioral: Negative.     Physical Exam: BP (!) 150/84   Pulse 73   Resp 20   Ht 5\' 4"   (1.626 m)   Wt 176 lb (79.8 kg)   SpO2 95% Comment: RA  BMI 30.21 kg/m  General appearance: alert, cooperative, appears stated age and no distress Head: Normocephalic, without obvious abnormality, atraumatic Neck: no adenopathy, no carotid bruit, no JVD, supple, symmetrical, trachea midline and thyroid not enlarged, symmetric, no tenderness/mass/nodules Lymph nodes: Cervical, supraclavicular, and axillary nodes normal. Resp: clear to auscultation bilaterally Back: symmetric, no curvature. ROM normal. No CVA tenderness. GI: soft, non-tender; bowel sounds normal; no masses,  no organomegaly Extremities: extremities normal, atraumatic, no cyanosis or edema and Homans sign is negative, no sign of DVT Neurologic: Grossly normal     Diagnostic Studies & Laboratory data:     Recent Radiology Findings: Ct Chest Wo  Contrast  Result Date: 01/28/2019 CLINICAL DATA:  History of left upper lobe lung cancer 5 years ago. Status post left upper lobectomy. Follow-up exam. EXAM: CT CHEST WITHOUT CONTRAST TECHNIQUE: Multidetector CT imaging of the chest was performed following the standard protocol without IV contrast. COMPARISON:  01/28/2018 FINDINGS: Cardiovascular: Mild cardiac enlargement. No pericardial effusion. Previous median sternotomy and CABG procedure. Aortic atherosclerosis. Main pulmonary artery appears increased in caliber measuring 3.6 cm. Mediastinum/Nodes: Small nodules in left lobe of thyroid gland. Right lobe of thyroid is either surgically absent or atrophic. The trachea appears patent and is midline. Normal appearance of the esophagus. No axillary or mediastinal adenopathy. Lungs/Pleura: No pleural effusion. Status post left upper lobectomy with compensatory hyperexpansion of the left lower lobe. There is chronic elevation of the left hemidiaphragm. Scarring identified within the periphery of the left lower lung. Mild scarring and bronchiectasis within the anteromedial right lung apex. No  suspicious pulmonary nodules identified. Upper Abdomen: There is a low-density structure in the posterior dome of liver measuring 1.2 cm, image 122/2. Unchanged from previous exam favoring a benign cysts. The adrenal glands appear normal. Musculoskeletal: There is spondylosis identified within the thoracic spine. No aggressive lytic or sclerotic bone lesions. IMPRESSION: 1. Stable exam. Status post left upper lobectomy. No findings to suggest local recurrence of disease or metastatic disease in the chest. 2. Aortic atherosclerosis.  Status post CABG procedure. 3.  Aortic Atherosclerosis (ICD10-I70.0). Electronically Signed   By: Kerby Moors M.D.   On: 01/28/2019 09:30       Ct Chest Wo Contrast  Result Date: 01/28/2018 CLINICAL DATA:  83 year old male with history of left upper lobectomy for lung cancer in 2015. Former smoker (quit 55 years ago). EXAM: CT CHEST WITHOUT CONTRAST TECHNIQUE: Multidetector CT imaging of the chest was performed following the standard protocol without IV contrast. COMPARISON:  Chest CT 01/02/2017. FINDINGS: Cardiovascular: Heart size is borderline enlarged. There is no significant pericardial fluid, thickening or pericardial calcification. There is aortic atherosclerosis, as well as atherosclerosis of the great vessels of the mediastinum and the coronary arteries, including calcified atherosclerotic plaque in the left main, left anterior descending, left circumflex and right coronary arteries. Status post median sternotomy for CABG including LIMA to the LAD. Dilatation of the pulmonic trunk (3.6 cm in diameter). Mediastinum/Nodes: No pathologically enlarged mediastinal or hilar lymph nodes. Please note that accurate exclusion of hilar adenopathy is limited on noncontrast CT scans. Esophagus is unremarkable in appearance. No axillary lymphadenopathy. Lungs/Pleura: Status post left upper lobectomy. Compensatory hyperexpansion of the left lower lobe. Chronic elevation of the left  hemidiaphragm. Areas of mild postoperative scarring are again noted throughout the left lower lobe. No suspicious appearing pulmonary nodules or masses are noted. No acute consolidative airspace disease. No pleural effusions. Upper Abdomen: 11 mm low-attenuation lesion in segment 7 of the liver, incompletely characterized on today's noncontrast CT examination, but stable to the prior study, likely a cyst. Aortic atherosclerosis. Colonic diverticulosis in the proximal descending colon. Musculoskeletal: There are no aggressive appearing lytic or blastic lesions noted in the visualized portions of the skeleton. Median sternotomy wires. IMPRESSION: 1. Status post left upper lobectomy. No findings to suggest local recurrence of disease or metastatic disease in the thorax. 2. Aortic atherosclerosis, in addition to left main and 3 vessel coronary artery disease. Status post median sternotomy for CABG including LIMA to the LAD. 3. Additional incidental findings, as above. Aortic Atherosclerosis (ICD10-I70.0). Electronically Signed   By: Vinnie Langton M.D.   On:  01/28/2018 12:06  I have independently reviewed the above radiology studies  and reviewed the findings with the patient.     Ct Chest Wo Contrast  Result Date: 01/02/2017 CLINICAL DATA:  Stage IB squamous cell lung carcinoma of the left upper lobe status post left upper lobectomy 06/16/2012, presenting for restaging. No current symptoms are reported. Remote smoking history. EXAM: CT CHEST WITHOUT CONTRAST TECHNIQUE: Multidetector CT imaging of the chest was performed following the standard protocol without IV contrast. COMPARISON:  07/18/2016 chest CT.  08/26/2016 chest radiograph. FINDINGS: Cardiovascular: Stable mild cardiomegaly. No significant pericardial fluid/thickening. Left main, left anterior descending, left circumflex and right coronary atherosclerosis status post CABG with ascending aortic and left internal mammary bypass grafts. Atherosclerotic  nonaneurysmal thoracic aorta. Stable enlarged main pulmonary artery (3.5 cm diameter). Mediastinum/Nodes: No discrete thyroid nodules. Unremarkable esophagus. No pathologically enlarged axillary, mediastinal or gross hilar lymph nodes, noting limited sensitivity for the detection of hilar adenopathy on this noncontrast study. Lungs/Pleura: No pneumothorax. No pleural effusion. Stable volume loss in the left hemithorax status post left upper lobectomy. Peripheral basilar right lower lobe 2 mm solid pulmonary nodule (series 4/ image 102) is stable back to 12/15/2014 and considered benign. Stable mild postsurgical scarring in the basilar left lower lobe. No acute consolidative airspace disease, lung masses or new significant pulmonary nodules. Upper abdomen: Simple 1.1 cm posterior upper right liver cyst. Clustered nonobstructing stones in the lower left kidney measuring up to 4 mm. Left colonic diverticulosis. Musculoskeletal: No aggressive appearing focal osseous lesions. Surgical plate with interlocking screws in the anterior right sternoclavicular joint. Sternotomy wires appear aligned and intact. Moderate thoracic spondylosis. Mild gynecomastia, asymmetric to the left, new. IMPRESSION: 1. No evidence of local tumor recurrence status post left upper lobectomy. 2. No evidence of metastatic disease in the chest. 3. New mild gynecomastia, asymmetric to the left. 4. Stable mild cardiomegaly. Stable dilated main pulmonary artery suggesting chronic pulmonary arterial hypertension. 5. Aortic atherosclerosis. Left main and 3 vessel coronary atherosclerosis status post CABG . 6. Nonobstructing lower left nephrolithiasis. Electronically Signed   By: Ilona Sorrel M.D.   On: 01/02/2017 11:49      Recent Lab Findings: Lab Results  Component Value Date   WBC 8.1 07/27/2016   HGB 8.6 (L) 07/27/2016   HCT 26.7 (L) 07/27/2016   PLT 305 07/27/2016   GLUCOSE 164 (H) 03/17/2017   CHOL 157 03/17/2017   TRIG 106 03/17/2017     HDL 61 03/17/2017   LDLDIRECT 147.6 11/08/2013   LDLCALC 75 03/17/2017   ALT 39 07/26/2016   AST 31 07/26/2016   NA 137 03/17/2017   K 4.0 03/17/2017   CL 103 03/17/2017   CREATININE 1.11 03/17/2017   BUN 27 (H) 03/17/2017   CO2 26 03/17/2017   TSH 1.645 07/22/2016   INR 1.50 (H) 07/19/2016   HGBA1C 5.8 (H) 07/17/2016      Assessment / Plan:   Patient remains clinically stable after coronary artery bypass grafting with severe LV dysfunction in 2017 and resection of stage Ib non-small cell carcinoma of the left upper lobe with left upper lobectomy in 2013. -CT scan today shows no evidence of recurrence  Patient desires to come back in 1 year with follow-up low-dose CT of the chest, will plan to see him back at that time   Grace Isaac MD      [address] [place] [address] Office [phone]   Beeper [phone]  01/28/2019 9:40 AM

## 2019-01-29 DIAGNOSIS — N400 Enlarged prostate without lower urinary tract symptoms: Secondary | ICD-10-CM | POA: Diagnosis not present

## 2019-01-29 DIAGNOSIS — I25810 Atherosclerosis of coronary artery bypass graft(s) without angina pectoris: Secondary | ICD-10-CM | POA: Diagnosis not present

## 2019-01-29 DIAGNOSIS — Z85118 Personal history of other malignant neoplasm of bronchus and lung: Secondary | ICD-10-CM | POA: Diagnosis not present

## 2019-01-29 DIAGNOSIS — I10 Essential (primary) hypertension: Secondary | ICD-10-CM | POA: Diagnosis not present

## 2019-01-29 DIAGNOSIS — I255 Ischemic cardiomyopathy: Secondary | ICD-10-CM | POA: Diagnosis not present

## 2019-01-29 DIAGNOSIS — I480 Paroxysmal atrial fibrillation: Secondary | ICD-10-CM | POA: Diagnosis not present

## 2019-02-03 DIAGNOSIS — L570 Actinic keratosis: Secondary | ICD-10-CM | POA: Diagnosis not present

## 2019-02-03 DIAGNOSIS — Z85828 Personal history of other malignant neoplasm of skin: Secondary | ICD-10-CM | POA: Diagnosis not present

## 2019-02-03 DIAGNOSIS — D2272 Melanocytic nevi of left lower limb, including hip: Secondary | ICD-10-CM | POA: Diagnosis not present

## 2019-02-03 DIAGNOSIS — D692 Other nonthrombocytopenic purpura: Secondary | ICD-10-CM | POA: Diagnosis not present

## 2019-02-03 DIAGNOSIS — L821 Other seborrheic keratosis: Secondary | ICD-10-CM | POA: Diagnosis not present

## 2019-03-01 DIAGNOSIS — J069 Acute upper respiratory infection, unspecified: Secondary | ICD-10-CM | POA: Diagnosis not present

## 2019-03-10 DIAGNOSIS — I2510 Atherosclerotic heart disease of native coronary artery without angina pectoris: Secondary | ICD-10-CM | POA: Diagnosis not present

## 2019-03-10 DIAGNOSIS — I10 Essential (primary) hypertension: Secondary | ICD-10-CM | POA: Diagnosis not present

## 2019-03-10 DIAGNOSIS — I5022 Chronic systolic (congestive) heart failure: Secondary | ICD-10-CM | POA: Diagnosis not present

## 2019-03-11 DIAGNOSIS — I25810 Atherosclerosis of coronary artery bypass graft(s) without angina pectoris: Secondary | ICD-10-CM | POA: Diagnosis not present

## 2019-03-11 DIAGNOSIS — I255 Ischemic cardiomyopathy: Secondary | ICD-10-CM | POA: Diagnosis not present

## 2019-03-11 DIAGNOSIS — Z85118 Personal history of other malignant neoplasm of bronchus and lung: Secondary | ICD-10-CM | POA: Diagnosis not present

## 2019-03-11 DIAGNOSIS — I480 Paroxysmal atrial fibrillation: Secondary | ICD-10-CM | POA: Diagnosis not present

## 2019-03-11 DIAGNOSIS — I10 Essential (primary) hypertension: Secondary | ICD-10-CM | POA: Diagnosis not present

## 2019-03-11 DIAGNOSIS — N400 Enlarged prostate without lower urinary tract symptoms: Secondary | ICD-10-CM | POA: Diagnosis not present

## 2019-03-19 DIAGNOSIS — M25512 Pain in left shoulder: Secondary | ICD-10-CM | POA: Diagnosis not present

## 2019-03-29 DIAGNOSIS — I255 Ischemic cardiomyopathy: Secondary | ICD-10-CM | POA: Diagnosis not present

## 2019-03-29 DIAGNOSIS — I25810 Atherosclerosis of coronary artery bypass graft(s) without angina pectoris: Secondary | ICD-10-CM | POA: Diagnosis not present

## 2019-03-29 DIAGNOSIS — I480 Paroxysmal atrial fibrillation: Secondary | ICD-10-CM | POA: Diagnosis not present

## 2019-03-29 DIAGNOSIS — I10 Essential (primary) hypertension: Secondary | ICD-10-CM | POA: Diagnosis not present

## 2019-04-13 ENCOUNTER — Telehealth: Payer: Self-pay | Admitting: Oncology

## 2019-04-13 NOTE — Telephone Encounter (Signed)
Scheduled f/u in 2 mths per sch msg. Mailed printout.

## 2019-04-15 ENCOUNTER — Ambulatory Visit: Payer: PPO | Admitting: Oncology

## 2019-05-13 DIAGNOSIS — M12811 Other specific arthropathies, not elsewhere classified, right shoulder: Secondary | ICD-10-CM | POA: Diagnosis present

## 2019-05-25 DIAGNOSIS — Z1389 Encounter for screening for other disorder: Secondary | ICD-10-CM | POA: Diagnosis not present

## 2019-05-25 DIAGNOSIS — Z0000 Encounter for general adult medical examination without abnormal findings: Secondary | ICD-10-CM | POA: Diagnosis not present

## 2019-05-26 ENCOUNTER — Encounter (HOSPITAL_COMMUNITY): Payer: PPO

## 2019-06-01 DIAGNOSIS — I25810 Atherosclerosis of coronary artery bypass graft(s) without angina pectoris: Secondary | ICD-10-CM | POA: Diagnosis not present

## 2019-06-01 DIAGNOSIS — C44329 Squamous cell carcinoma of skin of other parts of face: Secondary | ICD-10-CM | POA: Diagnosis not present

## 2019-06-01 DIAGNOSIS — R05 Cough: Secondary | ICD-10-CM | POA: Diagnosis not present

## 2019-06-01 DIAGNOSIS — E7800 Pure hypercholesterolemia, unspecified: Secondary | ICD-10-CM | POA: Diagnosis not present

## 2019-06-01 DIAGNOSIS — Z789 Other specified health status: Secondary | ICD-10-CM | POA: Diagnosis not present

## 2019-06-01 DIAGNOSIS — I10 Essential (primary) hypertension: Secondary | ICD-10-CM | POA: Diagnosis not present

## 2019-06-01 DIAGNOSIS — I255 Ischemic cardiomyopathy: Secondary | ICD-10-CM | POA: Diagnosis not present

## 2019-06-01 DIAGNOSIS — D485 Neoplasm of uncertain behavior of skin: Secondary | ICD-10-CM | POA: Diagnosis not present

## 2019-06-01 DIAGNOSIS — R7301 Impaired fasting glucose: Secondary | ICD-10-CM | POA: Diagnosis not present

## 2019-06-01 DIAGNOSIS — D0439 Carcinoma in situ of skin of other parts of face: Secondary | ICD-10-CM | POA: Diagnosis not present

## 2019-06-01 DIAGNOSIS — Z85828 Personal history of other malignant neoplasm of skin: Secondary | ICD-10-CM | POA: Diagnosis not present

## 2019-06-02 ENCOUNTER — Inpatient Hospital Stay (HOSPITAL_COMMUNITY): Admission: RE | Admit: 2019-06-02 | Payer: PPO | Source: Other Acute Inpatient Hospital | Admitting: Orthopaedic Surgery

## 2019-06-02 ENCOUNTER — Encounter (HOSPITAL_COMMUNITY): Admission: RE | Payer: Self-pay | Source: Other Acute Inpatient Hospital

## 2019-06-02 DIAGNOSIS — I255 Ischemic cardiomyopathy: Secondary | ICD-10-CM | POA: Diagnosis not present

## 2019-06-02 DIAGNOSIS — I10 Essential (primary) hypertension: Secondary | ICD-10-CM | POA: Diagnosis not present

## 2019-06-02 DIAGNOSIS — I25810 Atherosclerosis of coronary artery bypass graft(s) without angina pectoris: Secondary | ICD-10-CM | POA: Diagnosis not present

## 2019-06-02 DIAGNOSIS — E7800 Pure hypercholesterolemia, unspecified: Secondary | ICD-10-CM | POA: Diagnosis not present

## 2019-06-02 DIAGNOSIS — I480 Paroxysmal atrial fibrillation: Secondary | ICD-10-CM | POA: Diagnosis not present

## 2019-06-02 DIAGNOSIS — Z85118 Personal history of other malignant neoplasm of bronchus and lung: Secondary | ICD-10-CM | POA: Diagnosis not present

## 2019-06-02 DIAGNOSIS — N400 Enlarged prostate without lower urinary tract symptoms: Secondary | ICD-10-CM | POA: Diagnosis not present

## 2019-06-02 SURGERY — ARTHROPLASTY, SHOULDER, TOTAL, REVERSE
Anesthesia: Choice | Laterality: Left

## 2019-06-04 DIAGNOSIS — D485 Neoplasm of uncertain behavior of skin: Secondary | ICD-10-CM | POA: Diagnosis not present

## 2019-06-04 DIAGNOSIS — Z85828 Personal history of other malignant neoplasm of skin: Secondary | ICD-10-CM | POA: Diagnosis not present

## 2019-06-04 DIAGNOSIS — C44329 Squamous cell carcinoma of skin of other parts of face: Secondary | ICD-10-CM | POA: Diagnosis not present

## 2019-06-04 DIAGNOSIS — D0439 Carcinoma in situ of skin of other parts of face: Secondary | ICD-10-CM | POA: Diagnosis not present

## 2019-06-09 ENCOUNTER — Telehealth: Payer: Self-pay | Admitting: Oncology

## 2019-06-09 DIAGNOSIS — Z85828 Personal history of other malignant neoplasm of skin: Secondary | ICD-10-CM | POA: Diagnosis not present

## 2019-06-09 DIAGNOSIS — C44329 Squamous cell carcinoma of skin of other parts of face: Secondary | ICD-10-CM | POA: Diagnosis not present

## 2019-06-09 NOTE — Telephone Encounter (Signed)
R/s appt on 6/16 per pt request.

## 2019-06-15 ENCOUNTER — Ambulatory Visit: Payer: PPO | Admitting: Oncology

## 2019-06-25 ENCOUNTER — Telehealth: Payer: Self-pay | Admitting: Oncology

## 2019-06-25 ENCOUNTER — Other Ambulatory Visit: Payer: Self-pay

## 2019-06-25 ENCOUNTER — Inpatient Hospital Stay: Payer: PPO | Attending: Oncology | Admitting: Oncology

## 2019-06-25 VITALS — BP 129/66 | HR 61 | Temp 98.9°F | Resp 18 | Ht 64.0 in | Wt 169.3 lb

## 2019-06-25 DIAGNOSIS — I2510 Atherosclerotic heart disease of native coronary artery without angina pectoris: Secondary | ICD-10-CM | POA: Diagnosis not present

## 2019-06-25 DIAGNOSIS — Z85828 Personal history of other malignant neoplasm of skin: Secondary | ICD-10-CM

## 2019-06-25 DIAGNOSIS — Z85118 Personal history of other malignant neoplasm of bronchus and lung: Secondary | ICD-10-CM | POA: Diagnosis not present

## 2019-06-25 DIAGNOSIS — C3412 Malignant neoplasm of upper lobe, left bronchus or lung: Secondary | ICD-10-CM

## 2019-06-25 DIAGNOSIS — Z951 Presence of aortocoronary bypass graft: Secondary | ICD-10-CM

## 2019-06-25 NOTE — Progress Notes (Signed)
  [facility] OFFICE PROGRESS NOTE   Diagnosis: Lung cancer  INTERVAL HISTORY:   [name] [name] is as scheduled.  He feels well.  Good appetite.  He recently had Mohs surgery for another squamous cell at the lateral right face.  He continues follow-up with Dr. Ronnald Ramp. A chest CT on 01/28/2019 revealed no evidence of recurrent lung cancer.  Objective:  Vital signs in last 24 hours:  Blood pressure 129/66, pulse 61, temperature 98.9 F (37.2 C), temperature source Oral, resp. rate 18, height 5\' 4"  (1.626 m), weight 169 lb 4.8 oz (76.8 kg), SpO2 99 %. Limited physical examination secondary to distancing with the [place] pandemic  HEENT: Neck without mass Lymphatics: No cervical, supraclavicular, axillary, or inguinal nodes GI: No hepatosplenomegaly Vascular: No leg edema  Skin: Healing surgical site at the lateral right face, right cheek scar without evidence of recurrent tumor    Medications: I have reviewed the patient's current medications.   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 Local recurrence of squamous cell carcinoma and a right face lymph node in 2006, status post surgical excision followed by radiation  3. Left hilar fullness on a chest x-ray 02/26/2012-stable compared to a chest x-ray from February of 2012 and new compared to a chest x-ray 2007 . A chest CT on 05/05/2012 confirmed a left upper lung nodule consistent with a primary bronchogenic carcinoma. A PET scan on 05/18/12 confirmed a hypermetabolic left upper lung nodule with no evidence of thoracic nodal or extrathoracic hypermetabolic disease  -He is status post a left upper lobectomy on 06/16/2012 with the pathology confirming a 3.8 cm squamous cell carcinoma (T2a, N0)  -Restaging CT 06/16/2014 without evidence of recurrent disease  -Restaging CT 12/15/2014 without evidence of recurrent disease -Restaging CT 12/06/2015-no evidence of  recurrent disease -Restaging chest CT 01/02/2017-no evidence of recurrent disease -Restaging chest CT 01/28/2018-no evidence of recurrent disease -Restaging chest CT 01/28/2019-no evidence of recurrent disease 4. History of BPH , status post a laser vaporization of the prostate in February 2014 and TUR August 2015  5. History of multiple skin cancers including basal cell carcinoma and squamous cell carcinomas  6. coronary artery disease, decreased left ventricular ejection , Status post coronary artery bypass surgery July 2017    Disposition: [name] appears well.  He is in clinical remission from lung cancer.  He will see Dr. Servando Snare with a repeat chest CT in January.  He would like to continue follow-up at the cancer center.  He will return for an office visit in 1 year.  Betsy Coder, MD  06/25/2019  9:28 AM

## 2019-06-25 NOTE — Telephone Encounter (Signed)
Gave avs and calendar ° °

## 2019-07-14 DIAGNOSIS — N3942 Incontinence without sensory awareness: Secondary | ICD-10-CM | POA: Diagnosis not present

## 2019-07-14 DIAGNOSIS — N3020 Other chronic cystitis without hematuria: Secondary | ICD-10-CM | POA: Diagnosis not present

## 2019-07-28 DIAGNOSIS — I255 Ischemic cardiomyopathy: Secondary | ICD-10-CM | POA: Diagnosis not present

## 2019-07-28 DIAGNOSIS — I10 Essential (primary) hypertension: Secondary | ICD-10-CM | POA: Diagnosis not present

## 2019-07-28 DIAGNOSIS — Z85118 Personal history of other malignant neoplasm of bronchus and lung: Secondary | ICD-10-CM | POA: Diagnosis not present

## 2019-07-28 DIAGNOSIS — I480 Paroxysmal atrial fibrillation: Secondary | ICD-10-CM | POA: Diagnosis not present

## 2019-07-28 DIAGNOSIS — E7800 Pure hypercholesterolemia, unspecified: Secondary | ICD-10-CM | POA: Diagnosis not present

## 2019-07-28 DIAGNOSIS — N400 Enlarged prostate without lower urinary tract symptoms: Secondary | ICD-10-CM | POA: Diagnosis not present

## 2019-07-28 DIAGNOSIS — I25810 Atherosclerosis of coronary artery bypass graft(s) without angina pectoris: Secondary | ICD-10-CM | POA: Diagnosis not present

## 2019-08-04 DIAGNOSIS — D485 Neoplasm of uncertain behavior of skin: Secondary | ICD-10-CM | POA: Diagnosis not present

## 2019-08-04 DIAGNOSIS — L820 Inflamed seborrheic keratosis: Secondary | ICD-10-CM | POA: Diagnosis not present

## 2019-08-04 DIAGNOSIS — D0439 Carcinoma in situ of skin of other parts of face: Secondary | ICD-10-CM | POA: Diagnosis not present

## 2019-08-04 DIAGNOSIS — L570 Actinic keratosis: Secondary | ICD-10-CM | POA: Diagnosis not present

## 2019-08-04 DIAGNOSIS — D045 Carcinoma in situ of skin of trunk: Secondary | ICD-10-CM | POA: Diagnosis not present

## 2019-08-04 DIAGNOSIS — Z85828 Personal history of other malignant neoplasm of skin: Secondary | ICD-10-CM | POA: Diagnosis not present

## 2019-08-04 DIAGNOSIS — L821 Other seborrheic keratosis: Secondary | ICD-10-CM | POA: Diagnosis not present

## 2019-08-06 DIAGNOSIS — R8290 Unspecified abnormal findings in urine: Secondary | ICD-10-CM | POA: Diagnosis not present

## 2019-08-18 DIAGNOSIS — I10 Essential (primary) hypertension: Secondary | ICD-10-CM | POA: Diagnosis not present

## 2019-08-18 DIAGNOSIS — I2510 Atherosclerotic heart disease of native coronary artery without angina pectoris: Secondary | ICD-10-CM | POA: Diagnosis not present

## 2019-08-18 DIAGNOSIS — I5022 Chronic systolic (congestive) heart failure: Secondary | ICD-10-CM | POA: Diagnosis not present

## 2019-08-21 ENCOUNTER — Encounter (HOSPITAL_COMMUNITY): Payer: Self-pay | Admitting: *Deleted

## 2019-08-21 ENCOUNTER — Other Ambulatory Visit: Payer: Self-pay

## 2019-08-21 ENCOUNTER — Emergency Department (HOSPITAL_COMMUNITY): Payer: PPO

## 2019-08-21 ENCOUNTER — Emergency Department (HOSPITAL_COMMUNITY)
Admission: EM | Admit: 2019-08-21 | Discharge: 2019-08-21 | Disposition: A | Discharge status: Home / Self Care | Payer: PPO | Attending: Emergency Medicine | Admitting: Emergency Medicine

## 2019-08-21 DIAGNOSIS — I2510 Atherosclerotic heart disease of native coronary artery without angina pectoris: Secondary | ICD-10-CM | POA: Diagnosis not present

## 2019-08-21 DIAGNOSIS — I130 Hypertensive heart and chronic kidney disease with heart failure and stage 1 through stage 4 chronic kidney disease, or unspecified chronic kidney disease: Secondary | ICD-10-CM | POA: Diagnosis not present

## 2019-08-21 DIAGNOSIS — Z7982 Long term (current) use of aspirin: Secondary | ICD-10-CM | POA: Diagnosis not present

## 2019-08-21 DIAGNOSIS — N189 Chronic kidney disease, unspecified: Secondary | ICD-10-CM | POA: Diagnosis not present

## 2019-08-21 DIAGNOSIS — E860 Dehydration: Secondary | ICD-10-CM

## 2019-08-21 DIAGNOSIS — Z85828 Personal history of other malignant neoplasm of skin: Secondary | ICD-10-CM | POA: Diagnosis not present

## 2019-08-21 DIAGNOSIS — R309 Painful micturition, unspecified: Secondary | ICD-10-CM | POA: Diagnosis not present

## 2019-08-21 DIAGNOSIS — Z79899 Other long term (current) drug therapy: Secondary | ICD-10-CM | POA: Insufficient documentation

## 2019-08-21 DIAGNOSIS — Z87891 Personal history of nicotine dependence: Secondary | ICD-10-CM | POA: Diagnosis not present

## 2019-08-21 DIAGNOSIS — Z951 Presence of aortocoronary bypass graft: Secondary | ICD-10-CM | POA: Diagnosis not present

## 2019-08-21 DIAGNOSIS — R39198 Other difficulties with micturition: Secondary | ICD-10-CM | POA: Diagnosis not present

## 2019-08-21 DIAGNOSIS — R410 Disorientation, unspecified: Secondary | ICD-10-CM | POA: Insufficient documentation

## 2019-08-21 DIAGNOSIS — R404 Transient alteration of awareness: Secondary | ICD-10-CM | POA: Diagnosis not present

## 2019-08-21 DIAGNOSIS — Z85118 Personal history of other malignant neoplasm of bronchus and lung: Secondary | ICD-10-CM | POA: Diagnosis not present

## 2019-08-21 DIAGNOSIS — I5022 Chronic systolic (congestive) heart failure: Secondary | ICD-10-CM | POA: Diagnosis not present

## 2019-08-21 DIAGNOSIS — R4182 Altered mental status, unspecified: Secondary | ICD-10-CM | POA: Diagnosis not present

## 2019-08-21 LAB — CBC WITH DIFFERENTIAL/PLATELET
Abs Immature Granulocytes: 0.05 K/uL (ref 0.00–0.07)
Basophils Absolute: 0 K/uL (ref 0.0–0.1)
Basophils Relative: 0 %
Eosinophils Absolute: 0.2 K/uL (ref 0.0–0.5)
Eosinophils Relative: 3 %
HCT: 38.3 % — ABNORMAL LOW (ref 39.0–52.0)
Hemoglobin: 12.4 g/dL — ABNORMAL LOW (ref 13.0–17.0)
Immature Granulocytes: 1 %
Lymphocytes Relative: 15 %
Lymphs Abs: 1.1 K/uL (ref 0.7–4.0)
MCH: 31.3 pg (ref 26.0–34.0)
MCHC: 32.4 g/dL (ref 30.0–36.0)
MCV: 96.7 fL (ref 80.0–100.0)
Monocytes Absolute: 0.8 K/uL (ref 0.1–1.0)
Monocytes Relative: 12 %
Neutro Abs: 4.8 K/uL (ref 1.7–7.7)
Neutrophils Relative %: 69 %
Platelets: 172 K/uL (ref 150–400)
RBC: 3.96 MIL/uL — ABNORMAL LOW (ref 4.22–5.81)
RDW: 12.4 % (ref 11.5–15.5)
WBC: 6.9 K/uL (ref 4.0–10.5)
nRBC: 0 % (ref 0.0–0.2)

## 2019-08-21 LAB — COMPREHENSIVE METABOLIC PANEL WITH GFR
ALT: 19 U/L (ref 0–44)
AST: 18 U/L (ref 15–41)
Albumin: 3.5 g/dL (ref 3.5–5.0)
Alkaline Phosphatase: 65 U/L (ref 38–126)
Anion gap: 11 (ref 5–15)
BUN: 31 mg/dL — ABNORMAL HIGH (ref 8–23)
CO2: 22 mmol/L (ref 22–32)
Calcium: 8.8 mg/dL — ABNORMAL LOW (ref 8.9–10.3)
Chloride: 106 mmol/L (ref 98–111)
Creatinine, Ser: 1.46 mg/dL — ABNORMAL HIGH (ref 0.61–1.24)
GFR calc Af Amer: 52 mL/min — ABNORMAL LOW
GFR calc non Af Amer: 44 mL/min — ABNORMAL LOW
Glucose, Bld: 93 mg/dL (ref 70–99)
Potassium: 4.1 mmol/L (ref 3.5–5.1)
Sodium: 139 mmol/L (ref 135–145)
Total Bilirubin: 0.7 mg/dL (ref 0.3–1.2)
Total Protein: 6.5 g/dL (ref 6.5–8.1)

## 2019-08-21 LAB — URINALYSIS, ROUTINE W REFLEX MICROSCOPIC
Bilirubin Urine: NEGATIVE
Glucose, UA: NEGATIVE mg/dL
Hgb urine dipstick: NEGATIVE
Ketones, ur: NEGATIVE mg/dL
Leukocytes,Ua: NEGATIVE
Nitrite: NEGATIVE
Protein, ur: NEGATIVE mg/dL
Specific Gravity, Urine: 1.008 (ref 1.005–1.030)
pH: 6 (ref 5.0–8.0)

## 2019-08-21 LAB — AMMONIA: Ammonia: 19 umol/L (ref 9–35)

## 2019-08-21 LAB — LACTIC ACID, PLASMA: Lactic Acid, Venous: 1.3 mmol/L (ref 0.5–1.9)

## 2019-08-21 MED ORDER — SODIUM CHLORIDE 0.9 % IV SOLN
INTRAVENOUS | Status: DC
  Administered 2019-08-21: 22:00:00 via INTRAVENOUS

## 2019-08-21 MED ORDER — SODIUM CHLORIDE 0.9 % IV BOLUS
500.0000 mL | Freq: Once | INTRAVENOUS | Status: AC
  Administered 2019-08-21: 500 mL via INTRAVENOUS

## 2019-08-21 NOTE — ED Provider Notes (Addendum)
[facility] EMERGENCY DEPARTMENT Provider Note   CSN: [id number] Arrival date & time: 08/21/19  2029     History   Chief Complaint Chief Complaint  Patient presents with  . Urinary Frequency    HPI [name] [name] is a 83 y.o. male.     HPI  This patient is a pleasant 83 year old male who presents with a complaint of confusion from home where he lives with family members.  The patient had reportedly recently been on ciprofloxacin for the last couple of weeks for a urinary tract infection.  These records are not contained within our medical record after my review.  Our notes do indicate that the patient has been treated for coronary disease in fact has had bypass grafting in 2017, he had a left upper lobectomy that was performed in 2013 for a squamous cell carcinoma of the lung, his last CT scan was done in January 2020 which showed no evidence of recurrent disease.  The patient reportedly has had multiple comments of feeling confused today and not understanding what was going on but nothing more focal than that.  Paramedics report they were called to the home and found that the patient was in good spirits without any acute distress, vital signs were unremarkable and he was afebrile.  The family reported that he has been having some ongoing burning with urination as well as some urinary incontinence and difficulty with urination despite the use of ciprofloxacin.  The patient denies any complaints whatsoever to me.  Level 5 caveat applies secondary to confusion  Past Medical History:  Diagnosis Date  . Arthritis    JOINT PAIN RIGHT HAND  . BPH (benign prostatic hyperplasia)    [name]/elevated PSA, prostate biopsy x4 including one saturation biopsy, laser treatment 2/14; NOCTURIA  . CAD (coronary artery disease) 2013   cath 05/2012 showing 80-90% stenosis of a trifurcating diagonal #1, 70-80% stenosis of OM3 and 90% stenosis of distal LCx after OM3 - medical  management, [place]  . Cardiomyopathy ([place])    dilated cardiomyopathy EF 30%, MUGA  EF 42% 08/2012  . Carotid artery occlusion    carotid artery bruit  . Chronic kidney disease    kidney stones -small passed.  . Diastolic dysfunction   . Heart murmur    as a child  . History of shingles   . Hyperlipidemia    statin intolerant  . Hypertension   . Lung mass    Stage 1B non-small cell lung CA s/p resection 2013  . PVC (premature ventricular contraction)   . Shingles   . Sigmoid diverticulosis   . Skin cancer    Multiple skin cancers   . Squamous cell carcinoma, face    history of right face with mets to right upper cheek in 2004  and facial lymph node reoccurence post surgery with XRT  . Trigger finger    Bilateral    Patient Active Problem List   Diagnosis Date Noted  . Right rotator cuff tear arthropathy 05/13/2019  . Numbness 04/21/2018  . Bilateral carpal tunnel syndrome 04/21/2018  . Left shoulder pain 03/25/2018  . Arm weakness 03/25/2018  . Cervical radiculopathy 03/25/2018  . Cervical stenosis of spinal canal 03/25/2018  . S/P CABG (coronary artery bypass graft)   . Angina decubitus ([place]) 06/27/2016  . Acute on chronic systolic CHF (congestive heart failure) ([place]) 10/30/2015  . Benign hypertrophy of prostate 08/22/2014  . Diastolic dysfunction   . CAD (coronary artery disease) 10/09/2013  .  Ischemic dilated cardiomyopathy ([place]) 10/09/2013  . Dyslipidemia 10/09/2013  . HTN (hypertension) 10/09/2013  . Asymptomatic PVCs 10/09/2013  . Lung cancer, upper lobe ([place])   . Squamous cell carcinoma, face   . Skin cancer   . BPH (benign prostatic hyperplasia)   . Shingles     Past Surgical History:  Procedure Laterality Date  . APPENDECTOMY    . APPENDECTOMY    . CARDIAC CATHETERIZATION  05/2012   Has blockage- Treated with Medications.  Cardiologist - Dr Radford Pax  . CARDIAC CATHETERIZATION Left 07/10/2016   Procedure: Left Heart Cath and Coronary Angiography;   Surgeon: Corey Skains, MD;  Location: [place] [facility];  Service: Cardiovascular;  Laterality: Left;  . CATARACT EXTRACTION, BILATERAL    . CORONARY ARTERY BYPASS GRAFT N/A 07/19/2016   Procedure: CORONARY ARTERY BYPASS GRAFTING (CABG) x 4 (LIMA to LAD, SVG to DIAGONAL, SVG to CIRCUMFLEX, SVG to PDA) with EVH from [place] and LEFT INTERNAL MAMMARY ARTERY;  Surgeon: Grace Isaac, MD;  Location: [place];  Service: Open Heart Surgery;  Laterality: N/A;  . excision ot metastatic lymph node followed by radiation  2006   .  Marland Kitchen GREEN LIGHT LASER TURP (TRANSURETHRAL RESECTION OF PROSTATE N/A 02/08/2013   Procedure: GREEN LIGHT LASER TURP (TRANSURETHRAL RESECTION OF PROSTATE;  Surgeon: Ailene Rud, MD;  Location: [facility];  Service: Urology;  Laterality: N/A;  . HOLMIUM LASER APPLICATION Left 4/62/7035   Procedure: HOLMIUM LASER APPLICATION;  Surgeon: Ailene Rud, MD;  Location: [facility];  Service: Urology;  Laterality: Left;  . LYMPH NODE DISSECTION  06/16/2012   Procedure: LYMPH NODE DISSECTION;  Surgeon: Grace Isaac, MD;  Location: [place];  Service: Thoracic;;  . MOHS SURGERY  2004  . RIB PLATING Right 07/19/2016   Procedure: Right sternoclavicular joint plating;  Surgeon: Grace Isaac, MD;  Location: [place];  Service: Open Heart Surgery;  Laterality: Right;  . TEE WITHOUT CARDIOVERSION N/A 07/19/2016   Procedure: TRANSESOPHAGEAL ECHOCARDIOGRAM (TEE);  Surgeon: Grace Isaac, MD;  Location: [place];  Service: Open Heart Surgery;  Laterality: N/A;  . TRANSURETHRAL RESECTION OF PROSTATE Left 08/22/2014   Procedure: TRANSURETHRAL RESECTION OF THE PROSTATE (TURP) CYSTOSCOPY, LEFT RETROGRADE PYLEGRAM,LEFT with STENT,URETEROSCOPY,PLACEMENT OF BACKSTOP,LASER FRAGMENTATION WITH BASKET EXTRACTION LEFT URETEROSCOPY;  Surgeon: Ailene Rud, MD;  Location: [facility];  Service: Urology;  Laterality: Left;  Marland Kitchen VIDEO BRONCHOSCOPY  06/16/2012    Procedure: VIDEO BRONCHOSCOPY;  Surgeon: Grace Isaac, MD;  Location: [place];  Service: Thoracic;  Laterality: N/A;  . VIDEO BRONCHOSCOPY  06/24/2012   Procedure: VIDEO BRONCHOSCOPY;  Surgeon: Grace Isaac, MD;  Location: [facility];  Service: Thoracic;  Laterality: N/A;        Home Medications    Prior to Admission medications   Medication Sig Start Date End Date Taking? Authorizing Provider  Ascorbic Acid (VITAMIN C) 1000 MG tablet Take 1,000 mg by mouth daily.    [provider]  aspirin 81 MG tablet Take 81 mg by mouth daily.    [provider]  b complex vitamins tablet Take 1 tablet by mouth daily.    Daily, Celine, MD  carvedilol (COREG) 6.25 MG tablet TAKE 1 TABLET (6.25 MG TOTAL) BY MOUTH 2 (TWO) TIMES DAILY. NEEDS OFFICE VISIT [phone] 09/14/18   Bensimhon, Shaune Pascal, MD  ezetimibe (ZETIA) 10 MG tablet Take 10 mg by mouth daily.    [provider]  Flaxseed,  Linseed, (FLAXSEED OIL PO) Take by mouth.    [provider]  GARLIC PO Take by mouth.    [provider]  losartan (COZAAR) 25 MG tablet TAKE 1 TABLET (25 MG TOTAL) BY MOUTH AT BEDTIME. 10/28/17 06/25/19  Larey Dresser, MD  Multiple Vitamin (MULTIVITAMIN WITH MINERALS) TABS tablet Take 1 tablet by mouth daily.    [provider]  pyridoxine (B-6) 100 MG tablet Take 100 mg by mouth daily.    [provider]  Red Yeast Rice Extract 600 MG CAPS Take 1 capsule by mouth 2 (two) times daily.    [provider]  spironolactone (ALDACTONE) 25 MG tablet Take 1 tablet (25 mg total) by mouth daily. Call patient for office visit [phone] 05/26/18   Larey Dresser, MD    Family History Family History  Problem Relation Age of Onset  . Diabetes Father   . Psoriasis Child   . Heart disease Child        resuscitated from cardiac arrest from Brugada's syndrome  . Lung cancer Mother   . Heart disease Mother     Social History Social History    Tobacco Use  . Smoking status: Former Smoker    Types: Cigarettes    Quit date: 12/30/1965    Years since quitting: 53.6  . Smokeless tobacco: Never Used  Substance Use Topics  . Alcohol use: Yes    Alcohol/week: 5.0 - 6.0 standard drinks    Types: 3 - 4 Glasses of wine, 2 Cans of beer per week    Comment: infrequently- "indulge heavly when I do"  . Drug use: No     Allergies   Ramipril and Statins   Review of Systems Review of Systems  Unable to perform ROS: Mental status change     Physical Exam Updated Vital Signs BP (!) 107/58   Pulse 81   Temp (!) 97.5 F (36.4 C) (Rectal)   Resp 18   SpO2 95%   Physical Exam Vitals signs and nursing note reviewed.  Constitutional:      General: He is not in acute distress.    Appearance: He is well-developed.  HENT:     Head: Normocephalic and atraumatic.     Mouth/Throat:     Pharynx: No oropharyngeal exudate.  Eyes:     General: No scleral icterus.       Right eye: No discharge.        Left eye: No discharge.     Conjunctiva/sclera: Conjunctivae normal.     Pupils: Pupils are equal, round, and reactive to light.  Neck:     Musculoskeletal: Normal range of motion and neck supple.     Thyroid: No thyromegaly.     Vascular: No JVD.  Cardiovascular:     Rate and Rhythm: Normal rate and regular rhythm.     Heart sounds: Normal heart sounds. No murmur. No friction rub. No gallop.   Pulmonary:     Effort: Pulmonary effort is normal. No respiratory distress.     Breath sounds: Normal breath sounds. No wheezing or rales.  Abdominal:     General: Bowel sounds are normal. There is no distension.     Palpations: Abdomen is soft. There is no mass.     Tenderness: There is no abdominal tenderness.  Musculoskeletal: Normal range of motion.        General: No tenderness.  Lymphadenopathy:     Cervical: No cervical adenopathy.  Skin:  General: Skin is warm and dry.     Findings: No erythema or rash.  Neurological:      Mental Status: He is alert.     Coordination: Coordination normal.     Comments: Mildly confused but able to answer all my questions, perform all of my commands, good strength in all 4 extremities.  Able to sit up in bed by himself and has no facial droop.  Psychiatric:        Behavior: Behavior normal.     ED Treatments / Results  Labs (all labs ordered are listed, but only abnormal results are displayed) Labs Reviewed  COMPREHENSIVE METABOLIC PANEL - Abnormal; Notable for the following components:      Result Value   BUN 31 (*)    Creatinine, Ser 1.46 (*)    Calcium 8.8 (*)    GFR calc non Af Amer 44 (*)    GFR calc Af Amer 52 (*)    All other components within normal limits  CBC WITH DIFFERENTIAL/PLATELET - Abnormal; Notable for the following components:   RBC 3.96 (*)    Hemoglobin 12.4 (*)    HCT 38.3 (*)    All other components within normal limits  URINE CULTURE  URINALYSIS, ROUTINE W REFLEX MICROSCOPIC  LACTIC ACID, PLASMA  AMMONIA    EKG EKG performed August 21, 2019 at 8:34 PM shows normal sinus rhythm with slight leftward axis, left ventricular hypertrophy is present, there is poor R wave progression and a PVC but otherwise unremarkable EKG.  Radiology Ct Head Wo Contrast  Result Date: 08/21/2019 CLINICAL DATA:  Altered level of consciousness (LOC), unexplained EXAM: CT HEAD WITHOUT CONTRAST TECHNIQUE: Contiguous axial images were obtained from the base of the skull through the vertex without intravenous contrast. COMPARISON:  Head CT 04/01/2013 FINDINGS: Brain: Brain volume is normal for age. No intracranial hemorrhage, mass effect, or midline shift. No hydrocephalus. The basilar cisterns are patent. No evidence of territorial infarct or acute ischemia. No extra-axial or intracranial fluid collection. Vascular: Atherosclerosis of skullbase vasculature without hyperdense vessel or abnormal calcification. Skull: No fracture or focal lesion. Sinuses/Orbits: Paranasal  sinuses and mastoid air cells are clear. The visualized orbits are unremarkable. Bilateral cataract resection. Other: Midline frontal scalp lipoma again seen. IMPRESSION: No acute intracranial abnormality. Electronically Signed   By: Keith Rake M.D.   On: 08/21/2019 23:29    Procedures Procedures (including critical care time)  Medications Ordered in ED Medications  0.9 %  sodium chloride infusion ( Intravenous New Bag/Given 08/21/19 2140)  sodium chloride 0.9 % bolus 500 mL (0 mLs Intravenous Stopping Infusion hung by another clincian 08/21/19 2324)     Initial Impression / Assessment and Plan / ED Course  I have reviewed the triage vital signs and the nursing notes.  Pertinent labs & imaging results that were available during my care of the patient were reviewed by me and considered in my medical decision making (see chart for details).        This patient appears mildly confused, he is able to perform all of the functions that I asked him.  At this time the patient will need to have a repeat urinalysis, labs, ammonia level though he appears comfortable and has normal vital signs.  I would consider multiple different etiologies but the actual side effects of ciprofloxacin can certainly cause all of this as well.  Urine and urine culture pending, no CT scan indicated at this time as this is not a focal  neurologic lesion  UA negative, Labs show mild AKI - fluids given, and normal LFT's, CBC without acute findings. VS showed some transient hypotention but pt is not tachycardic, nor does he have a fever  I believe that he likely has a change in mental status related to the recent cipro which has known neuropsychiatric effects. May last for weeks to months   CT negative, Fluids given for mild AKI  Final Clinical Impressions(s) / ED Diagnoses   Final diagnoses:  Confusion  Dehydration     Noemi Chapel, MD 08/21/19 2330    Noemi Chapel, MD 08/21/19 5035    Noemi Chapel, MD 08/21/19 [phone]

## 2019-08-21 NOTE — ED Notes (Signed)
Patient transported to CT 

## 2019-08-21 NOTE — ED Triage Notes (Signed)
Pt arrives via [facility] from home with c/o foul smelling urine, slight confusion, frequency, lethargy, incontinence. Recently dx with UTI, on cipro (completed dose yesterday). cbg 165, P74, 126/60, 96% RA. IV established, 100 NS en route. Hx of frequent UTI, CAD,CABG.

## 2019-08-21 NOTE — Discharge Instructions (Signed)
Your testing today has shown that you are slightly dehydrated and we have given you IV fluids.  The confusion that you have may be related to the recent antibiotic that you took for your urinary infection which is known to cause some confusion which can last weeks or even months.  The CT scan of your brain was normal, her vital signs have been reassuring including her blood pressure, your heart rate.  See your doctor in 48 hours for recheck or return to the emergency department for severe or worsening symptoms.  You will need to have your family doctor recheck your kidney tests in 1 week

## 2019-08-21 NOTE — ED Notes (Signed)
Discharge instructions discussed with pt. And wife at bedside. Pt and wife verbalized understanding. No questions at this time

## 2019-08-23 DIAGNOSIS — R799 Abnormal finding of blood chemistry, unspecified: Secondary | ICD-10-CM | POA: Diagnosis not present

## 2019-08-23 DIAGNOSIS — R410 Disorientation, unspecified: Secondary | ICD-10-CM | POA: Diagnosis not present

## 2019-08-23 LAB — URINE CULTURE: Culture: <10000 — AB

## 2019-08-27 DIAGNOSIS — I255 Ischemic cardiomyopathy: Secondary | ICD-10-CM | POA: Diagnosis not present

## 2019-08-27 DIAGNOSIS — I10 Essential (primary) hypertension: Secondary | ICD-10-CM | POA: Diagnosis not present

## 2019-08-27 DIAGNOSIS — I480 Paroxysmal atrial fibrillation: Secondary | ICD-10-CM | POA: Diagnosis not present

## 2019-08-27 DIAGNOSIS — E7800 Pure hypercholesterolemia, unspecified: Secondary | ICD-10-CM | POA: Diagnosis not present

## 2019-08-27 DIAGNOSIS — N400 Enlarged prostate without lower urinary tract symptoms: Secondary | ICD-10-CM | POA: Diagnosis not present

## 2019-08-27 DIAGNOSIS — I25810 Atherosclerosis of coronary artery bypass graft(s) without angina pectoris: Secondary | ICD-10-CM | POA: Diagnosis not present

## 2019-08-27 DIAGNOSIS — Z85118 Personal history of other malignant neoplasm of bronchus and lung: Secondary | ICD-10-CM | POA: Diagnosis not present

## 2019-09-01 DIAGNOSIS — I255 Ischemic cardiomyopathy: Secondary | ICD-10-CM | POA: Diagnosis not present

## 2019-09-01 DIAGNOSIS — E7800 Pure hypercholesterolemia, unspecified: Secondary | ICD-10-CM | POA: Diagnosis not present

## 2019-09-01 DIAGNOSIS — I10 Essential (primary) hypertension: Secondary | ICD-10-CM | POA: Diagnosis not present

## 2019-09-01 DIAGNOSIS — Z85118 Personal history of other malignant neoplasm of bronchus and lung: Secondary | ICD-10-CM | POA: Diagnosis not present

## 2019-09-01 DIAGNOSIS — I480 Paroxysmal atrial fibrillation: Secondary | ICD-10-CM | POA: Diagnosis not present

## 2019-09-01 DIAGNOSIS — I25810 Atherosclerosis of coronary artery bypass graft(s) without angina pectoris: Secondary | ICD-10-CM | POA: Diagnosis not present

## 2019-09-01 DIAGNOSIS — N400 Enlarged prostate without lower urinary tract symptoms: Secondary | ICD-10-CM | POA: Diagnosis not present

## 2019-09-15 DIAGNOSIS — I2510 Atherosclerotic heart disease of native coronary artery without angina pectoris: Secondary | ICD-10-CM | POA: Diagnosis not present

## 2019-09-15 DIAGNOSIS — E782 Mixed hyperlipidemia: Secondary | ICD-10-CM | POA: Diagnosis not present

## 2019-09-15 DIAGNOSIS — I5022 Chronic systolic (congestive) heart failure: Secondary | ICD-10-CM | POA: Diagnosis not present

## 2019-09-15 DIAGNOSIS — I10 Essential (primary) hypertension: Secondary | ICD-10-CM | POA: Diagnosis not present

## 2019-11-24 DIAGNOSIS — Z85118 Personal history of other malignant neoplasm of bronchus and lung: Secondary | ICD-10-CM | POA: Diagnosis not present

## 2019-11-24 DIAGNOSIS — E7800 Pure hypercholesterolemia, unspecified: Secondary | ICD-10-CM | POA: Diagnosis not present

## 2019-11-24 DIAGNOSIS — I480 Paroxysmal atrial fibrillation: Secondary | ICD-10-CM | POA: Diagnosis not present

## 2019-11-24 DIAGNOSIS — I25810 Atherosclerosis of coronary artery bypass graft(s) without angina pectoris: Secondary | ICD-10-CM | POA: Diagnosis not present

## 2019-11-24 DIAGNOSIS — I10 Essential (primary) hypertension: Secondary | ICD-10-CM | POA: Diagnosis not present

## 2019-11-24 DIAGNOSIS — N400 Enlarged prostate without lower urinary tract symptoms: Secondary | ICD-10-CM | POA: Diagnosis not present

## 2019-11-24 DIAGNOSIS — I255 Ischemic cardiomyopathy: Secondary | ICD-10-CM | POA: Diagnosis not present

## 2019-12-02 DIAGNOSIS — I255 Ischemic cardiomyopathy: Secondary | ICD-10-CM | POA: Diagnosis not present

## 2019-12-02 DIAGNOSIS — I10 Essential (primary) hypertension: Secondary | ICD-10-CM | POA: Diagnosis not present

## 2019-12-02 DIAGNOSIS — I25810 Atherosclerosis of coronary artery bypass graft(s) without angina pectoris: Secondary | ICD-10-CM | POA: Diagnosis not present

## 2019-12-02 DIAGNOSIS — L750 Bromhidrosis: Secondary | ICD-10-CM | POA: Diagnosis not present

## 2019-12-02 DIAGNOSIS — E7800 Pure hypercholesterolemia, unspecified: Secondary | ICD-10-CM | POA: Diagnosis not present

## 2019-12-02 DIAGNOSIS — Z789 Other specified health status: Secondary | ICD-10-CM | POA: Diagnosis not present

## 2019-12-14 ENCOUNTER — Other Ambulatory Visit: Payer: Self-pay | Admitting: Cardiothoracic Surgery

## 2019-12-14 DIAGNOSIS — R911 Solitary pulmonary nodule: Secondary | ICD-10-CM

## 2019-12-28 DIAGNOSIS — I10 Essential (primary) hypertension: Secondary | ICD-10-CM | POA: Diagnosis not present

## 2019-12-28 DIAGNOSIS — I255 Ischemic cardiomyopathy: Secondary | ICD-10-CM | POA: Diagnosis not present

## 2019-12-28 DIAGNOSIS — Z85118 Personal history of other malignant neoplasm of bronchus and lung: Secondary | ICD-10-CM | POA: Diagnosis not present

## 2019-12-28 DIAGNOSIS — E7800 Pure hypercholesterolemia, unspecified: Secondary | ICD-10-CM | POA: Diagnosis not present

## 2019-12-28 DIAGNOSIS — I25810 Atherosclerosis of coronary artery bypass graft(s) without angina pectoris: Secondary | ICD-10-CM | POA: Diagnosis not present

## 2019-12-28 DIAGNOSIS — I480 Paroxysmal atrial fibrillation: Secondary | ICD-10-CM | POA: Diagnosis not present

## 2019-12-28 DIAGNOSIS — N400 Enlarged prostate without lower urinary tract symptoms: Secondary | ICD-10-CM | POA: Diagnosis not present

## 2019-12-30 DIAGNOSIS — M79641 Pain in right hand: Secondary | ICD-10-CM | POA: Diagnosis not present

## 2019-12-30 DIAGNOSIS — M79642 Pain in left hand: Secondary | ICD-10-CM | POA: Diagnosis not present

## 2019-12-30 DIAGNOSIS — G5601 Carpal tunnel syndrome, right upper limb: Secondary | ICD-10-CM | POA: Diagnosis not present

## 2019-12-30 DIAGNOSIS — G5603 Carpal tunnel syndrome, bilateral upper limbs: Secondary | ICD-10-CM | POA: Diagnosis not present

## 2019-12-30 DIAGNOSIS — G5602 Carpal tunnel syndrome, left upper limb: Secondary | ICD-10-CM | POA: Diagnosis not present

## 2020-01-13 DIAGNOSIS — I2510 Atherosclerotic heart disease of native coronary artery without angina pectoris: Secondary | ICD-10-CM | POA: Diagnosis not present

## 2020-01-13 DIAGNOSIS — I10 Essential (primary) hypertension: Secondary | ICD-10-CM | POA: Diagnosis not present

## 2020-01-13 DIAGNOSIS — E782 Mixed hyperlipidemia: Secondary | ICD-10-CM | POA: Diagnosis not present

## 2020-01-13 DIAGNOSIS — I5022 Chronic systolic (congestive) heart failure: Secondary | ICD-10-CM | POA: Diagnosis not present

## 2020-02-03 ENCOUNTER — Ambulatory Visit: Payer: PPO | Admitting: Cardiothoracic Surgery

## 2020-02-03 ENCOUNTER — Other Ambulatory Visit: Payer: Self-pay

## 2020-02-03 ENCOUNTER — Encounter: Payer: Self-pay | Admitting: Cardiothoracic Surgery

## 2020-02-03 ENCOUNTER — Ambulatory Visit
Admission: RE | Admit: 2020-02-03 | Discharge: 2020-02-03 | Disposition: A | Discharge status: Home / Self Care | Payer: PPO | Source: Ambulatory Visit | Attending: Cardiothoracic Surgery | Admitting: Cardiothoracic Surgery

## 2020-02-03 VITALS — BP 87/54 | HR 68 | Temp 97.7°F | Resp 20 | Ht 64.0 in | Wt 171.0 lb

## 2020-02-03 DIAGNOSIS — Z85118 Personal history of other malignant neoplasm of bronchus and lung: Secondary | ICD-10-CM | POA: Diagnosis not present

## 2020-02-03 DIAGNOSIS — Z902 Acquired absence of lung [part of]: Secondary | ICD-10-CM

## 2020-02-03 DIAGNOSIS — R911 Solitary pulmonary nodule: Secondary | ICD-10-CM

## 2020-02-03 DIAGNOSIS — C3412 Malignant neoplasm of upper lobe, left bronchus or lung: Secondary | ICD-10-CM | POA: Diagnosis not present

## 2020-02-03 NOTE — Progress Notes (Signed)
[address]       [place] [address]             [phone]      [name] [name] [place] Medical Record #[id number] Date of Birth: November 16, 1936  Referring: Corey Skains, MD Primary Care: Lavone Orn, MD  Chief Complaint:   POST OP FOLLOW UP 07/19/2016  OPERATIVE REPORT PREOPERATIVE DIAGNOSIS:  Three-vessel coronary artery disease with ischemic cardiomyopathy, ejection fraction 20%. POSTOPERATIVE DIAGNOSIS:  Three-vessel coronary artery disease with ischemic cardiomyopathy, ejection fraction 20%. Nonunion of the right sternoclavicular joint. PROCEDURE:  Coronary artery bypass grafting x4 with left internal mammary to the left anterior descending coronary artery, reverse saphenous vein graft to the diagonal coronary artery, reverse saphenous vein graft to the circumflex coronary artery, reverse saphenous vein graft to the posterior descending coronary artery with right thigh and calf greater saphenous endoscopic vein harvesting. Plating of right sternoclavicular joint SURGEON:  Lanelle Bal, MD.  06/19/2012  OPERATIVE REPORT  PREOPERATIVE DIAGNOSIS: Left upper lobe lung mass.  POSTOPERATIVE DIAGNOSES: Left upper lobe lung mass, squamous cell  carcinoma, non-small cell carcinoma by frozen section.  PROCEDURE PERFORMED: Video bronchoscopy, left video-assisted  thoracoscopy with mini thoracotomy, resection of left upper lobe and  portion of superior segment left lower lobe and lymph node dissection.   Stage IB, (pT2a,pN0,cM0 SQUAMOUS CELL CARCINOMA, 3.8 CM.)   History of Present Illness:    Patient comes to the office today in follow-up low-dose CT scan of the chest for follow-up on his previous history of left upper lobectomy for stage Ib carcinoma of the lung.,  Resected 2013.  In 2017 he underwent coronary artery bypass grafting for three-vessel coronary artery disease and severe LV dysfunction and dilated cardiomyopathy.  Patient  continues to do well, denies any specific angina.  Does note shortness of breath in cold weather and walking up hills.  He does describe some pin-like sensation in his upper sternal incision, possibly related to painful sternal wire but this does not appear to be a problem currently.    Past Medical History:  Diagnosis Date  . Arthritis    JOINT PAIN RIGHT HAND  . BPH (benign prostatic hyperplasia)    [name]/elevated PSA, prostate biopsy x4 including one saturation biopsy, laser treatment 2/14; NOCTURIA  . CAD (coronary artery disease) 2013   cath 05/2012 showing 80-90% stenosis of a trifurcating diagonal #1, 70-80% stenosis of OM3 and 90% stenosis of distal LCx after OM3 - medical management, [place]  . Cardiomyopathy ([place])    dilated cardiomyopathy EF 30%, MUGA  EF 42% 08/2012  . Carotid artery occlusion    carotid artery bruit  . Chronic kidney disease    kidney stones -small passed.  . Diastolic dysfunction   . Heart murmur    as a child  . History of shingles   . Hyperlipidemia    statin intolerant  . Hypertension   . Lung mass    Stage 1B non-small cell lung CA s/p resection 2013  . PVC (premature ventricular contraction)   . Shingles   . Sigmoid diverticulosis   . Skin cancer    Multiple skin cancers   . Squamous cell carcinoma, face    history of right face with mets to right upper cheek in 2004  and facial lymph node reoccurence post surgery with XRT  . Trigger finger    Bilateral     Social History   Tobacco Use  Smoking Status Former  Smoker  . Types: Cigarettes  . Quit date: 12/30/1965  . Years since quitting: 54.1  Smokeless Tobacco Never Used    Social History   Substance and Sexual Activity  Alcohol Use Yes  . Alcohol/week: 5.0 - 6.0 standard drinks  . Types: 3 - 4 Glasses of wine, 2 Cans of beer per week   Comment: infrequently- "indulge heavly when I do"     Allergies  Allergen Reactions  . Ciprofloxacin Other (See Comments)    Patient took  longer to process questions you ask him   . Ramipril Cough  . Statins     Confusion Muscle pain     Current Outpatient Medications  Medication Sig Dispense Refill  . Ascorbic Acid (VITAMIN C) 1000 MG tablet Take 1,000 mg by mouth daily.    Marland Kitchen aspirin 81 MG tablet Take 81 mg by mouth daily.    . carvedilol (COREG) 6.25 MG tablet TAKE 1 TABLET (6.25 MG TOTAL) BY MOUTH 2 (TWO) TIMES DAILY. NEEDS OFFICE VISIT [phone] 60 tablet 0  . ezetimibe (ZETIA) 10 MG tablet Take 10 mg by mouth daily.    . Flaxseed, Linseed, (FLAXSEED OIL PO) Take by mouth.    Marland Kitchen GARLIC PO Take by mouth.    . Multiple Vitamin (MULTIVITAMIN WITH MINERALS) TABS tablet Take 1 tablet by mouth daily.    . Red Yeast Rice Extract 600 MG CAPS Take 1 capsule by mouth 2 (two) times daily.    Marland Kitchen spironolactone (ALDACTONE) 25 MG tablet Take 1 tablet (25 mg total) by mouth daily. Call patient for office visit [phone] 90 tablet 0  . losartan (COZAAR) 25 MG tablet TAKE 1 TABLET (25 MG TOTAL) BY MOUTH AT BEDTIME. 90 tablet 3   No current facility-administered medications for this visit.    Family History  Problem Relation Age of Onset  . Diabetes Father   . Psoriasis Child   . Heart disease Child        resuscitated from cardiac arrest from Brugada's syndrome  . Lung cancer Mother   . Heart disease Mother    Review of Systems  Constitutional: Negative.   HENT: Negative.   Eyes: Negative.   Respiratory: Positive for shortness of breath. Negative for hemoptysis and wheezing.   Cardiovascular: Negative for chest pain, palpitations, orthopnea and leg swelling.  Gastrointestinal: Negative for heartburn and nausea.  Genitourinary: Negative.   Musculoskeletal: Negative.   Neurological: Negative.   Endo/Heme/Allergies: Negative.   Psychiatric/Behavioral: Negative.     Physical Exam: BP (!) 87/54   Pulse 68   Temp 97.7 F (36.5 C) (Skin)   Resp 20   Ht 5\' 4"  (1.626 m)   Wt 171 lb (77.6 kg)   SpO2 98% Comment: RA   BMI 29.35 kg/m  General appearance: alert, cooperative and no distress Head: Normocephalic, without obvious abnormality, atraumatic Lymph nodes: Cervical, supraclavicular, and axillary nodes normal. Resp: clear to auscultation bilaterally Cardio: regular rate and rhythm, S1, S2 normal, no murmur, click, rub or gallop GI: soft, non-tender; bowel sounds normal; no masses,  no organomegaly Extremities: extremities normal, atraumatic, no cyanosis or edema and Homans sign is negative, no sign of DVT Neurologic: Grossly normal  Diagnostic Studies & Laboratory data:     Recent Radiology Findings: CT Chest Wo Contrast  Result Date: 02/03/2020 CLINICAL DATA:  Status post left upper lobectomy for lung cancer in 2013. Restaging. EXAM: CT CHEST WITHOUT CONTRAST TECHNIQUE: Multidetector CT imaging of the chest was performed following the standard protocol  without IV contrast. COMPARISON:  01/28/2019 chest CT. FINDINGS: Cardiovascular: Stable mild cardiomegaly. No significant pericardial effusion/thickening. Left main and 3 vessel coronary atherosclerosis status post CABG. Atherosclerotic nonaneurysmal thoracic aorta. Stable dilated main pulmonary artery (3.7 cm diameter). Mediastinum/Nodes: Apparent right hemithyroidectomy. Subcentimeter hypodense left thyroid nodules are unchanged and require no follow-up. Unremarkable esophagus. No pathologically enlarged axillary, mediastinal or hilar lymph nodes, noting limited sensitivity for the detection of hilar adenopathy on this noncontrast study. Lungs/Pleura: No pneumothorax. No pleural effusion. Status post left upper lobectomy. No acute consolidative airspace disease, lung masses or significant pulmonary nodules. Upper abdomen: Simple 1.3 cm posterior right liver cyst. Colonic diverticulosis. Musculoskeletal: No aggressive appearing focal osseous lesions. Symmetric mild bilateral gynecomastia, unchanged. New discontinuity in the superior most sternotomy wire.  Otherwise intact sternotomy wires. Moderate thoracic spondylosis. IMPRESSION: 1. No evidence of local tumor recurrence status post left upper lobectomy. 2. No evidence of metastatic disease in the chest. 3. Stable mild cardiomegaly. Stable dilated main pulmonary artery, suggesting chronic pulmonary arterial hypertension. 4. Aortic Atherosclerosis (ICD10-I70.0). Aortic Atherosclerosis (ICD10-I70.0). Electronically Signed   By: Ilona Sorrel M.D.   On: 02/03/2020 10:38    I have independently reviewed the above radiology studies  and reviewed the findings with the patient.     Ct Chest Wo Contrast  Result Date: 01/28/2018 CLINICAL DATA:  84 year old male with history of left upper lobectomy for lung cancer in 2015. Former smoker (quit 55 years ago). EXAM: CT CHEST WITHOUT CONTRAST TECHNIQUE: Multidetector CT imaging of the chest was performed following the standard protocol without IV contrast. COMPARISON:  Chest CT 01/02/2017. FINDINGS: Cardiovascular: Heart size is borderline enlarged. There is no significant pericardial fluid, thickening or pericardial calcification. There is aortic atherosclerosis, as well as atherosclerosis of the great vessels of the mediastinum and the coronary arteries, including calcified atherosclerotic plaque in the left main, left anterior descending, left circumflex and right coronary arteries. Status post median sternotomy for CABG including LIMA to the LAD. Dilatation of the pulmonic trunk (3.6 cm in diameter). Mediastinum/Nodes: No pathologically enlarged mediastinal or hilar lymph nodes. Please note that accurate exclusion of hilar adenopathy is limited on noncontrast CT scans. Esophagus is unremarkable in appearance. No axillary lymphadenopathy. Lungs/Pleura: Status post left upper lobectomy. Compensatory hyperexpansion of the left lower lobe. Chronic elevation of the left hemidiaphragm. Areas of mild postoperative scarring are again noted throughout the left lower lobe. No  suspicious appearing pulmonary nodules or masses are noted. No acute consolidative airspace disease. No pleural effusions. Upper Abdomen: 11 mm low-attenuation lesion in segment 7 of the liver, incompletely characterized on today's noncontrast CT examination, but stable to the prior study, likely a cyst. Aortic atherosclerosis. Colonic diverticulosis in the proximal descending colon. Musculoskeletal: There are no aggressive appearing lytic or blastic lesions noted in the visualized portions of the skeleton. Median sternotomy wires. IMPRESSION: 1. Status post left upper lobectomy. No findings to suggest local recurrence of disease or metastatic disease in the thorax. 2. Aortic atherosclerosis, in addition to left main and 3 vessel coronary artery disease. Status post median sternotomy for CABG including LIMA to the LAD. 3. Additional incidental findings, as above. Aortic Atherosclerosis (ICD10-I70.0). Electronically Signed   By: Vinnie Langton M.D.   On: 01/28/2018 12:06  I have independently reviewed the above radiology studies  and reviewed the findings with the patient.     Ct Chest Wo Contrast  Result Date: 01/02/2017 CLINICAL DATA:  Stage IB squamous cell lung carcinoma of the left upper lobe status  post left upper lobectomy 06/16/2012, presenting for restaging. No current symptoms are reported. Remote smoking history. EXAM: CT CHEST WITHOUT CONTRAST TECHNIQUE: Multidetector CT imaging of the chest was performed following the standard protocol without IV contrast. COMPARISON:  07/18/2016 chest CT.  08/26/2016 chest radiograph. FINDINGS: Cardiovascular: Stable mild cardiomegaly. No significant pericardial fluid/thickening. Left main, left anterior descending, left circumflex and right coronary atherosclerosis status post CABG with ascending aortic and left internal mammary bypass grafts. Atherosclerotic nonaneurysmal thoracic aorta. Stable enlarged main pulmonary artery (3.5 cm diameter).  Mediastinum/Nodes: No discrete thyroid nodules. Unremarkable esophagus. No pathologically enlarged axillary, mediastinal or gross hilar lymph nodes, noting limited sensitivity for the detection of hilar adenopathy on this noncontrast study. Lungs/Pleura: No pneumothorax. No pleural effusion. Stable volume loss in the left hemithorax status post left upper lobectomy. Peripheral basilar right lower lobe 2 mm solid pulmonary nodule (series 4/ image 102) is stable back to 12/15/2014 and considered benign. Stable mild postsurgical scarring in the basilar left lower lobe. No acute consolidative airspace disease, lung masses or new significant pulmonary nodules. Upper abdomen: Simple 1.1 cm posterior upper right liver cyst. Clustered nonobstructing stones in the lower left kidney measuring up to 4 mm. Left colonic diverticulosis. Musculoskeletal: No aggressive appearing focal osseous lesions. Surgical plate with interlocking screws in the anterior right sternoclavicular joint. Sternotomy wires appear aligned and intact. Moderate thoracic spondylosis. Mild gynecomastia, asymmetric to the left, new. IMPRESSION: 1. No evidence of local tumor recurrence status post left upper lobectomy. 2. No evidence of metastatic disease in the chest. 3. New mild gynecomastia, asymmetric to the left. 4. Stable mild cardiomegaly. Stable dilated main pulmonary artery suggesting chronic pulmonary arterial hypertension. 5. Aortic atherosclerosis. Left main and 3 vessel coronary atherosclerosis status post CABG . 6. Nonobstructing lower left nephrolithiasis. Electronically Signed   By: Ilona Sorrel M.D.   On: 01/02/2017 11:49      Recent Lab Findings: Lab Results  Component Value Date   WBC 6.9 08/21/2019   HGB 12.4 (L) 08/21/2019   HCT 38.3 (L) 08/21/2019   PLT 172 08/21/2019   GLUCOSE 93 08/21/2019   CHOL 157 03/17/2017   TRIG 106 03/17/2017   HDL 61 03/17/2017   LDLDIRECT 147.6 11/08/2013   LDLCALC 75 03/17/2017   ALT 19  08/21/2019   AST 18 08/21/2019   NA 139 08/21/2019   K 4.1 08/21/2019   CL 106 08/21/2019   CREATININE 1.46 (H) 08/21/2019   BUN 31 (H) 08/21/2019   CO2 22 08/21/2019   TSH 1.645 07/22/2016   INR 1.50 (H) 07/19/2016   HGBA1C 5.8 (H) 07/17/2016      Assessment / Plan:   Patient remains clinically stable after coronary artery bypass grafting with severe LV dysfunction in 2017 and resection of stage Ib non-small cell carcinoma of the left upper lobe with left upper lobectomy in 2013. -CT scan today shows no evidence of recurrence  Patient desires to come back in 1 year with follow-up low-dose CT of the chest, will plan to see him back at that time.  Should he notice any painful areas along the sternal incision that could be irritation locally from the sternal wire he would be sure to call.  Grace Isaac MD      [address] [place] [address] Office [phone]   Beeper [phone]  02/03/2020 12:08 PM

## 2020-02-09 DIAGNOSIS — Z85828 Personal history of other malignant neoplasm of skin: Secondary | ICD-10-CM | POA: Diagnosis not present

## 2020-02-09 DIAGNOSIS — D485 Neoplasm of uncertain behavior of skin: Secondary | ICD-10-CM | POA: Diagnosis not present

## 2020-02-09 DIAGNOSIS — L821 Other seborrheic keratosis: Secondary | ICD-10-CM | POA: Diagnosis not present

## 2020-02-09 DIAGNOSIS — L309 Dermatitis, unspecified: Secondary | ICD-10-CM | POA: Diagnosis not present

## 2020-02-09 DIAGNOSIS — L570 Actinic keratosis: Secondary | ICD-10-CM | POA: Diagnosis not present

## 2020-02-09 DIAGNOSIS — D0439 Carcinoma in situ of skin of other parts of face: Secondary | ICD-10-CM | POA: Diagnosis not present

## 2020-02-09 DIAGNOSIS — D1801 Hemangioma of skin and subcutaneous tissue: Secondary | ICD-10-CM | POA: Diagnosis not present

## 2020-02-16 DIAGNOSIS — Z85118 Personal history of other malignant neoplasm of bronchus and lung: Secondary | ICD-10-CM | POA: Diagnosis not present

## 2020-02-16 DIAGNOSIS — I480 Paroxysmal atrial fibrillation: Secondary | ICD-10-CM | POA: Diagnosis not present

## 2020-02-16 DIAGNOSIS — E7800 Pure hypercholesterolemia, unspecified: Secondary | ICD-10-CM | POA: Diagnosis not present

## 2020-02-16 DIAGNOSIS — I10 Essential (primary) hypertension: Secondary | ICD-10-CM | POA: Diagnosis not present

## 2020-02-16 DIAGNOSIS — N400 Enlarged prostate without lower urinary tract symptoms: Secondary | ICD-10-CM | POA: Diagnosis not present

## 2020-02-16 DIAGNOSIS — I25810 Atherosclerosis of coronary artery bypass graft(s) without angina pectoris: Secondary | ICD-10-CM | POA: Diagnosis not present

## 2020-02-16 DIAGNOSIS — I255 Ischemic cardiomyopathy: Secondary | ICD-10-CM | POA: Diagnosis not present

## 2020-05-04 DIAGNOSIS — I10 Essential (primary) hypertension: Secondary | ICD-10-CM | POA: Diagnosis not present

## 2020-05-04 DIAGNOSIS — I5022 Chronic systolic (congestive) heart failure: Secondary | ICD-10-CM | POA: Diagnosis not present

## 2020-05-04 DIAGNOSIS — I2510 Atherosclerotic heart disease of native coronary artery without angina pectoris: Secondary | ICD-10-CM | POA: Diagnosis not present

## 2020-05-04 DIAGNOSIS — E782 Mixed hyperlipidemia: Secondary | ICD-10-CM | POA: Diagnosis not present

## 2020-05-10 DIAGNOSIS — Z85118 Personal history of other malignant neoplasm of bronchus and lung: Secondary | ICD-10-CM | POA: Diagnosis not present

## 2020-05-10 DIAGNOSIS — E7800 Pure hypercholesterolemia, unspecified: Secondary | ICD-10-CM | POA: Diagnosis not present

## 2020-05-10 DIAGNOSIS — I25810 Atherosclerosis of coronary artery bypass graft(s) without angina pectoris: Secondary | ICD-10-CM | POA: Diagnosis not present

## 2020-05-10 DIAGNOSIS — I480 Paroxysmal atrial fibrillation: Secondary | ICD-10-CM | POA: Diagnosis not present

## 2020-05-10 DIAGNOSIS — I255 Ischemic cardiomyopathy: Secondary | ICD-10-CM | POA: Diagnosis not present

## 2020-05-10 DIAGNOSIS — N400 Enlarged prostate without lower urinary tract symptoms: Secondary | ICD-10-CM | POA: Diagnosis not present

## 2020-05-10 DIAGNOSIS — I10 Essential (primary) hypertension: Secondary | ICD-10-CM | POA: Diagnosis not present

## 2020-05-25 ENCOUNTER — Telehealth: Payer: Self-pay | Admitting: Oncology

## 2020-05-25 NOTE — Telephone Encounter (Signed)
Rescheduled appts per 5/27 sch msg. Pt confirmed new appt date and time.

## 2020-06-01 DIAGNOSIS — Z1389 Encounter for screening for other disorder: Secondary | ICD-10-CM | POA: Diagnosis not present

## 2020-06-01 DIAGNOSIS — Z0000 Encounter for general adult medical examination without abnormal findings: Secondary | ICD-10-CM | POA: Diagnosis not present

## 2020-06-01 DIAGNOSIS — I10 Essential (primary) hypertension: Secondary | ICD-10-CM | POA: Diagnosis not present

## 2020-06-01 DIAGNOSIS — I255 Ischemic cardiomyopathy: Secondary | ICD-10-CM | POA: Diagnosis not present

## 2020-06-01 DIAGNOSIS — R413 Other amnesia: Secondary | ICD-10-CM | POA: Diagnosis not present

## 2020-06-01 DIAGNOSIS — R7301 Impaired fasting glucose: Secondary | ICD-10-CM | POA: Diagnosis not present

## 2020-06-01 DIAGNOSIS — Z789 Other specified health status: Secondary | ICD-10-CM | POA: Diagnosis not present

## 2020-06-01 DIAGNOSIS — H9193 Unspecified hearing loss, bilateral: Secondary | ICD-10-CM | POA: Diagnosis not present

## 2020-06-01 DIAGNOSIS — N3945 Continuous leakage: Secondary | ICD-10-CM | POA: Diagnosis not present

## 2020-06-01 DIAGNOSIS — E7800 Pure hypercholesterolemia, unspecified: Secondary | ICD-10-CM | POA: Diagnosis not present

## 2020-06-01 DIAGNOSIS — N400 Enlarged prostate without lower urinary tract symptoms: Secondary | ICD-10-CM | POA: Diagnosis not present

## 2020-06-16 ENCOUNTER — Inpatient Hospital Stay: Payer: PPO | Attending: Oncology | Admitting: Oncology

## 2020-06-16 ENCOUNTER — Other Ambulatory Visit: Payer: Self-pay

## 2020-06-16 ENCOUNTER — Telehealth: Payer: Self-pay | Admitting: Oncology

## 2020-06-16 VITALS — BP 111/47 | HR 64 | Temp 97.9°F | Resp 17 | Wt 167.5 lb

## 2020-06-16 DIAGNOSIS — C44329 Squamous cell carcinoma of skin of other parts of face: Secondary | ICD-10-CM | POA: Insufficient documentation

## 2020-06-16 DIAGNOSIS — Z951 Presence of aortocoronary bypass graft: Secondary | ICD-10-CM | POA: Insufficient documentation

## 2020-06-16 DIAGNOSIS — C3412 Malignant neoplasm of upper lobe, left bronchus or lung: Secondary | ICD-10-CM | POA: Insufficient documentation

## 2020-06-16 DIAGNOSIS — I2510 Atherosclerotic heart disease of native coronary artery without angina pectoris: Secondary | ICD-10-CM | POA: Diagnosis not present

## 2020-06-16 DIAGNOSIS — N401 Enlarged prostate with lower urinary tract symptoms: Secondary | ICD-10-CM | POA: Diagnosis not present

## 2020-06-16 DIAGNOSIS — Z85828 Personal history of other malignant neoplasm of skin: Secondary | ICD-10-CM | POA: Insufficient documentation

## 2020-06-16 NOTE — Progress Notes (Signed)
  [facility] OFFICE PROGRESS NOTE   Diagnosis: Non-small cell lung cancer  INTERVAL HISTORY:   [name] returns as scheduled.  He feels well.  He has urinary incontinence after undergoing a procedure at the urology center.  He is scheduled for an appointment with Dr. Rosana Hoes.  He continues follow-up with Dr. Servando Snare.  A CT on 02/03/2020 revealed no evidence of recurrent lung cancer.  He reports that he is in need of a left shoulder replacement and carpal tunnel surgery.  He continues follow-up with Dr. Ronnald Ramp for evaluation of skin cancer.  Objective:  Vital signs in last 24 hours:  There were no vitals taken for this visit.    HEENT: Neck without mass Lymphatics: No cervical, supraclavicular, axillary, or inguinal nodes Resp: Lungs clear bilaterally Cardio: Regular rate and rhythm GI: No hepatosplenomegaly Vascular: No leg edema  Skin: Scars over the face without evidence of recurrent tumor.  Slightly nodular area at the right cheek inferior to the scar   Medications: I have reviewed the patient's current medications.   Assessment/Plan:  1.Squamous cell carcinoma of the right cheek, status post Mohs surgery in 2004  2. Local recurrence of squamous cell carcinoma and a right face lymph node in 2006, status post surgical excision followed by radiation  3. Left hilar fullness on a chest x-ray 02/26/2012-stable compared to a chest x-ray from February of 2012 and new compared to a chest x-ray 2007 . A chest CT on 05/05/2012 confirmed a left upper lung nodule consistent with a primary bronchogenic carcinoma. A PET scan on 05/18/12 confirmed a hypermetabolic left upper lung nodule with no evidence of thoracic nodal or extrathoracic hypermetabolic disease  -He is status post a left upper lobectomy on 06/16/2012 with the pathology confirming a 3.8 cm squamous cell carcinoma (T2a, N0)  -Restaging CT 06/16/2014 without evidence of recurrent disease  -Restaging CT  12/15/2014 without evidence of recurrent disease -Restaging CT 12/06/2015-no evidence of recurrent disease -Restaging chest CT 01/02/2017-no evidence of recurrent disease -Restaging chest CT 01/28/2018-no evidence of recurrent disease -Restaging chest CT 01/28/2019-no evidence of recurrent disease -Restaging chest CT 02/03/2020-no evidence of recurrent disease 4. History of BPH , status post a laser vaporization of the prostate in February 2014 and TUR August 2015  5. History of multiple skin cancers including basal cell carcinoma and squamous cell carcinomas  6. coronary artery disease, decreased left ventricular ejection , Status post coronary artery bypass surgery July 2017    Disposition:  [name] is in remission from lung cancer and skin cancer.  He continues follow-up with Dr. Servando Snare for a yearly screening chest CT.  He will see Dr. Ronnald Ramp for skin cancer follow-up and evaluation of the nodular change at the right face.  He would like to continue follow-up at the Cancer center.  He will return for an office visit in 1 year.  Betsy Coder, MD  06/16/2020  9:38 AM

## 2020-06-16 NOTE — Telephone Encounter (Signed)
Scheduled per 6/18 los. Printed avs and calendar for pt.

## 2020-06-26 ENCOUNTER — Ambulatory Visit: Payer: PPO | Admitting: Oncology

## 2020-07-12 DIAGNOSIS — L570 Actinic keratosis: Secondary | ICD-10-CM | POA: Diagnosis not present

## 2020-07-12 DIAGNOSIS — D485 Neoplasm of uncertain behavior of skin: Secondary | ICD-10-CM | POA: Diagnosis not present

## 2020-07-12 DIAGNOSIS — L821 Other seborrheic keratosis: Secondary | ICD-10-CM | POA: Diagnosis not present

## 2020-07-12 DIAGNOSIS — C44319 Basal cell carcinoma of skin of other parts of face: Secondary | ICD-10-CM | POA: Diagnosis not present

## 2020-07-12 DIAGNOSIS — Z85828 Personal history of other malignant neoplasm of skin: Secondary | ICD-10-CM | POA: Diagnosis not present

## 2020-08-14 DIAGNOSIS — N529 Male erectile dysfunction, unspecified: Secondary | ICD-10-CM | POA: Diagnosis not present

## 2020-08-14 DIAGNOSIS — E291 Testicular hypofunction: Secondary | ICD-10-CM | POA: Diagnosis not present

## 2020-08-14 DIAGNOSIS — N200 Calculus of kidney: Secondary | ICD-10-CM | POA: Diagnosis not present

## 2020-08-14 DIAGNOSIS — N3946 Mixed incontinence: Secondary | ICD-10-CM | POA: Diagnosis not present

## 2020-08-14 DIAGNOSIS — N401 Enlarged prostate with lower urinary tract symptoms: Secondary | ICD-10-CM | POA: Diagnosis not present

## 2020-08-14 DIAGNOSIS — N138 Other obstructive and reflux uropathy: Secondary | ICD-10-CM | POA: Diagnosis not present

## 2020-08-16 DIAGNOSIS — I480 Paroxysmal atrial fibrillation: Secondary | ICD-10-CM | POA: Diagnosis not present

## 2020-08-16 DIAGNOSIS — I25810 Atherosclerosis of coronary artery bypass graft(s) without angina pectoris: Secondary | ICD-10-CM | POA: Diagnosis not present

## 2020-08-16 DIAGNOSIS — I10 Essential (primary) hypertension: Secondary | ICD-10-CM | POA: Diagnosis not present

## 2020-08-16 DIAGNOSIS — I255 Ischemic cardiomyopathy: Secondary | ICD-10-CM | POA: Diagnosis not present

## 2020-08-16 DIAGNOSIS — N400 Enlarged prostate without lower urinary tract symptoms: Secondary | ICD-10-CM | POA: Diagnosis not present

## 2020-08-16 DIAGNOSIS — E7800 Pure hypercholesterolemia, unspecified: Secondary | ICD-10-CM | POA: Diagnosis not present

## 2020-08-16 DIAGNOSIS — Z85118 Personal history of other malignant neoplasm of bronchus and lung: Secondary | ICD-10-CM | POA: Diagnosis not present

## 2020-10-23 DIAGNOSIS — R32 Unspecified urinary incontinence: Secondary | ICD-10-CM | POA: Diagnosis not present

## 2020-10-23 DIAGNOSIS — Z85118 Personal history of other malignant neoplasm of bronchus and lung: Secondary | ICD-10-CM | POA: Diagnosis not present

## 2020-10-23 DIAGNOSIS — N35912 Unspecified bulbous urethral stricture, male: Secondary | ICD-10-CM | POA: Diagnosis not present

## 2020-10-23 DIAGNOSIS — N401 Enlarged prostate with lower urinary tract symptoms: Secondary | ICD-10-CM | POA: Diagnosis not present

## 2020-10-23 DIAGNOSIS — Z87442 Personal history of urinary calculi: Secondary | ICD-10-CM | POA: Diagnosis not present

## 2020-11-08 DIAGNOSIS — E782 Mixed hyperlipidemia: Secondary | ICD-10-CM | POA: Diagnosis not present

## 2020-11-08 DIAGNOSIS — I10 Essential (primary) hypertension: Secondary | ICD-10-CM | POA: Diagnosis not present

## 2020-11-08 DIAGNOSIS — I5022 Chronic systolic (congestive) heart failure: Secondary | ICD-10-CM | POA: Diagnosis not present

## 2020-11-08 DIAGNOSIS — I2510 Atherosclerotic heart disease of native coronary artery without angina pectoris: Secondary | ICD-10-CM | POA: Diagnosis not present

## 2020-11-15 DIAGNOSIS — N398 Other specified disorders of urinary system: Secondary | ICD-10-CM | POA: Diagnosis not present

## 2020-11-15 DIAGNOSIS — N393 Stress incontinence (female) (male): Secondary | ICD-10-CM | POA: Diagnosis not present

## 2020-11-15 DIAGNOSIS — N3281 Overactive bladder: Secondary | ICD-10-CM | POA: Diagnosis not present

## 2020-11-22 DIAGNOSIS — L821 Other seborrheic keratosis: Secondary | ICD-10-CM | POA: Diagnosis not present

## 2020-11-22 DIAGNOSIS — L570 Actinic keratosis: Secondary | ICD-10-CM | POA: Diagnosis not present

## 2020-11-22 DIAGNOSIS — Z85828 Personal history of other malignant neoplasm of skin: Secondary | ICD-10-CM | POA: Diagnosis not present

## 2020-11-22 DIAGNOSIS — H61001 Unspecified perichondritis of right external ear: Secondary | ICD-10-CM | POA: Diagnosis not present

## 2020-11-29 DIAGNOSIS — M79602 Pain in left arm: Secondary | ICD-10-CM | POA: Diagnosis not present

## 2020-11-29 DIAGNOSIS — G5603 Carpal tunnel syndrome, bilateral upper limbs: Secondary | ICD-10-CM | POA: Diagnosis not present

## 2020-11-29 DIAGNOSIS — I10 Essential (primary) hypertension: Secondary | ICD-10-CM | POA: Diagnosis not present

## 2020-11-29 DIAGNOSIS — M79601 Pain in right arm: Secondary | ICD-10-CM | POA: Diagnosis not present

## 2020-11-29 DIAGNOSIS — I25810 Atherosclerosis of coronary artery bypass graft(s) without angina pectoris: Secondary | ICD-10-CM | POA: Diagnosis not present

## 2020-11-29 DIAGNOSIS — Z789 Other specified health status: Secondary | ICD-10-CM | POA: Diagnosis not present

## 2020-11-29 DIAGNOSIS — I255 Ischemic cardiomyopathy: Secondary | ICD-10-CM | POA: Diagnosis not present

## 2020-12-05 DIAGNOSIS — N6321 Unspecified lump in the left breast, upper outer quadrant: Secondary | ICD-10-CM | POA: Diagnosis not present

## 2020-12-06 ENCOUNTER — Other Ambulatory Visit: Payer: Self-pay | Admitting: Internal Medicine

## 2020-12-06 DIAGNOSIS — N63 Unspecified lump in unspecified breast: Secondary | ICD-10-CM

## 2020-12-08 DIAGNOSIS — N6321 Unspecified lump in the left breast, upper outer quadrant: Secondary | ICD-10-CM | POA: Diagnosis not present

## 2020-12-08 DIAGNOSIS — N62 Hypertrophy of breast: Secondary | ICD-10-CM | POA: Diagnosis not present

## 2020-12-11 DIAGNOSIS — N401 Enlarged prostate with lower urinary tract symptoms: Secondary | ICD-10-CM | POA: Diagnosis not present

## 2020-12-11 DIAGNOSIS — N529 Male erectile dysfunction, unspecified: Secondary | ICD-10-CM | POA: Diagnosis not present

## 2020-12-11 DIAGNOSIS — N3946 Mixed incontinence: Secondary | ICD-10-CM | POA: Diagnosis not present

## 2020-12-11 DIAGNOSIS — N138 Other obstructive and reflux uropathy: Secondary | ICD-10-CM | POA: Diagnosis not present

## 2020-12-11 DIAGNOSIS — N200 Calculus of kidney: Secondary | ICD-10-CM | POA: Diagnosis not present

## 2020-12-18 ENCOUNTER — Other Ambulatory Visit: Payer: PPO

## 2021-01-17 ENCOUNTER — Other Ambulatory Visit: Payer: Self-pay | Admitting: Cardiothoracic Surgery

## 2021-01-17 DIAGNOSIS — C3490 Malignant neoplasm of unspecified part of unspecified bronchus or lung: Secondary | ICD-10-CM

## 2021-01-29 DIAGNOSIS — M19039 Primary osteoarthritis, unspecified wrist: Secondary | ICD-10-CM | POA: Diagnosis not present

## 2021-01-29 DIAGNOSIS — G5603 Carpal tunnel syndrome, bilateral upper limbs: Secondary | ICD-10-CM | POA: Diagnosis not present

## 2021-01-29 DIAGNOSIS — M13841 Other specified arthritis, right hand: Secondary | ICD-10-CM | POA: Diagnosis not present

## 2021-02-08 ENCOUNTER — Other Ambulatory Visit: Payer: Self-pay

## 2021-02-08 ENCOUNTER — Ambulatory Visit
Admission: RE | Admit: 2021-02-08 | Discharge: 2021-02-08 | Disposition: A | Discharge status: Home / Self Care | Payer: PPO | Source: Ambulatory Visit | Attending: Cardiothoracic Surgery | Admitting: Cardiothoracic Surgery

## 2021-02-08 ENCOUNTER — Ambulatory Visit: Payer: PPO | Admitting: Cardiothoracic Surgery

## 2021-02-08 ENCOUNTER — Encounter: Payer: Self-pay | Admitting: Cardiothoracic Surgery

## 2021-02-08 VITALS — BP 138/80 | HR 65 | Temp 97.6°F | Resp 20 | Ht 64.0 in | Wt 172.0 lb

## 2021-02-08 DIAGNOSIS — Z85118 Personal history of other malignant neoplasm of bronchus and lung: Secondary | ICD-10-CM | POA: Diagnosis not present

## 2021-02-08 DIAGNOSIS — Z902 Acquired absence of lung [part of]: Secondary | ICD-10-CM | POA: Diagnosis not present

## 2021-02-08 DIAGNOSIS — C3490 Malignant neoplasm of unspecified part of unspecified bronchus or lung: Secondary | ICD-10-CM | POA: Diagnosis not present

## 2021-02-08 DIAGNOSIS — J984 Other disorders of lung: Secondary | ICD-10-CM | POA: Diagnosis not present

## 2021-02-08 DIAGNOSIS — I700 Atherosclerosis of aorta: Secondary | ICD-10-CM | POA: Diagnosis not present

## 2021-02-08 DIAGNOSIS — I2510 Atherosclerotic heart disease of native coronary artery without angina pectoris: Secondary | ICD-10-CM | POA: Diagnosis not present

## 2021-02-08 NOTE — Progress Notes (Signed)
[address]       [place] [address]             [phone]      [place] [facility] [place] [facility] Medical Record #[id number] Date of Birth: Jul 19, 1936  Referring: Corey Skains, MD Primary Care: Lavone Orn, MD  Chief Complaint:   POST OP FOLLOW UP 07/19/2016  OPERATIVE REPORT PREOPERATIVE DIAGNOSIS:  Three-vessel coronary artery disease with ischemic cardiomyopathy, ejection fraction 20%. POSTOPERATIVE DIAGNOSIS:  Three-vessel coronary artery disease with ischemic cardiomyopathy, ejection fraction 20%. Nonunion of the right sternoclavicular joint. PROCEDURE:  Coronary artery bypass grafting x4 with left internal mammary to the left anterior descending coronary artery, reverse saphenous vein graft to the diagonal coronary artery, reverse saphenous vein graft to the circumflex coronary artery, reverse saphenous vein graft to the posterior descending coronary artery with right thigh and calf greater saphenous endoscopic vein harvesting. Plating of right sternoclavicular joint SURGEON:  Lanelle Bal, MD.  06/19/2012  OPERATIVE REPORT  PREOPERATIVE DIAGNOSIS: Left upper lobe lung mass.  POSTOPERATIVE DIAGNOSES: Left upper lobe lung mass, squamous cell  carcinoma, non-small cell carcinoma by frozen section.  PROCEDURE PERFORMED: Video bronchoscopy, left video-assisted  thoracoscopy with mini thoracotomy, resection of left upper lobe and  portion of superior segment left lower lobe and lymph node dissection.   Stage IB, (pT2a,pN0,cM0 SQUAMOUS CELL CARCINOMA, 3.8 CM.)   History of Present Illness:    Patient comes to the office today in follow-up low-dose CT scan of the chest for follow-up on his previous history of left upper lobectomy for stage Ib carcinoma of the lung.,  Resected 2013.  In 2017 he underwent coronary artery bypass grafting for three-vessel coronary artery disease and severe LV dysfunction and dilated cardiomyopathy.   Patient  denies any definite chest pain.  He does have some shortness of breath with exertion.  But notes he climbs on a regular basis 16 steps from his basement without difficulty.   He continues to be followed from a cardiac standpoint by Dr. Nehemiah Massed.  Recently had left breast evaluated because of the concern for possible mass, he notes his evaluation did not reveal anything significant   Past Medical History:  Diagnosis Date  . Arthritis    JOINT PAIN RIGHT HAND  . BPH (benign prostatic hyperplasia)    [name]/elevated PSA, prostate biopsy x4 including one saturation biopsy, laser treatment 2/14; NOCTURIA  . CAD (coronary artery disease) 2013   cath 05/2012 showing 80-90% stenosis of a trifurcating diagonal #1, 70-80% stenosis of OM3 and 90% stenosis of distal LCx after OM3 - medical management, [place]  . Cardiomyopathy ([place])    dilated cardiomyopathy EF 30%, MUGA  EF 42% 08/2012  . Carotid artery occlusion    carotid artery bruit  . Chronic kidney disease    kidney stones -small passed.  . Diastolic dysfunction   . Heart murmur    as a child  . History of shingles   . Hyperlipidemia    statin intolerant  . Hypertension   . Lung mass    Stage 1B non-small cell lung CA s/p resection 2013  . PVC (premature ventricular contraction)   . Shingles   . Sigmoid diverticulosis   . Skin cancer    Multiple skin cancers   . Squamous cell carcinoma, face    history of right face with mets to right upper cheek in 2004  and facial lymph node reoccurence post surgery with XRT  . Trigger finger  Bilateral     Social History   Tobacco Use  Smoking Status Former Smoker  . Types: Cigarettes  . Quit date: 12/30/1965  . Years since quitting: 55.1  Smokeless Tobacco Never Used    Social History   Substance and Sexual Activity  Alcohol Use Yes  . Alcohol/week: 5.0 - 6.0 standard drinks  . Types: 3 - 4 Glasses of wine, 2 Cans of beer per week   Comment: infrequently- "indulge heavly when I  do"     Allergies  Allergen Reactions  . Ciprofloxacin Other (See Comments)    Patient took longer to process questions you ask him   . Ramipril Cough  . Statins     Confusion Muscle pain     Current Outpatient Medications  Medication Sig Dispense Refill  . Ascorbic Acid (VITAMIN C) 1000 MG tablet Take 1,000 mg by mouth daily.    Marland Kitchen aspirin 81 MG tablet Take 81 mg by mouth daily.    . carvedilol (COREG) 6.25 MG tablet TAKE 1 TABLET (6.25 MG TOTAL) BY MOUTH 2 (TWO) TIMES DAILY. NEEDS OFFICE VISIT [phone] 60 tablet 0  . ezetimibe (ZETIA) 10 MG tablet Take 10 mg by mouth daily.    . Flaxseed, Linseed, (FLAXSEED OIL PO) Take 1,400 mg by mouth daily.     Marland Kitchen GARLIC PO Take 0,865 mg by mouth 2 (two) times daily.     Marland Kitchen losartan (COZAAR) 25 MG tablet TAKE 1 TABLET (25 MG TOTAL) BY MOUTH AT BEDTIME. 90 tablet 3  . Multiple Vitamin (MULTIVITAMIN WITH MINERALS) TABS tablet Take 1 tablet by mouth daily.    . Omega-3 Fatty Acids (FISH OIL) 1000 MG CAPS Take 1,000 mg by mouth in the morning and at bedtime.    . Red Yeast Rice Extract 600 MG CAPS Take 1 capsule by mouth 2 (two) times daily.    Marland Kitchen spironolactone (ALDACTONE) 25 MG tablet Take 1 tablet (25 mg total) by mouth daily. Call patient for office visit [phone] 90 tablet 0  . ZINC OXIDE PO Take 500 mg by mouth daily.     No current facility-administered medications for this visit.    Family History  Problem Relation Age of Onset  . Diabetes Father   . Psoriasis Child   . Heart disease Child        resuscitated from cardiac arrest from Brugada's syndrome  . Lung cancer Mother   . Heart disease Mother     Physical Exam: BP 138/80   Pulse 65   Temp 97.6 F (36.4 C) (Skin)   Resp 20   Ht 5\' 4"  (1.626 m)   Wt 172 lb (78 kg)   SpO2 95%   BMI 29.52 kg/m  General appearance: alert, cooperative and no distress Neck: no adenopathy, no carotid bruit, no JVD, supple, symmetrical, trachea midline and thyroid not enlarged,  symmetric, no tenderness/mass/nodules Lymph nodes: Cervical, supraclavicular, and axillary nodes normal. Resp: clear to auscultation bilaterally Cardio: regular rate and rhythm, S1, S2 normal, no murmur, click, rub or gallop Extremities: extremities normal, atraumatic, no cyanosis or edema Neurologic: Grossly normal No palatable breast  mass on the left noted  Diagnostic Studies & Laboratory data:     Recent Radiology Findings: CT CHEST WO CONTRAST  Result Date: 02/08/2021 CLINICAL DATA:  Non-small-cell lung cancer.  Restaging. EXAM: CT CHEST WITHOUT CONTRAST TECHNIQUE: Multidetector CT imaging of the chest was performed following the standard protocol without IV contrast. COMPARISON:  02/03/2020 FINDINGS: Cardiovascular: The heart size is  normal. No substantial pericardial effusion. Coronary artery calcification is evident. Atherosclerotic calcification is noted in the wall of the thoracic aorta. Status post CABG. Mediastinum/Nodes: No mediastinal lymphadenopathy. No evidence for gross hilar lymphadenopathy although assessment is limited by the lack of intravenous contrast on today's study. The esophagus has normal imaging features. There is no axillary lymphadenopathy. Lungs/Pleura: Scarring again noted medial right apex. Stable surgical changes in volume loss left hemithorax. No new suspicious pulmonary nodule or mass. No focal airspace consolidation. No pleural effusion. Upper Abdomen: 13 mm hypodensity posterior right hepatic dome is stable in the interval consistent with benign etiology such as cyst. Diverticular changes are noted within the visualized portion of the splenic flexure. Musculoskeletal: No worrisome lytic or sclerotic osseous abnormality. IMPRESSION: 1. Stable exam. Status post left upper lobectomy. No new or progressive findings to suggest recurrent or metastatic disease. 2. Aortic Atherosclerosis (ICD10-I70.0). Electronically Signed   By: Misty Stanley M.D.   On: 02/08/2021 08:24   I have independently reviewed the above radiology studies  and reviewed the findings with the patient.   CT Chest Wo Contrast  Result Date: 02/03/2020 CLINICAL DATA:  Status post left upper lobectomy for lung cancer in 2013. Restaging. EXAM: CT CHEST WITHOUT CONTRAST TECHNIQUE: Multidetector CT imaging of the chest was performed following the standard protocol without IV contrast. COMPARISON:  01/28/2019 chest CT. FINDINGS: Cardiovascular: Stable mild cardiomegaly. No significant pericardial effusion/thickening. Left main and 3 vessel coronary atherosclerosis status post CABG. Atherosclerotic nonaneurysmal thoracic aorta. Stable dilated main pulmonary artery (3.7 cm diameter). Mediastinum/Nodes: Apparent right hemithyroidectomy. Subcentimeter hypodense left thyroid nodules are unchanged and require no follow-up. Unremarkable esophagus. No pathologically enlarged axillary, mediastinal or hilar lymph nodes, noting limited sensitivity for the detection of hilar adenopathy on this noncontrast study. Lungs/Pleura: No pneumothorax. No pleural effusion. Status post left upper lobectomy. No acute consolidative airspace disease, lung masses or significant pulmonary nodules. Upper abdomen: Simple 1.3 cm posterior right liver cyst. Colonic diverticulosis. Musculoskeletal: No aggressive appearing focal osseous lesions. Symmetric mild bilateral gynecomastia, unchanged. New discontinuity in the superior most sternotomy wire. Otherwise intact sternotomy wires. Moderate thoracic spondylosis. IMPRESSION: 1. No evidence of local tumor recurrence status post left upper lobectomy. 2. No evidence of metastatic disease in the chest. 3. Stable mild cardiomegaly. Stable dilated main pulmonary artery, suggesting chronic pulmonary arterial hypertension. 4. Aortic Atherosclerosis (ICD10-I70.0). Aortic Atherosclerosis (ICD10-I70.0). Electronically Signed   By: Ilona Sorrel M.D.   On: 02/03/2020 10:38     Ct Chest Wo Contrast  Result  Date: 01/28/2018 CLINICAL DATA:  85 year old male with history of left upper lobectomy for lung cancer in 2015. Former smoker (quit 55 years ago). EXAM: CT CHEST WITHOUT CONTRAST TECHNIQUE: Multidetector CT imaging of the chest was performed following the standard protocol without IV contrast. COMPARISON:  Chest CT 01/02/2017. FINDINGS: Cardiovascular: Heart size is borderline enlarged. There is no significant pericardial fluid, thickening or pericardial calcification. There is aortic atherosclerosis, as well as atherosclerosis of the great vessels of the mediastinum and the coronary arteries, including calcified atherosclerotic plaque in the left main, left anterior descending, left circumflex and right coronary arteries. Status post median sternotomy for CABG including LIMA to the LAD. Dilatation of the pulmonic trunk (3.6 cm in diameter). Mediastinum/Nodes: No pathologically enlarged mediastinal or hilar lymph nodes. Please note that accurate exclusion of hilar adenopathy is limited on noncontrast CT scans. Esophagus is unremarkable in appearance. No axillary lymphadenopathy. Lungs/Pleura: Status post left upper lobectomy. Compensatory hyperexpansion of the left lower lobe. Chronic  elevation of the left hemidiaphragm. Areas of mild postoperative scarring are again noted throughout the left lower lobe. No suspicious appearing pulmonary nodules or masses are noted. No acute consolidative airspace disease. No pleural effusions. Upper Abdomen: 11 mm low-attenuation lesion in segment 7 of the liver, incompletely characterized on today's noncontrast CT examination, but stable to the prior study, likely a cyst. Aortic atherosclerosis. Colonic diverticulosis in the proximal descending colon. Musculoskeletal: There are no aggressive appearing lytic or blastic lesions noted in the visualized portions of the skeleton. Median sternotomy wires. IMPRESSION: 1. Status post left upper lobectomy. No findings to suggest local  recurrence of disease or metastatic disease in the thorax. 2. Aortic atherosclerosis, in addition to left main and 3 vessel coronary artery disease. Status post median sternotomy for CABG including LIMA to the LAD. 3. Additional incidental findings, as above. Aortic Atherosclerosis (ICD10-I70.0). Electronically Signed   By: Vinnie Langton M.D.   On: 01/28/2018 12:06   Ct Chest Wo Contrast  Result Date: 01/02/2017 CLINICAL DATA:  Stage IB squamous cell lung carcinoma of the left upper lobe status post left upper lobectomy 06/16/2012, presenting for restaging. No current symptoms are reported. Remote smoking history. EXAM: CT CHEST WITHOUT CONTRAST TECHNIQUE: Multidetector CT imaging of the chest was performed following the standard protocol without IV contrast. COMPARISON:  07/18/2016 chest CT.  08/26/2016 chest radiograph. FINDINGS: Cardiovascular: Stable mild cardiomegaly. No significant pericardial fluid/thickening. Left main, left anterior descending, left circumflex and right coronary atherosclerosis status post CABG with ascending aortic and left internal mammary bypass grafts. Atherosclerotic nonaneurysmal thoracic aorta. Stable enlarged main pulmonary artery (3.5 cm diameter). Mediastinum/Nodes: No discrete thyroid nodules. Unremarkable esophagus. No pathologically enlarged axillary, mediastinal or gross hilar lymph nodes, noting limited sensitivity for the detection of hilar adenopathy on this noncontrast study. Lungs/Pleura: No pneumothorax. No pleural effusion. Stable volume loss in the left hemithorax status post left upper lobectomy. Peripheral basilar right lower lobe 2 mm solid pulmonary nodule (series 4/ image 102) is stable back to 12/15/2014 and considered benign. Stable mild postsurgical scarring in the basilar left lower lobe. No acute consolidative airspace disease, lung masses or new significant pulmonary nodules. Upper abdomen: Simple 1.1 cm posterior upper right liver cyst. Clustered  nonobstructing stones in the lower left kidney measuring up to 4 mm. Left colonic diverticulosis. Musculoskeletal: No aggressive appearing focal osseous lesions. Surgical plate with interlocking screws in the anterior right sternoclavicular joint. Sternotomy wires appear aligned and intact. Moderate thoracic spondylosis. Mild gynecomastia, asymmetric to the left, new. IMPRESSION: 1. No evidence of local tumor recurrence status post left upper lobectomy. 2. No evidence of metastatic disease in the chest. 3. New mild gynecomastia, asymmetric to the left. 4. Stable mild cardiomegaly. Stable dilated main pulmonary artery suggesting chronic pulmonary arterial hypertension. 5. Aortic atherosclerosis. Left main and 3 vessel coronary atherosclerosis status post CABG . 6. Nonobstructing lower left nephrolithiasis. Electronically Signed   By: Ilona Sorrel M.D.   On: 01/02/2017 11:49      Recent Lab Findings: Lab Results  Component Value Date   WBC 6.9 08/21/2019   HGB 12.4 (L) 08/21/2019   HCT 38.3 (L) 08/21/2019   PLT 172 08/21/2019   GLUCOSE 93 08/21/2019   CHOL 157 03/17/2017   TRIG 106 03/17/2017   HDL 61 03/17/2017   LDLDIRECT 147.6 11/08/2013   LDLCALC 75 03/17/2017   ALT 19 08/21/2019   AST 18 08/21/2019   NA 139 08/21/2019   K 4.1 08/21/2019   CL 106 08/21/2019  CREATININE 1.46 (H) 08/21/2019   BUN 31 (H) 08/21/2019   CO2 22 08/21/2019   TSH 1.645 07/22/2016   INR 1.50 (H) 07/19/2016   HGBA1C 5.8 (H) 07/17/2016      Assessment / Plan:   Patient remains clinically stable after coronary artery bypass grafting with severe LV dysfunction in 2017 and resection of stage Ib non-small cell carcinoma of the left upper lobe with left upper lobectomy in 2013. -CT done today  scan today shows no evidence of recurrence  Patient desires to come back in 1 year with follow-up low-dose CT of the chest.  Due to age he is not eligible for lung cancer screening program (limited to age 60). He would  like to be established with a surgeon -     Grace Isaac MD      [address] [place] [address] Office [phone]   Beeper [phone]  02/08/2021 10:09 AM

## 2021-02-09 DIAGNOSIS — Z135 Encounter for screening for eye and ear disorders: Secondary | ICD-10-CM | POA: Diagnosis not present

## 2021-02-09 DIAGNOSIS — Z961 Presence of intraocular lens: Secondary | ICD-10-CM | POA: Diagnosis not present

## 2021-02-21 DIAGNOSIS — D1801 Hemangioma of skin and subcutaneous tissue: Secondary | ICD-10-CM | POA: Diagnosis not present

## 2021-02-21 DIAGNOSIS — L570 Actinic keratosis: Secondary | ICD-10-CM | POA: Diagnosis not present

## 2021-02-21 DIAGNOSIS — L821 Other seborrheic keratosis: Secondary | ICD-10-CM | POA: Diagnosis not present

## 2021-02-21 DIAGNOSIS — D485 Neoplasm of uncertain behavior of skin: Secondary | ICD-10-CM | POA: Diagnosis not present

## 2021-02-21 DIAGNOSIS — D2272 Melanocytic nevi of left lower limb, including hip: Secondary | ICD-10-CM | POA: Diagnosis not present

## 2021-02-21 DIAGNOSIS — C44629 Squamous cell carcinoma of skin of left upper limb, including shoulder: Secondary | ICD-10-CM | POA: Diagnosis not present

## 2021-02-21 DIAGNOSIS — D2271 Melanocytic nevi of right lower limb, including hip: Secondary | ICD-10-CM | POA: Diagnosis not present

## 2021-02-21 DIAGNOSIS — Z85828 Personal history of other malignant neoplasm of skin: Secondary | ICD-10-CM | POA: Diagnosis not present

## 2021-02-21 DIAGNOSIS — D692 Other nonthrombocytopenic purpura: Secondary | ICD-10-CM | POA: Diagnosis not present

## 2021-02-23 DIAGNOSIS — G5602 Carpal tunnel syndrome, left upper limb: Secondary | ICD-10-CM | POA: Diagnosis not present

## 2021-03-12 DIAGNOSIS — G5602 Carpal tunnel syndrome, left upper limb: Secondary | ICD-10-CM | POA: Diagnosis not present

## 2021-03-28 DIAGNOSIS — G5601 Carpal tunnel syndrome, right upper limb: Secondary | ICD-10-CM | POA: Diagnosis not present

## 2021-03-29 DIAGNOSIS — N138 Other obstructive and reflux uropathy: Secondary | ICD-10-CM | POA: Diagnosis not present

## 2021-03-29 DIAGNOSIS — N401 Enlarged prostate with lower urinary tract symptoms: Secondary | ICD-10-CM | POA: Diagnosis not present

## 2021-03-29 DIAGNOSIS — N528 Other male erectile dysfunction: Secondary | ICD-10-CM | POA: Diagnosis not present

## 2021-03-29 DIAGNOSIS — N99110 Postprocedural urethral stricture, male, meatal: Secondary | ICD-10-CM | POA: Diagnosis not present

## 2021-03-29 DIAGNOSIS — N3946 Mixed incontinence: Secondary | ICD-10-CM | POA: Diagnosis not present

## 2021-04-03 DIAGNOSIS — H903 Sensorineural hearing loss, bilateral: Secondary | ICD-10-CM | POA: Diagnosis not present

## 2021-04-17 DIAGNOSIS — Z85828 Personal history of other malignant neoplasm of skin: Secondary | ICD-10-CM | POA: Diagnosis not present

## 2021-04-17 DIAGNOSIS — L578 Other skin changes due to chronic exposure to nonionizing radiation: Secondary | ICD-10-CM | POA: Diagnosis not present

## 2021-04-17 DIAGNOSIS — C44629 Squamous cell carcinoma of skin of left upper limb, including shoulder: Secondary | ICD-10-CM | POA: Diagnosis not present

## 2021-04-24 DIAGNOSIS — G5601 Carpal tunnel syndrome, right upper limb: Secondary | ICD-10-CM | POA: Diagnosis not present

## 2021-05-04 DIAGNOSIS — H903 Sensorineural hearing loss, bilateral: Secondary | ICD-10-CM | POA: Diagnosis not present

## 2021-05-10 DIAGNOSIS — M25641 Stiffness of right hand, not elsewhere classified: Secondary | ICD-10-CM | POA: Diagnosis not present

## 2021-05-17 DIAGNOSIS — N35011 Post-traumatic bulbous urethral stricture: Secondary | ICD-10-CM | POA: Diagnosis not present

## 2021-05-17 DIAGNOSIS — N200 Calculus of kidney: Secondary | ICD-10-CM | POA: Diagnosis not present

## 2021-05-17 DIAGNOSIS — E291 Testicular hypofunction: Secondary | ICD-10-CM | POA: Diagnosis not present

## 2021-05-17 DIAGNOSIS — N138 Other obstructive and reflux uropathy: Secondary | ICD-10-CM | POA: Diagnosis not present

## 2021-05-17 DIAGNOSIS — N35914 Unspecified anterior urethral stricture, male: Secondary | ICD-10-CM | POA: Diagnosis not present

## 2021-05-17 DIAGNOSIS — N3946 Mixed incontinence: Secondary | ICD-10-CM | POA: Diagnosis not present

## 2021-05-17 DIAGNOSIS — Z9889 Other specified postprocedural states: Secondary | ICD-10-CM | POA: Diagnosis not present

## 2021-05-17 DIAGNOSIS — N401 Enlarged prostate with lower urinary tract symptoms: Secondary | ICD-10-CM | POA: Diagnosis not present

## 2021-05-17 DIAGNOSIS — N529 Male erectile dysfunction, unspecified: Secondary | ICD-10-CM | POA: Diagnosis not present

## 2021-05-17 DIAGNOSIS — N99114 Postprocedural urethral stricture, male, unspecified: Secondary | ICD-10-CM | POA: Diagnosis not present

## 2021-05-31 DIAGNOSIS — Z85118 Personal history of other malignant neoplasm of bronchus and lung: Secondary | ICD-10-CM | POA: Diagnosis not present

## 2021-05-31 DIAGNOSIS — Z0000 Encounter for general adult medical examination without abnormal findings: Secondary | ICD-10-CM | POA: Diagnosis not present

## 2021-05-31 DIAGNOSIS — I10 Essential (primary) hypertension: Secondary | ICD-10-CM | POA: Diagnosis not present

## 2021-05-31 DIAGNOSIS — R7301 Impaired fasting glucose: Secondary | ICD-10-CM | POA: Diagnosis not present

## 2021-05-31 DIAGNOSIS — Z1389 Encounter for screening for other disorder: Secondary | ICD-10-CM | POA: Diagnosis not present

## 2021-05-31 DIAGNOSIS — I480 Paroxysmal atrial fibrillation: Secondary | ICD-10-CM | POA: Diagnosis not present

## 2021-05-31 DIAGNOSIS — H9193 Unspecified hearing loss, bilateral: Secondary | ICD-10-CM | POA: Diagnosis not present

## 2021-05-31 DIAGNOSIS — I255 Ischemic cardiomyopathy: Secondary | ICD-10-CM | POA: Diagnosis not present

## 2021-05-31 DIAGNOSIS — E7800 Pure hypercholesterolemia, unspecified: Secondary | ICD-10-CM | POA: Diagnosis not present

## 2021-05-31 DIAGNOSIS — N400 Enlarged prostate without lower urinary tract symptoms: Secondary | ICD-10-CM | POA: Diagnosis not present

## 2021-06-08 DIAGNOSIS — N35914 Unspecified anterior urethral stricture, male: Secondary | ICD-10-CM | POA: Diagnosis not present

## 2021-06-08 DIAGNOSIS — N3946 Mixed incontinence: Secondary | ICD-10-CM | POA: Diagnosis not present

## 2021-06-08 DIAGNOSIS — N529 Male erectile dysfunction, unspecified: Secondary | ICD-10-CM | POA: Diagnosis not present

## 2021-06-08 DIAGNOSIS — N200 Calculus of kidney: Secondary | ICD-10-CM | POA: Diagnosis not present

## 2021-06-14 ENCOUNTER — Inpatient Hospital Stay: Payer: PPO | Admitting: Oncology

## 2021-06-27 DIAGNOSIS — E782 Mixed hyperlipidemia: Secondary | ICD-10-CM | POA: Diagnosis not present

## 2021-06-27 DIAGNOSIS — I5022 Chronic systolic (congestive) heart failure: Secondary | ICD-10-CM | POA: Diagnosis not present

## 2021-06-27 DIAGNOSIS — I10 Essential (primary) hypertension: Secondary | ICD-10-CM | POA: Diagnosis not present

## 2021-06-27 DIAGNOSIS — I25118 Atherosclerotic heart disease of native coronary artery with other forms of angina pectoris: Secondary | ICD-10-CM | POA: Diagnosis not present

## 2021-07-12 ENCOUNTER — Inpatient Hospital Stay: Payer: PPO | Attending: Oncology | Admitting: Oncology

## 2021-07-12 ENCOUNTER — Other Ambulatory Visit: Payer: Self-pay

## 2021-07-12 VITALS — BP 111/59 | HR 58 | Temp 97.7°F | Resp 18 | Wt 161.2 lb

## 2021-07-12 DIAGNOSIS — Z85828 Personal history of other malignant neoplasm of skin: Secondary | ICD-10-CM | POA: Diagnosis not present

## 2021-07-12 DIAGNOSIS — C3412 Malignant neoplasm of upper lobe, left bronchus or lung: Secondary | ICD-10-CM | POA: Diagnosis not present

## 2021-07-12 DIAGNOSIS — N400 Enlarged prostate without lower urinary tract symptoms: Secondary | ICD-10-CM | POA: Diagnosis not present

## 2021-07-12 DIAGNOSIS — Z85118 Personal history of other malignant neoplasm of bronchus and lung: Secondary | ICD-10-CM | POA: Insufficient documentation

## 2021-07-12 DIAGNOSIS — Z923 Personal history of irradiation: Secondary | ICD-10-CM | POA: Insufficient documentation

## 2021-07-12 NOTE — Progress Notes (Signed)
[facility] OFFICE PROGRESS NOTE   Diagnosis: Non-small cell lung cancer  INTERVAL HISTORY:   [name] Cu returns as scheduled.  Good appetite and energy level.  He reports intentional weight loss with a change in his diet.  He continues follow-up with Dr. Ronnald Ramp for management of skin cancers.  A chest CT in February was negative for recurrent lung cancer.  Objective:  Vital signs in last 24 hours:  Blood pressure (!) 111/59, pulse (!) 58, temperature 97.7 F (36.5 C), temperature source Oral, resp. rate 18, weight 161 lb 3.2 oz (73.1 kg), SpO2 100 %.    Lymphatics: No cervical, supraclavicular, axillary, or inguinal nodes Resp: Lungs clear bilaterally Cardio: Regular rate and rhythm GI: No mass, no hepatosplenomegaly Vascular: No leg edema  Skin: Multiple moles over the trunk, right face scars and left upper back scar without evidence of recurrent tumor   Lab Results:  Lab Results  Component Value Date   WBC 6.9 08/21/2019   HGB 12.4 (L) 08/21/2019   HCT 38.3 (L) 08/21/2019   MCV 96.7 08/21/2019   PLT 172 08/21/2019   NEUTROABS 4.8 08/21/2019    CMP  Lab Results  Component Value Date   NA 139 08/21/2019   K 4.1 08/21/2019   CL 106 08/21/2019   CO2 22 08/21/2019   GLUCOSE 93 08/21/2019   BUN 31 (H) 08/21/2019   CREATININE 1.46 (H) 08/21/2019   CALCIUM 8.8 (L) 08/21/2019   PROT 6.5 08/21/2019   ALBUMIN 3.5 08/21/2019   AST 18 08/21/2019   ALT 19 08/21/2019   ALKPHOS 65 08/21/2019   BILITOT 0.7 08/21/2019   GFRNONAA 44 (L) 08/21/2019   GFRAA 52 (L) 08/21/2019    Medications: I have reviewed the patient's current medications.   Assessment/Plan: 1.Squamous cell carcinoma of the right cheek, status post Mohs surgery in 2004   2. Local recurrence of squamous cell carcinoma and a right face lymph node in 2006, status post surgical excision followed by radiation   3. Left hilar fullness on a chest x-ray 02/26/2012-stable compared to a chest  x-ray from February of 2012 and new compared to a chest x-ray 2007 . A chest CT on 05/05/2012 confirmed a left upper lung nodule consistent with a primary bronchogenic carcinoma. A PET scan on 05/18/12 confirmed a hypermetabolic left upper lung nodule with no evidence of thoracic nodal or extrathoracic hypermetabolic disease   -He is status post a left upper lobectomy on 06/16/2012 with the pathology confirming a 3.8 cm squamous cell carcinoma (T2a, N0)   -Restaging CT 06/16/2014 without evidence of recurrent disease   -Restaging CT 12/15/2014 without evidence of recurrent disease -Restaging CT 12/06/2015-no evidence of recurrent disease -Restaging chest CT 01/02/2017-no evidence of recurrent disease -Restaging chest CT 01/28/2018-no evidence of recurrent disease -Restaging chest CT 01/28/2019-no evidence of recurrent disease -Restaging chest CT 02/03/2020-no evidence of recurrent disease -Restaging CT 02/08/2021-no evidence of recurrent disease 4. History of BPH , status post a laser vaporization of the prostate in February 2014 and TUR August 2015   5. History of multiple skin cancers including basal cell carcinoma and squamous cell carcinomas   6. coronary artery disease, decreased left ventricular ejection , Status post coronary artery bypass surgery July 2017       Disposition:  [name] [name] remains in clinical remission from lung cancer.  He would like to continue clinical follow-up and CT surveillance.  He will be scheduled for a chest CT and office visit in February 2023.  He continues follow-up with Dr. Ronnald Ramp for management of multiple skin cancers.  Betsy Coder, MD  07/12/2021  9:34 AM

## 2021-07-16 DIAGNOSIS — N35914 Unspecified anterior urethral stricture, male: Secondary | ICD-10-CM | POA: Diagnosis not present

## 2021-07-16 DIAGNOSIS — D291 Benign neoplasm of prostate: Secondary | ICD-10-CM | POA: Diagnosis not present

## 2021-07-16 DIAGNOSIS — N393 Stress incontinence (female) (male): Secondary | ICD-10-CM | POA: Diagnosis not present

## 2021-07-16 DIAGNOSIS — N3289 Other specified disorders of bladder: Secondary | ICD-10-CM | POA: Diagnosis not present

## 2021-07-16 DIAGNOSIS — N35813 Other membranous urethral stricture, male: Secondary | ICD-10-CM | POA: Diagnosis not present

## 2021-07-16 DIAGNOSIS — N35916 Unspecified urethral stricture, male, overlapping sites: Secondary | ICD-10-CM | POA: Diagnosis not present

## 2021-08-09 DIAGNOSIS — I25118 Atherosclerotic heart disease of native coronary artery with other forms of angina pectoris: Secondary | ICD-10-CM | POA: Diagnosis not present

## 2021-08-09 DIAGNOSIS — I771 Stricture of artery: Secondary | ICD-10-CM | POA: Diagnosis not present

## 2021-08-09 DIAGNOSIS — I6523 Occlusion and stenosis of bilateral carotid arteries: Secondary | ICD-10-CM | POA: Diagnosis not present

## 2021-08-09 DIAGNOSIS — I5022 Chronic systolic (congestive) heart failure: Secondary | ICD-10-CM | POA: Diagnosis not present

## 2021-08-22 DIAGNOSIS — I5022 Chronic systolic (congestive) heart failure: Secondary | ICD-10-CM | POA: Diagnosis not present

## 2021-08-22 DIAGNOSIS — E782 Mixed hyperlipidemia: Secondary | ICD-10-CM | POA: Diagnosis not present

## 2021-08-22 DIAGNOSIS — I6523 Occlusion and stenosis of bilateral carotid arteries: Secondary | ICD-10-CM | POA: Diagnosis not present

## 2021-08-22 DIAGNOSIS — I2510 Atherosclerotic heart disease of native coronary artery without angina pectoris: Secondary | ICD-10-CM | POA: Diagnosis not present

## 2021-08-22 DIAGNOSIS — I10 Essential (primary) hypertension: Secondary | ICD-10-CM | POA: Diagnosis not present

## 2021-08-23 DIAGNOSIS — I10 Essential (primary) hypertension: Secondary | ICD-10-CM | POA: Diagnosis not present

## 2021-08-23 DIAGNOSIS — N200 Calculus of kidney: Secondary | ICD-10-CM | POA: Diagnosis not present

## 2021-08-23 DIAGNOSIS — Z01812 Encounter for preprocedural laboratory examination: Secondary | ICD-10-CM | POA: Diagnosis not present

## 2021-08-23 DIAGNOSIS — N393 Stress incontinence (female) (male): Secondary | ICD-10-CM | POA: Diagnosis not present

## 2021-09-07 DIAGNOSIS — N393 Stress incontinence (female) (male): Secondary | ICD-10-CM | POA: Diagnosis not present

## 2021-09-13 DIAGNOSIS — U071 COVID-19: Secondary | ICD-10-CM | POA: Diagnosis not present

## 2021-09-20 DIAGNOSIS — R8290 Unspecified abnormal findings in urine: Secondary | ICD-10-CM | POA: Diagnosis not present

## 2021-09-20 DIAGNOSIS — N35914 Unspecified anterior urethral stricture, male: Secondary | ICD-10-CM | POA: Diagnosis not present

## 2021-09-20 DIAGNOSIS — N393 Stress incontinence (female) (male): Secondary | ICD-10-CM | POA: Diagnosis not present

## 2021-09-25 ENCOUNTER — Other Ambulatory Visit: Payer: Self-pay | Admitting: Urology

## 2021-09-25 DIAGNOSIS — N209 Urinary calculus, unspecified: Secondary | ICD-10-CM

## 2021-10-15 DIAGNOSIS — N390 Urinary tract infection, site not specified: Secondary | ICD-10-CM | POA: Diagnosis not present

## 2021-10-15 DIAGNOSIS — Z01818 Encounter for other preprocedural examination: Secondary | ICD-10-CM | POA: Diagnosis not present

## 2021-10-17 ENCOUNTER — Inpatient Hospital Stay: Admission: RE | Admit: 2021-10-17 | Payer: PPO | Source: Ambulatory Visit

## 2021-10-17 ENCOUNTER — Ambulatory Visit
Admission: RE | Admit: 2021-10-17 | Discharge: 2021-10-17 | Disposition: A | Discharge status: Home / Self Care | Payer: PPO | Source: Ambulatory Visit | Attending: Urology | Admitting: Urology

## 2021-10-17 DIAGNOSIS — N209 Urinary calculus, unspecified: Secondary | ICD-10-CM

## 2021-10-17 DIAGNOSIS — N200 Calculus of kidney: Secondary | ICD-10-CM | POA: Diagnosis not present

## 2021-10-24 DIAGNOSIS — Z85828 Personal history of other malignant neoplasm of skin: Secondary | ICD-10-CM | POA: Diagnosis not present

## 2021-10-24 DIAGNOSIS — D2272 Melanocytic nevi of left lower limb, including hip: Secondary | ICD-10-CM | POA: Diagnosis not present

## 2021-10-24 DIAGNOSIS — L821 Other seborrheic keratosis: Secondary | ICD-10-CM | POA: Diagnosis not present

## 2021-10-24 DIAGNOSIS — D1801 Hemangioma of skin and subcutaneous tissue: Secondary | ICD-10-CM | POA: Diagnosis not present

## 2021-10-24 DIAGNOSIS — L570 Actinic keratosis: Secondary | ICD-10-CM | POA: Diagnosis not present

## 2021-10-24 DIAGNOSIS — L820 Inflamed seborrheic keratosis: Secondary | ICD-10-CM | POA: Diagnosis not present

## 2021-10-26 DIAGNOSIS — N3000 Acute cystitis without hematuria: Secondary | ICD-10-CM | POA: Diagnosis not present

## 2021-11-12 DIAGNOSIS — Z87891 Personal history of nicotine dependence: Secondary | ICD-10-CM | POA: Diagnosis not present

## 2021-11-12 DIAGNOSIS — I2510 Atherosclerotic heart disease of native coronary artery without angina pectoris: Secondary | ICD-10-CM | POA: Diagnosis not present

## 2021-11-12 DIAGNOSIS — I509 Heart failure, unspecified: Secondary | ICD-10-CM | POA: Diagnosis not present

## 2021-11-12 DIAGNOSIS — Z79899 Other long term (current) drug therapy: Secondary | ICD-10-CM | POA: Diagnosis not present

## 2021-11-12 DIAGNOSIS — Z85118 Personal history of other malignant neoplasm of bronchus and lung: Secondary | ICD-10-CM | POA: Diagnosis not present

## 2021-11-12 DIAGNOSIS — E785 Hyperlipidemia, unspecified: Secondary | ICD-10-CM | POA: Diagnosis not present

## 2021-11-12 DIAGNOSIS — Z8546 Personal history of malignant neoplasm of prostate: Secondary | ICD-10-CM | POA: Diagnosis not present

## 2021-11-12 DIAGNOSIS — N393 Stress incontinence (female) (male): Secondary | ICD-10-CM | POA: Diagnosis not present

## 2021-11-12 DIAGNOSIS — Z951 Presence of aortocoronary bypass graft: Secondary | ICD-10-CM | POA: Diagnosis not present

## 2021-11-12 DIAGNOSIS — I110 Hypertensive heart disease with heart failure: Secondary | ICD-10-CM | POA: Diagnosis not present

## 2021-11-12 DIAGNOSIS — R52 Pain, unspecified: Secondary | ICD-10-CM | POA: Diagnosis not present

## 2021-11-12 DIAGNOSIS — Z7982 Long term (current) use of aspirin: Secondary | ICD-10-CM | POA: Diagnosis not present

## 2021-12-05 DIAGNOSIS — I10 Essential (primary) hypertension: Secondary | ICD-10-CM | POA: Diagnosis not present

## 2021-12-05 DIAGNOSIS — N400 Enlarged prostate without lower urinary tract symptoms: Secondary | ICD-10-CM | POA: Diagnosis not present

## 2021-12-05 DIAGNOSIS — I255 Ischemic cardiomyopathy: Secondary | ICD-10-CM | POA: Diagnosis not present

## 2021-12-17 DIAGNOSIS — N401 Enlarged prostate with lower urinary tract symptoms: Secondary | ICD-10-CM | POA: Diagnosis not present

## 2021-12-17 DIAGNOSIS — N138 Other obstructive and reflux uropathy: Secondary | ICD-10-CM | POA: Diagnosis not present

## 2021-12-19 DIAGNOSIS — R051 Acute cough: Secondary | ICD-10-CM | POA: Diagnosis not present

## 2021-12-19 DIAGNOSIS — U071 COVID-19: Secondary | ICD-10-CM | POA: Diagnosis not present

## 2021-12-19 DIAGNOSIS — R509 Fever, unspecified: Secondary | ICD-10-CM | POA: Diagnosis not present

## 2021-12-24 ENCOUNTER — Encounter (HOSPITAL_BASED_OUTPATIENT_CLINIC_OR_DEPARTMENT_OTHER): Payer: Self-pay | Admitting: Obstetrics and Gynecology

## 2021-12-24 ENCOUNTER — Emergency Department (HOSPITAL_BASED_OUTPATIENT_CLINIC_OR_DEPARTMENT_OTHER)
Admission: EM | Admit: 2021-12-24 | Discharge: 2021-12-25 | Disposition: A | Discharge status: Other Acute Inpatient Hospital | Payer: PPO | Attending: Emergency Medicine | Admitting: Emergency Medicine

## 2021-12-24 ENCOUNTER — Other Ambulatory Visit: Payer: Self-pay

## 2021-12-24 DIAGNOSIS — I130 Hypertensive heart and chronic kidney disease with heart failure and stage 1 through stage 4 chronic kidney disease, or unspecified chronic kidney disease: Secondary | ICD-10-CM | POA: Insufficient documentation

## 2021-12-24 DIAGNOSIS — Y846 Urinary catheterization as the cause of abnormal reaction of the patient, or of later complication, without mention of misadventure at the time of the procedure: Secondary | ICD-10-CM | POA: Diagnosis not present

## 2021-12-24 DIAGNOSIS — N189 Chronic kidney disease, unspecified: Secondary | ICD-10-CM | POA: Insufficient documentation

## 2021-12-24 DIAGNOSIS — Z87891 Personal history of nicotine dependence: Secondary | ICD-10-CM | POA: Insufficient documentation

## 2021-12-24 DIAGNOSIS — Z85118 Personal history of other malignant neoplasm of bronchus and lung: Secondary | ICD-10-CM | POA: Insufficient documentation

## 2021-12-24 DIAGNOSIS — I5023 Acute on chronic systolic (congestive) heart failure: Secondary | ICD-10-CM | POA: Diagnosis not present

## 2021-12-24 DIAGNOSIS — T83111A Breakdown (mechanical) of urinary sphincter implant, initial encounter: Secondary | ICD-10-CM | POA: Diagnosis not present

## 2021-12-24 DIAGNOSIS — I2510 Atherosclerotic heart disease of native coronary artery without angina pectoris: Secondary | ICD-10-CM | POA: Diagnosis not present

## 2021-12-24 DIAGNOSIS — Z85828 Personal history of other malignant neoplasm of skin: Secondary | ICD-10-CM | POA: Insufficient documentation

## 2021-12-24 DIAGNOSIS — Z951 Presence of aortocoronary bypass graft: Secondary | ICD-10-CM | POA: Diagnosis not present

## 2021-12-24 NOTE — ED Notes (Signed)
Output: 439mL's urine

## 2021-12-24 NOTE — ED Triage Notes (Signed)
Patient reports to the ER for urinary retention. Patient reports he has not peed since this morning and has a valve put in. Patient reports he is supposed to be able to press and drain his bladder and it is not working. Patient reports pain in the region of his bladder and sensitivity in the scrotal region.

## 2021-12-24 NOTE — ED Notes (Signed)
Bladder scan after output: 838mL's

## 2021-12-24 NOTE — ED Provider Notes (Signed)
Emergency Department Provider Note   I have reviewed the triage vital signs and the nursing notes.   HISTORY  Chief Complaint Urinary Retention   HPI [name] [name] is a 85 y.o. male with PMH reviewed including AUS placement with Dr. Odis Luster at [facility] on 11/14, activated last Thursday, presents to the ED with urinary retention.  Patient states he is not urinated at all today other than dribbling urination at times.  He has been using his device and pump, located at the base of the right testicle as instructed.  He states its been somewhat difficult since activation but his pump has been functional.  Today, he has had very little to no urine and is having suprapubic pain.  No fever.  The patient's son states that he has been started on antibiotic with concern for urine infection in the recent past and has been taking that as instructed.  No flank pain, shaking chills, body aches at home.   Past Medical History:  Diagnosis Date   Arthritis    JOINT PAIN RIGHT HAND   BPH (benign prostatic hyperplasia)    [name]/elevated PSA, prostate biopsy x4 including one saturation biopsy, laser treatment 2/14; NOCTURIA   CAD (coronary artery disease) 2013   cath 05/2012 showing 80-90% stenosis of a trifurcating diagonal #1, 70-80% stenosis of OM3 and 90% stenosis of distal LCx after OM3 - medical management, [facility]   Cardiomyopathy ([place])    dilated cardiomyopathy EF 30%, MUGA  EF 42% 08/2012   Carotid artery occlusion    carotid artery bruit   Chronic kidney disease    kidney stones -small passed.   Diastolic dysfunction    Heart murmur    as a child   History of shingles    Hyperlipidemia    statin intolerant   Hypertension    Lung mass    Stage 1B non-small cell lung CA s/p resection 2013   PVC (premature ventricular contraction)    Shingles    Sigmoid diverticulosis    Skin cancer    Multiple skin cancers    Squamous cell carcinoma, face    history of right face with  mets to right upper cheek in 2004  and facial lymph node reoccurence post surgery with XRT   Trigger finger    Bilateral    Patient Active Problem List   Diagnosis Date Noted   Right rotator cuff tear arthropathy 05/13/2019   Numbness 04/21/2018   Bilateral carpal tunnel syndrome 04/21/2018   Left shoulder pain 03/25/2018   Arm weakness 03/25/2018   Cervical radiculopathy 03/25/2018   Cervical stenosis of spinal canal 03/25/2018   S/P CABG (coronary artery bypass graft)    Angina decubitus ([place]) 06/27/2016   Acute on chronic systolic CHF (congestive heart failure) ([place]) 10/30/2015   Benign hypertrophy of prostate 96/28/3662   Diastolic dysfunction    CAD (coronary artery disease) 10/09/2013   Ischemic dilated cardiomyopathy ([place]) 10/09/2013   Dyslipidemia 10/09/2013   HTN (hypertension) 10/09/2013   Asymptomatic PVCs 10/09/2013   Lung cancer, upper lobe (HCC)    Squamous cell carcinoma, face    Skin cancer    BPH (benign prostatic hyperplasia)    Shingles     Past Surgical History:  Procedure Laterality Date   APPENDECTOMY     APPENDECTOMY     CARDIAC CATHETERIZATION  05/2012   Has blockage- Treated with Medications.  Cardiologist - Dr Radford Pax   CARDIAC CATHETERIZATION Left 07/10/2016   Procedure: Left Heart Cath  and Coronary Angiography;  Surgeon: Corey Skains, MD;  Location: [place] [facility];  Service: Cardiovascular;  Laterality: Left;   CATARACT EXTRACTION, BILATERAL     CORONARY ARTERY BYPASS GRAFT N/A 07/19/2016   Procedure: CORONARY ARTERY BYPASS GRAFTING (CABG) x 4 (LIMA to LAD, SVG to DIAGONAL, SVG to CIRCUMFLEX, SVG to PDA) with EVH from [place] and LEFT INTERNAL MAMMARY ARTERY;  Surgeon: Grace Isaac, MD;  Location: [place];  Service: Open Heart Surgery;  Laterality: N/A;   excision ot metastatic lymph node followed by radiation  2006   .   GREEN LIGHT LASER TURP (TRANSURETHRAL RESECTION OF PROSTATE N/A 02/08/2013   Procedure: GREEN  LIGHT LASER TURP (TRANSURETHRAL RESECTION OF PROSTATE;  Surgeon: Ailene Rud, MD;  Location: [facility];  Service: Urology;  Laterality: N/A;   HOLMIUM LASER APPLICATION Left 04/07/8118   Procedure: HOLMIUM LASER APPLICATION;  Surgeon: Ailene Rud, MD;  Location: [facility];  Service: Urology;  Laterality: Left;   LYMPH NODE DISSECTION  06/16/2012   Procedure: LYMPH NODE DISSECTION;  Surgeon: Grace Isaac, MD;  Location: [place];  Service: Thoracic;;   MOHS SURGERY  2004   RIB PLATING Right 07/19/2016   Procedure: Right sternoclavicular joint plating;  Surgeon: Grace Isaac, MD;  Location: [place];  Service: Open Heart Surgery;  Laterality: Right;   TEE WITHOUT CARDIOVERSION N/A 07/19/2016   Procedure: TRANSESOPHAGEAL ECHOCARDIOGRAM (TEE);  Surgeon: Grace Isaac, MD;  Location: [place];  Service: Open Heart Surgery;  Laterality: N/A;   TRANSURETHRAL RESECTION OF PROSTATE Left 08/22/2014   Procedure: TRANSURETHRAL RESECTION OF THE PROSTATE (TURP) CYSTOSCOPY, LEFT RETROGRADE PYLEGRAM,LEFT with STENT,URETEROSCOPY,PLACEMENT OF BACKSTOP,LASER FRAGMENTATION WITH BASKET EXTRACTION LEFT URETEROSCOPY;  Surgeon: Ailene Rud, MD;  Location: [facility];  Service: Urology;  Laterality: Left;   VIDEO BRONCHOSCOPY  06/16/2012   Procedure: VIDEO BRONCHOSCOPY;  Surgeon: Grace Isaac, MD;  Location: [place];  Service: Thoracic;  Laterality: N/A;   VIDEO BRONCHOSCOPY  06/24/2012   Procedure: VIDEO BRONCHOSCOPY;  Surgeon: Grace Isaac, MD;  Location: [facility];  Service: Thoracic;  Laterality: N/A;    Allergies Ciprofloxacin, Ramipril, and Statins  Family History  Problem Relation Age of Onset   Diabetes Father    Psoriasis Child    Heart disease Child        resuscitated from cardiac arrest from Brugada's syndrome   Lung cancer Mother    Heart disease Mother     Social History Social History   Tobacco Use   Smoking status: Former    Types: Cigarettes    Quit  date: 12/30/1965    Years since quitting: 56.0   Smokeless tobacco: Never  Substance Use Topics   Alcohol use: Yes    Alcohol/week: 5.0 - 6.0 standard drinks    Types: 3 - 4 Glasses of wine, 2 Cans of beer per week    Comment: infrequently- "indulge heavly when I do"   Drug use: No    Review of Systems  Constitutional: No fever/chills Eyes: No visual changes. ENT: No sore throat. Cardiovascular: Denies chest pain. Respiratory: Denies shortness of breath. Gastrointestinal: Positive suprapubic abdominal pain.  No nausea, no vomiting.  No diarrhea.  No constipation. Genitourinary: Positive difficulty with urination today.  Musculoskeletal: Negative for back pain. Skin: Negative for rash. Neurological: Negative for headaches, focal weakness or numbness.  10-point ROS otherwise negative.  ____________________________________________   PHYSICAL EXAM:  VITAL SIGNS: ED Triage Vitals  Enc Vitals Group     BP 12/24/21 2258 (!) 146/101     Pulse Rate 12/24/21 2258 78     Resp 12/24/21 2258 (!) 24     Temp 12/24/21 2258 98.1 F (36.7 C)     Temp Source 12/24/21 2258 Oral     SpO2 12/24/21 2258 100 %     Weight 12/24/21 2257 153 lb (69.4 kg)     Height 12/24/21 2257 5\' 4"  (1.626 m)    Constitutional: Alert and oriented. Well appearing and in no acute distress. Eyes: Conjunctivae are normal.  Head: Atraumatic. Nose: No congestion/rhinnorhea. Mouth/Throat: Mucous membranes are moist.   Neck: No stridor.   Cardiovascular: Normal rate, regular rhythm. Good peripheral circulation. Grossly normal heart sounds.   Respiratory: Normal respiratory effort.  No retractions. Lungs CTAB. Gastrointestinal: Soft with mild suprapubic tenderness. No distention.  Musculoskeletal: No lower extremity tenderness nor edema. No gross deformities of extremities. Neurologic:  Normal speech and language. No gross focal neurologic deficits are appreciated.  Skin:  Skin is warm, dry and intact. No rash  noted.   ____________________________________________   PROCEDURES  Procedure(s) performed:   Procedures  None  ____________________________________________   INITIAL IMPRESSION / ASSESSMENT AND PLAN / ED COURSE  Pertinent labs & imaging results that were available during my care of the patient were reviewed by me and considered in my medical decision making (see chart for details).   Patient presents with urinary retention.  He has an AUS activated late last week.  Will speak with the patient's urologist at [facility] to advise on treating this obstruction.  Cannot open the valve to either pass urine or place a small Foley. No foley was attempted.   11:40 PM  Was able to manipulate the device/pump and patient was able to pass 425 ml of urine with multiple pumps of the device.  12:00 AM  Was able to get an additional 300 ml of urine out for a total of 725 ml. Patient feeling improved but the the valve does not appear to be working well. It feels firm and requires multiple presses in the area to open the device which then closes after maybe 20 seconds of steady urine. Called back transfer line. Patient Accepted as an ED-ED transfer. Dr. Dan Europe is the accepting MD.   ____________________________________________  FINAL CLINICAL IMPRESSION(S) / ED DIAGNOSES  Final diagnoses:  Malfunction of artificial urethral sphincter, initial encounter [facility])    Note:  This document was prepared using Dragon voice recognition software and may include unintentional dictation errors.  Nanda Quinton, MD, [facility] Emergency Medicine    Kairen Hallinan, [facility], MD 12/25/21 [phone]

## 2021-12-25 DIAGNOSIS — Z9689 Presence of other specified functional implants: Secondary | ICD-10-CM | POA: Diagnosis not present

## 2021-12-25 DIAGNOSIS — R339 Retention of urine, unspecified: Secondary | ICD-10-CM | POA: Diagnosis not present

## 2021-12-25 DIAGNOSIS — Z87440 Personal history of urinary (tract) infections: Secondary | ICD-10-CM | POA: Diagnosis not present

## 2021-12-25 NOTE — Discharge Instructions (Signed)
Please drive directly to the emergency department at [facility] in [place].  You are being sent over to that hospital as a transfer.  Please tell them that at check-in and they will have the appropriate specialist come and see you.

## 2022-01-11 DIAGNOSIS — R1031 Right lower quadrant pain: Secondary | ICD-10-CM | POA: Diagnosis not present

## 2022-01-11 DIAGNOSIS — N3289 Other specified disorders of bladder: Secondary | ICD-10-CM | POA: Diagnosis not present

## 2022-01-11 DIAGNOSIS — R339 Retention of urine, unspecified: Secondary | ICD-10-CM | POA: Diagnosis not present

## 2022-01-24 ENCOUNTER — Telehealth: Payer: Self-pay

## 2022-01-24 NOTE — Telephone Encounter (Signed)
TC from Pt stating that he would like to see Dr Benay Spice without having the Ct scan Pt stated that he is having some urinary issues and would rather not have the Ct scan at this time. Discussed with Dr Benay Spice who stated this would be ok. Pt informed Ct scan canceled.

## 2022-01-30 DIAGNOSIS — R1909 Other intra-abdominal and pelvic swelling, mass and lump: Secondary | ICD-10-CM | POA: Diagnosis not present

## 2022-01-31 DIAGNOSIS — N3289 Other specified disorders of bladder: Secondary | ICD-10-CM | POA: Diagnosis not present

## 2022-01-31 DIAGNOSIS — N3946 Mixed incontinence: Secondary | ICD-10-CM | POA: Diagnosis not present

## 2022-01-31 DIAGNOSIS — T8324XA Erosion of graft of urinary organ, initial encounter: Secondary | ICD-10-CM | POA: Diagnosis not present

## 2022-01-31 DIAGNOSIS — I509 Heart failure, unspecified: Secondary | ICD-10-CM | POA: Diagnosis not present

## 2022-01-31 DIAGNOSIS — N368 Other specified disorders of urethra: Secondary | ICD-10-CM | POA: Diagnosis not present

## 2022-01-31 DIAGNOSIS — N393 Stress incontinence (female) (male): Secondary | ICD-10-CM | POA: Diagnosis not present

## 2022-01-31 DIAGNOSIS — I110 Hypertensive heart disease with heart failure: Secondary | ICD-10-CM | POA: Diagnosis not present

## 2022-01-31 DIAGNOSIS — T8142XA Infection following a procedure, deep incisional surgical site, initial encounter: Secondary | ICD-10-CM | POA: Diagnosis not present

## 2022-01-31 DIAGNOSIS — Z951 Presence of aortocoronary bypass graft: Secondary | ICD-10-CM | POA: Diagnosis not present

## 2022-01-31 DIAGNOSIS — T83591A Infection and inflammatory reaction due to implanted urinary sphincter, initial encounter: Secondary | ICD-10-CM | POA: Diagnosis not present

## 2022-01-31 DIAGNOSIS — N401 Enlarged prostate with lower urinary tract symptoms: Secondary | ICD-10-CM | POA: Diagnosis not present

## 2022-01-31 DIAGNOSIS — I2510 Atherosclerotic heart disease of native coronary artery without angina pectoris: Secondary | ICD-10-CM | POA: Diagnosis not present

## 2022-01-31 DIAGNOSIS — E785 Hyperlipidemia, unspecified: Secondary | ICD-10-CM | POA: Diagnosis not present

## 2022-02-02 DIAGNOSIS — T83591A Infection and inflammatory reaction due to implanted urinary sphincter, initial encounter: Secondary | ICD-10-CM | POA: Diagnosis not present

## 2022-02-11 ENCOUNTER — Ambulatory Visit (HOSPITAL_BASED_OUTPATIENT_CLINIC_OR_DEPARTMENT_OTHER): Payer: PPO

## 2022-02-12 ENCOUNTER — Other Ambulatory Visit: Payer: Self-pay

## 2022-02-12 ENCOUNTER — Inpatient Hospital Stay: Payer: PPO | Attending: Oncology | Admitting: Oncology

## 2022-02-12 VITALS — BP 107/56 | HR 76 | Temp 98.7°F | Resp 18 | Ht 64.0 in | Wt 156.0 lb

## 2022-02-12 DIAGNOSIS — I2510 Atherosclerotic heart disease of native coronary artery without angina pectoris: Secondary | ICD-10-CM | POA: Insufficient documentation

## 2022-02-12 DIAGNOSIS — Z85828 Personal history of other malignant neoplasm of skin: Secondary | ICD-10-CM | POA: Diagnosis not present

## 2022-02-12 DIAGNOSIS — C3412 Malignant neoplasm of upper lobe, left bronchus or lung: Secondary | ICD-10-CM | POA: Diagnosis not present

## 2022-02-12 DIAGNOSIS — Z85118 Personal history of other malignant neoplasm of bronchus and lung: Secondary | ICD-10-CM | POA: Insufficient documentation

## 2022-02-12 NOTE — Progress Notes (Signed)
[name] OFFICE PROGRESS NOTE   Diagnosis: Lung cancer, skin cancer  INTERVAL HISTORY:   [name] [name] returns as scheduled.  He reports a good appetite.  He relates weight loss to drinking less beer.  He underwent placement of a urethral sphincter for incontinence and reports developing complications of this procedure.  He is being treated for an infection.  He has a suprapubic tube and Foley catheter in place.  He is scheduled to see Dr. Roxan Hockey next week.  Objective:  Vital signs in last 24 hours:  Blood pressure (!) 107/56, pulse 76, temperature 98.7 F (37.1 C), temperature source Oral, resp. rate 18, height 5\' 4"  (1.626 m), weight 156 lb (70.8 kg), SpO2 99 %.    HEENT: Neck without mass Lymphatics: No cervical, supraclavicular, axillary, or inguinal nodes Resp: Lungs clear bilaterally Cardio: Regular rate and rhythm GI: No hepatosplenomegaly, suprapubic tube in place, induration in the right groin and right side of the scrotum, Foley catheter in place Vascular: No leg edema  Skin: Multiple scars over the face without evidence of recurrent tumor   Lab Results:  Lab Results  Component Value Date   WBC 6.9 08/21/2019   HGB 12.4 (L) 08/21/2019   HCT 38.3 (L) 08/21/2019   MCV 96.7 08/21/2019   PLT 172 08/21/2019   NEUTROABS 4.8 08/21/2019    CMP  Lab Results  Component Value Date   NA 139 08/21/2019   K 4.1 08/21/2019   CL 106 08/21/2019   CO2 22 08/21/2019   GLUCOSE 93 08/21/2019   BUN 31 (H) 08/21/2019   CREATININE 1.46 (H) 08/21/2019   CALCIUM 8.8 (L) 08/21/2019   PROT 6.5 08/21/2019   ALBUMIN 3.5 08/21/2019   AST 18 08/21/2019   ALT 19 08/21/2019   ALKPHOS 65 08/21/2019   BILITOT 0.7 08/21/2019   GFRNONAA 44 (L) 08/21/2019   GFRAA 52 (L) 08/21/2019     Medications: I have reviewed the patient's current medications.   Assessment/Plan: 1.Squamous cell carcinoma of the right cheek, status post Mohs surgery in 2004   2. Local  recurrence of squamous cell carcinoma and a right face lymph node in 2006, status post surgical excision followed by radiation   3. Left hilar fullness on a chest x-ray 02/26/2012-stable compared to a chest x-ray from February of 2012 and new compared to a chest x-ray 2007 . A chest CT on 05/05/2012 confirmed a left upper lung nodule consistent with a primary bronchogenic carcinoma. A PET scan on 05/18/12 confirmed a hypermetabolic left upper lung nodule with no evidence of thoracic nodal or extrathoracic hypermetabolic disease   -He is status post a left upper lobectomy on 06/16/2012 with the pathology confirming a 3.8 cm squamous cell carcinoma (T2a, N0)   -Restaging CT 06/16/2014 without evidence of recurrent disease   -Restaging CT 12/15/2014 without evidence of recurrent disease -Restaging CT 12/06/2015-no evidence of recurrent disease -Restaging chest CT 01/02/2017-no evidence of recurrent disease -Restaging chest CT 01/28/2018-no evidence of recurrent disease -Restaging chest CT 01/28/2019-no evidence of recurrent disease -Restaging chest CT 02/03/2020-no evidence of recurrent disease -Restaging CT 02/08/2021-no evidence of recurrent disease -CT abdomen/pelvis at [facility] 01/30/2022-no suspicious lung nodules, prominent right inguinal node favored reactive, diffuse wall thickening of the bladder, 4. History of BPH , status post a laser vaporization of the prostate in February 2014 and TUR August 2015   5. History of multiple skin cancers including basal cell carcinoma and squamous cell carcinomas   6. coronary artery disease,  decreased left ventricular ejection , Status post coronary artery bypass surgery July 2017    Disposition: [name] [name] remains in clinical remission from the squamous cell carcinoma of the right cheek and non-small cell lung cancer.  He is followed by urology after placement of a urethral sphincter mechanism.  He is being treated for a local infection.  He would like to  continue follow-up at the cancer center.  He will return for an office visit and repeat chest CT in 1 year.  He continues follow-up with Dr. Ronnald Ramp.  He will see Dr. Roxan Hockey within the next week.  I do not feel he needs additional chest imaging at present.  Betsy Coder, MD  02/12/2022  10:29 AM

## 2022-02-19 ENCOUNTER — Other Ambulatory Visit: Payer: Self-pay | Admitting: *Deleted

## 2022-02-19 ENCOUNTER — Other Ambulatory Visit: Payer: Self-pay

## 2022-02-19 ENCOUNTER — Ambulatory Visit: Payer: PPO | Admitting: Thoracic Surgery (Cardiothoracic Vascular Surgery)

## 2022-02-21 DIAGNOSIS — I5022 Chronic systolic (congestive) heart failure: Secondary | ICD-10-CM | POA: Diagnosis not present

## 2022-02-21 DIAGNOSIS — E782 Mixed hyperlipidemia: Secondary | ICD-10-CM | POA: Diagnosis not present

## 2022-02-21 DIAGNOSIS — I2510 Atherosclerotic heart disease of native coronary artery without angina pectoris: Secondary | ICD-10-CM | POA: Diagnosis not present

## 2022-02-21 DIAGNOSIS — I6523 Occlusion and stenosis of bilateral carotid arteries: Secondary | ICD-10-CM | POA: Diagnosis not present

## 2022-02-21 DIAGNOSIS — I25118 Atherosclerotic heart disease of native coronary artery with other forms of angina pectoris: Secondary | ICD-10-CM | POA: Diagnosis not present

## 2022-02-21 DIAGNOSIS — I10 Essential (primary) hypertension: Secondary | ICD-10-CM | POA: Diagnosis not present

## 2022-02-22 DIAGNOSIS — N368 Other specified disorders of urethra: Secondary | ICD-10-CM | POA: Diagnosis not present

## 2022-02-22 DIAGNOSIS — Z9889 Other specified postprocedural states: Secondary | ICD-10-CM | POA: Diagnosis not present

## 2022-02-22 DIAGNOSIS — T8142XD Infection following a procedure, deep incisional surgical site, subsequent encounter: Secondary | ICD-10-CM | POA: Diagnosis not present

## 2022-03-19 DIAGNOSIS — N35919 Unspecified urethral stricture, male, unspecified site: Secondary | ICD-10-CM | POA: Diagnosis not present

## 2022-03-19 DIAGNOSIS — N35912 Unspecified bulbous urethral stricture, male: Secondary | ICD-10-CM | POA: Diagnosis not present

## 2022-03-19 DIAGNOSIS — N368 Other specified disorders of urethra: Secondary | ICD-10-CM | POA: Diagnosis not present

## 2022-03-19 DIAGNOSIS — N393 Stress incontinence (female) (male): Secondary | ICD-10-CM | POA: Diagnosis not present

## 2022-04-18 DIAGNOSIS — N35914 Unspecified anterior urethral stricture, male: Secondary | ICD-10-CM | POA: Diagnosis not present

## 2022-04-18 DIAGNOSIS — N200 Calculus of kidney: Secondary | ICD-10-CM | POA: Diagnosis not present

## 2022-04-18 DIAGNOSIS — N401 Enlarged prostate with lower urinary tract symptoms: Secondary | ICD-10-CM | POA: Diagnosis not present

## 2022-04-18 DIAGNOSIS — N138 Other obstructive and reflux uropathy: Secondary | ICD-10-CM | POA: Diagnosis not present

## 2022-04-29 DIAGNOSIS — L570 Actinic keratosis: Secondary | ICD-10-CM | POA: Diagnosis not present

## 2022-04-29 DIAGNOSIS — D2271 Melanocytic nevi of right lower limb, including hip: Secondary | ICD-10-CM | POA: Diagnosis not present

## 2022-04-29 DIAGNOSIS — L820 Inflamed seborrheic keratosis: Secondary | ICD-10-CM | POA: Diagnosis not present

## 2022-04-29 DIAGNOSIS — L821 Other seborrheic keratosis: Secondary | ICD-10-CM | POA: Diagnosis not present

## 2022-04-29 DIAGNOSIS — C44619 Basal cell carcinoma of skin of left upper limb, including shoulder: Secondary | ICD-10-CM | POA: Diagnosis not present

## 2022-04-29 DIAGNOSIS — Z85828 Personal history of other malignant neoplasm of skin: Secondary | ICD-10-CM | POA: Diagnosis not present

## 2022-04-29 DIAGNOSIS — D485 Neoplasm of uncertain behavior of skin: Secondary | ICD-10-CM | POA: Diagnosis not present

## 2022-04-29 DIAGNOSIS — L814 Other melanin hyperpigmentation: Secondary | ICD-10-CM | POA: Diagnosis not present

## 2022-05-10 DIAGNOSIS — N35013 Post-traumatic anterior urethral stricture: Secondary | ICD-10-CM | POA: Diagnosis not present

## 2022-05-10 DIAGNOSIS — N401 Enlarged prostate with lower urinary tract symptoms: Secondary | ICD-10-CM | POA: Diagnosis not present

## 2022-05-10 DIAGNOSIS — I861 Scrotal varices: Secondary | ICD-10-CM | POA: Diagnosis not present

## 2022-05-10 DIAGNOSIS — N138 Other obstructive and reflux uropathy: Secondary | ICD-10-CM | POA: Diagnosis not present

## 2022-05-10 DIAGNOSIS — N492 Inflammatory disorders of scrotum: Secondary | ICD-10-CM | POA: Diagnosis not present

## 2022-05-10 DIAGNOSIS — N3946 Mixed incontinence: Secondary | ICD-10-CM | POA: Diagnosis not present

## 2022-05-10 DIAGNOSIS — R338 Other retention of urine: Secondary | ICD-10-CM | POA: Diagnosis not present

## 2022-05-17 DIAGNOSIS — N492 Inflammatory disorders of scrotum: Secondary | ICD-10-CM | POA: Diagnosis not present

## 2022-05-17 DIAGNOSIS — Z48816 Encounter for surgical aftercare following surgery on the genitourinary system: Secondary | ICD-10-CM | POA: Diagnosis not present

## 2022-06-05 DIAGNOSIS — N400 Enlarged prostate without lower urinary tract symptoms: Secondary | ICD-10-CM | POA: Diagnosis not present

## 2022-06-05 DIAGNOSIS — Z0000 Encounter for general adult medical examination without abnormal findings: Secondary | ICD-10-CM | POA: Diagnosis not present

## 2022-06-05 DIAGNOSIS — I10 Essential (primary) hypertension: Secondary | ICD-10-CM | POA: Diagnosis not present

## 2022-06-05 DIAGNOSIS — R7301 Impaired fasting glucose: Secondary | ICD-10-CM | POA: Diagnosis not present

## 2022-06-05 DIAGNOSIS — I25810 Atherosclerosis of coronary artery bypass graft(s) without angina pectoris: Secondary | ICD-10-CM | POA: Diagnosis not present

## 2022-06-05 DIAGNOSIS — I255 Ischemic cardiomyopathy: Secondary | ICD-10-CM | POA: Diagnosis not present

## 2022-06-05 DIAGNOSIS — D649 Anemia, unspecified: Secondary | ICD-10-CM | POA: Diagnosis not present

## 2022-06-05 DIAGNOSIS — T466X5A Adverse effect of antihyperlipidemic and antiarteriosclerotic drugs, initial encounter: Secondary | ICD-10-CM | POA: Diagnosis not present

## 2022-06-05 DIAGNOSIS — I480 Paroxysmal atrial fibrillation: Secondary | ICD-10-CM | POA: Diagnosis not present

## 2022-06-05 DIAGNOSIS — E7800 Pure hypercholesterolemia, unspecified: Secondary | ICD-10-CM | POA: Diagnosis not present

## 2022-06-05 DIAGNOSIS — I420 Dilated cardiomyopathy: Secondary | ICD-10-CM | POA: Diagnosis not present

## 2022-06-05 DIAGNOSIS — G720 Drug-induced myopathy: Secondary | ICD-10-CM | POA: Diagnosis not present

## 2022-06-05 DIAGNOSIS — Z1331 Encounter for screening for depression: Secondary | ICD-10-CM | POA: Diagnosis not present

## 2022-06-05 DIAGNOSIS — Z85118 Personal history of other malignant neoplasm of bronchus and lung: Secondary | ICD-10-CM | POA: Diagnosis not present

## 2022-06-05 DIAGNOSIS — I700 Atherosclerosis of aorta: Secondary | ICD-10-CM | POA: Diagnosis not present

## 2022-06-20 DIAGNOSIS — R338 Other retention of urine: Secondary | ICD-10-CM | POA: Diagnosis not present

## 2022-06-20 DIAGNOSIS — N138 Other obstructive and reflux uropathy: Secondary | ICD-10-CM | POA: Diagnosis not present

## 2022-06-20 DIAGNOSIS — N401 Enlarged prostate with lower urinary tract symptoms: Secondary | ICD-10-CM | POA: Diagnosis not present

## 2022-06-20 DIAGNOSIS — N393 Stress incontinence (female) (male): Secondary | ICD-10-CM | POA: Diagnosis not present

## 2022-06-20 DIAGNOSIS — N35814 Other anterior urethral stricture, male: Secondary | ICD-10-CM | POA: Diagnosis not present

## 2022-07-04 ENCOUNTER — Other Ambulatory Visit: Payer: Self-pay | Admitting: Thoracic Surgery (Cardiothoracic Vascular Surgery)

## 2022-07-04 DIAGNOSIS — C3412 Malignant neoplasm of upper lobe, left bronchus or lung: Secondary | ICD-10-CM

## 2022-07-18 DIAGNOSIS — N35914 Unspecified anterior urethral stricture, male: Secondary | ICD-10-CM | POA: Diagnosis not present

## 2022-07-18 DIAGNOSIS — Z87442 Personal history of urinary calculi: Secondary | ICD-10-CM | POA: Diagnosis not present

## 2022-07-18 DIAGNOSIS — N35912 Unspecified bulbous urethral stricture, male: Secondary | ICD-10-CM | POA: Diagnosis not present

## 2022-07-18 DIAGNOSIS — R339 Retention of urine, unspecified: Secondary | ICD-10-CM | POA: Diagnosis not present

## 2022-07-18 DIAGNOSIS — N393 Stress incontinence (female) (male): Secondary | ICD-10-CM | POA: Diagnosis not present

## 2022-07-18 DIAGNOSIS — N401 Enlarged prostate with lower urinary tract symptoms: Secondary | ICD-10-CM | POA: Diagnosis not present

## 2022-07-18 DIAGNOSIS — N35919 Unspecified urethral stricture, male, unspecified site: Secondary | ICD-10-CM | POA: Diagnosis not present

## 2022-07-18 DIAGNOSIS — N529 Male erectile dysfunction, unspecified: Secondary | ICD-10-CM | POA: Diagnosis not present

## 2022-07-18 DIAGNOSIS — N138 Other obstructive and reflux uropathy: Secondary | ICD-10-CM | POA: Diagnosis not present

## 2022-07-24 DIAGNOSIS — E611 Iron deficiency: Secondary | ICD-10-CM | POA: Diagnosis not present

## 2022-08-09 DIAGNOSIS — R339 Retention of urine, unspecified: Secondary | ICD-10-CM | POA: Diagnosis not present

## 2022-08-15 ENCOUNTER — Ambulatory Visit
Admission: RE | Admit: 2022-08-15 | Discharge: 2022-08-15 | Disposition: A | Discharge status: Home / Self Care | Payer: PPO | Source: Ambulatory Visit | Attending: Thoracic Surgery (Cardiothoracic Vascular Surgery) | Admitting: Thoracic Surgery (Cardiothoracic Vascular Surgery)

## 2022-08-15 DIAGNOSIS — I700 Atherosclerosis of aorta: Secondary | ICD-10-CM | POA: Diagnosis not present

## 2022-08-15 DIAGNOSIS — C3490 Malignant neoplasm of unspecified part of unspecified bronchus or lung: Secondary | ICD-10-CM | POA: Diagnosis not present

## 2022-08-15 DIAGNOSIS — C3412 Malignant neoplasm of upper lobe, left bronchus or lung: Secondary | ICD-10-CM

## 2022-08-20 ENCOUNTER — Ambulatory Visit: Payer: PPO | Admitting: Thoracic Surgery (Cardiothoracic Vascular Surgery)

## 2022-08-20 ENCOUNTER — Encounter: Payer: Self-pay | Admitting: Thoracic Surgery (Cardiothoracic Vascular Surgery)

## 2022-08-20 VITALS — BP 97/54 | HR 62 | Resp 18 | Ht 64.0 in | Wt 148.1 lb

## 2022-08-20 DIAGNOSIS — Z85118 Personal history of other malignant neoplasm of bronchus and lung: Secondary | ICD-10-CM | POA: Diagnosis not present

## 2022-08-20 DIAGNOSIS — Z08 Encounter for follow-up examination after completed treatment for malignant neoplasm: Secondary | ICD-10-CM | POA: Diagnosis not present

## 2022-08-20 NOTE — Progress Notes (Signed)
[address]       [place] [address]             [phone]     HPI: [name] returns for follow-up due to his history of lung cancer and CABG.  [name] [name] is an 86 year old man with a history of left upper lobectomy for stage Ib non-small cell carcinoma of the lung in 2013.  And coronary bypass grafting for three-vessel disease with ischemic cardiomyopathy in 2017.  Both operations were by Dr. Servando Snare.  He has been followed annually with a CT scan by Dr. Servando Snare.  He is also followed by Dr. Benay Spice of oncology and Dr. Nehemiah Massed is his cardiologist.  He has been feeling well.  He does get some shortness of breath with heavy exertion but is not having any chest pain, pressure, or tightness.  No change in appetite or significant weight loss.  Past Medical History:  Diagnosis Date   Arthritis    JOINT PAIN RIGHT HAND   BPH (benign prostatic hyperplasia)    [name]/elevated PSA, prostate biopsy x4 including one saturation biopsy, laser treatment 2/14; NOCTURIA   CAD (coronary artery disease) 2013   cath 05/2012 showing 80-90% stenosis of a trifurcating diagonal #1, 70-80% stenosis of OM3 and 90% stenosis of distal LCx after OM3 - medical management, [facility]   Cardiomyopathy ([place])    dilated cardiomyopathy EF 30%, MUGA  EF 42% 08/2012   Carotid artery occlusion    carotid artery bruit   Chronic kidney disease    kidney stones -small passed.   Diastolic dysfunction    Heart murmur    as a child   History of shingles    Hyperlipidemia    statin intolerant   Hypertension    Lung mass    Stage 1B non-small cell lung CA s/p resection 2013   PVC (premature ventricular contraction)    Shingles    Sigmoid diverticulosis    Skin cancer    Multiple skin cancers    Squamous cell carcinoma, face    history of right face with mets to right upper cheek in 2004  and facial lymph node reoccurence post surgery with XRT   Trigger finger    Bilateral     Current Outpatient Medications  Medication Sig Dispense Refill   amoxicillin-clavulanate (AUGMENTIN) 875-125 MG tablet Take 1 tablet by mouth 2 (two) times daily.     Ascorbic Acid (VITAMIN C) 1000 MG tablet Take 1,000 mg by mouth daily.     aspirin 81 MG tablet Take 81 mg by mouth daily.     carvedilol (COREG) 6.25 MG tablet TAKE 1 TABLET (6.25 MG TOTAL) BY MOUTH 2 (TWO) TIMES DAILY. NEEDS OFFICE VISIT [id number] 60 tablet 0   ezetimibe (ZETIA) 10 MG tablet Take 10 mg by mouth daily.     ferrous gluconate (FERGON) 324 MG tablet Take by mouth.     Flaxseed, Linseed, (FLAXSEED OIL PO) Take 1,400 mg by mouth daily.      GARLIC PO Take 3,976 mg by mouth 2 (two) times daily.      Multiple Vitamin (MULTIVITAMIN WITH MINERALS) TABS tablet Take 1 tablet by mouth daily.     Omega-3 Fatty Acids (FISH OIL) 1000 MG CAPS Take 1,000 mg by mouth in the morning and at bedtime.     Red Yeast Rice Extract 600 MG CAPS Take 1 capsule by mouth 2 (two) times daily.     spironolactone (ALDACTONE) 25 MG  tablet Take 1 tablet (25 mg total) by mouth daily. Call patient for office visit [id number] 90 tablet 0   VITAMIN D PO Take by mouth.     ZINC OXIDE PO Take 500 mg by mouth daily.     losartan (COZAAR) 25 MG tablet TAKE 1 TABLET (25 MG TOTAL) BY MOUTH AT BEDTIME. 90 tablet 3   No current facility-administered medications for this visit.    Physical Exam BP (!) 97/54 (BP Location: Right Arm, Patient Position: Sitting, Cuff Size: Normal)   Pulse 62   Resp 18   Ht 5\' 4"  (1.626 m)   Wt 148 lb 1.9 oz (67.2 kg)   SpO2 98% Comment: RA  BMI 25.69 kg/m  86 year old man in no acute distress Alert and oriented x3 with no focal deficits Lungs diminished at left base otherwise clear No cervical or supraclavicular adenopathy, no carotid bruit Cardiac regular, positive S4, no murmur  Diagnostic Tests: CT CHEST WITHOUT CONTRAST   TECHNIQUE: Multidetector CT imaging of the chest was performed following  the standard protocol without IV contrast.   RADIATION DOSE REDUCTION: This exam was performed according to the departmental dose-optimization program which includes automated exposure control, adjustment of the mA and/or kV according to patient size and/or use of iterative reconstruction technique.   COMPARISON:  CT chest February 08, 2021.   FINDINGS: Cardiovascular: No significant vascular findings. Normal heart size. No pericardial effusion. CABG. Coronary artery and aortic calcific atherosclerosis.   Mediastinum/Nodes: No enlarged mediastinal or axillary lymph nodes on this noncontrast study. Trachea, and esophagus demonstrate no significant findings. Absent right thyroid gland with subcentimeter left thyroid nodules.   Lungs/Pleura: Unchanged scarring in the left lung. No consolidation. No suspicious new pulmonary nodules or mass. No pleural effusion or pneumothorax.   Upper Abdomen: No acute abnormality.   Musculoskeletal: Median sternotomy. Multilevel degenerative change in the thoracic spine. No acute findings.   IMPRESSION: 1. Stable exam. Status post left upper lobectomy without evidence of recurrent/metastatic disease. 2. Aortic Atherosclerosis (ICD10-I70.0).     Electronically Signed   By: Margaretha Sheffield M.D.   On: 08/15/2022 12:18 I personally reviewed the CT images.  No evidence of recurrent disease.  Marked cardiomegaly and evidence of coronary and aortic atherosclerosis.  Impression: [name] [name] is an 86 year old man with a history of left upper lobectomy for stage Ib non-small cell carcinoma of the lung in 2013.  And coronary bypass grafting for three-vessel disease with ischemic cardiomyopathy in 2017.  Both operations were by Dr. Servando Snare.  Stage Ib non-small cell carcinoma-status post left upper lobectomy in 2013.  He is now 10 years out from surgery with no evidence of recurrent disease.  Minimal remote smoking history and quit over 50 years ago.   Does not meet criteria for lung cancer screening but would like to continue follow-up.  Will return in 1 year with a low-dose protocol CT.  CAD with ischemic cardiomyopathy-status post CABG in 2017.  No anginal symptoms.  No significant heart failure symptoms currently.  Followed by Dr. Nehemiah Massed.  Plan: Return in 1 year with CT chest  Melrose Nakayama, MD [facility] [phone]

## 2022-08-29 DIAGNOSIS — R339 Retention of urine, unspecified: Secondary | ICD-10-CM | POA: Diagnosis not present

## 2022-08-29 DIAGNOSIS — Z87438 Personal history of other diseases of male genital organs: Secondary | ICD-10-CM | POA: Diagnosis not present

## 2022-08-29 DIAGNOSIS — N35919 Unspecified urethral stricture, male, unspecified site: Secondary | ICD-10-CM | POA: Diagnosis not present

## 2022-08-29 DIAGNOSIS — Z466 Encounter for fitting and adjustment of urinary device: Secondary | ICD-10-CM | POA: Diagnosis not present

## 2022-08-29 DIAGNOSIS — Z9359 Other cystostomy status: Secondary | ICD-10-CM | POA: Diagnosis not present

## 2022-08-29 DIAGNOSIS — N393 Stress incontinence (female) (male): Secondary | ICD-10-CM | POA: Diagnosis not present

## 2022-08-29 DIAGNOSIS — Z87442 Personal history of urinary calculi: Secondary | ICD-10-CM | POA: Diagnosis not present

## 2022-09-16 DIAGNOSIS — R339 Retention of urine, unspecified: Secondary | ICD-10-CM | POA: Diagnosis not present

## 2022-10-23 DIAGNOSIS — R339 Retention of urine, unspecified: Secondary | ICD-10-CM | POA: Diagnosis not present

## 2022-10-23 DIAGNOSIS — Z466 Encounter for fitting and adjustment of urinary device: Secondary | ICD-10-CM | POA: Diagnosis not present

## 2022-11-14 DIAGNOSIS — D485 Neoplasm of uncertain behavior of skin: Secondary | ICD-10-CM | POA: Diagnosis not present

## 2022-11-14 DIAGNOSIS — C4442 Squamous cell carcinoma of skin of scalp and neck: Secondary | ICD-10-CM | POA: Diagnosis not present

## 2022-11-14 DIAGNOSIS — D0439 Carcinoma in situ of skin of other parts of face: Secondary | ICD-10-CM | POA: Diagnosis not present

## 2022-11-14 DIAGNOSIS — L570 Actinic keratosis: Secondary | ICD-10-CM | POA: Diagnosis not present

## 2022-11-14 DIAGNOSIS — Z85828 Personal history of other malignant neoplasm of skin: Secondary | ICD-10-CM | POA: Diagnosis not present

## 2022-11-14 DIAGNOSIS — L309 Dermatitis, unspecified: Secondary | ICD-10-CM | POA: Diagnosis not present

## 2022-11-14 DIAGNOSIS — L821 Other seborrheic keratosis: Secondary | ICD-10-CM | POA: Diagnosis not present

## 2022-11-14 DIAGNOSIS — L814 Other melanin hyperpigmentation: Secondary | ICD-10-CM | POA: Diagnosis not present

## 2022-11-27 DIAGNOSIS — R82998 Other abnormal findings in urine: Secondary | ICD-10-CM | POA: Diagnosis not present

## 2022-11-27 DIAGNOSIS — R800 Isolated proteinuria: Secondary | ICD-10-CM | POA: Diagnosis not present

## 2022-11-27 DIAGNOSIS — R310 Gross hematuria: Secondary | ICD-10-CM | POA: Diagnosis not present

## 2022-11-27 DIAGNOSIS — R338 Other retention of urine: Secondary | ICD-10-CM | POA: Diagnosis not present

## 2022-11-27 DIAGNOSIS — Z435 Encounter for attention to cystostomy: Secondary | ICD-10-CM | POA: Diagnosis not present

## 2022-11-27 DIAGNOSIS — Z9359 Other cystostomy status: Secondary | ICD-10-CM | POA: Diagnosis not present

## 2022-12-12 DIAGNOSIS — R739 Hyperglycemia, unspecified: Secondary | ICD-10-CM | POA: Diagnosis not present

## 2022-12-12 DIAGNOSIS — I25810 Atherosclerosis of coronary artery bypass graft(s) without angina pectoris: Secondary | ICD-10-CM | POA: Diagnosis not present

## 2022-12-12 DIAGNOSIS — I255 Ischemic cardiomyopathy: Secondary | ICD-10-CM | POA: Diagnosis not present

## 2022-12-12 DIAGNOSIS — Z789 Other specified health status: Secondary | ICD-10-CM | POA: Diagnosis not present

## 2022-12-12 DIAGNOSIS — C44320 Squamous cell carcinoma of skin of unspecified parts of face: Secondary | ICD-10-CM | POA: Diagnosis not present

## 2022-12-12 DIAGNOSIS — Z85118 Personal history of other malignant neoplasm of bronchus and lung: Secondary | ICD-10-CM | POA: Diagnosis not present

## 2022-12-12 DIAGNOSIS — E7800 Pure hypercholesterolemia, unspecified: Secondary | ICD-10-CM | POA: Diagnosis not present

## 2022-12-25 DIAGNOSIS — N35914 Unspecified anterior urethral stricture, male: Secondary | ICD-10-CM | POA: Diagnosis not present

## 2022-12-25 DIAGNOSIS — N492 Inflammatory disorders of scrotum: Secondary | ICD-10-CM | POA: Diagnosis not present

## 2022-12-25 DIAGNOSIS — Z9359 Other cystostomy status: Secondary | ICD-10-CM | POA: Diagnosis not present

## 2022-12-25 DIAGNOSIS — R339 Retention of urine, unspecified: Secondary | ICD-10-CM | POA: Diagnosis not present

## 2023-01-04 ENCOUNTER — Emergency Department (HOSPITAL_BASED_OUTPATIENT_CLINIC_OR_DEPARTMENT_OTHER)
Admission: EM | Admit: 2023-01-04 | Discharge: 2023-01-04 | Disposition: A | Discharge status: Home / Self Care | Payer: PPO | Attending: Emergency Medicine | Admitting: Emergency Medicine

## 2023-01-04 ENCOUNTER — Encounter (HOSPITAL_BASED_OUTPATIENT_CLINIC_OR_DEPARTMENT_OTHER): Payer: Self-pay | Admitting: Emergency Medicine

## 2023-01-04 ENCOUNTER — Other Ambulatory Visit: Payer: Self-pay

## 2023-01-04 DIAGNOSIS — I129 Hypertensive chronic kidney disease with stage 1 through stage 4 chronic kidney disease, or unspecified chronic kidney disease: Secondary | ICD-10-CM | POA: Diagnosis not present

## 2023-01-04 DIAGNOSIS — Z7982 Long term (current) use of aspirin: Secondary | ICD-10-CM | POA: Diagnosis not present

## 2023-01-04 DIAGNOSIS — N189 Chronic kidney disease, unspecified: Secondary | ICD-10-CM | POA: Insufficient documentation

## 2023-01-04 DIAGNOSIS — T83091A Other mechanical complication of indwelling urethral catheter, initial encounter: Secondary | ICD-10-CM | POA: Diagnosis not present

## 2023-01-04 DIAGNOSIS — Z85828 Personal history of other malignant neoplasm of skin: Secondary | ICD-10-CM | POA: Diagnosis not present

## 2023-01-04 DIAGNOSIS — Z85118 Personal history of other malignant neoplasm of bronchus and lung: Secondary | ICD-10-CM | POA: Diagnosis not present

## 2023-01-04 DIAGNOSIS — T83090A Other mechanical complication of cystostomy catheter, initial encounter: Secondary | ICD-10-CM

## 2023-01-04 DIAGNOSIS — Z79899 Other long term (current) drug therapy: Secondary | ICD-10-CM | POA: Diagnosis not present

## 2023-01-04 DIAGNOSIS — I2510 Atherosclerotic heart disease of native coronary artery without angina pectoris: Secondary | ICD-10-CM | POA: Insufficient documentation

## 2023-01-04 DIAGNOSIS — T83098A Other mechanical complication of other indwelling urethral catheter, initial encounter: Secondary | ICD-10-CM | POA: Insufficient documentation

## 2023-01-04 DIAGNOSIS — T83198A Other mechanical complication of other urinary devices and implants, initial encounter: Secondary | ICD-10-CM | POA: Diagnosis not present

## 2023-01-04 LAB — URINALYSIS, ROUTINE W REFLEX MICROSCOPIC
Bilirubin Urine: NEGATIVE
Glucose, UA: NEGATIVE mg/dL
Ketones, ur: NEGATIVE mg/dL
Nitrite: NEGATIVE
Protein, ur: NEGATIVE mg/dL
Specific Gravity, Urine: 1.015 (ref 1.005–1.030)
pH: 6 (ref 5.0–8.0)

## 2023-01-04 LAB — URINALYSIS, MICROSCOPIC (REFLEX)
RBC / HPF: >50 RBC/hpf (ref 0–5)
WBC, UA: >50 WBC/hpf (ref 0–5)

## 2023-01-04 NOTE — ED Provider Notes (Signed)
[facility] EMERGENCY DEPARTMENT Provider Note   CSN: [id number] Arrival date & time: 01/04/23  4944     History  Chief Complaint  Patient presents with   Suprapubic tube blockage    [name] [name] is a 87 y.o. male.  HPI     87 year old male with a history of cardiomyopathy, coronary artery disease, CKD, hypertension, hyperlipidemia, non-small cell lung cancer with resection in 2013, BPH with 4 negative prostate biopsies by report who underwent greenlight laser ablation by Dr. Gaynelle Arabian, TURP in 2015, urethral stricture with SP tube emergently placed by IR 07/18/2022 with preference to keep SP tube rather than urethral reconstruction, SP replacement last exchanged 12/25/2022 presents with concern for obstructed suprapubic catheter.  Reports changed the bag last night and has not had new urine in it, has increasing suprapubic abdominal pain and pressure and some nausea as pressure worsened. Does not think it was draining normally since 10AM yesterday.  Did not see blood or blooc clots. No prior problems with catheter.  Receiving bactrim for scrotal infection. Denies other symptoms prior to obstruction such as no preceding abdominal pain, nausea, vomiting, fever, flank pain.   They called urology regarding catheter not draining, told to try irrigation but did not have any training or supplies Went to urgent care and told to come here.    Past Medical History:  Diagnosis Date   Arthritis    JOINT PAIN RIGHT HAND   BPH (benign prostatic hyperplasia)    [name]/elevated PSA, prostate biopsy x4 including one saturation biopsy, laser treatment 2/14; NOCTURIA   CAD (coronary artery disease) 2013   cath 05/2012 showing 80-90% stenosis of a trifurcating diagonal #1, 70-80% stenosis of OM3 and 90% stenosis of distal LCx after OM3 - medical management, [place]   Cardiomyopathy ([place])    dilated cardiomyopathy EF 30%, MUGA  EF 42% 08/2012   Carotid artery occlusion    carotid  artery bruit   Chronic kidney disease    kidney stones -small passed.   Diastolic dysfunction    Heart murmur    as a child   History of shingles    Hyperlipidemia    statin intolerant   Hypertension    Lung mass    Stage 1B non-small cell lung CA s/p resection 2013   PVC (premature ventricular contraction)    Shingles    Sigmoid diverticulosis    Skin cancer    Multiple skin cancers    Squamous cell carcinoma, face    history of right face with mets to right upper cheek in 2004  and facial lymph node reoccurence post surgery with XRT   Trigger finger    Bilateral    Home Medications Prior to Admission medications   Medication Sig Start Date End Date Taking? Authorizing Provider  amoxicillin-clavulanate (AUGMENTIN) 875-125 MG tablet Take 1 tablet by mouth 2 (two) times daily. 02/03/22   [provider]  Ascorbic Acid (VITAMIN C) 1000 MG tablet Take 1,000 mg by mouth daily.    [provider]  aspirin 81 MG tablet Take 81 mg by mouth daily.    [provider]  carvedilol (COREG) 6.25 MG tablet TAKE 1 TABLET (6.25 MG TOTAL) BY MOUTH 2 (TWO) TIMES DAILY. NEEDS OFFICE VISIT [phone] 09/14/18   Bensimhon, Shaune Pascal, MD  ezetimibe (ZETIA) 10 MG tablet Take 10 mg by mouth daily.    [provider]  ferrous gluconate (FERGON) 324 MG tablet Take by mouth.    [provider]  Flaxseed, Linseed, (FLAXSEED OIL PO) Take 1,400 mg by mouth daily.     [provider]  GARLIC PO Take 0,160 mg by mouth 2 (two) times daily.     [provider]  losartan (COZAAR) 25 MG tablet TAKE 1 TABLET (25 MG TOTAL) BY MOUTH AT BEDTIME. 10/28/17 06/16/20  Larey Dresser, MD  Multiple Vitamin (MULTIVITAMIN WITH MINERALS) TABS tablet Take 1 tablet by mouth daily.    [provider]  Omega-3 Fatty Acids (FISH OIL) 1000 MG CAPS Take 1,000 mg by mouth in the morning and at bedtime.    [provider]  Red Yeast Rice Extract 600 MG CAPS  Take 1 capsule by mouth 2 (two) times daily.    [provider]  spironolactone (ALDACTONE) 25 MG tablet Take 1 tablet (25 mg total) by mouth daily. Call patient for office visit [phone] 05/26/18   Larey Dresser, MD  sulfamethoxazole-trimethoprim (BACTRIM DS) 800-160 MG tablet Take by mouth. 12/25/22 01/04/23  [provider]  VITAMIN D PO Take by mouth.    [provider]  ZINC OXIDE PO Take 500 mg by mouth daily.    [provider]      Allergies    Ciprofloxacin, Crestor [rosuvastatin], Ramipril, and Statins    Review of Systems   Review of Systems  Physical Exam Updated Vital Signs BP (!) 164/69 (BP Location: Right Arm)   Pulse 70   Temp 98 F (36.7 C) (Oral)   Resp 16   Ht 5\' 5"  (1.651 m)   Wt 65.8 kg   SpO2 100%   BMI 24.13 kg/m  Physical Exam Vitals and nursing note reviewed.  Constitutional:      General: He is not in acute distress.    Appearance: Normal appearance. He is not ill-appearing, toxic-appearing or diaphoretic.  HENT:     Head: Normocephalic.  Eyes:     Conjunctiva/sclera: Conjunctivae normal.  Cardiovascular:     Rate and Rhythm: Normal rate and regular rhythm.     Pulses: Normal pulses.  Pulmonary:     Effort: Pulmonary effort is normal. No respiratory distress.  Abdominal:     Tenderness: There is abdominal tenderness (suprapubic fullness and tenderness prior to irrigation).  Musculoskeletal:        General: No deformity or signs of injury.     Cervical back: No rigidity.  Skin:    General: Skin is warm and dry.     Coloration: Skin is not jaundiced or pale.  Neurological:     General: No focal deficit present.     Mental Status: He is alert and oriented to person, place, and time.     ED Results / Procedures / Treatments   Labs (all labs ordered are listed, but only abnormal results are displayed) Labs Reviewed  URINALYSIS, ROUTINE W REFLEX MICROSCOPIC - Abnormal; Notable for the following  components:      Result Value   APPearance CLOUDY (*)    Hgb urine dipstick MODERATE (*)    Leukocytes,Ua LARGE (*)    All other components within normal limits  URINALYSIS, MICROSCOPIC (REFLEX) - Abnormal; Notable for the following components:   Bacteria, UA FEW (*)    Crystals CHOLESTEROL CRYSTALS PRESENT (*)    All other components within normal limits  URINE CULTURE    EKG None  Radiology No results found.  Procedures Procedures    Medications Ordered in ED Medications - No data to display  ED Course/ Medical Decision  Making/ A&P                           ] 87 year old male with a history of cardiomyopathy, coronary artery disease, CKD, hypertension, hyperlipidemia, non-small cell lung cancer with resection in 2013, BPH with 4 negative prostate biopsies by report who underwent greenlight laser ablation by Dr. Gaynelle Arabian, TURP in 2015, urethral stricture with SP tube emergently placed by IR 07/18/2022 with preference to keep SP tube rather than urethral reconstruction, SP replacement last exchanged 12/25/2022 presents with concern for obstructed suprapubic catheter.   Catheter irrigated by RN and again draining.  600 estimated in containers and noted a lot had leaked also on pads and suspected larger amount drained following irrigation.  Symptoms resolved. Bedside US without continuing retention.  It appears to be continuing to drain normally.  Being treated with bactrim for scrotal infection, does not have signs of continued or worsening cellulitis.  UA sent given obstruction of catheter with possbile etiology being pyeuria. UA negative nitrites, leukocyte negative, on bactrim now and few bacteria noted, wbc present may be related to indwelling catheter.  Will send for culture but at this time do not feel UA and history consistent with UTI.  Given supplies for irrigation at home.  Discussed reasons to return. Recommend urology follow up. Patient discharged in stable condition with  understanding of reasons to return.         Final Clinical Impression(s) / ED Diagnoses Final diagnoses:  Obstruction of suprapubic catheter, initial encounter [facility])    Rx / DC Orders ED Discharge Orders     None         Gareth Morgan, MD 01/04/23 2026

## 2023-01-04 NOTE — ED Triage Notes (Signed)
Pt c/o suprapubic tube blockage since 4 am,

## 2023-01-06 LAB — URINE CULTURE: Culture: >=100000 — AB

## 2023-01-07 ENCOUNTER — Telehealth (HOSPITAL_BASED_OUTPATIENT_CLINIC_OR_DEPARTMENT_OTHER): Payer: Self-pay | Admitting: *Deleted

## 2023-01-07 NOTE — Progress Notes (Signed)
ED Antimicrobial Stewardship Positive Culture Follow Up   [name] [name] is an 87 y.o. male who presented to [facility] on 01/04/2023 with a chief complaint of  Chief Complaint  Patient presents with   Suprapubic tube blockage    Recent Results (from the past 720 hour(s))  Urine Culture     Status: Abnormal   Collection Time: 01/04/23 10:28 AM   Specimen: Urine, Clean Catch  Result Value Ref Range Status   Specimen Description   Final    URINE, CLEAN CATCH Performed at [address] [facility], [address] [place], [place] [address]    Special Requests   Final    NONE Performed at [facility], [address] [place], [place] [address]    Culture >=100,000 COLONIES/mL ESCHERICHIA COLI (A)  Final   Report Status 01/06/2023 FINAL  Final   Organism ID, Bacteria ESCHERICHIA COLI (A)  Final      Susceptibility   Escherichia coli - MIC*    AMPICILLIN >=32 RESISTANT Resistant     CEFAZOLIN <=4 SENSITIVE Sensitive     CEFEPIME <=0.12 SENSITIVE Sensitive     CEFTRIAXONE <=0.25 SENSITIVE Sensitive     CIPROFLOXACIN 0.5 INTERMEDIATE Intermediate     GENTAMICIN <=1 SENSITIVE Sensitive     IMIPENEM <=0.25 SENSITIVE Sensitive     NITROFURANTOIN <=16 SENSITIVE Sensitive     TRIMETH/SULFA >=320 RESISTANT Resistant     AMPICILLIN/SULBACTAM >=32 RESISTANT Resistant     PIP/TAZO <=4 SENSITIVE Sensitive     * >=100,000 COLONIES/mL ESCHERICHIA COLI    Patient discharged originally without antimicrobial agent. Pt was initiated on Bactrim DS 1 tablet by mouth twice daily x 10 days on 12/25/22 for scrotal infection. E. coli growing in urine culture is resistant to Bactrim.   New antibiotic prescription: Please call for symptom check. If pt does not report any further symptoms of suprapubic pain/tenderness, nausea, vomiting, fever, flank pain, or minimal urinary drainage from suprapubic catheter, no further treatment is indicated. However, if pt continues to endorse symptoms, please  call in prescription for cefadroxil 500mg  by mouth twice daily x 10 days.   Pt has urology f/u appointment on 01/08/23. Encourage pt to f/u with urologist as scheduled.  ED Provider: Godfrey Pick, MD   [name] 01/07/2023, 12:00 PM Clinical Pharmacist Monday - Friday phone -  [phone] Saturday - Sunday phone - [phone]

## 2023-01-07 NOTE — Telephone Encounter (Signed)
Post ED Visit - Positive Culture Follow-up  Culture report reviewed by antimicrobial stewardship pharmacist: [place] Team []  Elenor Quinones, Pharm.D. []  Heide Guile, Pharm.D., BCPS AQ-ID []  Parks Neptune, Pharm.D., BCPS []  Alycia Rossetti, Pharm.D., BCPS []  [place], Pharm.D., BCPS, AAHIVP []  Legrand Como, Pharm.D., BCPS, AAHIVP []  Salome Arnt, PharmD, BCPS []  Johnnette Gourd, PharmD, BCPS []  Hughes Better, PharmD, BCPS []  Leeroy Cha, PharmD []  Laqueta Linden, PharmD, BCPS []  Albertina Parr, PharmD  [place] Team []  Leodis Sias, PharmD []  Lindell Spar, PharmD []  Royetta Asal, PharmD []  Graylin Shiver, Rph []  Rema Fendt) Glennon Mac, PharmD []  Arlyn Dunning, PharmD []  Netta Cedars, PharmD []  Dia Sitter, PharmD []  Leone Haven, PharmD []  Gretta Arab, PharmD []  Theodis Shove, PharmD []  Peggyann Juba, PharmD []  Reuel Boom, PharmD   Positive urine culture Symptom check completed.  Reports symptoms resolved and has F/U with Urology on 01/08/2023 and no further patient follow-up is required at this time.  Harlon Flor [facility] 01/07/2023, 4:32 PM

## 2023-01-08 DIAGNOSIS — R339 Retention of urine, unspecified: Secondary | ICD-10-CM | POA: Diagnosis not present

## 2023-01-08 DIAGNOSIS — N138 Other obstructive and reflux uropathy: Secondary | ICD-10-CM | POA: Diagnosis not present

## 2023-01-08 DIAGNOSIS — N401 Enlarged prostate with lower urinary tract symptoms: Secondary | ICD-10-CM | POA: Diagnosis not present

## 2023-01-08 DIAGNOSIS — N492 Inflammatory disorders of scrotum: Secondary | ICD-10-CM | POA: Diagnosis not present

## 2023-01-08 DIAGNOSIS — N35914 Unspecified anterior urethral stricture, male: Secondary | ICD-10-CM | POA: Diagnosis not present

## 2023-01-08 DIAGNOSIS — Z9359 Other cystostomy status: Secondary | ICD-10-CM | POA: Diagnosis not present

## 2023-01-08 DIAGNOSIS — N393 Stress incontinence (female) (male): Secondary | ICD-10-CM | POA: Diagnosis not present

## 2023-01-22 DIAGNOSIS — Z466 Encounter for fitting and adjustment of urinary device: Secondary | ICD-10-CM | POA: Diagnosis not present

## 2023-01-22 DIAGNOSIS — R338 Other retention of urine: Secondary | ICD-10-CM | POA: Diagnosis not present

## 2023-02-12 ENCOUNTER — Ambulatory Visit (HOSPITAL_BASED_OUTPATIENT_CLINIC_OR_DEPARTMENT_OTHER)
Admission: RE | Admit: 2023-02-12 | Discharge: 2023-02-12 | Disposition: A | Discharge status: Home / Self Care | Payer: PPO | Source: Ambulatory Visit | Attending: Oncology | Admitting: Oncology

## 2023-02-12 ENCOUNTER — Ambulatory Visit: Payer: PPO | Admitting: Oncology

## 2023-02-12 ENCOUNTER — Inpatient Hospital Stay: Payer: PPO | Attending: Oncology | Admitting: Oncology

## 2023-02-12 ENCOUNTER — Telehealth: Payer: Self-pay | Admitting: *Deleted

## 2023-02-12 VITALS — BP 108/73 | HR 60 | Temp 98.2°F | Resp 18 | Ht 65.0 in | Wt 156.8 lb

## 2023-02-12 DIAGNOSIS — Z85828 Personal history of other malignant neoplasm of skin: Secondary | ICD-10-CM | POA: Insufficient documentation

## 2023-02-12 DIAGNOSIS — C3412 Malignant neoplasm of upper lobe, left bronchus or lung: Secondary | ICD-10-CM

## 2023-02-12 DIAGNOSIS — I2510 Atherosclerotic heart disease of native coronary artery without angina pectoris: Secondary | ICD-10-CM | POA: Diagnosis not present

## 2023-02-12 DIAGNOSIS — Z85118 Personal history of other malignant neoplasm of bronchus and lung: Secondary | ICD-10-CM | POA: Diagnosis present

## 2023-02-12 NOTE — Telephone Encounter (Signed)
[name] notified of negative CT and F/U as scheduled.

## 2023-02-12 NOTE — Progress Notes (Signed)
  [name] [name] OFFICE PROGRESS NOTE   Diagnosis: Non-small cell lung cancer, squamous cell carcinoma of the skin  INTERVAL HISTORY:   Mr. Novosel returns as scheduled.  He feels well.  Good appetite and energy level.  He is followed every 6 months by Dr. Ronnald Ramp for skin cancer.  No dyspnea.  A suprapubic tube remains in place.  He is followed by Dr. Rosana Hoes.  Objective:  Vital signs in last 24 hours:  Blood pressure 108/73, pulse 60, temperature 98.2 F (36.8 C), temperature source Oral, resp. rate 18, height 5\' 5"  (1.651 m), weight 156 lb 12.8 oz (71.1 kg), SpO2 99 %.    HEENT: Radiation changes at the right neck.  No neck mass. Lymphatics: No cervical, supraclavicular, axillary, or inguinal nodes Resp: Lungs clear bilaterally Cardio: Regular rate and rhythm GI: No hepatosplenomegaly, no mass, nontender Vascular: No leg edema  Skin: Scars at the right malar region and right cheek are without evidence of recurrent tumor.  Slight soft fullness at the left posterior parotid/preauricular region without a discrete mass    Medications: I have reviewed the patient's current medications.   Assessment/Plan: 1.Squamous cell carcinoma of the right cheek, status post Mohs surgery in 2004   2. Local recurrence of squamous cell carcinoma and a right face lymph node in 2006, status post surgical excision followed by radiation   3. Left hilar fullness on a chest x-ray 02/26/2012-stable compared to a chest x-ray from February of 2012 and new compared to a chest x-ray 2007 . A chest CT on 05/05/2012 confirmed a left upper lung nodule consistent with a primary bronchogenic carcinoma. A PET scan on 05/18/12 confirmed a hypermetabolic left upper lung nodule with no evidence of thoracic nodal or extrathoracic hypermetabolic disease   -He is status post a left upper lobectomy on 06/16/2012 with the pathology confirming a 3.8 cm squamous cell carcinoma (T2a, N0)   -Restaging CT 06/16/2014  without evidence of recurrent disease   -Restaging CT 12/15/2014 without evidence of recurrent disease -Restaging CT 12/06/2015-no evidence of recurrent disease -Restaging chest CT 01/02/2017-no evidence of recurrent disease -Restaging chest CT 01/28/2018-no evidence of recurrent disease -Restaging chest CT 01/28/2019-no evidence of recurrent disease -Restaging chest CT 02/03/2020-no evidence of recurrent disease -Restaging chest CT 02/08/2021-no evidence of recurrent disease -CT abdomen/pelvis at [facility] 01/30/2022-no suspicious lung nodules, prominent right inguinal node favored reactive,  -Restaging chest CT 08/15/2022-no evidence of recurrent disease -Restaging chest CT 02/12/2023-no evidence of recurrent disease  3. diffuse wall thickening of the bladder, 4. History of BPH , status post a laser vaporization of the prostate in February 2014 and TUR August 2015   5. History of multiple skin cancers including basal cell carcinoma and squamous cell carcinomas   6. coronary artery disease, decreased left ventricular ejection , Status post coronary artery bypass surgery July 2017      Disposition: Mr Cuervo remains in clinical remission from lung cancer and squamous cell skin cancer.  He will continue follow-up with Dr. Ronnald Ramp for skin cancer.  He would like to continue follow-up in the oncology clinic.  He will return for an office visit and surveillance chest CT in 1 year.  Betsy Coder, MD  02/12/2023  10:47 AM

## 2023-02-12 NOTE — Telephone Encounter (Signed)
-----   Message from Ladell Pier, MD sent at 02/12/2023  3:04 PM EST ----- Please call patient, CT shows no evidence of cancer, follow-up as scheduled

## 2023-05-29 ENCOUNTER — Encounter: Payer: Self-pay | Admitting: Cardiology

## 2023-05-29 NOTE — Progress Notes (Signed)
Cardiology Office Note   Date:  05/30/2023   ID:  LYNDLE VIRGEN, DOB [birth year], MRN [id number]  PCP:  Pcp, No  Cardiologist:   None Referring: None  Chief Complaint  Patient presents with   Coronary Artery Disease      History of Present Illness: [name] [name] is a 87 y.o. male who presents for evaluation of coronary artery disease.  He was previously seen by Dr. Shirlee Latch in 2018.  He has CAD/CABG.  He had an EF of 45%.   He also saw a cardiologist at [facility] who has retired.        Bypass surgery was in 2013.  At the time of his CABG his TEE suggested his EF to be 15%.  However the most recent was as above in 2017.  He has also had a history of non-small cell lung cancer with left upper lobectomy.  He has a small aortic dilatation of 42 cm.  He has had follow-up CT scan in February of this year.  He is actually done well.  Denies any ongoing cardiovascular symptoms.  He actually is quite active.  He does a lot of yard work.  He can climb up a hill.  He might get a little winded at the top of the hill but he denies any cardiovascular symptoms such as chest pressure, neck or arm discomfort.  He has no new resting shortness of breath, PND or orthopnea.  He does not feel any palpitations and has had no presyncope or syncope.  He is going to have some urologic procedures .  He has an indwelling Foley and a fistula that might need repair.   Past Medical History:  Diagnosis Date   Arthritis    JOINT PAIN RIGHT HAND   BPH (benign prostatic hyperplasia)    [name]/elevated PSA, prostate biopsy x4 including one saturation biopsy, laser treatment 2/14; NOCTURIA   CAD (coronary artery disease) 12/31/2011   cath 05/2012 showing 80-90% stenosis of a trifurcating diagonal #1, 70-80% stenosis of OM3 and 90% stenosis of distal LCx after OM3 - medical management, [place]   Cardiomyopathy (HCC)    dilated cardiomyopathy EF 30%, MUGA  EF 42% 08/2012   Carotid artery occlusion    carotid  artery bruit   Chronic kidney disease    kidney stones -small passed.   History of shingles    Hyperlipidemia    statin intolerant   Hypertension    Lung mass    Stage 1B non-small cell lung CA s/p resection 2013   PVC (premature ventricular contraction)    Shingles    Sigmoid diverticulosis    Skin cancer    Multiple skin cancers    Squamous cell carcinoma, face    history of right face with mets to right upper cheek in 2004  and facial lymph node reoccurence post surgery with XRT   Trigger finger    Bilateral    Past Surgical History:  Procedure Laterality Date   APPENDECTOMY     CARDIAC CATHETERIZATION  05/30/2012   Has blockage- Treated with Medications.  Cardiologist - Dr Mayford Knife   CARDIAC CATHETERIZATION Left 07/10/2016   Procedure: Left Heart Cath and Coronary Angiography;  Surgeon: Lamar Blinks, MD;  Location: [facility] INVASIVE CV LAB;  Service: Cardiovascular;  Laterality: Left;   CATARACT EXTRACTION, BILATERAL     CORONARY ARTERY BYPASS GRAFT N/A 07/19/2016   Procedure: CORONARY ARTERY BYPASS GRAFTING (CABG) x 4 (LIMA to LAD, SVG to DIAGONAL, SVG  to CIRCUMFLEX, SVG to PDA) with EVH from RIGHT GREATER SAPHENOUS VEIN and LEFT INTERNAL MAMMARY ARTERY;  Surgeon: Delight Ovens, MD;  Location: [facility] OR;  Service: Open Heart Surgery;  Laterality: N/A;   excision ot metastatic lymph node followed by radiation  12/30/2004   .   GREEN LIGHT LASER TURP (TRANSURETHRAL RESECTION OF PROSTATE N/A 02/08/2013   Procedure: GREEN LIGHT LASER TURP (TRANSURETHRAL RESECTION OF PROSTATE;  Surgeon: Kathi Ludwig, MD;  Location: [facility];  Service: Urology;  Laterality: N/A;   HOLMIUM LASER APPLICATION Left 08/22/2014   Procedure: HOLMIUM LASER APPLICATION;  Surgeon: Kathi Ludwig, MD;  Location: [facility] ORS;  Service: Urology;  Laterality: Left;   LYMPH NODE DISSECTION  06/16/2012   Procedure: LYMPH NODE DISSECTION;  Surgeon: Delight Ovens, MD;  Location: [facility] OR;   Service: Thoracic;;   MOHS SURGERY  12/30/2002   RIB PLATING Right 07/19/2016   Procedure: Right sternoclavicular joint plating;  Surgeon: Delight Ovens, MD;  Location: [facility] OR;  Service: Open Heart Surgery;  Laterality: Right;   TEE WITHOUT CARDIOVERSION N/A 07/19/2016   Procedure: TRANSESOPHAGEAL ECHOCARDIOGRAM (TEE);  Surgeon: Delight Ovens, MD;  Location: [facility] OR;  Service: Open Heart Surgery;  Laterality: N/A;   TRANSURETHRAL RESECTION OF PROSTATE Left 08/22/2014   Procedure: TRANSURETHRAL RESECTION OF THE PROSTATE (TURP) CYSTOSCOPY, LEFT RETROGRADE PYLEGRAM,LEFT with STENT,URETEROSCOPY,PLACEMENT OF BACKSTOP,LASER FRAGMENTATION WITH BASKET EXTRACTION LEFT URETEROSCOPY;  Surgeon: Kathi Ludwig, MD;  Location: [facility];  Service: Urology;  Laterality: Left;   VIDEO BRONCHOSCOPY  06/16/2012   Procedure: VIDEO BRONCHOSCOPY;  Surgeon: Delight Ovens, MD;  Location: [facility] OR;  Service: Thoracic;  Laterality: N/A;   VIDEO BRONCHOSCOPY  06/24/2012   Procedure: VIDEO BRONCHOSCOPY;  Surgeon: Delight Ovens, MD;  Location: [facility] OR;  Service: Thoracic;  Laterality: N/A;     Current Outpatient Medications  Medication Sig Dispense Refill   Ascorbic Acid (VITAMIN C) 1000 MG tablet Take 1,000 mg by mouth daily.     aspirin 81 MG tablet Take 81 mg by mouth daily.     carvedilol (COREG) 6.25 MG tablet TAKE 1 TABLET (6.25 MG TOTAL) BY MOUTH 2 (TWO) TIMES DAILY. NEEDS OFFICE VISIT [phone] 60 tablet 0   ezetimibe (ZETIA) 10 MG tablet Take 10 mg by mouth daily.     Flaxseed, Linseed, (FLAXSEED OIL PO) Take 1,400 mg by mouth daily.      GARLIC PO Take 0,981 mg by mouth 2 (two) times daily.      Multiple Vitamin (MULTIVITAMIN WITH MINERALS) TABS tablet Take 1 tablet by mouth daily.     nitroGLYCERIN (NITROSTAT) 0.4 MG SL tablet Place 0.4 mg under the tongue every 5 (five) minutes as needed for chest pain.     Omega-3 Fatty Acids (FISH OIL) 1000 MG CAPS Take 1,000 mg by mouth in the morning and  at bedtime.     Red Yeast Rice Extract 600 MG CAPS Take 1 capsule by mouth 2 (two) times daily.     spironolactone (ALDACTONE) 25 MG tablet Take 1 tablet (25 mg total) by mouth daily. Call patient for office visit [phone] 90 tablet 0   VITAMIN D PO Take by mouth.     ZINC OXIDE PO Take 500 mg by mouth daily.     ferrous gluconate (FERGON) 324 MG tablet Take by mouth. (Patient not taking: Reported on 05/30/2023)     losartan (COZAAR) 25 MG tablet TAKE 1 TABLET (25 MG TOTAL) BY MOUTH  AT BEDTIME. 90 tablet 3   No current facility-administered medications for this visit.    Allergies:   Ciprofloxacin, Crestor [rosuvastatin], Ramipril, and Statins    Social History:  The patient  reports that he quit smoking about 57 years ago. His smoking use included cigarettes. He has never used smokeless tobacco. He reports current alcohol use of about 5.0 - 6.0 standard drinks of alcohol per week. He reports that he does not use drugs.   Family History:  The patient's family history includes Diabetes in his father; Heart disease in his child and mother; Lung cancer in his mother; Psoriasis in his child.    ROS:  Please see the history of present illness.   Otherwise, review of systems are positive for none.   All other systems are reviewed and negative.    PHYSICAL EXAM: VS:  BP 130/64 (BP Location: Left Arm, Patient Position: Sitting, Cuff Size: Normal)   Pulse 62   Ht 5\' 5"  (1.651 m)   Wt 159 lb (72.1 kg)   SpO2 97%   BMI 26.46 kg/m  , BMI Body mass index is 26.46 kg/m. GENERAL:  Well appearing HEENT:  Pupils equal round and reactive, fundi not visualized, oral mucosa unremarkable NECK:  No jugular venous distention, waveform within normal limits, carotid upstroke brisk and symmetric, no bruits, no thyromegaly LYMPHATICS:  No cervical, inguinal adenopathy LUNGS:  Clear to auscultation bilaterally BACK:  No CVA tenderness CHEST:  Well healed sternotomy scar. HEART:  PMI not displaced or  sustained,S1 and S2 within normal limits, no S3, no S4, no clicks, no rubs, no murmurs ABD:  Flat, positive bowel sounds normal in frequency in pitch, no bruits, no rebound, no guarding, no midline pulsatile mass, no hepatomegaly, no splenomegaly EXT:  2 plus pulses upper and diminished dorsalis pedis and posttibial's bilateral, no edema, no cyanosis no clubbing SKIN:  No rashes no nodules NEURO:  Cranial nerves II through XII grossly intact, motor grossly intact throughout PSYCH:  Cognitively intact, oriented to person place and time    EKG:  EKG is ordered today. The ekg ordered today demonstrates sinus rhythm, rate 62, left axis deviation, left ventricular hypertrophy, premature ventricular contractions, no acute ST-T wave changes.   Recent Labs: No results found for requested labs within last 365 days.    Lipid Panel    Component Value Date/Time   CHOL 157 03/17/2017 0940   TRIG 106 03/17/2017 0940   HDL 61 03/17/2017 0940   CHOLHDL 2.6 03/17/2017 0940   VLDL 21 03/17/2017 0940   LDLCALC 75 03/17/2017 0940   LDLDIRECT 147.6 11/08/2013 1025      Wt Readings from Last 3 Encounters:  05/30/23 159 lb (72.1 kg)  02/12/23 156 lb 12.8 oz (71.1 kg)  01/04/23 145 lb (65.8 kg)      Other studies Reviewed: Additional studies/ records that were reviewed today include: Extensive review of outside records. Review of the above records demonstrates:  Please see elsewhere in the note.     ASSESSMENT AND PLAN:  CAD: s/p CABG:  The patient has no new sypmtoms.  No further cardiovascular testing is indicated.  We will continue with aggressive risk reduction and meds as listed.  Hyperlipidemia:   He is on red yeast rice and has been intolerant of statins otherwise.  I explained to him that this is basically a statin.  He is on Zetia.  I will get a lipid profile at goal LDL in the 50s and he might  need PCSK9.   Chronic systolic CHF: Ischemic cardiomyopathy: EF 15% on TEE in 7/17 but  improved in 11/17 to 45%.  He seems to be euvolemic.  I am going to repeat an echocardiogram.  He has had some problems with a little bit of low blood pressure previously.  If his ejection fraction is about the same I probably want to switch but if there is any lower on the 45% and has been I might switch him to [facility]   CKD:   He has had renal insufficiency previously but had reasonable renal function on the last blood draw.   Preop: The patient is going to have an outpatient urologic procedure and would be acceptable risk for this.  No further testing would be indicated prior to this.   Current medicines are reviewed at length with the patient today.  The patient does not have concerns regarding medicines.  The following changes have been made:  no change  Labs/ tests ordered today include:   Orders Placed This Encounter  Procedures   Lipid panel   Lipoprotein A (LPA)   EKG 12-Lead   ECHOCARDIOGRAM COMPLETE   VAS US CAROTID     Disposition:   FU with me in six months.     Signed, Rollene Rotunda, MD  05/30/2023 5:36 PM    [facility]

## 2023-05-30 ENCOUNTER — Encounter: Payer: Self-pay | Admitting: Cardiology

## 2023-05-30 ENCOUNTER — Ambulatory Visit: Payer: PPO | Attending: Cardiology | Admitting: Cardiology

## 2023-05-30 VITALS — BP 130/64 | HR 62 | Ht 65.0 in | Wt 159.0 lb

## 2023-05-30 DIAGNOSIS — I25110 Atherosclerotic heart disease of native coronary artery with unstable angina pectoris: Secondary | ICD-10-CM

## 2023-05-30 DIAGNOSIS — I6523 Occlusion and stenosis of bilateral carotid arteries: Secondary | ICD-10-CM

## 2023-05-30 DIAGNOSIS — I420 Dilated cardiomyopathy: Secondary | ICD-10-CM

## 2023-05-30 DIAGNOSIS — I255 Ischemic cardiomyopathy: Secondary | ICD-10-CM

## 2023-05-30 NOTE — Patient Instructions (Signed)
Medication Instructions:  Continue same medications *If you need a refill on your cardiac medications before your next appointment, please call your pharmacy*   Lab Work: Lipid panel,LPa fasting   Testing/Procedures: Echo Carotid dopplers   Follow-Up: At [facility], you and your health needs are our priority.  As part of our continuing mission to provide you with exceptional heart care, we have created designated Provider Care Teams.  These Care Teams include your primary Cardiologist (physician) and Advanced Practice Providers (APPs -  Physician Assistants and Nurse Practitioners) who all work together to provide you with the care you need, when you need it.  We recommend signing up for the patient portal called "MyChart".  Sign up information is provided on this After Visit Summary.  MyChart is used to connect with patients for Virtual Visits (Telemedicine).  Patients are able to view lab/test results, encounter notes, upcoming appointments, etc.  Non-urgent messages can be sent to your provider as well.   To learn more about what you can do with MyChart, go to [email or web address].    Your next appointment:  After Test    Provider:  Dr.Hochrein

## 2023-06-03 LAB — LIPID PANEL
Chol/HDL Ratio: 3 ratio (ref 0.0–5.0)
Cholesterol, Total: 165 mg/dL (ref 100–199)
HDL: 55 mg/dL (ref >39)
LDL Chol Calc (NIH): 96 mg/dL (ref 0–99)
Triglycerides: 71 mg/dL (ref 0–149)
VLDL Cholesterol Cal: 14 mg/dL (ref 5–40)

## 2023-06-03 LAB — LIPOPROTEIN A (LPA): Lipoprotein (a): 166.3 nmol/L — ABNORMAL HIGH (ref <75.0)

## 2023-06-09 ENCOUNTER — Other Ambulatory Visit: Payer: Self-pay | Admitting: *Deleted

## 2023-06-09 DIAGNOSIS — I25110 Atherosclerotic heart disease of native coronary artery with unstable angina pectoris: Secondary | ICD-10-CM

## 2023-06-24 ENCOUNTER — Ambulatory Visit (INDEPENDENT_AMBULATORY_CARE_PROVIDER_SITE_OTHER): Payer: PPO

## 2023-06-24 DIAGNOSIS — I25110 Atherosclerotic heart disease of native coronary artery with unstable angina pectoris: Secondary | ICD-10-CM

## 2023-06-24 DIAGNOSIS — I420 Dilated cardiomyopathy: Secondary | ICD-10-CM | POA: Diagnosis not present

## 2023-06-24 DIAGNOSIS — I255 Ischemic cardiomyopathy: Secondary | ICD-10-CM

## 2023-06-24 DIAGNOSIS — I6523 Occlusion and stenosis of bilateral carotid arteries: Secondary | ICD-10-CM | POA: Diagnosis not present

## 2023-06-24 NOTE — Progress Notes (Unsigned)
  Cardiology Office Note:   Date:  06/26/2023  ID:  [name] [name], DOB February 01, 1936, MRN [id number] PCP: Aviva Kluver  [facility] Providers Cardiologist:  Rollene Rotunda, MD {  History of Present Illness:   [name] [name] is a 87 y.o. male who presents for evaluation of coronary artery disease.  He was previously seen by Dr. Shirlee Latch in 2018.  He has CAD/CABG.  He had an EF of 45%.   He also saw a cardiologist at [facility] who has retired.        Bypass surgery was in 2013.  At the time of his CABG his TEE suggested his EF to be 15%.  However the most recent was as above in 2017.  He has also had a history of non-small cell lung cancer with left upper lobectomy.  He has a small aortic dilatation of 42 cm.  He has had follow-up CT scan in February of 2024.  He had a follow up echo I reviewed his pulmonary looks like ejection fraction about 30%.  He had some moderate carotid stenosis.    He returns for follow-up. The patient denies any new symptoms such as chest discomfort, neck or arm discomfort. There has been no new shortness of breath, PND or orthopnea. There have been no reported palpitations, presyncope or syncope.    ROS: As stated in the HPI and negative for all other systems.  Studies Reviewed:    EKG:   NA   Risk Assessment/Calculations:              Physical Exam:   VS:  BP 136/68 (BP Location: Left Arm, Patient Position: Sitting, Cuff Size: Normal)   Pulse (!) 57   Ht 5\' 4"  (1.626 m)   Wt 155 lb (70.3 kg)   SpO2 99%   BMI 26.61 kg/m    Wt Readings from Last 3 Encounters:  06/26/23 155 lb (70.3 kg)  05/30/23 159 lb (72.1 kg)  02/12/23 156 lb 12.8 oz (71.1 kg)     GEN: Well nourished, well developed in no acute distress NECK: No JVD; No carotid bruits CARDIAC: RRR, no murmurs, rubs, gallops RESPIRATORY:  Clear to auscultation without rales, wheezing or rhonchi  ABDOMEN: Soft, non-tender, non-distended EXTREMITIES:  No edema; No deformity   ASSESSMENT AND  PLAN:   CAD: s/p CABG:  The patient has no new sypmtoms.  No further cardiovascular testing is indicated.  We will continue with aggressive risk reduction and meds as listed.   Hyperlipidemia: He is not at target with an LDL 97.  He agrees at least to try pravastatin and I will stop his red yeast rice and start pravastatin 80 mg daily with a repeat lipid in 3 months.  Chronic systolic CHF: Ischemic cardiomyopathy: EF previously had been 15% but by report up to 45% in 2017.  Now it looks to be around 30%.  I am waiting for the final echo but I did review the images.  I am going to stop his Cozaar and start Entresto 24/26 twice daily.  I will check a basic metabolic profile in a couple of weeks.  We will titrate slowly as he does have low blood pressures with dizziness occasionally.   CKD:   We will follow this closely.         Follow up me in two months.   Signed, Rollene Rotunda, MD

## 2023-06-26 ENCOUNTER — Ambulatory Visit: Payer: PPO | Attending: Cardiology | Admitting: Cardiology

## 2023-06-26 ENCOUNTER — Ambulatory Visit: Payer: PPO | Admitting: Cardiology

## 2023-06-26 ENCOUNTER — Encounter: Payer: Self-pay | Admitting: Cardiology

## 2023-06-26 ENCOUNTER — Other Ambulatory Visit: Payer: Self-pay | Admitting: *Deleted

## 2023-06-26 VITALS — BP 136/68 | HR 57 | Ht 64.0 in | Wt 155.0 lb

## 2023-06-26 DIAGNOSIS — Z79899 Other long term (current) drug therapy: Secondary | ICD-10-CM

## 2023-06-26 DIAGNOSIS — I255 Ischemic cardiomyopathy: Secondary | ICD-10-CM

## 2023-06-26 DIAGNOSIS — I6523 Occlusion and stenosis of bilateral carotid arteries: Secondary | ICD-10-CM

## 2023-06-26 DIAGNOSIS — E785 Hyperlipidemia, unspecified: Secondary | ICD-10-CM

## 2023-06-26 DIAGNOSIS — I25110 Atherosclerotic heart disease of native coronary artery with unstable angina pectoris: Secondary | ICD-10-CM | POA: Diagnosis not present

## 2023-06-26 MED ORDER — ENTRESTO 24-26 MG PO TABS: 1.0000 | ORAL_TABLET | Freq: Two times a day (BID) | ORAL | 11 refills | Status: DC

## 2023-06-26 MED ORDER — PRAVASTATIN SODIUM 80 MG PO TABS: 80.0000 mg | ORAL_TABLET | Freq: Every evening | ORAL | 3 refills | Status: DC

## 2023-06-26 NOTE — Patient Instructions (Addendum)
Medication Instructions:  STOP losartan  STOP red yeast rice  START entresto 24/26mg  twice daily START pravastatin 80mg  daily   *If you need a refill on your cardiac medications before your next appointment, please call your pharmacy*   Lab Work: Non-Fasting BMET in 2 weeks   Fasting lipid panel in 3 months  If you have labs (blood work) drawn today and your tests are completely normal, you will receive your results only by: MyChart Message (if you have MyChart) OR A paper copy in the mail If you have any lab test that is abnormal or we need to change your treatment, we will call you to review the results.    Follow-Up: At [facility], you and your health needs are our priority.  As part of our continuing mission to provide you with exceptional heart care, we have created designated Provider Care Teams.  These Care Teams include your primary Cardiologist (physician) and Advanced Practice Providers (APPs -  Physician Assistants and Nurse Practitioners) who all work together to provide you with the care you need, when you need it.  We recommend signing up for the patient portal called "MyChart".  Sign up information is provided on this After Visit Summary.  MyChart is used to connect with patients for Virtual Visits (Telemedicine).  Patients are able to view lab/test results, encounter notes, upcoming appointments, etc.  Non-urgent messages can be sent to your provider as well.   To learn more about what you can do with MyChart, go to [email or web address].    Your next appointment:    2 months with Dr. Antoine Poche   Other Instructions  [facility] [address] [place],  [place]  [address]  Main: [phone]

## 2023-06-27 LAB — ECHOCARDIOGRAM COMPLETE
Area-P 1/2: 2.95 cm2
Calc EF: 45 %
MV M vel: 1.8 m/s
MV Peak grad: 13 mmHg
S' Lateral: 4.8 cm
Single Plane A2C EF: 42.8 %
Single Plane A4C EF: 49.7 %

## 2023-06-30 ENCOUNTER — Encounter: Payer: Self-pay | Admitting: *Deleted

## 2023-07-04 ENCOUNTER — Ambulatory Visit: Payer: PPO | Attending: Cardiovascular Disease | Admitting: Pharmacist Clinician (PhC)/ Clinical Pharmacy Specialist

## 2023-07-04 ENCOUNTER — Encounter: Payer: Self-pay | Admitting: Pharmacist Clinician (PhC)/ Clinical Pharmacy Specialist

## 2023-07-04 DIAGNOSIS — I5023 Acute on chronic systolic (congestive) heart failure: Secondary | ICD-10-CM

## 2023-07-04 DIAGNOSIS — E785 Hyperlipidemia, unspecified: Secondary | ICD-10-CM | POA: Diagnosis not present

## 2023-07-04 MED ORDER — NITROGLYCERIN 0.4 MG SL SUBL: 0.4000 mg | SUBLINGUAL_TABLET | SUBLINGUAL | 12 refills | Status: AC | PRN

## 2023-07-04 NOTE — Assessment & Plan Note (Signed)
Assessment: Patient with ASCVD not at LDL goal of < 55 Lp(a) elevated at 166.3 Most recent LDL 96 on 06/02/23 Has been compliant with moderate intensity statin/ezetimibe :pravastatin 80, ezetimibe 10  Previously had issues with brain fog from statins, doing well with pravastatin so far Reviewed PCSK-9 inhibitors for lowering Lp(a).  Discussed mechanisms of action, dosing, side effects, potential decreases in LDL cholesterol and costs.  Also reviewed potential options for patient assistance (not available at this time)  Plan: Patient asks that we start PA process for Repatha, he'd like to think about it while the process is ongoing.   Will call next week with approval and determine if he is willing to start.  Repeat labs after:   Lipid Liver function Lp(a)

## 2023-07-04 NOTE — Progress Notes (Signed)
Office Visit    Patient Name: [name] [name] Date of Encounter: 07/04/2023  Primary Care Provider:  Pcp, No Primary Cardiologist:  Rollene Rotunda, MD  Chief Complaint    Hyperlipidemia   Significant Past Medical History   CAD CABG 2013 - x 4  CHF 4/24 EF at 40% - on Entresto, carvedilol, spironolactone  preDM 4/24 A1c 6.0           Allergies  Allergen Reactions   Ciprofloxacin Other (See Comments)    Patient took longer to process questions you ask him    Crestor [Rosuvastatin] Other (See Comments)   Ramipril Cough   Statins     Confusion Muscle pain     History of Present Illness    [name] [name] is a 87 y.o. male patient of Dr Antoine Poche, in the office today to discuss options for cholesterol management.  He is currently taking pravastatin and ezetimibe, both without problem, but recent blood draw found Lp(a) to be elevated.   In the office to discuss Repatha.    Insurance Carrier: HTA [id number]  Repatha $47/month, $94/3 months;   coverage gap $137/month  LDL Cholesterol goal:  LDL < 55  Family history:  mother had heart disease, lung cancer, father with DM; son had Brugada syndrome - cardiac arrest  Current Medications: pravastatin 80 mg every day - just started last week, ezetimibe 10 mg every day - has been on long term  Previously tried:  cholestyramine, colesevelam, lovaza, alirocumab   Social Hx: Tobacco: former smoker - quit in 1967 Alcohol: wine or beer several times per week     Accessory Clinical Findings   Lab Results  Component Value Date   CHOL 165 06/02/2023   HDL 55 06/02/2023   LDLCALC 96 06/02/2023   LDLDIRECT 147.6 11/08/2013   TRIG 71 06/02/2023   CHOLHDL 3.0 06/02/2023    Lipoprotein (a)  Date/Time Value Ref Range Status  06/02/2023 12:51 PM 166.3 (H) <75.0 nmol/L Final    Comment:    Note:  Values greater than or equal to 75.0 nmol/L may        indicate an independent risk factor for CHD,        but must be evaluated  with caution when applied        to non-Caucasian populations due to the        influence of genetic factors on Lp(a) across        ethnicities.     Lab Results  Component Value Date   ALT 19 08/21/2019   AST 18 08/21/2019   ALKPHOS 65 08/21/2019   BILITOT 0.7 08/21/2019   Lab Results  Component Value Date   CREATININE 1.46 (H) 08/21/2019   BUN 31 (H) 08/21/2019   NA 139 08/21/2019   K 4.1 08/21/2019   CL 106 08/21/2019   CO2 22 08/21/2019   Lab Results  Component Value Date   HGBA1C 5.8 (H) 07/17/2016    Home Medications    Current Outpatient Medications  Medication Sig Dispense Refill   Ascorbic Acid (VITAMIN C) 1000 MG tablet Take 1,000 mg by mouth daily.     aspirin 81 MG tablet Take 81 mg by mouth daily.     carvedilol (COREG) 6.25 MG tablet TAKE 1 TABLET (6.25 MG TOTAL) BY MOUTH 2 (TWO) TIMES DAILY. NEEDS OFFICE VISIT [phone] 60 tablet 0   ezetimibe (ZETIA) 10 MG tablet Take 10 mg by mouth daily.     Flaxseed, Linseed, (  FLAXSEED OIL PO) Take 1,400 mg by mouth daily.      GARLIC PO Take 1,610 mg by mouth 2 (two) times daily.      Multiple Vitamin (MULTIVITAMIN WITH MINERALS) TABS tablet Take 1 tablet by mouth daily.     nitroGLYCERIN (NITROSTAT) 0.4 MG SL tablet Place 1 tablet (0.4 mg total) under the tongue every 5 (five) minutes as needed for chest pain. 25 tablet 12   Omega-3 Fatty Acids (FISH OIL) 1000 MG CAPS Take 1,000 mg by mouth in the morning and at bedtime.     pravastatin (PRAVACHOL) 80 MG tablet Take 1 tablet (80 mg total) by mouth every evening. 90 tablet 3   sacubitril-valsartan (ENTRESTO) 24-26 MG Take 1 tablet by mouth 2 (two) times daily. 60 tablet 11   spironolactone (ALDACTONE) 25 MG tablet Take 1 tablet (25 mg total) by mouth daily. Call patient for office visit [phone] 90 tablet 0   VITAMIN D PO Take by mouth.     ZINC OXIDE PO Take 500 mg by mouth daily.     No current facility-administered medications for this visit.      Assessment & Plan    Dyslipidemia Assessment: Patient with ASCVD not at LDL goal of < 55 Lp(a) elevated at 166.3 Most recent LDL 96 on 06/02/23 Has been compliant with moderate intensity statin/ezetimibe :pravastatin 80, ezetimibe 10  Previously had issues with brain fog from statins, doing well with pravastatin so far Reviewed PCSK-9 inhibitors for lowering Lp(a).  Discussed mechanisms of action, dosing, side effects, potential decreases in LDL cholesterol and costs.  Also reviewed potential options for patient assistance (not available at this time)  Plan: Patient asks that we start PA process for Repatha, he'd like to think about it while the process is ongoing.   Will call next week with approval and determine if he is willing to start.  Repeat labs after:   Lipid Liver function Lp(a)    Acute on chronic systolic CHF (congestive heart failure) (HCC) Reviewed purpose of Entresto for improving EF.  Also reviewed cost and discussed need for [employer] to cover.  Patient will look at income information and let us know next week if he might qualify for the funding.  Would cover Entresto, spironolactone and carvedilol.    Phillips Hay, PharmD CPP [facility] [address]  [place], [place] [address] [phone]  07/04/2023, 10:21 AM

## 2023-07-04 NOTE — Assessment & Plan Note (Signed)
Reviewed purpose of Entresto for improving EF.  Also reviewed cost and discussed need for [employer] to cover.  Patient will look at income information and let us know next week if he might qualify for the funding.  Would cover Entresto, spironolactone and carvedilol.

## 2023-07-04 NOTE — Patient Instructions (Signed)
Your Results:             Your most recent labs Goal  Total Cholesterol 165 < 200  Triglycerides 71 < 150  HDL (happy/good cholesterol) 55 > 40  LDL (lousy/bad cholesterol 96 < 55   Medication changes:  We will start the process to get Repatha covered by your insurance.  Once the prior authorization is complete, I will call/send a MyChart message to let you know and confirm pharmacy information.   You will take one injection every 14 days  Lab orders:  We want to repeat labs after 2-3 months.  We will send you a lab order to remind you once we get closer to that time.      FOR ENTRESTO, CARVEDILOL and SPIRONOLACTONE  Patient Assistance:    We will sign you up for a [employer] Grant once your medication is approved by [employer].  I will call you with the ID number, then you will take this information to the pharmacy.  They will bill it after your insurance, bringing your copay to $0.  The grant will pay the first $2,500 in a one year period.    ID   BIN [id number]  PCN [id number]  GRP [id number]    Thank you for choosing [facility]

## 2023-07-07 ENCOUNTER — Other Ambulatory Visit (HOSPITAL_COMMUNITY): Payer: Self-pay

## 2023-07-07 ENCOUNTER — Telehealth: Payer: Self-pay | Admitting: Pharmacist Clinician (PhC)/ Clinical Pharmacy Specialist

## 2023-07-07 ENCOUNTER — Telehealth: Payer: Self-pay

## 2023-07-07 NOTE — Telephone Encounter (Signed)
   Pre-operative Risk Assessment    Patient Name: [name] [name]  DOB: 07/07/1936 MRN: [id number]      Request for Surgical Clearance    Procedure:   1ST SURGERY CYSTOSCOPY, 2ND SURGERY: REPAIR URETHROCUTANEOUS FISTULA, URETHRAL REPAIR, POSSIBLE GRACILIS INTERPOSITION FLAP  Date of Surgery:  Clearance 07/15/23       AND 09//20/24                          Surgeon:  Brent General INDICATED  Surgeon's Group or Practice Name:  [facility] UROLOGY [address] DR. Janalyn Rouse [place] Phone number:  [phone] Fax number:  [phone]   Type of Clearance Requested:   - Medical  - Pharmacy:  Hold Aspirin NEEDS INSTRUCTIONS WHEN TO HOLD   Type of Anesthesia:  Not Indicated   Additional requests/questions:    SignedMichaelle Copas   07/07/2023, 5:09 PM

## 2023-07-07 NOTE — Telephone Encounter (Signed)
Please do PA for Repatha  (Check w/ patient after approval - may qualify for [facility] grant for cardiomyopathy meds)

## 2023-07-08 ENCOUNTER — Telehealth: Payer: Self-pay | Admitting: *Deleted

## 2023-07-08 ENCOUNTER — Other Ambulatory Visit: Payer: Self-pay | Admitting: Thoracic Surgery (Cardiothoracic Vascular Surgery)

## 2023-07-08 DIAGNOSIS — C3412 Malignant neoplasm of upper lobe, left bronchus or lung: Secondary | ICD-10-CM

## 2023-07-08 NOTE — Telephone Encounter (Signed)
   Pre-operative Risk Assessment    Patient Name: [name] [name]  DOB: 08/24/1936 MRN: [id number]      Request for Surgical Clearance    Procedure:   PT HAS 2 PROCEDURES SCHEDULED: THE FIRST SURGERY IS SCHEDULED FOR 07/15/23 CYSTOSCOPY; THE 2ND SURGERY IS PLANNED FOR 09/19/23 REPAIR OF URETHROCUTANEOUS FISTULA, URETHRAL REPAIR, POSSIBLE GRACILIS INTERPOSITION FLAP  Date of Surgery:  Clearance 07/15/23   & 09/19/23                              Surgeon:  DR. Elige Radon DAVID FIGLER Surgeon's Group or Practice Name:  [facility] Phone number:  [phone] Fax number:  [phone]   Type of Clearance Requested:   - Medical ;ASA    Type of Anesthesia:  Not Indicated (GENERAL?)   Additional requests/questions:    [name]   07/08/2023, 3:36 PM

## 2023-07-08 NOTE — Telephone Encounter (Signed)
   Name: [name] [name]  DOB: 1936-11-12  MRN: [id number]  Primary Cardiologist: Rollene Rotunda, MD   Preoperative team, please contact this patient and set up a phone call appointment for further preoperative risk assessment. Please obtain consent and complete medication review. Thank you for your help.  Per Dr. Antoine Poche, "Please call the patient.  He is moderate to high risk because of his previous CABG and reduced EF.  However, this sounds like a necessary surgery.  We need to make sure that he understands the risk.  Send results to the operating physician."      I confirm that guidance regarding antiplatelet and oral anticoagulation therapy has been completed and, if necessary, noted below.  Ideally aspirin should be continued without interruption, however if the bleeding risk is too great, aspirin may be held for 5-7 days prior to surgery. Please resume aspirin post operatively when it is felt to be safe from a bleeding standpoint.     Carlos Levering, NP 07/08/2023, 3:40 PM [facility]

## 2023-07-08 NOTE — Telephone Encounter (Signed)
I s/w the pt and he has been scheduled for tele pre op appt 07/09/23 @ 9:40, due to med hold and procedure date.. see notes. Med rec and consent are done.     Patient Consent for Virtual Visit        [name] has provided verbal consent on 07/08/2023 for a virtual visit (video or telephone).   CONSENT FOR VIRTUAL VISIT FOR:  [name] [name]  By participating in this virtual visit I agree to the following:  I hereby voluntarily request, consent and authorize [facility] and its employed or contracted physicians, physician assistants, nurse practitioners or other licensed health care professionals (the Practitioner), to provide me with telemedicine health care services (the "Services") as deemed necessary by the treating Practitioner. I acknowledge and consent to receive the Services by the Practitioner via telemedicine. I understand that the telemedicine visit will involve communicating with the Practitioner through live audiovisual communication technology and the disclosure of certain medical information by electronic transmission. I acknowledge that I have been given the opportunity to request an in-person assessment or other available alternative prior to the telemedicine visit and am voluntarily participating in the telemedicine visit.  I understand that I have the right to withhold or withdraw my consent to the use of telemedicine in the course of my care at any time, without affecting my right to future care or treatment, and that the Practitioner or I may terminate the telemedicine visit at any time. I understand that I have the right to inspect all information obtained and/or recorded in the course of the telemedicine visit and may receive copies of available information for a reasonable fee.  I understand that some of the potential risks of receiving the Services via telemedicine include:  Delay or interruption in medical evaluation due to technological equipment failure or  disruption; Information transmitted may not be sufficient (e.g. poor resolution of images) to allow for appropriate medical decision making by the Practitioner; and/or  In rare instances, security protocols could fail, causing a breach of personal health information.  Furthermore, I acknowledge that it is my responsibility to provide information about my medical history, conditions and care that is complete and accurate to the best of my ability. I acknowledge that Practitioner's advice, recommendations, and/or decision may be based on factors not within their control, such as incomplete or inaccurate data provided by me or distortions of diagnostic images or specimens that may result from electronic transmissions. I understand that the practice of medicine is not an exact science and that Practitioner makes no warranties or guarantees regarding treatment outcomes. I acknowledge that a copy of this consent can be made available to me via my patient portal [facility] MyChart), or I can request a printed copy by calling the office of [facility].    I understand that my insurance will be billed for this visit.   I have read or had this consent read to me. I understand the contents of this consent, which adequately explains the benefits and risks of the Services being provided via telemedicine.  I have been provided ample opportunity to ask questions regarding this consent and the Services and have had my questions answered to my satisfaction. I give my informed consent for the services to be provided through the use of telemedicine in my medical care

## 2023-07-08 NOTE — Telephone Encounter (Signed)
I s/w the pt and he has been scheduled for tele pre op appt 07/09/23 @ 9:40, due to med hold and procedure date.. see notes. Med rec and consent are done

## 2023-07-08 NOTE — Telephone Encounter (Signed)
Dr. Antoine Poche  You saw this patient on 06/26/2023. Will you please comment on medical clearance for 2 upcoming procedures? He is pending cystoscopy on 07/15/2023 and repair urethrocutaneous fistula, urethral repair, possible gracilis interposition flap on 09/19/2023.   Please route your response to P CV DIV Preop. I will communicate with requesting office once you have given recommendations.   Thank you!  Carlos Levering, NP

## 2023-07-08 NOTE — Telephone Encounter (Signed)
I tried to call the pt but phone did not ring. I will try again tomorrow.

## 2023-07-09 ENCOUNTER — Ambulatory Visit: Payer: PPO | Attending: Cardiology

## 2023-07-09 ENCOUNTER — Telehealth: Payer: Self-pay

## 2023-07-09 DIAGNOSIS — Z01810 Encounter for preprocedural cardiovascular examination: Secondary | ICD-10-CM

## 2023-07-09 NOTE — Telephone Encounter (Signed)
Pharmacy Patient Advocate Encounter   Prior Authorization for REPATHA has been APPROVED by [employer] from 7.8.24 to 1.4.25.      KEY; [facility]

## 2023-07-09 NOTE — Telephone Encounter (Signed)
Pharmacy Patient Advocate Encounter   Prior Authorization for REPATHA has been APPROVED by [employer] from 7.8.24 to 1.4.25.      KEY; [id number] 

## 2023-07-09 NOTE — Progress Notes (Signed)
Virtual Visit via Telephone Note   Because of ZALEN SEQUEIRA co-morbid illnesses, he is at least at moderate risk for complications without adequate follow up.  This format is felt to be most appropriate for this patient at this time.  The patient did not have access to video technology/had technical difficulties with video requiring transitioning to audio format only (telephone).  All issues noted in this document were discussed and addressed.  No physical exam could be performed with this format.  Please refer to the patient's chart for his consent to telehealth for [facility].  Evaluation Performed:  Preoperative cardiovascular risk assessment _____________   Date:  07/09/2023   Patient ID:  [name] [name], DOB 10/25/1936, MRN [id number] Patient Location:  Home Provider location:   Office  Primary Care Provider:  Pcp, No Primary Cardiologist:  Rollene Rotunda, MD  Chief Complaint / Patient Profile   87 y.o. y/o male with a h/o coronary artery disease status post CABG and ischemic cardiomyopathy previously seen by Dr. Shirlee Latch in 2018 who is pending two-step urologic surgery first being on 7/16 cystoscopy and second being 09/19/2019 for repair of urethrocutaneous fistula, urethral repair, possible gracilis interpositional flap and presents today for telephonic preoperative cardiovascular risk assessment.  History of Present Illness    PATTRICK [name] is a 87 y.o. male who presents via audio/video conferencing for a telehealth visit today.  Pt was last seen in cardiology clinic on 06/26/2023 by Dr. Pamella Pert and.  At that time [name] [name] was doing well without any new symptoms of chest discomfort or shortness of breath, orthopnea, or PND, no palpitations, no presyncope, no syncope.  The patient is now pending procedure as outlined above. Since his last visit, he tells me he has not had any chest pains or shortness of breath.  Luckily, he has never had chest pain.  His  coronary artery disease was caught early and he was not having any symptoms at that time.  Ultimately, he was seeing a different cardiologist and they wanted to put a pacemaker in.  He ended up seeing someone in our group and got a cardiac catheterization.  Ultimately required surgery with Dr. Tyrone Sage (CABG) and did well afterwards.  Upcoming urologic surgery will require a mild anesthetic and will be done through the urethra with no incisions.  This is a low risk surgery.  He currently has a Foley catheter in place.  Most recent echocardiogram reviewed with the patient again and is listed below.  His wife tells me that he does tire easily but this has been progressive over the last 6+ months.  Recent echocardiogram from 06/24/2023 showed LVEF 40%.  [name] [name] was initiated at office visit.  Ideally aspirin should be continued without interruption, however if the bleeding risk is too great, aspirin may be held for 5-7 days prior to surgery. Please resume aspirin post operatively when it is felt to be safe from a bleeding standpoint.   Past Medical History    Past Medical History:  Diagnosis Date   Arthritis    JOINT PAIN RIGHT HAND   BPH (benign prostatic hyperplasia)    [name]/elevated PSA, prostate biopsy x4 including one saturation biopsy, laser treatment 2/14; NOCTURIA   CAD (coronary artery disease) 12/31/2011   cath 05/2012 showing 80-90% stenosis of a trifurcating diagonal #1, 70-80% stenosis of OM3 and 90% stenosis of distal LCx after OM3 - medical management, [place]   Cardiomyopathy (HCC)    dilated cardiomyopathy EF 30%, MUGA  EF 42% 08/2012   Carotid artery occlusion    carotid artery bruit   Chronic kidney disease    kidney stones -small passed.   History of shingles    Hyperlipidemia    statin intolerant   Hypertension    Lung mass    Stage 1B non-small cell lung CA s/p resection 2013   PVC (premature ventricular contraction)    Shingles    Sigmoid diverticulosis    Skin  cancer    Multiple skin cancers    Squamous cell carcinoma, face    history of right face with mets to right upper cheek in 2004  and facial lymph node reoccurence post surgery with XRT   Trigger finger    Bilateral   Past Surgical History:  Procedure Laterality Date   APPENDECTOMY     CARDIAC CATHETERIZATION  05/30/2012   Has blockage- Treated with Medications.  Cardiologist - Dr Mayford Knife   CARDIAC CATHETERIZATION Left 07/10/2016   Procedure: Left Heart Cath and Coronary Angiography;  Surgeon: Lamar Blinks, MD;  Location: [facility] INVASIVE CV LAB;  Service: Cardiovascular;  Laterality: Left;   CATARACT EXTRACTION, BILATERAL     CORONARY ARTERY BYPASS GRAFT N/A 07/19/2016   Procedure: CORONARY ARTERY BYPASS GRAFTING (CABG) x 4 (LIMA to LAD, SVG to DIAGONAL, SVG to CIRCUMFLEX, SVG to PDA) with EVH from RIGHT GREATER SAPHENOUS VEIN and LEFT INTERNAL MAMMARY ARTERY;  Surgeon: Delight Ovens, MD;  Location: [facility] OR;  Service: Open Heart Surgery;  Laterality: N/A;   excision ot metastatic lymph node followed by radiation  12/30/2004   .   GREEN LIGHT LASER TURP (TRANSURETHRAL RESECTION OF PROSTATE N/A 02/08/2013   Procedure: GREEN LIGHT LASER TURP (TRANSURETHRAL RESECTION OF PROSTATE;  Surgeon: Kathi Ludwig, MD;  Location: [facility];  Service: Urology;  Laterality: N/A;   HOLMIUM LASER APPLICATION Left 08/22/2014   Procedure: HOLMIUM LASER APPLICATION;  Surgeon: Kathi Ludwig, MD;  Location: [facility] ORS;  Service: Urology;  Laterality: Left;   LYMPH NODE DISSECTION  06/16/2012   Procedure: LYMPH NODE DISSECTION;  Surgeon: Delight Ovens, MD;  Location: [facility] OR;  Service: Thoracic;;   MOHS SURGERY  12/30/2002   RIB PLATING Right 07/19/2016   Procedure: Right sternoclavicular joint plating;  Surgeon: Delight Ovens, MD;  Location: [facility] OR;  Service: Open Heart Surgery;  Laterality: Right;   TEE WITHOUT CARDIOVERSION N/A 07/19/2016   Procedure: TRANSESOPHAGEAL  ECHOCARDIOGRAM (TEE);  Surgeon: Delight Ovens, MD;  Location: [facility] OR;  Service: Open Heart Surgery;  Laterality: N/A;   TRANSURETHRAL RESECTION OF PROSTATE Left 08/22/2014   Procedure: TRANSURETHRAL RESECTION OF THE PROSTATE (TURP) CYSTOSCOPY, LEFT RETROGRADE PYLEGRAM,LEFT with STENT,URETEROSCOPY,PLACEMENT OF BACKSTOP,LASER FRAGMENTATION WITH BASKET EXTRACTION LEFT URETEROSCOPY;  Surgeon: Kathi Ludwig, MD;  Location: [facility] ORS;  Service: Urology;  Laterality: Left;   VIDEO BRONCHOSCOPY  06/16/2012   Procedure: VIDEO BRONCHOSCOPY;  Surgeon: Delight Ovens, MD;  Location: [facility] OR;  Service: Thoracic;  Laterality: N/A;   VIDEO BRONCHOSCOPY  06/24/2012   Procedure: VIDEO BRONCHOSCOPY;  Surgeon: Delight Ovens, MD;  Location: [facility] OR;  Service: Thoracic;  Laterality: N/A;    Allergies  Allergies  Allergen Reactions   Ciprofloxacin Other (See Comments)    Patient took longer to process questions you ask him    Crestor [Rosuvastatin] Other (See Comments)   Ramipril Cough   Statins     Confusion Muscle pain     Home Medications    Prior to  Admission medications   Medication Sig Start Date End Date Taking? Authorizing Provider  Ascorbic Acid (VITAMIN C) 1000 MG tablet Take 1,000 mg by mouth daily.    [provider]  aspirin 81 MG tablet Take 81 mg by mouth daily.    [provider]  carvedilol (COREG) 6.25 MG tablet TAKE 1 TABLET (6.25 MG TOTAL) BY MOUTH 2 (TWO) TIMES DAILY. NEEDS OFFICE VISIT [phone] 09/14/18   Bensimhon, Bevelyn Buckles, MD  ezetimibe (ZETIA) 10 MG tablet Take 10 mg by mouth daily.    [provider]  Flaxseed, Linseed, (FLAXSEED OIL PO) Take 1,400 mg by mouth daily.     [provider]  GARLIC PO Take 3,244 mg by mouth 2 (two) times daily.     [provider]  Multiple Vitamin (MULTIVITAMIN WITH MINERALS) TABS tablet Take 1 tablet by mouth daily.    [provider]  nitroGLYCERIN (NITROSTAT) 0.4 MG SL tablet  Place 1 tablet (0.4 mg total) under the tongue every 5 (five) minutes as needed for chest pain. 07/04/23   Rollene Rotunda, MD  Omega-3 Fatty Acids (FISH OIL) 1000 MG CAPS Take 1,000 mg by mouth in the morning and at bedtime.    [provider]  pravastatin (PRAVACHOL) 80 MG tablet Take 1 tablet (80 mg total) by mouth every evening. 06/26/23 09/24/23  Rollene Rotunda, MD  sacubitril-valsartan (ENTRESTO) 24-26 MG Take 1 tablet by mouth 2 (two) times daily. 06/26/23   Rollene Rotunda, MD  spironolactone (ALDACTONE) 25 MG tablet Take 1 tablet (25 mg total) by mouth daily. Call patient for office visit [phone] 05/26/18   Laurey Morale, MD  VITAMIN D PO Take by mouth.    [provider]  ZINC OXIDE PO Take 500 mg by mouth daily.    [provider]    Physical Exam    Vital Signs:  MAC DOWDELL does not have vital signs available for review today.  Given telephonic nature of communication, physical exam is limited. AAOx3. NAD. Normal affect.  Speech and respirations are unlabored.  Accessory Clinical Findings    None  Assessment & Plan    1.  Preoperative Cardiovascular Risk Assessment:  Mr. Tennyson perioperative risk of a major cardiac event is 6.6% according to the Revised Cardiac Risk Index (RCRI).  Therefore, he is at high risk for perioperative complications.   His functional capacity is good at 5.62 METs according to the Duke Activity Status Index (DASI). Recommendations: According to ACC/AHA guidelines, no further cardiovascular testing needed.  The patient may proceed to surgery at acceptable risk.   Antiplatelet and/or Anticoagulation Recommendations: The patient should remain on Aspirin without interruption.    The patient was advised that if he develops new symptoms prior to surgery to contact our office to arrange for a follow-up visit, and he verbalized understanding.   A copy of this note will be routed to requesting surgeon.  Time:    Today, I have spent 14 minutes with the patient with telehealth technology discussing medical history, symptoms, and management plan.     Sharlene Dory, PA-C  07/09/2023, 9:48 AM

## 2023-07-10 NOTE — Telephone Encounter (Signed)
Spoke with patient.  He has not decided about using Repatha at this time.  Wanted to review with his wife, but she was just hospitalized and he is more focused on her at the moment.    Answered all of his questions.  Will send MyChart message to him so that he can reply to it once he makes a decision.

## 2023-07-15 LAB — BASIC METABOLIC PANEL
BUN/Creatinine Ratio: 29 — ABNORMAL HIGH (ref 10–24)
BUN: 30 mg/dL — ABNORMAL HIGH (ref 8–27)
CO2: 26 mmol/L (ref 20–29)
Calcium: 9.1 mg/dL (ref 8.6–10.2)
Chloride: 104 mmol/L (ref 96–106)
Creatinine, Ser: 1.02 mg/dL (ref 0.76–1.27)
Glucose: 105 mg/dL — ABNORMAL HIGH (ref 70–99)
Potassium: 4.7 mmol/L (ref 3.5–5.2)
Sodium: 142 mmol/L (ref 134–144)
eGFR: 72 mL/min/{1.73_m2} (ref >59)

## 2023-07-15 LAB — LIPID PANEL
Chol/HDL Ratio: 2.9 ratio (ref 0.0–5.0)
Cholesterol, Total: 163 mg/dL (ref 100–199)
HDL: 56 mg/dL (ref >39)
LDL Chol Calc (NIH): 89 mg/dL (ref 0–99)
Triglycerides: 100 mg/dL (ref 0–149)
VLDL Cholesterol Cal: 18 mg/dL (ref 5–40)

## 2023-07-18 MED ORDER — ENTRESTO 24-26 MG PO TABS: 1.0000 | ORAL_TABLET | Freq: Two times a day (BID) | ORAL | 11 refills | Status: DC

## 2023-07-18 NOTE — Addendum Note (Signed)
Addended by: Malena Peer D on: 07/18/2023 06:56 AM   Modules accepted: Orders

## 2023-07-22 ENCOUNTER — Telehealth: Payer: Self-pay | Admitting: *Deleted

## 2023-07-22 ENCOUNTER — Other Ambulatory Visit: Payer: Self-pay | Admitting: Pharmacist

## 2023-07-22 DIAGNOSIS — E785 Hyperlipidemia, unspecified: Secondary | ICD-10-CM

## 2023-07-22 MED ORDER — EZETIMIBE 10 MG PO TABS: 10.0000 mg | ORAL_TABLET | Freq: Every day | ORAL | 3 refills | Status: DC

## 2023-07-22 NOTE — Telephone Encounter (Signed)
-----   Message from Rollene Rotunda sent at 07/20/2023 10:38 AM EDT ----- LDL is not at target.  Will he consider taking Zetia 10 mg po daily in addition to the Pravachol .  If so dispense 90 with three refills.  Repeat lipid and liver in 3 months.  Call Mr. Martel with the results and send results to Pcp, No

## 2023-07-22 NOTE — Telephone Encounter (Signed)
Pt has reviewed results via my chart  New script sent to the pharmacy  Lab orders mailed to the pt  

## 2023-07-29 ENCOUNTER — Other Ambulatory Visit: Payer: Self-pay | Admitting: Orthopaedic Surgery

## 2023-07-29 ENCOUNTER — Telehealth: Payer: Self-pay | Admitting: *Deleted

## 2023-07-29 DIAGNOSIS — Z01818 Encounter for other preprocedural examination: Secondary | ICD-10-CM

## 2023-07-29 NOTE — Telephone Encounter (Signed)
   Pre-operative Risk Assessment    Patient Name: [name] [name]  DOB: 1936/02/23 MRN: [id number]      Request for Surgical Clearance    Procedure:   RIGHT REVERSE TOTAL SHOULDER REPLACEMENT  Date of Surgery:  Clearance TBD                                 Surgeon:  DR. Ramond Marrow Surgeon's Group or Practice Name:  [facility] Phone number:  [phone] [phone] ATTN: Silvestre Mesi Fax number:  [phone]   Type of Clearance Requested:   - Medical ; ASA    Type of Anesthesia:  General  WITH INTERSCALENE BLOCK   Additional requests/questions:    [name]   07/29/2023, 1:08 PM

## 2023-07-30 NOTE — Telephone Encounter (Signed)
   Patient Name: [name] [name]  DOB: 1936-05-22 MRN: [id number]  Primary Cardiologist: Rollene Rotunda, MD  Chart reviewed as part of pre-operative protocol coverage. Given past medical history and time since last visit, based on ACC/AHA guidelines, [name] [name] is at acceptable risk for the planned procedure without further cardiovascular testing.  Patient's RCRI score is 11% and he is still able to complete greater than 4 METS of activity without any difficulty.  Regarding ASA therapy, it may be stopped 5-7 days prior to surgery with a plan to resume it as soon as felt to be feasible from a surgical standpoint in the post-operative period.    The patient was advised that if he develops new symptoms prior to surgery to contact our office to arrange for a follow-up visit, and he verbalized understanding.  I will route this recommendation to the requesting party via Epic fax function and remove from pre-op pool.  Please call with questions.  Napoleon Form, Leodis Rains, NP 07/30/2023, 9:15 AM

## 2023-08-13 NOTE — Progress Notes (Unsigned)
  Cardiology Office Note:   Date:  08/14/2023  ID:  [name] [name], DOB [birth year], MRN [id number] PCP: Aviva Kluver  [facility] Providers Cardiologist:  Rollene Rotunda, MD {  History of Present Illness:   [name] [name] is a 87 y.o. male who presents for evaluation of coronary artery disease.  He was previously seen by Dr. Shirlee Latch in 2018.  He has CAD/CABG.  He had an EF of 45%.   He also saw a cardiologist at [facility] who has retired.         Bypass surgery was in 2013.  At the time of his CABG his TEE suggested his EF to be 15%.  However the most recent was as above in 2017.  He has also had a history of non-small cell lung cancer with left upper lobectomy.  He has a small aortic dilatation of 42 cm.  He has had follow-up CT scan in February of 2024.  He had a follow up echo I reviewed his pulmonary looks like ejection fraction that demonstrated an EF of 40%.  He had some moderate carotid stenosis.    He returns for follow-up. At the last visit I changed Cozaar to [facility].  He has done well since I saw him.  He did not any lightheadedness.  He had no presyncope or syncope.  He had no chest pressure, neck or arm discomfort.  ROS: As stated in the HPI and negative for all other systems.  Studies Reviewed:    EKG:   NA  Risk Assessment/Calculations:              Physical Exam:   VS:  BP 124/86   Pulse 60   Ht 5\' 3"  (1.6 m)   Wt 155 lb 9.6 oz (70.6 kg)   SpO2 99%   BMI 27.56 kg/m    Wt Readings from Last 3 Encounters:  08/14/23 155 lb 9.6 oz (70.6 kg)  06/26/23 155 lb (70.3 kg)  05/30/23 159 lb (72.1 kg)     GEN: Well nourished, well developed in no acute distress NECK: No JVD; No carotid bruits CARDIAC: RRR, no murmurs, rubs, gallops RESPIRATORY:  Clear to auscultation without rales, wheezing or rhonchi  ABDOMEN: Soft, non-tender, non-distended EXTREMITIES:  No edema; No deformity   ASSESSMENT AND PLAN:   CAD: s/p CABG:  The patient has no new sypmtoms.  No  further cardiovascular testing is indicated.  We will continue with aggressive risk reduction and meds as listed.   Hyperlipidemia: He is actually been switched to pravastatin.  He thought he was intolerant of statins but is tolerating this and his last LDL was 54.  He was given a prescription for Repatha but will get a hold off on starting this and I will repeat a lipid profile in 2 to 3 months..   Chronic systolic CHF:   Today in been increased Entresto to 49/51 twice daily.  He will get a basic metabolic profile in a couple of weeks.   CKD:    I will follow this closely as above.  PREOP: He is due to have shoulder surgery.  There are no high risk findings.  He has no high risk symptoms.  He has a high functional level.  Therefore, according to ACC/AHA guidelines he is at acceptable risk for the planned surgery without further testing.       Follow up with me in 3 months.   Signed, Rollene Rotunda, MD

## 2023-08-14 ENCOUNTER — Ambulatory Visit: Payer: PPO | Attending: Cardiology | Admitting: Cardiology

## 2023-08-14 ENCOUNTER — Encounter: Payer: Self-pay | Admitting: Cardiology

## 2023-08-14 VITALS — BP 124/86 | HR 60 | Ht 63.0 in | Wt 155.6 lb

## 2023-08-14 DIAGNOSIS — I2510 Atherosclerotic heart disease of native coronary artery without angina pectoris: Secondary | ICD-10-CM | POA: Diagnosis not present

## 2023-08-14 DIAGNOSIS — I5022 Chronic systolic (congestive) heart failure: Secondary | ICD-10-CM | POA: Diagnosis not present

## 2023-08-14 DIAGNOSIS — E785 Hyperlipidemia, unspecified: Secondary | ICD-10-CM | POA: Diagnosis not present

## 2023-08-14 MED ORDER — ENTRESTO 49-51 MG PO TABS: 1.0000 | ORAL_TABLET | Freq: Two times a day (BID) | ORAL | 3 refills | Status: DC

## 2023-08-14 NOTE — Patient Instructions (Signed)
Medication Instructions:  Your physician has recommended you make the following change in your medication:  - ENTRESTO (41-51mg ), 1 tablet twice daily  *If you need a refill on your cardiac medications before your next appointment, please call your pharmacy*   Lab Work: Your physician recommends that you return for lab work.  - BMET in 2 weeks  - Lipid Panel in 3 months   If you have labs (blood work) drawn today and your tests are completely normal, you will receive your results only by: MyChart Message (if you have MyChart) OR A paper copy in the mail If you have any lab test that is abnormal or we need to change your treatment, we will call you to review the results.    Follow-Up: At [facility], you and your health needs are our priority.  As part of our continuing mission to provide you with exceptional heart care, we have created designated Provider Care Teams.  These Care Teams include your primary Cardiologist (physician) and Advanced Practice Providers (APPs -  Physician Assistants and Nurse Practitioners) who all work together to provide you with the care you need, when you need it.  We recommend signing up for the patient portal called "MyChart".  Sign up information is provided on this After Visit Summary.  MyChart is used to connect with patients for Virtual Visits (Telemedicine).  Patients are able to view lab/test results, encounter notes, upcoming appointments, etc.  Non-urgent messages can be sent to your provider as well.   To learn more about what you can do with MyChart, go to [email or web address].    Your next appointment:   3 month(s)  Provider:   Rollene Rotunda, MD

## 2023-08-15 ENCOUNTER — Ambulatory Visit
Admission: RE | Admit: 2023-08-15 | Discharge: 2023-08-15 | Disposition: A | Discharge status: Home / Self Care | Payer: PPO | Source: Ambulatory Visit | Attending: Orthopaedic Surgery | Admitting: Orthopaedic Surgery

## 2023-08-15 DIAGNOSIS — Z01818 Encounter for other preprocedural examination: Secondary | ICD-10-CM

## 2023-08-21 ENCOUNTER — Ambulatory Visit
Admission: RE | Admit: 2023-08-21 | Discharge: 2023-08-21 | Disposition: A | Discharge status: Home / Self Care | Payer: PPO | Source: Ambulatory Visit | Attending: Thoracic Surgery (Cardiothoracic Vascular Surgery) | Admitting: Thoracic Surgery (Cardiothoracic Vascular Surgery)

## 2023-08-21 ENCOUNTER — Telehealth: Payer: Self-pay | Admitting: Cardiology

## 2023-08-21 DIAGNOSIS — C3412 Malignant neoplasm of upper lobe, left bronchus or lung: Secondary | ICD-10-CM

## 2023-08-21 NOTE — Telephone Encounter (Signed)
Patient called to get clarification on Entresto. He is currently taking Entresto 24/26 mg and he thought he was told to double up on the [place]. Informed him his current dose of Entresto is 49/51 mg and a new Rx was sent to his pharmacy

## 2023-08-21 NOTE — Telephone Encounter (Signed)
Pt c/o medication issue:  1. Name of Medication: Entresto  2. How are you currently taking this medication (dosage and times per day)?   3. Are you having a reaction (difficulty breathing--STAT)?   4. What is your medication issue? need to know the correct directions on how he needs to take his Sherryll Burger

## 2023-08-26 ENCOUNTER — Ambulatory Visit: Payer: PPO | Admitting: Thoracic Surgery (Cardiothoracic Vascular Surgery)

## 2023-08-26 VITALS — BP 94/57 | HR 64 | Resp 20 | Ht 64.0 in | Wt 156.0 lb

## 2023-08-26 DIAGNOSIS — C3412 Malignant neoplasm of upper lobe, left bronchus or lung: Secondary | ICD-10-CM | POA: Diagnosis not present

## 2023-08-26 NOTE — Progress Notes (Signed)
[address]       [place] [address]             [phone]     HPI: Mr. Demello returns for follow-up of his history of lung cancer.  Maximous Suire is an 87 year old man with a history of left upper lobectomy for stage Ib non-small cell carcinoma of the lung in 2013.  And coronary bypass grafting for three-vessel disease with ischemic cardiomyopathy in 2017.  Both operations were by Dr. Tyrone Sage.  Dr. Tyrone Sage was following him with annual CT scans prior to retirement.  I last saw him in August 2023.  He was doing well at that time.  There is no evidence of recurrent disease.  In the interim since his last visit he is continued to do well.  He is having some shoulder issues and is scheduled to have shoulder surgery in the near future.  He has noted a firm prominence along his right clavicle where Dr. Tyrone Sage had done plating of the right sternoclavicular joint at the time of his CABG.   No chest pain, pressure, tightness, or unusual shortness of breath.  He has lost some weight over the past couple of years.  Past Medical History:  Diagnosis Date   Arthritis    JOINT PAIN RIGHT HAND   BPH (benign prostatic hyperplasia)    [name]/elevated PSA, prostate biopsy x4 including one saturation biopsy, laser treatment 2/14; NOCTURIA   CAD (coronary artery disease) 12/31/2011   cath 05/2012 showing 80-90% stenosis of a trifurcating diagonal #1, 70-80% stenosis of OM3 and 90% stenosis of distal LCx after OM3 - medical management, [place]   Cardiomyopathy (HCC)    dilated cardiomyopathy EF 30%, MUGA  EF 42% 08/2012   Carotid artery occlusion    carotid artery bruit   Chronic kidney disease    kidney stones -small passed.   History of shingles    Hyperlipidemia    statin intolerant   Hypertension    Lung mass    Stage 1B non-small cell lung CA s/p resection 2013   PVC (premature ventricular contraction)    Shingles    Sigmoid diverticulosis    Skin cancer     Multiple skin cancers    Squamous cell carcinoma, face    history of right face with mets to right upper cheek in 2004  and facial lymph node reoccurence post surgery with XRT   Trigger finger    Bilateral    Current Outpatient Medications  Medication Sig Dispense Refill   Ascorbic Acid (VITAMIN C) 1000 MG tablet Take 1,000 mg by mouth daily.     aspirin 81 MG tablet Take 81 mg by mouth daily.     carvedilol (COREG) 6.25 MG tablet TAKE 1 TABLET (6.25 MG TOTAL) BY MOUTH 2 (TWO) TIMES DAILY. NEEDS OFFICE VISIT [phone] 60 tablet 0   ezetimibe (ZETIA) 10 MG tablet Take 1 tablet (10 mg total) by mouth daily. 90 tablet 3   Flaxseed, Linseed, (FLAXSEED OIL PO) Take 1,400 mg by mouth daily.      GARLIC PO Take 1,308 mg by mouth 2 (two) times daily.      Multiple Vitamin (MULTIVITAMIN WITH MINERALS) TABS tablet Take 1 tablet by mouth daily.     Omega-3 Fatty Acids (FISH OIL) 1000 MG CAPS Take 1,000 mg by mouth in the morning and at bedtime.     pravastatin (PRAVACHOL) 80 MG tablet Take 1 tablet (80 mg total) by mouth  every evening. 90 tablet 3   sacubitril-valsartan (ENTRESTO) 49-51 MG Take 1 tablet by mouth 2 (two) times daily. 180 tablet 3   spironolactone (ALDACTONE) 25 MG tablet Take 1 tablet (25 mg total) by mouth daily. Call patient for office visit [phone] (Patient taking differently: Take 12.5 mg by mouth daily. Call patient for office visit [phone]) 90 tablet 0   VITAMIN D PO Take by mouth.     ZINC OXIDE PO Take 500 mg by mouth daily.     nitroGLYCERIN (NITROSTAT) 0.4 MG SL tablet Place 1 tablet (0.4 mg total) under the tongue every 5 (five) minutes as needed for chest pain. (Patient not taking: Reported on 08/26/2023) 25 tablet 12   No current facility-administered medications for this visit.    Physical Exam BP (!) 94/57   Pulse 64   Resp 20   Ht 5\' 4"  (1.626 m)   Wt 156 lb (70.8 kg)   SpO2 95% Comment: RA  BMI 26.2 kg/m  87 year old man in no acute  distress Alert and oriented x 3 with no focal deficits Lungs diminished at left base otherwise clear.  No rales or wheezing Cardiac regular rate and rhythm normal S1 and S2 Palpable bony prominence versus plate along right clavicle medially No erythema or tenderness  Diagnostic Tests: CT CHEST WITHOUT CONTRAST   TECHNIQUE: Multidetector CT imaging of the chest was performed following the standard protocol without IV contrast.   RADIATION DOSE REDUCTION: This exam was performed according to the departmental dose-optimization program which includes automated exposure control, adjustment of the mA and/or kV according to patient size and/or use of iterative reconstruction technique.   COMPARISON:  Multiple priors, most recent chest CT dated February 12, 2023   FINDINGS: Cardiovascular: Mild cardiomegaly. No pericardial effusion. Ascending thoracic aorta is upper limits of normal in size, measuring up to 3.9 cm, with moderate calcified plaque. Severe coronary artery calcifications status post CABG. Mitral annular calcifications.   Mediastinum/Nodes: Esophagus is unremarkable. Prior right thyroidectomy. No enlarged lymph nodes seen in the chest.   Lungs/Pleura: Stable postsurgical findings of left upper lobe lobectomy. Remaining central airways are patent. New tiny solid nodule of the right upper lobe measuring 3 mm on series 8, image 44. Stable solid nodule of the right lower lobe measuring 4 mm on image 132. No pleural effusion.   Upper Abdomen: Nonobstructing left renal stones. Diverticulosis. No acute abnormality.   Musculoskeletal: Prior median sternotomy. No to next aggressive appearing osseous lesions.   IMPRESSION: 1. Stable postsurgical findings of left upper lobe lobectomy. No evidence of recurrent or metastatic disease. 2. New tiny solid nodule of the right upper lobe measuring 3 mm. Recommend attention on follow-up. 3. Aortic Atherosclerosis (ICD10-I70.0).      Electronically Signed   By: Allegra Lai M.D.   On: 08/21/2023 14:34   I personally reviewed the CT images.  Changes left upper lobectomy.  No evidence of recurrent disease.  3 mm right upper lobe nodule.  Aortic and coronary atherosclerosis.  Sternoclavicular plate visible unchanged from scan 2 years prior.  Impression: Saban Pegg is an 87 year old man with a history of left upper lobectomy for stage Ib non-small cell carcinoma of the lung in 2013.  And coronary bypass grafting for three-vessel disease with ischemic cardiomyopathy in 2017.  Both operations were by Dr. Tyrone Sage.  Dr. Tyrone Sage was following him with annual CT scans prior to retirement.  Stage Ia lung cancer-lobectomy in 2013.  Now over 10 years out with no  evidence of recurrent disease.  The CT did show a new 3 mm right upper lobe nodule.  Likely benign but will repeat a scan in 1 year.  CAD-no anginal symptoms.  Followed by Dr. Antoine Poche.  Sternoclavicular plate-there is no difference in the radiographic appearance from a couple of years ago.  He has lost some weight I suspect that is why he notices that more prominently.  No evidence of erosion or infection  Plan: Return in 1 year with CT chest  Loreli Slot, MD [facility] [phone]

## 2023-09-11 NOTE — Progress Notes (Addendum)
COVID Vaccine received:  []  No [x]  Yes Date of any COVID positive Test in last 90 days: None  PCP - Georgann Housekeeper, MD   Deboraha Sprang at [place] [phone] Cardiologist - Rollene Rotunda, MD  cardiac clearance in 08-14-23 Epic note Urology-  Alfredo Martinez, MD   Chest x-ray - CT chest wo Contrast  08-21-2023  Epic EKG - 05-30-2023  Epic  Stress Test - 07-26-2016  Epic ECHO - 06-27-2023  Epic Cardiac Cath -  07-10-2016  LHC cath prior to CABG x 4  PCR screen: [x]  Ordered & Completed           []   No Order but Needs PROFEND           []   N/A for this surgery  Surgery Plan:  []  Ambulatory   [x]  Outpatient in bed  []  Admit  Anesthesia:    [x]  General  []  Spinal  []   Choice []   MAC  Pacemaker / ICD device [x]  No []  Yes   Spinal Cord Stimulator:[x]  No []  Yes       History of Sleep Apnea? [x]  No []  Yes   CPAP used?- [x]  No []  Yes    Does the patient monitor blood sugar?  []  No []  Yes  [x]  N/A  Patient has: [x]  NO Hx DM   []  Pre-DM   []  DM1  []   DM2 Last A1c was:        on       Blood Thinner / Instructions:  none Aspirin Instructions:  ASA  81 mg,  may hold x 5-7 days per Robin Searing, NP in his 07-30-23 Epic note  ERAS Protocol Ordered: [x]  No  []  Yes Patient is to be NPO after: midnight prior  Comments: Patient was given the 5 CHG shower / bath instructions for  Reverse Shoulder arthroplasty surgery along with 2 bottles of the CHG soap. Patient will start this on: Saturday  09-20-2023 All questions were asked and answered, Patient voiced understanding of this process.   The patient was given Benzoyl peroxide Gel as ordered. Instruction regarding application starting 2 days prior to surgery was given and patient voiced understanding.   Activity level: Patient is able to climb a flight of stairs without difficulty; [x]  No CP  [x]  No SOB.  Patient can perform ADLs without assistance.   Anesthesia review: CABG x4 (07-19-2016), HTN, CHF, CM, CKD3a, S/p left upper lobectomy for Lung Ca (2013),  Has urethrocutaneous fistula- had recent EUA and suprapubic cath exchange done 07-15-23.   Patient denies shortness of breath, fever, cough and chest pain at PAT appointment.  Patient verbalized understanding and agreement to the Pre-Surgical Instructions that were given to them at this PAT appointment. Patient was also educated of the need to review these PAT instructions again prior to his/her surgery.I reviewed the appropriate phone numbers to call if they have any and questions or concerns.

## 2023-09-11 NOTE — Patient Instructions (Addendum)
SURGICAL WAITING ROOM VISITATION Patients having surgery or a procedure may have no more than 2 support people in the waiting area - these visitors may rotate in the visitor waiting room.   Due to an increase in RSV and influenza rates and associated hospitalizations, children ages 71 and under may not visit patients in [facility] hospitals. If the patient needs to stay at the hospital during part of their recovery, the visitor guidelines for inpatient rooms apply.  PRE-OP VISITATION  Pre-op nurse will coordinate an appropriate time for 1 support person to accompany the patient in pre-op.  This support person may not rotate.  This visitor will be contacted when the time is appropriate for the visitor to come back in the pre-op area.  Please refer to the [facility] website for the visitor guidelines for Inpatients (after your surgery is over and you are in a regular room).  You are not required to quarantine at this time prior to your surgery. However, you must do this: Hand Hygiene often Do NOT share personal items Notify your provider if you are in close contact with someone who has COVID or you develop fever 100.4 or greater, new onset of sneezing, cough, sore throat, shortness of breath or body aches.  If you test positive for Covid or have been in contact with anyone that has tested positive in the last 10 days please notify you surgeon.    Your procedure is scheduled on:  Wednesday  September 24, 2023  Report to [facility] Main Entrance: [place] entrance where the [employer] is available.   Report to admitting at: 08:00    AM  Call this number if you have any questions or problems the morning of surgery [phone]  DO NOT EAT OR DRINK ANYTHING AFTER MIDNIGHT THE NIGHT PRIOR TO YOUR SURGERY / PROCEDURE.   FOLLOW  ANY ADDITIONAL PRE OP INSTRUCTIONS YOU RECEIVED FROM YOUR SURGEON'S OFFICE!!!   Oral Hygiene is also important to reduce your risk of infection.         Remember - BRUSH YOUR TEETH THE MORNING OF SURGERY WITH YOUR REGULAR TOOTHPASTE  Do NOT smoke after Midnight the night before surgery.  STOP TAKING all Vitamins, Herbs and supplements 1 week before your surgery.   Stop taking your ASPIRIN 81mg  7 days prior to your surgery. Last dose will be taken on:  Tuesday 09-16-2023  Take ONLY these medicines the morning of surgery with A SIP OF WATER: carvedilol (Coreg)                    You may not have any metal on your body including jewelry, and body piercing  Do not wear  lotions, powders,  cologne, or deodorant  Men may shave face and neck.  Contacts, Hearing Aids, dentures or bridgework may not be worn into surgery. DENTURES WILL BE REMOVED PRIOR TO SURGERY PLEASE DO NOT APPLY "Poly grip" OR ADHESIVES!!!  You may bring a small overnight bag with you on the day of surgery, only pack items that are not valuable. [name] IS NOT RESPONSIBLE   FOR VALUABLES THAT ARE LOST OR STOLEN.   Do not bring your home medications to the hospital. The Pharmacy will dispense medications listed on your medication list to you during your admission in the Hospital.  Special Instructions: Bring a copy of your healthcare power of attorney and living will documents the day of surgery, if you wish to have them scanned into your  [place] Medical Records- EPIC  Please read over the following fact sheets you were given: IF YOU HAVE QUESTIONS ABOUT YOUR PRE-OP INSTRUCTIONS, PLEASE CALL [phone].      Pre-operative 5 CHG Bath Instructions   You can play a key role in reducing the risk of infection after surgery. Your skin needs to be as free of germs as possible. You can reduce the number of germs on your skin by washing with CHG (chlorhexidine gluconate) soap before surgery. CHG is an antiseptic soap that kills germs and continues to kill germs even after washing.   DO NOT use if you have an allergy to chlorhexidine/CHG or antibacterial soaps. If your  skin becomes reddened or irritated, stop using the CHG and notify one of our RNs at [phone]  Please shower with the CHG soap starting 4 days before surgery using the following schedule: START SHOWERS ON   SATURDAY  September 20, 2023                                                                                                                                                                                      Please keep in mind the following:  DO NOT shave, including legs and underarms, starting the day of your first shower.   You may shave your face at any point before/day of surgery.   Place clean sheets on your bed the day you start using CHG soap. Use a clean washcloth (not used since being washed) for each shower. DO NOT sleep with pets once you start using the CHG.   CHG Shower Instructions:  If you choose to wash your hair and private area, wash first with your normal shampoo/soap.  After you use shampoo/soap, rinse your hair and body thoroughly to remove shampoo/soap residue.  Turn the water OFF and apply about 3 tablespoons (45 ml) of CHG soap to a CLEAN washcloth.  Apply CHG soap ONLY FROM YOUR NECK DOWN TO YOUR TOES (washing for 3-5 minutes)  DO NOT use CHG soap on face, private areas, open wounds, or sores.  Pay special attention to the area where your surgery is being performed.  If you are having back surgery, having someone wash your back for you may be helpful.  Wait 2 minutes after CHG soap is applied, then you may rinse off the CHG soap.  Pat dry with a clean towel  Put on clean clothes/pajamas   If you choose to wear lotion, please use ONLY the CHG-compatible lotions on the back of this paper.     Additional instructions for the day of surgery: DO NOT APPLY any lotions, deodorants, cologne, or perfumes.  Put on clean/comfortable clothes.  Brush your teeth.  Ask your nurse before applying any prescription medications to the skin.      CHG  Compatible Lotions   Aveeno Moisturizing lotion  Cetaphil Moisturizing Cream  Cetaphil Moisturizing Lotion  Clairol Herbal Essence Moisturizing Lotion, Dry Skin  Clairol Herbal Essence Moisturizing Lotion, Extra Dry Skin  Clairol Herbal Essence Moisturizing Lotion, Normal Skin  Curel Age Defying Therapeutic Moisturizing Lotion with Alpha Hydroxy  Curel Extreme Care Body Lotion  Curel Soothing Hands Moisturizing Hand Lotion  Curel Therapeutic Moisturizing Cream, Fragrance-Free  Curel Therapeutic Moisturizing Lotion, Fragrance-Free  Curel Therapeutic Moisturizing Lotion, Original Formula  Eucerin Daily Replenishing Lotion  Eucerin Dry Skin Therapy Plus Alpha Hydroxy Crme  Eucerin Dry Skin Therapy Plus Alpha Hydroxy Lotion  Eucerin Original Crme  Eucerin Original Lotion  Eucerin Plus Crme Eucerin Plus Lotion  Eucerin TriLipid Replenishing Lotion  Keri Anti-Bacterial Hand Lotion  Keri Deep Conditioning Original Lotion Dry Skin Formula Softly Scented  Keri Deep Conditioning Original Lotion, Fragrance Free Sensitive Skin Formula  Keri Lotion Fast Absorbing Fragrance Free Sensitive Skin Formula  Keri Lotion Fast Absorbing Softly Scented Dry Skin Formula  Keri Original Lotion  Keri Skin Renewal Lotion Keri Silky Smooth Lotion  Keri Silky Smooth Sensitive Skin Lotion  Nivea Body Creamy Conditioning Oil  Nivea Body Extra Enriched Lotion  Nivea Body Original Lotion  Nivea Body Sheer Moisturizing Lotion Nivea Crme  Nivea Skin Firming Lotion  NutraDerm 30 Skin Lotion  NutraDerm Skin Lotion  NutraDerm Therapeutic Skin Cream  NutraDerm Therapeutic Skin Lotion  ProShield Protective Hand Cream  Provon moisturizing lotion    Preparing for Total Shoulder Arthroplasty ================================================================= Please follow these instructions carefully, in addition to any other special Bathing information that was explained to you at the Presurgical  Appointment:  BENZOYL PEROXIDE 5% GEL: Used to kill bacteria on the skin which could cause an infection at the surgery site.   Please do not use if you have an allergy to benzoyl peroxide. If your skin becomes reddened/irritated stop using the benzoyl peroxide and inform your Doctor.   Starting two days before surgery, apply as follows:  1. Apply benzoyl peroxide gel in the morning and at night. Apply after taking a shower. If you are not taking a shower, clean entire shoulder front, back, and side, along with the armpit with a clean wet washcloth.  2. Place a quarter-sized dollop of the gel on your SHOULDER and rub in thoroughly, making sure to cover the front, back, and side of your shoulder, along with the armpit.   2 Days prior to Surgery  MONDAY  September 15, 2023 First Application _______ Morning Second Application _______ Night  Day Before Surgery      TUESDAY   September 16, 2023 First Application______ Morning  On the night before surgery, wash your entire body (except hair, face and private areas) with CHG Soap. THEN, rub in the LAST application of the Benzoyl Peroxide Gel on your shoulder.   3. On the Morning of Surgery wash your BODY AGAIN with CHG Soap (except hair, face and private areas)  4. DO NOT USE THE BENZOYL PEROXIDE GEL ON THE DAY OF YOUR SURGERY        FAILURE TO FOLLOW THESE INSTRUCTIONS MAY RESULT IN THE CANCELLATION OF YOUR SURGERY  PATIENT SIGNATURE_________________________________  NURSE SIGNATURE__________________________________  ________________________________________________________________________     [name]    An incentive spirometer is a tool that can help keep your lungs clear and  active. This tool measures how well you are filling your lungs with each breath. Taking long deep breaths may help reverse or decrease the chance of developing breathing (pulmonary) problems (especially infection) following: A long period of  time when you are unable to move or be active. BEFORE THE PROCEDURE  If the spirometer includes an indicator to show your best effort, your nurse or respiratory therapist will set it to a desired goal. If possible, sit up straight or lean slightly forward. Try not to slouch. Hold the incentive spirometer in an upright position. INSTRUCTIONS FOR USE  Sit on the edge of your bed if possible, or sit up as far as you can in bed or on a chair. Hold the incentive spirometer in an upright position. Breathe out normally. Place the mouthpiece in your mouth and seal your lips tightly around it. Breathe in slowly and as deeply as possible, raising the piston or the ball toward the top of the column. Hold your breath for 3-5 seconds or for as long as possible. Allow the piston or ball to fall to the bottom of the column. Remove the mouthpiece from your mouth and breathe out normally. Rest for a few seconds and repeat Steps 1 through 7 at least 10 times every 1-2 hours when you are awake. Take your time and take a few normal breaths between deep breaths. The spirometer may include an indicator to show your best effort. Use the indicator as a goal to work toward during each repetition. After each set of 10 deep breaths, practice coughing to be sure your lungs are clear. If you have an incision (the cut made at the time of surgery), support your incision when coughing by placing a pillow or rolled up towels firmly against it. Once you are able to get out of bed, walk around indoors and cough well. You may stop using the incentive spirometer when instructed by your caregiver.  RISKS AND COMPLICATIONS Take your time so you do not get dizzy or light-headed. If you are in pain, you may need to take or ask for pain medication before doing incentive spirometry. It is harder to take a deep breath if you are having pain. AFTER USE Rest and breathe slowly and easily. It can be helpful to keep track of a log of your  progress. Your caregiver can provide you with a simple table to help with this. If you are using the spirometer at home, follow these instructions: SEEK MEDICAL CARE IF:  You are having difficultly using the spirometer. You have trouble using the spirometer as often as instructed. Your pain medication is not giving enough relief while using the spirometer. You develop fever of 100.5 F (38.1 C) or higher.                                                                                                    SEEK IMMEDIATE MEDICAL CARE IF:  You cough up bloody sputum that had not been present before. You develop fever of 102 F (38.9 C) or greater. You develop worsening pain at or  near the incision site. MAKE SURE YOU:  Understand these instructions. Will watch your condition. Will get help right away if you are not doing well or get worse. Document Released: 04/28/2007 Document Revised: 03/09/2012 Document Reviewed: 06/29/2007 [facility] Patient Information 2014 [place], [place].

## 2023-09-12 ENCOUNTER — Encounter (HOSPITAL_COMMUNITY): Payer: Self-pay

## 2023-09-12 ENCOUNTER — Other Ambulatory Visit: Payer: Self-pay

## 2023-09-12 ENCOUNTER — Encounter (HOSPITAL_COMMUNITY)
Admission: RE | Admit: 2023-09-12 | Discharge: 2023-09-12 | Disposition: A | Discharge status: Home / Self Care | Payer: PPO | Source: Ambulatory Visit | Attending: Orthopaedic Surgery | Admitting: Orthopaedic Surgery

## 2023-09-12 VITALS — BP 92/63 | HR 78 | Temp 98.0°F | Resp 16 | Ht 63.0 in | Wt 150.0 lb

## 2023-09-12 DIAGNOSIS — Z01812 Encounter for preprocedural laboratory examination: Secondary | ICD-10-CM | POA: Diagnosis present

## 2023-09-12 DIAGNOSIS — M75101 Unspecified rotator cuff tear or rupture of right shoulder, not specified as traumatic: Secondary | ICD-10-CM | POA: Diagnosis not present

## 2023-09-12 DIAGNOSIS — Z01818 Encounter for other preprocedural examination: Secondary | ICD-10-CM

## 2023-09-12 DIAGNOSIS — I2510 Atherosclerotic heart disease of native coronary artery without angina pectoris: Secondary | ICD-10-CM | POA: Diagnosis not present

## 2023-09-12 DIAGNOSIS — M12811 Other specific arthropathies, not elsewhere classified, right shoulder: Secondary | ICD-10-CM | POA: Diagnosis not present

## 2023-09-12 HISTORY — DX: Urethral fistula: N36.0

## 2023-09-12 HISTORY — DX: Heart failure, unspecified: I50.9

## 2023-09-12 HISTORY — DX: Personal history of urinary calculi: Z87.442

## 2023-09-12 LAB — CBC
HCT: 38.9 % — ABNORMAL LOW (ref 39.0–52.0)
Hemoglobin: 12.3 g/dL — ABNORMAL LOW (ref 13.0–17.0)
MCH: 30.5 pg (ref 26.0–34.0)
MCHC: 31.6 g/dL (ref 30.0–36.0)
MCV: 96.5 fL (ref 80.0–100.0)
Platelets: 192 10*3/uL (ref 150–400)
RBC: 4.03 MIL/uL — ABNORMAL LOW (ref 4.22–5.81)
RDW: 12.6 % (ref 11.5–15.5)
WBC: 7.1 10*3/uL (ref 4.0–10.5)
nRBC: 0 % (ref 0.0–0.2)

## 2023-09-12 LAB — SURGICAL PCR SCREEN
MRSA, PCR: NEGATIVE
Staphylococcus aureus: POSITIVE — AB

## 2023-09-12 LAB — BASIC METABOLIC PANEL
Anion gap: 9 (ref 5–15)
BUN: 28 mg/dL — ABNORMAL HIGH (ref 8–23)
CO2: 26 mmol/L (ref 22–32)
Calcium: 8.9 mg/dL (ref 8.9–10.3)
Chloride: 102 mmol/L (ref 98–111)
Creatinine, Ser: 1.1 mg/dL (ref 0.61–1.24)
GFR, Estimated: >60 mL/min (ref >60)
Glucose, Bld: 135 mg/dL — ABNORMAL HIGH (ref 70–99)
Potassium: 4.3 mmol/L (ref 3.5–5.1)
Sodium: 137 mmol/L (ref 135–145)

## 2023-09-12 NOTE — Progress Notes (Signed)
Patient's PCR screen is positive for MSRA / STAPH. Appropriate notes have been placed on the patient's chart. This note has been routed to Dr.Varkey and Alfonse Alpers, PA  for review. The Patient's surgery is currently scheduled for: 09-24-2023 at [facility].  Rudean Haskell, BSN, CVRN-BC   Pre-Surgical Testing Nurse [facility]  [phone]

## 2023-09-16 NOTE — Progress Notes (Signed)
Choose an anesthesia record to view details        DISCUSSION: [name] is an 87 yo male who presents to PAT prior to surgery right reverse shoulder arthroplasty. PMH significant for former smoking, HTN, CAD s/p CABG (06/2016), CHF, carotid artery disease, heart murmur, lung cancer s/p left upper lobectomy (2013), CKD, BPH, urethral fistula  Prior anestheisa complications include intra-op awareness  Patient follows with Cardiology for CAD s/p CABG, CHF, carotid artery disease. Last seen on 08/14/23 by Dr. Antoine Poche. Entresto dose was increased. Per Dr Antoine Poche: "PREOP: He is due to have shoulder surgery.  There are no high risk findings.  He has no high risk symptoms.  He has a high functional level.  Therefore, according to ACC/AHA guidelines he is at acceptable risk for the planned surgery without further testing."  Patient follows with CT surgery for hx of left upper lobectomy and hx of CABG. Last seen on 08/26/23. He has been getting yearly scans for surveillance. He has a new 3mm pulmonary nodule in the RUL that is being watched. Advised f/u in 1 year.   VS: BP 92/63 Comment: right arm sitting, patient feels normal even with low BP  Pulse 78   Temp 36.7 C (Oral)   Resp 16   Ht 5\' 3"  (1.6 m)   Wt 68 kg   SpO2 99%   BMI 26.57 kg/m   PROVIDERS: Pcp, No Cardiology: Rollene Rotunda, MD CT surgery: Charlett Lango, MD  LABS: Labs reviewed: Acceptable for surgery. (all labs ordered are listed, but only abnormal results are displayed)  Labs Reviewed  SURGICAL PCR SCREEN - Abnormal; Notable for the following components:      Result Value   Staphylococcus aureus POSITIVE (*)    All other components within normal limits  BASIC METABOLIC PANEL - Abnormal; Notable for the following components:   Glucose, Bld 135 (*)    BUN 28 (*)    All other components within normal limits  CBC - Abnormal; Notable for the following components:   RBC 4.03 (*)    Hemoglobin 12.3 (*)    HCT  38.9 (*)    All other components within normal limits     IMAGES:  CT Chest 08/21/23:  IMPRESSION: 1. Stable postsurgical findings of left upper lobe lobectomy. No evidence of recurrent or metastatic disease. 2. New tiny solid nodule of the right upper lobe measuring 3 mm. Recommend attention on follow-up. 3. Aortic Atherosclerosis (ICD10-I70.0).     EKG:   CV:  Echo 06/24/23: IMPRESSIONS     1. Left ventricular ejection fraction, by estimation, is 40 to 45%. The  left ventricle has mildly decreased function. The left ventricle  demonstrates global hypokinesis. The left ventricular internal cavity size  was mildly dilated. Left ventricular  diastolic parameters are consistent with Grade II diastolic dysfunction  (pseudonormalization). The average left ventricular global longitudinal  strain is -14.7 %. The global longitudinal strain is abnormal.   2. Right ventricular systolic function is normal. The right ventricular  size is normal. There is normal pulmonary artery systolic pressure.   3. Left atrial size was moderately dilated.   4. The mitral valve is normal in structure. Trivial mitral valve  regurgitation. No evidence of mitral stenosis.   5. The aortic valve is tricuspid. There is mild calcification of the  aortic valve. There is mild thickening of the aortic valve. Aortic valve  regurgitation is not visualized. No aortic stenosis is present.   6. The inferior vena  cava is normal in size with greater than 50%  respiratory variability, suggesting right atrial pressure of 3 mmHg.   Comparison(s): No significant change from prior study.   Carotid US 06/24/23: Summary: Right Carotid: Velocities in the right ICA are consistent with a 40-59%              stenosis.   Left Carotid: Velocities in the left ICA are consistent with a 40-59%  stenosis.   Vertebrals: Bilateral vertebral arteries demonstrate antegrade flow.  Subclavians: Normal flow hemodynamics were seen  in bilateral subclavian               arteries.   Suggest follow up study in 12 months.   Past Medical History:  Diagnosis Date   Arthritis    JOINT PAIN RIGHT HAND   BPH (benign prostatic hyperplasia)    [name]/elevated PSA, prostate biopsy x4 including one saturation biopsy, laser treatment 2/14; NOCTURIA   CAD (coronary artery disease) 12/31/2011   cath 05/2012 showing 80-90% stenosis of a trifurcating diagonal #1, 70-80% stenosis of OM3 and 90% stenosis of distal LCx after OM3 - medical management, [place]   Cardiomyopathy (HCC)    dilated cardiomyopathy EF 30%, MUGA  EF 42% 08/2012   Carotid artery occlusion    carotid artery bruit   CHF (congestive heart failure) (HCC)    Chronic kidney disease    CKD3a   Heart murmur    as a child   History of kidney stones    History of shingles    Hyperlipidemia    statin intolerant   Hypertension    Lung cancer (HCC) 2013   Left upper lobectomy   Lung mass    Stage 1B non-small cell lung CA s/p resection 2013   PVC (premature ventricular contraction)    Shingles    Sigmoid diverticulosis    Skin cancer    Multiple skin cancers    Squamous cell carcinoma, face    history of right face with mets to right upper cheek in 2004  and facial lymph node reoccurence post surgery with XRT   Trigger finger    Bilateral   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  Cardiologist - Dr Mayford Knife   CARDIAC CATHETERIZATION Left 07/10/2016   Procedure: Left Heart Cath and Coronary Angiography;  Surgeon: Lamar Blinks, MD;  Location: [facility] INVASIVE CV LAB;  Service: Cardiovascular;  Laterality: Left;   CATARACT EXTRACTION, BILATERAL     CORONARY ARTERY BYPASS GRAFT N/A 07/19/2016   Procedure: CORONARY ARTERY BYPASS GRAFTING (CABG) x 4 (LIMA to LAD, SVG to DIAGONAL, SVG to CIRCUMFLEX, SVG to PDA) with  EVH from RIGHT GREATER SAPHENOUS VEIN and LEFT INTERNAL MAMMARY ARTERY;  Surgeon: Delight Ovens, MD;  Location: [facility] OR;  Service: Open Heart Surgery;  Laterality: N/A;   excision ot metastatic lymph node followed by radiation  12/30/2004   .   GREEN LIGHT LASER TURP (TRANSURETHRAL RESECTION OF PROSTATE N/A 02/08/2013   Procedure: GREEN LIGHT LASER TURP (TRANSURETHRAL RESECTION OF PROSTATE;  Surgeon: Kathi Ludwig, MD;  Location: [facility];  Service: Urology;  Laterality: N/A;   HOLMIUM LASER APPLICATION Left 08/22/2014   Procedure: HOLMIUM LASER APPLICATION;  Surgeon: Kathi Ludwig, MD;  Location: [facility] ORS;  Service: Urology;  Laterality: Left;   LYMPH NODE DISSECTION  06/16/2012  Procedure: LYMPH NODE DISSECTION;  Surgeon: Delight Ovens, MD;  Location: [facility] OR;  Service: Thoracic;;   MOHS SURGERY  12/30/2002   RIB PLATING Right 07/19/2016   Procedure: Right sternoclavicular joint plating;  Surgeon: Delight Ovens, MD;  Location: [facility] OR;  Service: Open Heart Surgery;  Laterality: Right;   TEE WITHOUT CARDIOVERSION N/A 07/19/2016   Procedure: TRANSESOPHAGEAL ECHOCARDIOGRAM (TEE);  Surgeon: Delight Ovens, MD;  Location: [facility] OR;  Service: Open Heart Surgery;  Laterality: N/A;   TRANSURETHRAL RESECTION OF PROSTATE Left 08/22/2014   Procedure: TRANSURETHRAL RESECTION OF THE PROSTATE (TURP) CYSTOSCOPY, LEFT RETROGRADE PYLEGRAM,LEFT with STENT,URETEROSCOPY,PLACEMENT OF BACKSTOP,LASER FRAGMENTATION WITH BASKET EXTRACTION LEFT URETEROSCOPY;  Surgeon: Kathi Ludwig, MD;  Location: [facility] ORS;  Service: Urology;  Laterality: Left;   URINARY SPHINCTER IMPLANT     removed   VIDEO BRONCHOSCOPY  06/16/2012   Procedure: VIDEO BRONCHOSCOPY;  Surgeon: Delight Ovens, MD;  Location: [facility] OR;  Service: Thoracic;  Laterality: N/A;   VIDEO BRONCHOSCOPY  06/24/2012   Procedure: VIDEO BRONCHOSCOPY;  Surgeon: Delight Ovens, MD;  Location: [facility] OR;  Service: Thoracic;   Laterality: N/A;    MEDICATIONS:  Ascorbic Acid (VITAMIN C) 1000 MG tablet   aspirin 81 MG tablet   carvedilol (COREG) 6.25 MG tablet   ezetimibe (ZETIA) 10 MG tablet   Flaxseed, Linseed, (FLAXSEED OIL) 1400 MG CAPS   Garlic 1000 MG CAPS   Multiple Vitamin (MULTIVITAMIN WITH MINERALS) TABS tablet   nitroGLYCERIN (NITROSTAT) 0.4 MG SL tablet   Omega-3 Fatty Acids (FISH OIL) 1000 MG CAPS   pravastatin (PRAVACHOL) 80 MG tablet   sacubitril-valsartan (ENTRESTO) 49-51 MG   spironolactone (ALDACTONE) 25 MG tablet   VITAMIN D PO   ZINC OXIDE PO   No current facility-administered medications for this encounter.   Marcille Blanco [facility] Surgical Short Stay/Anesthesiology [facility] Phone [phone] 09/16/2023 12:17 PM

## 2023-09-16 NOTE — Anesthesia Preprocedure Evaluation (Addendum)
Anesthesia Evaluation  Patient identified by MRN, date of birth, ID band Patient awake    Reviewed: Allergy & Precautions, NPO status , Patient's Chart, lab work & pertinent test results, reviewed documented beta blocker date and time   History of Anesthesia Complications Negative for: history of anesthetic complications  Airway Mallampati: II  TM Distance: >3 FB Neck ROM: Full    Dental  (+) Partial Upper, Partial Lower   Pulmonary former smoker   Pulmonary exam normal        Cardiovascular hypertension, Pt. on medications and Pt. on home beta blockers + CAD, + CABG (2017) and +CHF  Normal cardiovascular exam  Echo 06/24/23: EF 40-45%, global hypokinesis, grade II DD, moderate LAE    Carotid US 06/24/23: right and left 40-59% stenosis    Neuro/Psych    GI/Hepatic negative GI ROS, Neg liver ROS,,,  Endo/Other  negative endocrine ROS    Renal/GU Renal InsufficiencyRenal disease     Musculoskeletal  (+) Arthritis ,    Abdominal   Peds  Hematology  (+) Blood dyscrasia (Hgb 12.3), anemia   Anesthesia Other Findings Day of surgery medications reviewed with patient.  Reproductive/Obstetrics                              Anesthesia Physical Anesthesia Plan  ASA: 3  Anesthesia Plan: General   Post-op Pain Management: Tylenol PO (pre-op)* and Regional block*   Induction: Intravenous  PONV Risk Score and Plan: 2 and Treatment may vary due to age or medical condition, Ondansetron and Dexamethasone  Airway Management Planned: Oral ETT  Additional Equipment: None  Intra-op Plan:   Post-operative Plan: Extubation in OR  Informed Consent: I have reviewed the patients History and Physical, chart, labs and discussed the procedure including the risks, benefits and alternatives for the proposed anesthesia with the patient or authorized representative who has indicated his/her understanding and  acceptance.     Dental advisory given  Plan Discussed with: CRNA  Anesthesia Plan Comments: (See PAT note from 9/13 by Sherlie Ban PA-C )         Anesthesia Quick Evaluation

## 2023-09-20 NOTE — H&P (Signed)
PREOPERATIVE H&P  Chief Complaint: RT SHOULDER DJD  HPI: [name] [name] is a 87 y.o. male who is scheduled for Procedure(s): REVERSE SHOULDER ARTHROPLASTY.   Patient has a past medical history significant for CAD, HFrEF (EF 30%), CKD, s/p lobectomy for NSCLC 2013 that we have been following for some time for bilateral shoulder pain.   ***  Symptoms are rated as moderate to severe, and have been worsening.  This is significantly impairing activities of daily living.    Please see clinic note for further details on this patient's care.    He has elected for surgical management.   Past Medical History:  Diagnosis Date   Arthritis    JOINT PAIN RIGHT HAND   BPH (benign prostatic hyperplasia)    [name]/elevated PSA, prostate biopsy x4 including one saturation biopsy, laser treatment 2/14; NOCTURIA   CAD (coronary artery disease) 12/31/2011   cath 05/2012 showing 80-90% stenosis of a trifurcating diagonal #1, 70-80% stenosis of OM3 and 90% stenosis of distal LCx after OM3 - medical management, [facility]   Cardiomyopathy (HCC)    dilated cardiomyopathy EF 30%, MUGA  EF 42% 08/2012   Carotid artery occlusion    carotid artery bruit   CHF (congestive heart failure) (HCC)    Chronic kidney disease    CKD3a   Heart murmur    as a child   History of kidney stones    History of shingles    Hyperlipidemia    statin intolerant   Hypertension    Lung cancer (HCC) 2013   Left upper lobectomy   Lung mass    Stage 1B non-small cell lung CA s/p resection 2013   PVC (premature ventricular contraction)    Shingles    Sigmoid diverticulosis    Skin cancer    Multiple skin cancers    Squamous cell carcinoma, face    history of right face with mets to right upper cheek in 2004  and facial lymph node reoccurence post surgery with XRT   Trigger finger    Bilateral   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  Cardiologist - Dr Mayford Knife   CARDIAC CATHETERIZATION Left 07/10/2016   Procedure: Left Heart Cath and Coronary Angiography;  Surgeon: Lamar Blinks, MD;  Location: [facility] INVASIVE CV LAB;  Service: Cardiovascular;  Laterality: Left;   CATARACT EXTRACTION, BILATERAL     CORONARY ARTERY BYPASS GRAFT N/A 07/19/2016   Procedure: CORONARY ARTERY BYPASS GRAFTING (CABG) x 4 (LIMA to LAD, SVG to DIAGONAL, SVG to CIRCUMFLEX, SVG to PDA) with EVH from RIGHT GREATER SAPHENOUS VEIN and LEFT INTERNAL MAMMARY ARTERY;  Surgeon: Delight Ovens, MD;  Location: [facility] OR;  Service: Open Heart Surgery;  Laterality: N/A;   excision ot metastatic lymph node followed by radiation  12/30/2004   .   GREEN LIGHT LASER TURP (TRANSURETHRAL RESECTION OF PROSTATE N/A 02/08/2013   Procedure: GREEN LIGHT LASER TURP (TRANSURETHRAL RESECTION OF PROSTATE;  Surgeon: Kathi Ludwig, MD;  Location: [facility];  Service: Urology;  Laterality: N/A;   HOLMIUM LASER APPLICATION Left 08/22/2014   Procedure: HOLMIUM LASER APPLICATION;  Surgeon: Kathi Ludwig, MD;  Location: [facility]S;  Service: Urology;  Laterality: Left;   LYMPH NODE DISSECTION  06/16/2012   Procedure: LYMPH NODE DISSECTION;  Surgeon: Delight Ovens, MD;  Location:  [facility] OR;  Service: Thoracic;;   MOHS SURGERY  12/30/2002   RIB PLATING Right 07/19/2016   Procedure: Right sternoclavicular joint plating;  Surgeon: Delight Ovens, MD;  Location: [facility] OR;  Service: Open Heart Surgery;  Laterality: Right;   TEE WITHOUT CARDIOVERSION N/A 07/19/2016   Procedure: TRANSESOPHAGEAL ECHOCARDIOGRAM (TEE);  Surgeon: Delight Ovens, MD;  Location: [facility] OR;  Service: Open Heart Surgery;  Laterality: N/A;   TRANSURETHRAL RESECTION OF PROSTATE Left 08/22/2014   Procedure: TRANSURETHRAL RESECTION OF THE PROSTATE (TURP) CYSTOSCOPY, LEFT RETROGRADE PYLEGRAM,LEFT with STENT,URETEROSCOPY,PLACEMENT  OF BACKSTOP,LASER FRAGMENTATION WITH BASKET EXTRACTION LEFT URETEROSCOPY;  Surgeon: Kathi Ludwig, MD;  Location: [facility];  Service: Urology;  Laterality: Left;   URINARY SPHINCTER IMPLANT     removed   VIDEO BRONCHOSCOPY  06/16/2012   Procedure: VIDEO BRONCHOSCOPY;  Surgeon: Delight Ovens, MD;  Location: [facility] OR;  Service: Thoracic;  Laterality: N/A;   VIDEO BRONCHOSCOPY  06/24/2012   Procedure: VIDEO BRONCHOSCOPY;  Surgeon: Delight Ovens, MD;  Location: [facility] OR;  Service: Thoracic;  Laterality: N/A;   Social History   Socioeconomic History   Marital status: Married    Spouse name: Not on file   Number of children: Not on file   Years of education: Not on file   Highest education level: Not on file  Occupational History   Not on file  Tobacco Use   Smoking status: Former    Current packs/day: 0.00    Types: Cigarettes    Quit date: 12/30/1965    Years since quitting: 57.7   Smokeless tobacco: Never  Vaping Use   Vaping status: Never Used  Substance and Sexual Activity   Alcohol use: Yes    Alcohol/week: 5.0 - 6.0 standard drinks of alcohol    Types: 3 - 4 Glasses of wine, 2 Cans of beer per week    Comment: infrequently- "indulge heavly when I do"   Drug use: No   Sexual activity: Not Currently  Other Topics Concern   Not on file  Social History Narrative   Lives at home with wife.  Two children.     Social Determinants of Health   Financial Resource Strain: Not on file  Food Insecurity: No Food Insecurity (11/12/2021)   Received from [facility] visits prior to 03/01/2023., [facility] visits prior to 03/01/2023.   Hunger Vital Sign    Worried About Programme researcher, broadcasting/film/video in the Last Year: Never true    Ran Out of Food in the Last Year: Never true  Transportation Needs: Not on file  Physical Activity: Not on file  Stress: Not on file  Social Connections: Not on file   Family History  Problem Relation Age of Onset    Diabetes Father    Psoriasis Child    Heart disease Child        resuscitated from cardiac arrest from Brugada's syndrome   Lung cancer Mother    Heart disease Mother    Allergies  Allergen Reactions   Ciprofloxacin Other (See Comments)    Patient took longer to process questions you ask him    Crestor [Rosuvastatin] Other (See Comments)   Ramipril Cough   Statins     Confusion Muscle pain    Prior to Admission medications   Medication Sig Start Date End Date Taking? Authorizing Provider  Ascorbic Acid (VITAMIN C) 1000 MG tablet Take 1,000 mg by mouth daily.   Yes [provider]  aspirin 81 MG tablet Take 81 mg by mouth daily.   Yes [provider]  carvedilol (COREG) 6.25 MG tablet TAKE 1 TABLET (6.25 MG TOTAL) BY MOUTH 2 (TWO) TIMES DAILY. NEEDS OFFICE VISIT [phone] 09/14/18  Yes Bensimhon, Bevelyn Buckles, MD  ezetimibe (ZETIA) 10 MG tablet Take 1 tablet (10 mg total) by mouth daily. 07/22/23  Yes Hochrein, Fayrene Fearing, MD  Flaxseed, Linseed, (FLAXSEED OIL) 1400 MG CAPS Take 1,400 mg by mouth daily.   Yes [provider]  Garlic 1000 MG CAPS Take 1,000 mg by mouth 2 (two) times daily.   Yes [provider]  Multiple Vitamin (MULTIVITAMIN WITH MINERALS) TABS tablet Take 1 tablet by mouth daily.   Yes [provider]  Omega-3 Fatty Acids (FISH OIL) 1000 MG CAPS Take 1,000 mg by mouth in the morning and at bedtime.   Yes [provider]  pravastatin (PRAVACHOL) 80 MG tablet Take 1 tablet (80 mg total) by mouth every evening. 06/26/23 09/24/23 Yes Hochrein, Fayrene Fearing, MD  sacubitril-valsartan (ENTRESTO) 49-51 MG Take 1 tablet by mouth 2 (two) times daily. 08/14/23  Yes Rollene Rotunda, MD  spironolactone (ALDACTONE) 25 MG tablet Take 1 tablet (25 mg total) by mouth daily. Call patient for office visit [phone] Patient taking differently: Take 12.5 mg by mouth daily. 05/26/18  Yes Laurey Morale, MD  VITAMIN D PO Take 1 tablet by mouth daily.    Yes [provider]  ZINC OXIDE PO Take 500 mg by mouth daily.   Yes [provider]  nitroGLYCERIN (NITROSTAT) 0.4 MG SL tablet Place 1 tablet (0.4 mg total) under the tongue every 5 (five) minutes as needed for chest pain. Patient not taking: Reported on 08/26/2023 07/04/23   Rollene Rotunda, MD    ROS: All other systems have been reviewed and were otherwise negative with the exception of those mentioned in the HPI and as above.  Physical Exam: General: Alert, no acute distress Cardiovascular: No pedal edema Respiratory: No cyanosis, no use of accessory musculature GI: No organomegaly, abdomen is soft and non-tender Skin: No lesions in the area of chief complaint Neurologic: Sensation intact distally Psychiatric: Patient is competent for consent with normal mood and affect Lymphatic: No axillary or cervical lymphadenopathy  MUSCULOSKELETAL:  The range of motion of the bilateral shoulders is to about 110, passive 140, extension 30. Weak cuff strength throughout.  Imaging: X-rays of the bilateral shoulders were ordered and interpreted by me demonstrating end stage cuff tear arthropathy with Julio Alm 5 changes.  BMI: Estimated body mass index is 26.57 kg/m as calculated from the following:   Height as of 09/12/23: 5\' 3"  (1.6 m).   Weight as of 09/12/23: 68 kg.  Lab Results  Component Value Date   ALBUMIN 3.5 08/21/2019   Diabetes: Patient does not have a diagnosis of diabetes.     Smoking Status:       Assessment: RT SHOULDER DJD with RC arthropathy  Plan: Plan for Procedure(s): REVERSE SHOULDER ARTHROPLASTY    The risks benefits and alternatives were discussed with the patient including but not limited to the risks of nonoperative treatment, versus surgical intervention including infection, bleeding, nerve injury,  blood clots, cardiopulmonary complications, morbidity, mortality, among others, and they were willing to proceed.   We additionally  specifically discussed risks of axillary nerve injury, infection, periprosthetic fracture, continued pain and longevity of implants prior to beginning procedure.    Patient will be closely monitored in PACU for medical stabilization  and pain control. If found stable in PACU, patient may be discharged home with outpatient follow-up. If any concerns regarding patient's stabilization patient will be admitted for observation after surgery. The patient is planning to be discharged home with outpatient PT.   The patient acknowledged the explanation, agreed to proceed with the plan and consent was signed.   Operative Plan: right reverse total shoulder arthroplasty  Discharge Medications: *** DVT Prophylaxis: *** Physical Therapy: *** Special Discharge needs: ***   Corinna Capra, PA-C  09/20/2023 1:00 PM

## 2023-09-24 ENCOUNTER — Observation Stay (HOSPITAL_COMMUNITY)
Admission: RE | Admit: 2023-09-24 | Discharge: 2023-09-25 | Disposition: A | Discharge status: Home / Self Care | Payer: PPO | Source: Ambulatory Visit | Attending: Orthopaedic Surgery | Admitting: Orthopaedic Surgery

## 2023-09-24 ENCOUNTER — Encounter (HOSPITAL_COMMUNITY): Payer: Self-pay | Admitting: Orthopaedic Surgery

## 2023-09-24 ENCOUNTER — Encounter (HOSPITAL_COMMUNITY)
Admission: RE | Disposition: A | Discharge status: Home / Self Care | Payer: Self-pay | Source: Ambulatory Visit | Attending: Orthopaedic Surgery

## 2023-09-24 ENCOUNTER — Other Ambulatory Visit: Payer: Self-pay

## 2023-09-24 ENCOUNTER — Ambulatory Visit (HOSPITAL_COMMUNITY): Payer: PPO | Admitting: Medical

## 2023-09-24 ENCOUNTER — Observation Stay (HOSPITAL_COMMUNITY): Payer: PPO

## 2023-09-24 ENCOUNTER — Ambulatory Visit (HOSPITAL_BASED_OUTPATIENT_CLINIC_OR_DEPARTMENT_OTHER): Payer: PPO | Admitting: Anesthesiology

## 2023-09-24 DIAGNOSIS — Z87891 Personal history of nicotine dependence: Secondary | ICD-10-CM | POA: Insufficient documentation

## 2023-09-24 DIAGNOSIS — N1831 Chronic kidney disease, stage 3a: Secondary | ICD-10-CM | POA: Diagnosis not present

## 2023-09-24 DIAGNOSIS — Z85828 Personal history of other malignant neoplasm of skin: Secondary | ICD-10-CM | POA: Diagnosis not present

## 2023-09-24 DIAGNOSIS — Z951 Presence of aortocoronary bypass graft: Secondary | ICD-10-CM | POA: Diagnosis not present

## 2023-09-24 DIAGNOSIS — Z96611 Presence of right artificial shoulder joint: Principal | ICD-10-CM

## 2023-09-24 DIAGNOSIS — M19011 Primary osteoarthritis, right shoulder: Secondary | ICD-10-CM

## 2023-09-24 DIAGNOSIS — I5020 Unspecified systolic (congestive) heart failure: Secondary | ICD-10-CM | POA: Insufficient documentation

## 2023-09-24 DIAGNOSIS — Z7982 Long term (current) use of aspirin: Secondary | ICD-10-CM | POA: Insufficient documentation

## 2023-09-24 DIAGNOSIS — I110 Hypertensive heart disease with heart failure: Secondary | ICD-10-CM | POA: Diagnosis not present

## 2023-09-24 DIAGNOSIS — I2510 Atherosclerotic heart disease of native coronary artery without angina pectoris: Secondary | ICD-10-CM | POA: Insufficient documentation

## 2023-09-24 DIAGNOSIS — Z79899 Other long term (current) drug therapy: Secondary | ICD-10-CM | POA: Diagnosis not present

## 2023-09-24 DIAGNOSIS — Z85118 Personal history of other malignant neoplasm of bronchus and lung: Secondary | ICD-10-CM | POA: Insufficient documentation

## 2023-09-24 DIAGNOSIS — M75101 Unspecified rotator cuff tear or rupture of right shoulder, not specified as traumatic: Principal | ICD-10-CM | POA: Insufficient documentation

## 2023-09-24 DIAGNOSIS — I130 Hypertensive heart and chronic kidney disease with heart failure and stage 1 through stage 4 chronic kidney disease, or unspecified chronic kidney disease: Secondary | ICD-10-CM | POA: Insufficient documentation

## 2023-09-24 DIAGNOSIS — I509 Heart failure, unspecified: Secondary | ICD-10-CM

## 2023-09-24 HISTORY — PX: REVERSE SHOULDER ARTHROPLASTY: SHX5054

## 2023-09-24 SURGERY — ARTHROPLASTY, SHOULDER, TOTAL, REVERSE
Anesthesia: General | Site: Shoulder | Laterality: Right

## 2023-09-24 MED ORDER — FENTANYL CITRATE PF 50 MCG/ML IJ SOSY
50.0000 ug | PREFILLED_SYRINGE | INTRAMUSCULAR | Status: DC
  Administered 2023-09-24: 50 ug via INTRAVENOUS
  Filled 2023-09-24: qty 2

## 2023-09-24 MED ORDER — BUPIVACAINE-EPINEPHRINE (PF) 0.5% -1:200000 IJ SOLN
INTRAMUSCULAR | Status: DC | PRN
Start: 2023-09-24 — End: 2023-09-24
  Administered 2023-09-24: 15 mL via PERINEURAL

## 2023-09-24 MED ORDER — PHENYLEPHRINE 80 MCG/ML (10ML) SYRINGE FOR IV PUSH (FOR BLOOD PRESSURE SUPPORT)
PREFILLED_SYRINGE | INTRAVENOUS | Status: AC
  Filled 2023-09-24: qty 10

## 2023-09-24 MED ORDER — MENTHOL 3 MG MT LOZG: 1.0000 | LOZENGE | OROMUCOSAL | Status: DC | PRN

## 2023-09-24 MED ORDER — METOCLOPRAMIDE HCL 5 MG PO TABS: 5.0000 mg | ORAL_TABLET | Freq: Three times a day (TID) | ORAL | Status: DC | PRN

## 2023-09-24 MED ORDER — SPIRONOLACTONE 12.5 MG HALF TABLET
12.5000 mg | ORAL_TABLET | Freq: Every day | ORAL | Status: DC
  Administered 2023-09-24 – 2023-09-25 (×2): 12.5 mg via ORAL
  Filled 2023-09-24 (×2): qty 1

## 2023-09-24 MED ORDER — PHENYLEPHRINE HCL-NACL 20-0.9 MG/250ML-% IV SOLN
INTRAVENOUS | Status: DC | PRN
Start: 2023-09-24 — End: 2023-09-24
  Administered 2023-09-24: 50 ug/min via INTRAVENOUS

## 2023-09-24 MED ORDER — TRANEXAMIC ACID-NACL 1000-0.7 MG/100ML-% IV SOLN
1000.0000 mg | INTRAVENOUS | Status: AC
  Administered 2023-09-24: 1000 mg via INTRAVENOUS
  Filled 2023-09-24: qty 100

## 2023-09-24 MED ORDER — ONDANSETRON HCL 4 MG/2ML IJ SOLN
INTRAMUSCULAR | Status: DC | PRN
Start: 2023-09-24 — End: 2023-09-24
  Administered 2023-09-24: 4 mg via INTRAVENOUS

## 2023-09-24 MED ORDER — PRAVASTATIN SODIUM 20 MG PO TABS
80.0000 mg | ORAL_TABLET | Freq: Every evening | ORAL | Status: DC
  Administered 2023-09-24: 80 mg via ORAL
  Filled 2023-09-24: qty 4

## 2023-09-24 MED ORDER — FENTANYL CITRATE (PF) 100 MCG/2ML IJ SOLN
INTRAMUSCULAR | Status: AC
  Filled 2023-09-24: qty 2

## 2023-09-24 MED ORDER — VANCOMYCIN HCL 1 G IV SOLR
INTRAVENOUS | Status: DC | PRN
  Administered 2023-09-24: 1000 mg via TOPICAL

## 2023-09-24 MED ORDER — LIDOCAINE 2% (20 MG/ML) 5 ML SYRINGE
INTRAMUSCULAR | Status: DC | PRN
  Administered 2023-09-24: 60 mg via INTRAVENOUS

## 2023-09-24 MED ORDER — NITROGLYCERIN 0.4 MG SL SUBL: 0.4000 mg | SUBLINGUAL_TABLET | SUBLINGUAL | Status: DC | PRN

## 2023-09-24 MED ORDER — EZETIMIBE 10 MG PO TABS
10.0000 mg | ORAL_TABLET | Freq: Every day | ORAL | Status: DC
  Administered 2023-09-25: 10 mg via ORAL
  Filled 2023-09-24: qty 1

## 2023-09-24 MED ORDER — CARVEDILOL 6.25 MG PO TABS
6.2500 mg | ORAL_TABLET | Freq: Two times a day (BID) | ORAL | Status: DC
  Administered 2023-09-25: 6.25 mg via ORAL
  Filled 2023-09-24 (×2): qty 1

## 2023-09-24 MED ORDER — CHLORHEXIDINE GLUCONATE 0.12 % MT SOLN
15.0000 mL | Freq: Once | OROMUCOSAL | Status: AC
  Administered 2023-09-24: 15 mL via OROMUCOSAL

## 2023-09-24 MED ORDER — MAGNESIUM CITRATE PO SOLN: 1.0000 | Freq: Once | ORAL | Status: DC | PRN

## 2023-09-24 MED ORDER — DOCUSATE SODIUM 100 MG PO CAPS
100.0000 mg | ORAL_CAPSULE | Freq: Two times a day (BID) | ORAL | Status: DC
  Administered 2023-09-24 – 2023-09-25 (×3): 100 mg via ORAL
  Filled 2023-09-24 (×3): qty 1

## 2023-09-24 MED ORDER — PROPOFOL 10 MG/ML IV BOLUS
INTRAVENOUS | Status: AC
  Filled 2023-09-24: qty 20

## 2023-09-24 MED ORDER — BISACODYL 10 MG RE SUPP: 10.0000 mg | Freq: Every day | RECTAL | Status: DC | PRN

## 2023-09-24 MED ORDER — ONDANSETRON HCL 4 MG/2ML IJ SOLN: 4.0000 mg | Freq: Four times a day (QID) | INTRAMUSCULAR | Status: DC | PRN

## 2023-09-24 MED ORDER — PHENYLEPHRINE HCL (PRESSORS) 10 MG/ML IV SOLN
INTRAVENOUS | Status: DC | PRN
Start: 2023-09-24 — End: 2023-09-24
  Administered 2023-09-24 (×2): 80 ug via INTRAVENOUS

## 2023-09-24 MED ORDER — ONDANSETRON HCL 4 MG PO TABS: 4.0000 mg | ORAL_TABLET | Freq: Four times a day (QID) | ORAL | Status: DC | PRN

## 2023-09-24 MED ORDER — BUPIVACAINE LIPOSOME 1.3 % IJ SUSP
INTRAMUSCULAR | Status: DC | PRN
  Administered 2023-09-24: 10 mL via PERINEURAL

## 2023-09-24 MED ORDER — SACUBITRIL-VALSARTAN 49-51 MG PO TABS
1.0000 | ORAL_TABLET | Freq: Two times a day (BID) | ORAL | Status: DC
  Administered 2023-09-24 – 2023-09-25 (×3): 1 via ORAL
  Filled 2023-09-24 (×3): qty 1

## 2023-09-24 MED ORDER — OXYCODONE HCL 5 MG PO TABS
5.0000 mg | ORAL_TABLET | Freq: Four times a day (QID) | ORAL | Status: DC | PRN
  Administered 2023-09-25: 5 mg via ORAL
  Filled 2023-09-24: qty 1

## 2023-09-24 MED ORDER — STERILE WATER FOR IRRIGATION IR SOLN
Status: DC | PRN
  Administered 2023-09-24: 2000 mL

## 2023-09-24 MED ORDER — EPHEDRINE SULFATE (PRESSORS) 50 MG/ML IJ SOLN
INTRAMUSCULAR | Status: DC | PRN
Start: 2023-09-24 — End: 2023-09-24
  Administered 2023-09-24: 10 mg via INTRAVENOUS
  Administered 2023-09-24 (×2): 5 mg via INTRAVENOUS

## 2023-09-24 MED ORDER — LACTATED RINGERS IV SOLN: INTRAVENOUS | Status: DC

## 2023-09-24 MED ORDER — SUGAMMADEX SODIUM 200 MG/2ML IV SOLN
INTRAVENOUS | Status: DC | PRN
  Administered 2023-09-24 (×2): 200 mg via INTRAVENOUS

## 2023-09-24 MED ORDER — ORAL CARE MOUTH RINSE: 15.0000 mL | Freq: Once | OROMUCOSAL | Status: AC

## 2023-09-24 MED ORDER — DEXAMETHASONE SODIUM PHOSPHATE 10 MG/ML IJ SOLN
INTRAMUSCULAR | Status: AC
  Filled 2023-09-24: qty 1

## 2023-09-24 MED ORDER — FENTANYL CITRATE (PF) 100 MCG/2ML IJ SOLN
INTRAMUSCULAR | Status: DC | PRN
  Administered 2023-09-24: 50 ug via INTRAVENOUS

## 2023-09-24 MED ORDER — CEFAZOLIN SODIUM-DEXTROSE 2-4 GM/100ML-% IV SOLN
2.0000 g | INTRAVENOUS | Status: AC
  Administered 2023-09-24: 2 g via INTRAVENOUS
  Filled 2023-09-24: qty 100

## 2023-09-24 MED ORDER — SODIUM CHLORIDE 0.9 % IR SOLN
Status: DC | PRN
  Administered 2023-09-24: 1000 mL

## 2023-09-24 MED ORDER — 0.9 % SODIUM CHLORIDE (POUR BTL) OPTIME
TOPICAL | Status: DC | PRN
  Administered 2023-09-24: 1000 mL

## 2023-09-24 MED ORDER — FENTANYL CITRATE PF 50 MCG/ML IJ SOSY: 25.0000 ug | PREFILLED_SYRINGE | INTRAMUSCULAR | Status: DC | PRN

## 2023-09-24 MED ORDER — PROPOFOL 10 MG/ML IV BOLUS
INTRAVENOUS | Status: DC | PRN
  Administered 2023-09-24: 100 mg via INTRAVENOUS

## 2023-09-24 MED ORDER — OXYCODONE HCL 5 MG PO TABS: 5.0000 mg | ORAL_TABLET | Freq: Once | ORAL | Status: DC | PRN

## 2023-09-24 MED ORDER — DEXAMETHASONE SODIUM PHOSPHATE 10 MG/ML IJ SOLN
INTRAMUSCULAR | Status: DC | PRN
  Administered 2023-09-24: 10 mg via INTRAVENOUS

## 2023-09-24 MED ORDER — METOCLOPRAMIDE HCL 5 MG/ML IJ SOLN: 5.0000 mg | Freq: Three times a day (TID) | INTRAMUSCULAR | Status: DC | PRN

## 2023-09-24 MED ORDER — ONDANSETRON HCL 4 MG/2ML IJ SOLN
INTRAMUSCULAR | Status: AC
  Filled 2023-09-24: qty 2

## 2023-09-24 MED ORDER — PHENOL 1.4 % MT LIQD: 1.0000 | OROMUCOSAL | Status: DC | PRN

## 2023-09-24 MED ORDER — VANCOMYCIN HCL 1000 MG IV SOLR
INTRAVENOUS | Status: AC
  Filled 2023-09-24: qty 20

## 2023-09-24 MED ORDER — LIDOCAINE HCL (PF) 2 % IJ SOLN
INTRAMUSCULAR | Status: AC
  Filled 2023-09-24: qty 5

## 2023-09-24 MED ORDER — ACETAMINOPHEN 500 MG PO TABS
1000.0000 mg | ORAL_TABLET | Freq: Once | ORAL | Status: DC
Start: 2023-09-24 — End: 2023-09-24

## 2023-09-24 MED ORDER — POLYETHYLENE GLYCOL 3350 17 G PO PACK: 17.0000 g | PACK | Freq: Every day | ORAL | Status: DC | PRN

## 2023-09-24 MED ORDER — ACETAMINOPHEN 500 MG PO TABS
1000.0000 mg | ORAL_TABLET | Freq: Three times a day (TID) | ORAL | Status: DC
  Administered 2023-09-24 – 2023-09-25 (×3): 1000 mg via ORAL
  Filled 2023-09-24 (×3): qty 2

## 2023-09-24 MED ORDER — EPHEDRINE 5 MG/ML INJ
INTRAVENOUS | Status: AC
  Filled 2023-09-24: qty 5

## 2023-09-24 MED ORDER — ACETAMINOPHEN 500 MG PO TABS
1000.0000 mg | ORAL_TABLET | Freq: Once | ORAL | Status: AC
  Administered 2023-09-24: 1000 mg via ORAL
  Filled 2023-09-24: qty 2

## 2023-09-24 MED ORDER — OXYCODONE HCL 5 MG/5ML PO SOLN: 5.0000 mg | Freq: Once | ORAL | Status: DC | PRN

## 2023-09-24 MED ORDER — ROCURONIUM BROMIDE 10 MG/ML (PF) SYRINGE
PREFILLED_SYRINGE | INTRAVENOUS | Status: DC | PRN
  Administered 2023-09-24: 60 mg via INTRAVENOUS

## 2023-09-24 MED ORDER — HYDROMORPHONE HCL 1 MG/ML IJ SOLN: 0.5000 mg | INTRAMUSCULAR | Status: DC | PRN

## 2023-09-24 MED ORDER — CEFAZOLIN SODIUM-DEXTROSE 1-4 GM/50ML-% IV SOLN
1.0000 g | Freq: Four times a day (QID) | INTRAVENOUS | Status: AC
  Administered 2023-09-24 – 2023-09-25 (×3): 1 g via INTRAVENOUS
  Filled 2023-09-24 (×3): qty 50

## 2023-09-24 MED ORDER — OXYCODONE HCL 5 MG PO TABS: 10.0000 mg | ORAL_TABLET | Freq: Four times a day (QID) | ORAL | Status: DC | PRN

## 2023-09-24 SURGICAL SUPPLY — 67 items
AID PSTN UNV HD RSTRNT DISP (MISCELLANEOUS) ×1
APL PRP STRL LF DISP 70% ISPRP (MISCELLANEOUS) ×2
BAG COUNTER SPONGE SURGICOUNT (BAG) ×1 IMPLANT
BAG SPNG CNTER NS LX DISP (BAG) ×1
BASEPLATE SHOULDER FW 15D 29 (Joint) IMPLANT
BIT DRILL 3.2 PERIPHERAL SCREW (BIT) IMPLANT
BLADE SAW SGTL 73X25 THK (BLADE) ×1 IMPLANT
BSPLAT GLND 15D 29 FULL WDG (Joint) ×1 IMPLANT
CHLORAPREP W/TINT 26 (MISCELLANEOUS) ×2 IMPLANT
CLSR STERI-STRIP ANTIMIC 1/2X4 (GAUZE/BANDAGES/DRESSINGS) ×1 IMPLANT
COOLER ICEMAN CLASSIC (MISCELLANEOUS) IMPLANT
COVER BACK TABLE 60X90IN (DRAPES) ×1 IMPLANT
COVER SURGICAL LIGHT HANDLE (MISCELLANEOUS) ×1 IMPLANT
DRAPE C-ARM 42X120 X-RAY (DRAPES) IMPLANT
DRAPE INCISE IOBAN 66X45 STRL (DRAPES) ×1 IMPLANT
DRAPE ORTHO SPLIT 77X108 STRL (DRAPES) ×2
DRAPE POUCH INSTRU U-SHP 10X18 (DRAPES) ×1 IMPLANT
DRAPE SHEET LG 3/4 BI-LAMINATE (DRAPES) ×2 IMPLANT
DRAPE SURG ORHT 6 SPLT 77X108 (DRAPES) ×2 IMPLANT
DRSG AQUACEL AG ADV 3.5X 6 (GAUZE/BANDAGES/DRESSINGS) ×1 IMPLANT
ELECT BLADE TIP CTD 4 INCH (ELECTRODE) ×1 IMPLANT
ELECT PENCIL ROCKER SW 15FT (MISCELLANEOUS) IMPLANT
ELECT REM PT RETURN 15FT ADLT (MISCELLANEOUS) ×1 IMPLANT
FACESHIELD WRAPAROUND (MASK) ×3
FACESHIELD WRAPAROUND OR TEAM (MASK) ×3 IMPLANT
GLENOSPHERE STANDARD 39 (Joint) ×1 IMPLANT
GLENOSPHERE STD 39 (Joint) IMPLANT
GLOVE BIO SURGEON STRL SZ 6.5 (GLOVE) ×2 IMPLANT
GLOVE BIOGEL PI IND STRL 6.5 (GLOVE) ×1 IMPLANT
GLOVE BIOGEL PI IND STRL 8 (GLOVE) ×1 IMPLANT
GLOVE ECLIPSE 8.0 STRL XLNG CF (GLOVE) ×2 IMPLANT
GOWN STRL REUS W/ TWL LRG LVL3 (GOWN DISPOSABLE) ×2 IMPLANT
GOWN STRL REUS W/TWL LRG LVL3 (GOWN DISPOSABLE) ×2
GUIDE PIN 3X75 SHOULDER (PIN) ×1
GUIDEWIRE GLENOID 2.5X220 (WIRE) IMPLANT
HANDPIECE INTERPULSE COAX TIP (DISPOSABLE) ×1
INSERT HUM PERF 3/4 39 +0 (Insert) IMPLANT
KIT BASIN OR (CUSTOM PROCEDURE TRAY) ×1 IMPLANT
KIT STABILIZATION SHOULDER (MISCELLANEOUS) ×1 IMPLANT
KIT TURNOVER KIT A (KITS) IMPLANT
MANIFOLD NEPTUNE II (INSTRUMENTS) ×1 IMPLANT
NS IRRIG 1000ML POUR BTL (IV SOLUTION) ×1 IMPLANT
PACK SHOULDER (CUSTOM PROCEDURE TRAY) ×1 IMPLANT
PAD COLD SHLDR WRAP-ON (PAD) IMPLANT
PIN GUIDE 3X75 SHOULDER (PIN) IMPLANT
RESTRAINT HEAD UNIVERSAL NS (MISCELLANEOUS) ×1 IMPLANT
SCREW 5.5X22 (Screw) IMPLANT
SCREW 5.5X26 (Screw) IMPLANT
SCREW BONE THREAD 6.5X35 (Screw) IMPLANT
SCREW PERIPHERAL 5.0X34 (Screw) IMPLANT
SET HNDPC FAN SPRY TIP SCT (DISPOSABLE) ×1 IMPLANT
SLING ULTRA III MED (ORTHOPEDIC SUPPLIES) IMPLANT
SPONGE T-LAP 4X18 ~~LOC~~+RFID (SPONGE) ×1 IMPLANT
STEM HUMERAL STD SHORT SZ3 (Joint) IMPLANT
SUCTION TUBE FRAZIER 12FR DISP (SUCTIONS) IMPLANT
SUT ETHIBOND 2 V 37 (SUTURE) ×1 IMPLANT
SUT ETHIBOND NAB CT1 #1 30IN (SUTURE) ×1 IMPLANT
SUT ETHILON 3 0 PS 1 (SUTURE) IMPLANT
SUT FIBERWIRE #5 38 CONV NDL (SUTURE) ×2
SUT MNCRL AB 4-0 PS2 18 (SUTURE) ×1 IMPLANT
SUT VIC AB 0 CT1 36 (SUTURE) IMPLANT
SUT VIC AB 3-0 SH 27 (SUTURE) ×1
SUT VIC AB 3-0 SH 27X BRD (SUTURE) ×1 IMPLANT
SUTURE FIBERWR #5 38 CONV NDL (SUTURE) ×2 IMPLANT
TOWEL OR 17X26 10 PK STRL BLUE (TOWEL DISPOSABLE) ×1 IMPLANT
TUBE SUCTION HIGH CAP CLEAR NV (SUCTIONS) ×1 IMPLANT
WATER STERILE IRR 1000ML POUR (IV SOLUTION) ×2 IMPLANT

## 2023-09-24 NOTE — Op Note (Signed)
Orthopaedic Surgery Operative Note (CSN: [id number])  [name] [name]  1936-04-04 Date of Surgery: 09/24/2023   Diagnoses:  Right end stage cuff tear arthropathy  Procedure: Right reverse augmented  Total Shoulder Arthroplasty   Operative Finding Successful completion of planned procedure.  Reasonable bone quality, no cuff to speak of, tug test not performed due to scarring.  Post-operative plan: The patient will be NWB in sling.  The patient will be will be admitted to observation due to medical complexity, monitoring and pain management.  DVT prophylaxis Aspirin 81 mg twice daily for 6 weeks.  Pain control with PRN pain medication preferring oral medicines.  Follow up plan will be scheduled in approximately 7 days for incision check and XR.  Physical therapy to start immediately.  Implants:Tornier perform humeral size 3, 39 sphere, 29 full wedge, 35 center screw  Post-Op Diagnosis: Same Surgeons:Primary: Bjorn Pippin, MD Assistants:Caroline McBane PA-C Location: [facility] ROOM 08 Anesthesia: General with Exparel Interscalene Antibiotics: Ancef 2g preop, Vancomycin 1000mg  locally Tourniquet time: None Estimated Blood Loss: 100 Complications: None Specimens: None Implants: Implant Name Type Inv. Item Serial No. Manufacturer Lot No. LRB No. Used Action  BSPLAT GLND 15D 29 FULL WDG - [id number] Joint BSPLAT GLND 15D 29 FULL WDG [id number] [employer]  Right 1 Implanted  GLENOSPHERE STANDARD 39 - [id number] Joint GLENOSPHERE STANDARD 39 [id number] [employer]  Right 1 Implanted  SCREW BONE THREAD 6.5X35 - [id number] Screw SCREW BONE THREAD 6.5X35  [employer]  Right 1 Implanted  SCREW PERIPHERAL 5.0X34 - [id number] Screw SCREW PERIPHERAL 5.0X34  [employer]  Right 1 Implanted  SCREW 5.5X22 - [id number] Screw SCREW 5.5X22  [employer]  Right 2 Implanted  SCREW 5.5X26 - [id number] Screw SCREW 5.5X26  [employer]  Right 1 Implanted  STEM HUMERAL STD SHORT SZ3 - [id number] Joint STEM HUMERAL  STD SHORT SZ3 [id number] [employer]  Right 1 Implanted  INSERT HUM PERF 3/4 39 +0 - [id number] Insert INSERT HUM PERF 3/4 39 +0 [id number] [employer]  Right 1 Implanted    Indications for Surgery:   [name] [name] is a 87 y.o. male with right cuff tear arthropathy.  Benefits and risks of operative and nonoperative management were discussed prior to surgery with patient/guardian(s) and informed consent form was completed.  Infection and need for further surgery were discussed as was prosthetic stability and cuff issues.  We additionally specifically discussed risks of axillary nerve injury, infection, periprosthetic fracture, continued pain and longevity of implants prior to beginning procedure.      Procedure:   The patient was identified in the preoperative holding area where the surgical site was marked. Block placed by anesthesia with exparel.  The patient was taken to the OR where a procedural timeout was called and the above noted anesthesia was induced.  The patient was positioned beachchair on allen table with spider arm positioner.  Preoperative antibiotics were dosed.  The patient's right shoulder was prepped and draped in the usual sterile fashion.  A second preoperative timeout was called.       Standard deltopectoral approach was performed with a #10 blade. We dissected down to the subcutaneous tissues and the cephalic vein was taken laterally with the deltoid. Clavipectoral fascia was incised in line with the incision. Deep retractors were placed. The long of the biceps tendon was identified and there was significant tenosynovitis present.  Tenodesis was performed to the pectoralis tendon with #2 Ethibond. The remaining biceps was followed up into the rotator  interval where it was released.   The subscapularis was taken down in a full thickness layer with capsule along the humeral neck extending inferiorly around the humeral head. We continued releasing the capsule directly off of the  osteophytes inferiorly all the way around the corner. This allowed [place] to dislocate the humeral head.   The humeral head had evidence of severe osteoarthritic wear with full-thickness cartilage loss and exposed subchondral bone. There was significant flattening of the humeral head.   The rotator cuff was carefully examined and noted to be irreperably torn.  The decision was confirmed that a reverse total shoulder was indicated for this patient.  There were osteophytes along the inferior humeral neck. The osteophytes were removed with an osteotome and a rongeur.  Osteophytes were removed with a rongeur and an osteotome and the anatomic neck was well visualized.     A humeral cutting guide was used extra medullary with a pin to help control version. The version was set at 20 of retroversion. Humeral osteotomy was performed with an oscillating saw. The head fragment was passed off the back table.  A cut protector plate was placed.  The subscapularis was torn, no tug test to perform due to scarring.  An anterior deltoid retractor was then placed as well as a small Hohmann retractor superiorly.   The glenoid was inspected and had evidence of severe osteoarthritic wear with full-thickness cartilage loss and exposed subchondral bone.   The remaining labrum was removed circumferentially taking great care not to disrupt the posterior capsule.   At this point we felt based on blueprint templating that a full wedge augment was necessary.  We began by using a full wedge guide to place our center pin as was templated.  We had good position of this pin and we proceeded with our starter center drill.  This allowed for [place] to use the 15 degree full wedge reamer obtaining circumferential witness marks and good bone preparation for ingrowth.  At this point we proceeded with our center drill and had an intact vault.  We then drilled our center screw to a length of 35 mm.    We selected a 6.5 mm x 35 mm screw and the  full wedge baseplate which was placed in the same orientation as our reaming.  We double checked that we had good apposition of the base plate to bone and then proceeded to place 3 locking screws and one nonlocking screw as is typical.   Next a 39 mm glenosphere was selected and impacted onto the baseplate. The center screw was tightened.  We turned attention back to the humeral side. The cut protector was removed.  We used the perform humeral sizing block to select the appropriate size which for this patient was a 3.  We then placed our center pin and reamed over it concentrically obtaining appropriate inset.  We then used our lateralizing chisel to prepare the lateral aspect of the humerus.  At that point we selected the appropriate implant trialing a 3.  Using this trial implant we trialed multiple polyethylene sizes settling on a 0 which provided good stability and range of motion without excess soft tissue tension. The offset was dialed in to match the normal anatomy. The shoulder was trialed.  There was good ROM in all planes and the shoulder was stable with no inferior translation.  The real humeral implants were opened after again confirming sizes.  The trial was removed. #5 Fiberwire x4 sutures passed through  the humeral neck for subscap repair. The humeral component was press-fit obtaining a secure fit. The joint was reduced and thoroughly irrigated with pulsatile lavage. Subscap was repaired back with #5 Fiberwire sutures through bone tunnels. Hemostasis was obtained. The deltopectoral interval was reapproximated with #1 Ethibond. The subcutaneous tissues were closed with 2-0 Vicryl and the skin was closed with running monocryl.    The wounds were cleaned and dried and an Aquacel dressing was placed. The drapes taken down. The arm was placed into sling with abduction pillow. Patient was awakened, extubated, and transferred to the recovery room in stable condition. There were no intraoperative  complications. The sponge, needle, and attention counts were correct at the end of the case.     Alfonse Alpers, PA-C, present and scrubbed throughout the case, critical for completion in a timely fashion, and for retraction, instrumentation, closure.

## 2023-09-24 NOTE — Plan of Care (Signed)
  Problem: Activity: Goal: Ability to tolerate increased activity will improve Outcome: Progressing   Problem: Pain Management: Goal: Pain level will decrease with appropriate interventions Outcome: Progressing   Problem: Nutrition: Goal: Adequate nutrition will be maintained Outcome: Progressing   Problem: Safety: Goal: Ability to remain free from injury will improve Outcome: Progressing

## 2023-09-24 NOTE — Anesthesia Procedure Notes (Signed)
Anesthesia Regional Block: Interscalene brachial plexus block   Pre-Anesthetic Checklist: , timeout performed,  Correct Patient, Correct Site, Correct Laterality,  Correct Procedure, Correct Position, site marked,  Risks and benefits discussed,  Pre-op evaluation,  At surgeon's request and post-op pain management  Laterality: Right  Prep: Maximum Sterile Barrier Precautions used, chloraprep       Needles:  Injection technique: Single-shot  Needle Type: Echogenic Stimulator Needle     Needle Length: 4cm  Needle Gauge: 22     Additional Needles:   Procedures:,,,, ultrasound used (permanent image in chart),,    Narrative:  Start time: 09/24/2023 7:44 AM End time: 09/24/2023 7:47 AM Injection made incrementally with aspirations every 5 mL.  Performed by: Personally  Anesthesiologist: Kaylyn Layer, MD  Additional Notes: Risks, benefits, and alternative discussed. Patient gave consent for procedure. Patient prepped and draped in sterile fashion. Sedation administered, patient remains easily responsive to voice. Relevant anatomy identified with ultrasound guidance. Local anesthetic given in 5cc increments with no signs or symptoms of intravascular injection. No pain or paraesthesias with injection. Patient monitored throughout procedure with signs of LAST or immediate complications. Tolerated well. Ultrasound image placed in chart.  [name] Greenhouse, MD

## 2023-09-24 NOTE — Anesthesia Procedure Notes (Signed)
Procedure Name: Intubation Date/Time: 09/24/2023 8:07 AM  Performed by: Kaylyn Layer, MDPre-anesthesia Checklist: Patient identified, Emergency Drugs available, Suction available and Patient being monitored Patient Re-evaluated:Patient Re-evaluated prior to induction Oxygen Delivery Method: Circle System Utilized Preoxygenation: Pre-oxygenation with 100% oxygen Induction Type: IV induction Ventilation: Mask ventilation without difficulty Laryngoscope Size: Miller and 2 Grade View: Grade I Tube type: Oral Tube size: 7.5 mm Number of attempts: 1 Airway Equipment and Method: Stylet Placement Confirmation: ETT inserted through vocal cords under direct vision, positive ETCO2 and breath sounds checked- equal and bilateral Secured at: 21 cm Tube secured with: Tape Dental Injury: Teeth and Oropharynx as per pre-operative assessment

## 2023-09-24 NOTE — Interval H&P Note (Signed)
All questions answered, patient wants to proceed with procedure. ? ?

## 2023-09-24 NOTE — Transfer of Care (Signed)
Immediate Anesthesia Transfer of Care Note  Patient: JAYME BRUMLOW  Procedure(s) Performed: REVERSE SHOULDER ARTHROPLASTY (Right: Shoulder)  Patient Location: PACU  Anesthesia Type:General  Level of Consciousness: awake  Airway & Oxygen Therapy: Patient Spontanous Breathing and Patient connected to nasal cannula oxygen  Post-op Assessment: Report given to RN and Post -op Vital signs reviewed and stable  Post vital signs: Reviewed and stable  Last Vitals:  Vitals Value Taken Time  BP 129/63 09/24/23 0940  Temp    Pulse 70 09/24/23 0943  Resp 16 09/24/23 0943  SpO2 100 % 09/24/23 0943  Vitals shown include unfiled device data.  Last Pain:  Vitals:   09/24/23 0625  TempSrc: Oral  PainSc:          Complications: No notable events documented.

## 2023-09-24 NOTE — Anesthesia Postprocedure Evaluation (Signed)
Anesthesia Post Note  Patient: [name] [name]  Procedure(s) Performed: REVERSE SHOULDER ARTHROPLASTY (Right: Shoulder)     Patient location during evaluation: PACU Anesthesia Type: General Level of consciousness: awake and alert Pain management: pain level controlled Vital Signs Assessment: post-procedure vital signs reviewed and stable Respiratory status: spontaneous breathing, nonlabored ventilation and respiratory function stable Cardiovascular status: blood pressure returned to baseline Postop Assessment: no apparent nausea or vomiting Anesthetic complications: no   No notable events documented.  Last Vitals:  Vitals:   09/24/23 1100 09/24/23 1115  BP: (!) 104/55 (!) 111/52  Pulse: (!) 57 (!) 58  Resp: 13 13  Temp:    SpO2: 99% 100%    Last Pain:  Vitals:   09/24/23 1115  TempSrc:   PainSc: 0-No pain                 Shanda Howells

## 2023-09-25 ENCOUNTER — Telehealth: Payer: Self-pay | Admitting: *Deleted

## 2023-09-25 ENCOUNTER — Encounter (HOSPITAL_COMMUNITY): Payer: Self-pay | Admitting: Orthopaedic Surgery

## 2023-09-25 DIAGNOSIS — M75101 Unspecified rotator cuff tear or rupture of right shoulder, not specified as traumatic: Secondary | ICD-10-CM | POA: Diagnosis not present

## 2023-09-25 MED ORDER — ONDANSETRON HCL 4 MG PO TABS: 4.0000 mg | ORAL_TABLET | Freq: Three times a day (TID) | ORAL | 0 refills | Status: AC | PRN

## 2023-09-25 MED ORDER — ASPIRIN 81 MG PO CHEW: 81.0000 mg | CHEWABLE_TABLET | Freq: Two times a day (BID) | ORAL | 0 refills | Status: AC

## 2023-09-25 MED ORDER — OXYCODONE HCL 5 MG PO TABS
ORAL_TABLET | ORAL | 0 refills | Status: AC
Start: 2023-09-25 — End: 2023-09-29

## 2023-09-25 MED ORDER — ACETAMINOPHEN 500 MG PO TABS: 1000.0000 mg | ORAL_TABLET | Freq: Three times a day (TID) | ORAL | 0 refills | Status: AC

## 2023-09-25 MED ORDER — MELOXICAM 7.5 MG PO TABS: 7.5000 mg | ORAL_TABLET | Freq: Every day | ORAL | 0 refills | Status: AC

## 2023-09-25 MED ORDER — OMEPRAZOLE 20 MG PO CPDR: 20.0000 mg | DELAYED_RELEASE_CAPSULE | Freq: Every day | ORAL | 0 refills | Status: AC

## 2023-09-25 NOTE — Evaluation (Signed)
Occupational Therapy Evaluation Patient Details Name: [name] [name] MRN: [id number] DOB: 1936-02-02 Today's Date: 09/25/2023   History of Present Illness [name] [name] is a 87 yr old male who is s/p a R reverse TSA 09-24-23, due to end stage cuff tear arthroplasty.   Clinical Impression   Pt is s/p R shoulder replacement on 09-24-23. Therapist provided education and instruction to patient and spouse with regards to ROM/exercises, post-op precautions, UE positioning, donning upper extremity clothing, recommendations for bathing while maintaining shoulder precautions, correct use of ice machine, use of ice for pain and edema management, and donning/doffing sling. Patient and spouse verbalized and demonstrated understanding as needed. Patient needed assistance to donn shirt, underwear, pants, socks, and shoes and with instruction provided on compensatory strategies to perform ADLs. Patient to follow up with MD for further therapy needs.         If plan is discharge home, recommend the following: Help with stairs or ramp for entrance;Assist for transportation;A little help with bathing/dressing/bathroom;Assistance with cooking/housework    Functional Status Assessment  Patient has had a recent decline in their functional status and demonstrates the ability to make significant improvements in function in a reasonable and predictable amount of time.  Equipment Recommendations  None recommended by OT    Recommendations for Other Services       Precautions / Restrictions Precautions Precautions: Shoulder Shoulder Interventions: Shoulder sling/immobilizer Precaution Booklet Issued: Yes (comment) Restrictions Weight Bearing Restrictions: Yes RUE Weight Bearing: Non weight bearing Other Position/Activity Restrictions: okay to perform elbow, wrist, and hand ROM; no shoulder ROM. Sling on at all times except ADLs/exercise      Mobility Bed Mobility      General bed mobility comments: pt was  received seated in bedside chair    Transfers Overall transfer level: Needs assistance Equipment used: None Transfers: Sit to/from Stand Sit to Stand: Supervision                  Balance Overall balance assessment: Mild deficits observed, not formally tested              ADL either performed or assessed with clinical judgement                   Pertinent Vitals/Pain Pain Assessment Pain Assessment: No/denies pain        Communication Communication Communication: No apparent difficulties   Cognition Arousal: Alert Behavior During Therapy: WFL for tasks assessed/performed Overall Cognitive Status: Within Functional Limits for tasks assessed           Exercises     Shoulder Instructions Shoulder Instructions Donning/doffing shirt without moving shoulder: Caregiver independent with task Method for sponge bathing under operated UE: Caregiver independent with task Donning/doffing sling/immobilizer: Minimal assistance (with caregiver performing) Correct positioning of sling/immobilizer: Minimal assistance (with caregiver performing) Pendulum exercises (written home exercise program):  (N/A) ROM for elbow, wrist and digits of operated UE: Caregiver independent with task Sling wearing schedule (on at all times/off for ADL's): Caregiver independent with task Proper positioning of operated UE when showering: Caregiver independent with task Dressing change:  (N/A) Positioning of UE while sleeping: Caregiver independent with task    Home Living Family/patient expects to be discharged to:: Private residence Living Arrangements: Spouse/significant other Available Help at Discharge: Family Type of Home: House Home Access: Stairs to enter Secretary/administrator of Steps: 6 Entrance Stairs-Rails: Right Home Layout: Multi-level;Full bath on main level;Able to live on main level with bedroom/bathroom;1/2 bath on main level  Bathroom Shower/Tub: Firefighter: Shower seat;Cane - single point;Wheelchair - Forensic psychologist (2 wheels);Rollator (4 wheels)          Prior Functioning/Environment Prior Level of Function : Independent/Modified Independent;Driving             Mobility Comments: He was independent with ambulation. ADLs Comments: He was independent with ADLs and driving. He shared household cooking and cleaning with his spouse.        OT Problem List: Impaired UE functional use      OT Treatment/Interventions:   N/A      OT Frequency:  N/A       AM-PAC OT "6 Clicks" Daily Activity     Outcome Measure Help from another person eating meals?: None Help from another person taking care of personal grooming?: None Help from another person toileting, which includes using toliet, bedpan, or urinal?: A Little Help from another person bathing (including washing, rinsing, drying)?: A Little Help from another person to put on and taking off regular upper body clothing?: A Little Help from another person to put on and taking off regular lower body clothing?: A Little 6 Click Score: 20   End of Session Equipment Utilized During Treatment:  (N/A) Nurse Communication: Other (comment) (shoulder education completed)  Activity Tolerance: Patient tolerated treatment well Patient left: in chair;with call bell/phone within reach;with family/visitor present  OT Visit Diagnosis: Muscle weakness (generalized) (M62.81)                Time: 8119-1478 OT Time Calculation (min): 42 min Charges:  OT General Charges $OT Visit: 1 Visit OT Evaluation $OT Eval Low Complexity: 1 Low OT Treatments $Self Care/Home Management : 8-22 mins    Reuben Likes, OTR/L 09/25/2023, 4:00 PM

## 2023-09-25 NOTE — Telephone Encounter (Signed)
Informed [name] [name] that Dr. Truett Perna thinks the new 3 mm lung nodule is likely benign. Thinks should repeat CT chest without contrast in February before OV. She understands and agrees.

## 2023-09-25 NOTE — Plan of Care (Signed)
  Problem: Activity: Goal: Ability to tolerate increased activity will improve Outcome: Progressing   Problem: Health Behavior/Discharge Planning: Goal: Ability to manage health-related needs will improve Outcome: Progressing   Problem: Coping: Goal: Level of anxiety will decrease Outcome: Progressing

## 2023-09-25 NOTE — Discharge Instructions (Signed)
Ramond Marrow MD, MPH Alfonse Alpers, PA-C [facility] [address], [address] [phone] (tel)   [phone] (fax)   POST-OPERATIVE INSTRUCTIONS - TOTAL SHOULDER REPLACEMENT    WOUND CARE You may leave the operative dressing in place until your follow-up appointment. KEEP THE INCISIONS CLEAN AND DRY. There may be a small amount of fluid/bleeding leaking at the surgical site. This is normal after surgery.  If it fills with liquid or blood please call us immediately to change it for you. Use the provided ice machine or Ice packs as often as possible for the first 3-4 days, then as needed for pain relief.   Keep a layer of cloth or a shirt between your skin and the cooling unit to prevent frost bite as it can get very cold.  SHOWERING: - You may shower on Post-Op Day #2.  - The dressing is water resistant but do not scrub it as it may start to peel up.   - You may remove the sling for showering - Gently pat the area dry.  - Do not soak the shoulder in water.  - Do not go swimming in the pool or ocean until your incision has completely healed (about 4-6 weeks after surgery) - KEEP THE INCISIONS CLEAN AND DRY.  EXERCISES Wear the sling at all times  You may remove the sling for showering, but keep the arm across the chest or in a secondary sling.    Accidental/Purposeful External Rotation and shoulder flexion (reaching behind you) is to be avoided at all costs for the first month. It is ok to come out of your sling if your are sitting and have assistance for eating.   Do not lift anything heavier than 1 pound until we discuss it further in clinic.  It is normal for your fingers/hand to become more swollen after surgery and discolored from bruising.   This will resolve over the first few weeks usually after surgery. Please continue to ambulate and do not stay sitting or lying for too long.  Perform foot and wrist pumps to assist in circulation.  PHYSICAL  THERAPY - You will begin physical therapy soon after surgery (unless otherwise specified) - Please call to set up an appointment, if you do not already have one  - Let our office if there are any issues with scheduling your therapy  - You have a physical therapy appointment scheduled at [facility] (across the hall from our office) on 9/30  REGIONAL ANESTHESIA (NERVE BLOCKS) The anesthesia team may have performed a nerve block for you this is a great tool used to minimize pain.   The block may start wearing off overnight (between 8-24 hours postop) When the block wears off, your pain may go from nearly zero to the pain you would have had postop without the block. This is an abrupt transition but nothing dangerous is happening.   This can be a challenging period but utilize your as needed pain medications to try and manage this period. We suggest you use the pain medication the first night prior to going to bed, to ease this transition.  You may take an extra dose of narcotic when this happens if needed   POST-OP MEDICATIONS- Multimodal approach to pain control In general your pain will be controlled with a combination of substances.  Prescriptions unless otherwise discussed are electronically sent to your pharmacy.  This is a carefully made plan we use to minimize narcotic use.  Meloxicam - Anti-inflammatory medication taken on a scheduled basis Acetaminophen - Non-narcotic pain medicine taken on a scheduled basis  Oxycodone - This is a strong narcotic, to be used only on an "as needed" basis for SEVERE pain. Aspirin 81mg  - This medicine is used to minimize the risk of blood clots after surgery. Omeprazole - daily medicine to protect your stomach while taking anti-inflammatories.   Zofran -  take as needed for nausea   FOLLOW-UP If you develop a Fever (>101.5), Redness or Drainage from the surgical incision site, please call our office to arrange for an evaluation. Please call the office  to schedule a follow-up appointment for a wound check, 7-10 days post-operatively.  IF YOU HAVE ANY QUESTIONS, PLEASE FEEL FREE TO CALL OUR OFFICE.  HELPFUL INFORMATION  Your arm will be in a sling following surgery. You will be in this sling for the next 4 weeks.   You may be more comfortable sleeping in a semi-seated position the first few nights following surgery.  Keep a pillow propped under the elbow and forearm for comfort.  If you have a recliner type of chair it might be beneficial.  If not that is fine too, but it would be helpful to sleep propped up with pillows behind your operated shoulder as well under your elbow and forearm.  This will reduce pulling on the suture lines.  When dressing, put your operative arm in the sleeve first.  When getting undressed, take your operative arm out last.  Loose fitting, button-down shirts are recommended.  In most states it is against the law to drive while your arm is in a sling. And certainly against the law to drive while taking narcotics.  You may return to work/school in the next couple of days when you feel up to it. Desk work and typing in the sling is fine.  We suggest you use the pain medication the first night prior to going to bed, in order to ease any pain when the anesthesia wears off. You should avoid taking pain medications on an empty stomach as it will make you nauseous.  You should wean off your narcotic medicines as soon as you are able.     Most patients will be off narcotics before their first postop appointment.   Do not drink alcoholic beverages or take illicit drugs when taking pain medications.  Pain medication may make you constipated.  Below are a few solutions to try in this order: Decrease the amount of pain medication if you aren't having pain. Drink lots of decaffeinated fluids. Drink prune juice and/or each dried prunes  If the first 3 don't work start with additional solutions Take Colace - an  over-the-counter stool softener Take Senokot - an over-the-counter laxative Take Miralax - a stronger over-the-counter laxative   Dental Antibiotics:  In most cases prophylactic antibiotics for Dental procdeures after total joint surgery are not necessary.  Exceptions are as follows:  1. History of prior total joint infection  2. Severely immunocompromised (Organ Transplant, cancer chemotherapy, Rheumatoid biologic meds such as Humira)  3. Poorly controlled diabetes (A1C &gt; 8.0, blood glucose over 200)  If you have one of these conditions, contact your surgeon for an antibiotic prescription, prior to your dental procedure.   For more information including helpful videos and documents visit our website:   [email or web address]

## 2023-09-25 NOTE — Plan of Care (Signed)
  Problem: Education: Goal: Knowledge of the prescribed therapeutic regimen will improve Outcome: Adequate for Discharge Goal: Understanding of activity limitations/precautions following surgery will improve Outcome: Adequate for Discharge   Problem: Activity: Goal: Ability to tolerate increased activity will improve 09/25/2023 1408 by Alice Rieger A, LPN Outcome: Adequate for Discharge 09/25/2023 1035 by Alice Rieger A, LPN Outcome: Progressing   Problem: Pain Management: Goal: Pain level will decrease with appropriate interventions Outcome: Adequate for Discharge   Problem: Education: Goal: Knowledge of General Education information will improve Description: Including pain rating scale, medication(s)/side effects and non-pharmacologic comfort measures Outcome: Adequate for Discharge   Problem: Health Behavior/Discharge Planning: Goal: Ability to manage health-related needs will improve 09/25/2023 1408 by Delila Spence, LPN Outcome: Adequate for Discharge 09/25/2023 1035 by Delila Spence, LPN Outcome: Progressing   Problem: Clinical Measurements: Goal: Ability to maintain clinical measurements within normal limits will improve Outcome: Adequate for Discharge Goal: Will remain free from infection Outcome: Adequate for Discharge Goal: Diagnostic test results will improve Outcome: Adequate for Discharge Goal: Respiratory complications will improve Outcome: Adequate for Discharge Goal: Cardiovascular complication will be avoided Outcome: Adequate for Discharge   Problem: Activity: Goal: Risk for activity intolerance will decrease Outcome: Adequate for Discharge   Problem: Nutrition: Goal: Adequate nutrition will be maintained Outcome: Adequate for Discharge   Problem: Coping: Goal: Level of anxiety will decrease 09/25/2023 1408 by Alice Rieger A, LPN Outcome: Adequate for Discharge 09/25/2023 1035 by Alice Rieger A, LPN Outcome: Progressing   Problem:  Elimination: Goal: Will not experience complications related to bowel motility Outcome: Adequate for Discharge   Problem: Pain Managment: Goal: General experience of comfort will improve Outcome: Adequate for Discharge   Problem: Safety: Goal: Ability to remain free from injury will improve Outcome: Adequate for Discharge   Problem: Skin Integrity: Goal: Risk for impaired skin integrity will decrease Outcome: Adequate for Discharge

## 2023-09-25 NOTE — Discharge Summary (Signed)
Patient ID: [name] [name] MRN: [id number] DOB/AGE: [birth year] 87 y.o.  Admit date: 09/24/2023 Discharge date: 09/25/2023  Admission Diagnoses: Right end stage cuff tear arthropathy   Discharge Diagnoses:  Principal Problem:   Status post reverse arthroplasty of right shoulder   Past Medical History:  Diagnosis Date   Arthritis    JOINT PAIN RIGHT HAND   BPH (benign prostatic hyperplasia)    [name]/elevated PSA, prostate biopsy x4 including one saturation biopsy, laser treatment 2/14; NOCTURIA   CAD (coronary artery disease) 12/31/2011   cath 05/2012 showing 80-90% stenosis of a trifurcating diagonal #1, 70-80% stenosis of OM3 and 90% stenosis of distal LCx after OM3 - medical management, [facility]   Cardiomyopathy (HCC)    dilated cardiomyopathy EF 30%, MUGA  EF 42% 08/2012   Carotid artery occlusion    carotid artery bruit   CHF (congestive heart failure) (HCC)    Chronic kidney disease    CKD3a   Heart murmur    as a child   History of kidney stones    History of shingles    Hyperlipidemia    statin intolerant   Hypertension    Lung cancer (HCC) 2013   Left upper lobectomy   Lung mass    Stage 1B non-small cell lung CA s/p resection 2013   PVC (premature ventricular contraction)    Shingles    Sigmoid diverticulosis    Skin cancer    Multiple skin cancers    Squamous cell carcinoma, face    history of right face with mets to right upper cheek in 2004  and facial lymph node reoccurence post surgery with XRT   Trigger finger    Bilateral   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  Tolerated procedure well.  Was kept for monitoring overnight for pain control and medical monitoring postop and was found to be stable for DC home the morning after surgery.  Patient was instructed on specific  activity restrictions and all questions were answered.   Consults: None  Significant Diagnostic Studies: PACU XR of R shoulder   Treatments: Surgery  Discharge Exam: Physical Exam Constitutional:      Appearance: Normal appearance.  HENT:     Head: Normocephalic and atraumatic.     Nose: Nose normal.     Comments: Nasal cannula Eyes:     Extraocular Movements: Extraocular movements intact.  Cardiovascular:     Pulses: Normal pulses.  Pulmonary:     Effort: Pulmonary effort is normal. No respiratory distress.  Abdominal:     Palpations: Abdomen is soft.  Musculoskeletal:        General: Normal range of motion.     Cervical back: Normal range of motion and neck supple.     Comments: RUE placed in sling. Aquacel to anterior deltoid C/D/I. RUE with normal motor function distally, some reduced sensation to light touch over dorsal hand. Radial, medial, ulnar function intact. Sensation over deltoid intact. ROM and strength of RUE not tested in early post operative period. RUE warm and well perfused, radial pulse 2+ and equal.    LUE: freely moveable LUE without obvious abnormality RLE/LLE: freel moveable lower extremities. Marland Kitchen SCDs placed to bilateral lower legs. ROM of ankle and feet intact. Warm and well perfused. No significant lower extremity edema   Skin:    General:  Skin is warm and dry.     Capillary Refill: Capillary refill takes 2 to 3 seconds.  Neurological:     General: No focal deficit present.     Mental Status: He is alert.  Psychiatric:        Mood and Affect: Mood normal.   Disposition: Discharge disposition: 01-Home or Self Care      Home today Discharge Instructions     Call MD for:  redness, tenderness, or signs of infection (pain, swelling, redness, odor or green/yellow discharge around incision site)   Complete by: As directed    Call MD for:  severe uncontrolled pain   Complete by: As directed    Call MD for:  temperature >100.4   Complete by: As  directed       Allergies as of 09/25/2023       Reactions   Ciprofloxacin Other (See Comments)   Patient took longer to process questions you ask him    Crestor [rosuvastatin] Other (See Comments)   Ramipril Cough   Statins    Confusion Muscle pain        Medication List     STOP taking these medications    aspirin 81 MG tablet Replaced by: aspirin 81 MG chewable tablet       TAKE these medications    acetaminophen 500 MG tablet Commonly known as: TYLENOL Take 2 tablets (1,000 mg total) by mouth every 8 (eight) hours for 14 days.   aspirin 81 MG chewable tablet Commonly known as: Aspirin Childrens Chew 1 tablet (81 mg total) by mouth 2 (two) times daily. For DVT prophylaxis after surgery Replaces: aspirin 81 MG tablet   carvedilol 6.25 MG tablet Commonly known as: COREG TAKE 1 TABLET (6.25 MG TOTAL) BY MOUTH 2 (TWO) TIMES DAILY. NEEDS OFFICE VISIT [phone]   Entresto 49-51 MG Generic drug: sacubitril-valsartan Take 1 tablet by mouth 2 (two) times daily.   ezetimibe 10 MG tablet Commonly known as: ZETIA Take 1 tablet (10 mg total) by mouth daily.   Fish Oil 1000 MG Caps Take 1,000 mg by mouth in the morning and at bedtime.   Flaxseed Oil 1400 MG Caps Take 1,400 mg by mouth daily.   Garlic 1000 MG Caps Take 1,000 mg by mouth 2 (two) times daily.   meloxicam 7.5 MG tablet Commonly known as: Mobic Take 1 tablet (7.5 mg total) by mouth daily.   multivitamin with minerals Tabs tablet Take 1 tablet by mouth daily.   nitroGLYCERIN 0.4 MG SL tablet Commonly known as: NITROSTAT Place 1 tablet (0.4 mg total) under the tongue every 5 (five) minutes as needed for chest pain.   omeprazole 20 MG capsule Commonly known as: PRILOSEC Take 1 capsule (20 mg total) by mouth daily. To gastric protection while taking NSAIDs   ondansetron 4 MG tablet Commonly known as: Zofran Take 1 tablet (4 mg total) by mouth every 8 (eight) hours as needed for up to 7 days  for nausea or vomiting.   oxyCODONE 5 MG immediate release tablet Commonly known as: Oxy IR/ROXICODONE Take 1-2 pills every 6 hrs as needed for severe pain, no more than 6 per day   pravastatin 80 MG tablet Commonly known as: PRAVACHOL Take 1 tablet (80 mg total) by mouth every evening.   spironolactone 25 MG tablet Commonly known as: ALDACTONE Take 1 tablet (25 mg total) by mouth daily. Call patient for office visit [phone] What changed:  how much to take additional instructions  vitamin C 1000 MG tablet Take 1,000 mg by mouth daily.   VITAMIN D PO Take 1 tablet by mouth daily.   ZINC OXIDE PO Take 500 mg by mouth daily.

## 2023-09-25 NOTE — Care Management Obs Status (Signed)
MEDICARE OBSERVATION STATUS NOTIFICATION   Patient Details  Name: [name] [name] MRN: [id number] Date of Birth: [birth year]   Medicare Observation Status Notification Given:  Yes    Ewing Schlein, LCSW 09/25/2023, 11:06 AM

## 2023-09-25 NOTE — Progress Notes (Signed)
   09/25/23 0848  TOC Brief Assessment  Insurance and Status Reviewed  Patient has primary care physician Yes  Home environment has been reviewed Resides with spouse  Prior level of function: Independent at baseline  Prior/Current Home Services No current home services  Social Determinants of Health Reivew SDOH reviewed no interventions necessary  Readmission risk has been reviewed Yes  Transition of care needs no transition of care needs at this time

## 2023-09-25 NOTE — Plan of Care (Signed)
  Problem: Education: Goal: Knowledge of the prescribed therapeutic regimen will improve Outcome: Adequate for Discharge Goal: Understanding of activity limitations/precautions following surgery will improve Outcome: Progressing Goal: Individualized Educational Video(s) Outcome: Completed/Met   Problem: Activity: Goal: Ability to tolerate increased activity will improve Outcome: Progressing   Problem: Pain Management: Goal: Pain level will decrease with appropriate interventions Outcome: Adequate for Discharge   Problem: Education: Goal: Knowledge of General Education information will improve Description: Including pain rating scale, medication(s)/side effects and non-pharmacologic comfort measures Outcome: Progressing   Problem: Health Behavior/Discharge Planning: Goal: Ability to manage health-related needs will improve Outcome: Progressing   Problem: Clinical Measurements: Goal: Ability to maintain clinical measurements within normal limits will improve Outcome: Progressing Goal: Will remain free from infection Outcome: Progressing Goal: Diagnostic test results will improve Outcome: Progressing Goal: Respiratory complications will improve Outcome: Progressing Goal: Cardiovascular complication will be avoided Outcome: Progressing   Problem: Activity: Goal: Risk for activity intolerance will decrease Outcome: Adequate for Discharge   Problem: Nutrition: Goal: Adequate nutrition will be maintained Outcome: Progressing   Problem: Coping: Goal: Level of anxiety will decrease Outcome: Progressing   Problem: Elimination: Goal: Will not experience complications related to bowel motility Outcome: Progressing Goal: Will not experience complications related to urinary retention Outcome: Completed/Met   Problem: Pain Managment: Goal: General experience of comfort will improve Outcome: Progressing   Problem: Safety: Goal: Ability to remain free from injury will  improve Outcome: Progressing   Problem: Skin Integrity: Goal: Risk for impaired skin integrity will decrease Outcome: Progressing

## 2023-09-25 NOTE — Progress Notes (Signed)
   ORTHOPAEDIC PROGRESS NOTE  s/p Procedure(s): REVERSE SHOULDER ARTHROPLASTY  SUBJECTIVE: Patient still having good pain control with block, but is able to freely move hands. States he has no significant pain at this time. Denies SOB, CP, N/V, fevers, chills. Slight discomfort around neck from sling.   OBJECTIVE: PE: Physical Exam Constitutional:      Appearance: Normal appearance.  HENT:     Head: Normocephalic and atraumatic.     Nose: Nose normal.     Comments: Nasal cannula Eyes:     Extraocular Movements: Extraocular movements intact.  Cardiovascular:     Pulses: Normal pulses.  Pulmonary:     Effort: Pulmonary effort is normal. No respiratory distress.  Abdominal:     Palpations: Abdomen is soft.  Musculoskeletal:        General: Normal range of motion.     Cervical back: Normal range of motion and neck supple.     Comments: RUE placed in sling. Aquacel to anterior deltoid C/D/I. RUE with normal motor function distally, some reduced sensation to light touch over dorsal hand. Radial, medial, ulnar function intact. Sensation over deltoid intact. ROM and strength of RUE not tested in early post operative period. RUE warm and well perfused, radial pulse 2+ and equal.   LUE: freely moveable LUE without obvious abnormality RLE/LLE: freel moveable lower extremities. Marland Kitchen SCDs placed to bilateral lower legs. ROM of ankle and feet intact. Warm and well perfused. No significant lower extremity edema   Skin:    General: Skin is warm and dry.     Capillary Refill: Capillary refill takes 2 to 3 seconds.  Neurological:     General: No focal deficit present.     Mental Status: He is alert.  Psychiatric:        Mood and Affect: Mood normal.      Vitals:   09/25/23 0155 09/25/23 0527  BP: 110/62 113/60  Pulse: 73 71  Resp: 17 16  Temp: (!) 97.4 F (36.3 C) (!) 97.5 F (36.4 C)  SpO2: 97% 99%     ASSESSMENT: [name] [name] is a 87 y.o. male doing well  postoperatively.  PLAN: Weightbearing: NWB RUE Insicional and dressing care: Reinforce dressings as needed Orthopedic device(s): None Showering: No showering until POD 3 VTE prophylaxis: Aspirin 81mg  BID  6 weeks Pain control: Oxycodone, tylenol, Mobic  Follow - up plan: 2 weeks for suture removal and post op XR Contact information:  Dr. Everardo Pacific, MD or MD on call

## 2023-10-15 LAB — HEPATIC FUNCTION PANEL
ALT: 20 [IU]/L (ref 0–44)
AST: 13 [IU]/L (ref 0–40)
Albumin: 3.7 g/dL (ref 3.7–4.7)
Alkaline Phosphatase: 93 [IU]/L (ref 44–121)
Bilirubin Total: 0.5 mg/dL (ref 0.0–1.2)
Bilirubin, Direct: 0.17 mg/dL (ref 0.00–0.40)
Total Protein: 6.2 g/dL (ref 6.0–8.5)

## 2023-10-15 LAB — BASIC METABOLIC PANEL WITH GFR
BUN/Creatinine Ratio: 23 (ref 10–24)
BUN: 22 mg/dL (ref 8–27)
CO2: 26 mmol/L (ref 20–29)
Calcium: 9.1 mg/dL (ref 8.6–10.2)
Chloride: 104 mmol/L (ref 96–106)
Creatinine, Ser: 0.97 mg/dL (ref 0.76–1.27)
Glucose: 112 mg/dL — ABNORMAL HIGH (ref 70–99)
Potassium: 4.1 mmol/L (ref 3.5–5.2)
Sodium: 141 mmol/L (ref 134–144)
eGFR: 77 mL/min/{1.73_m2}

## 2023-10-15 LAB — LIPID PANEL
Chol/HDL Ratio: 2.8 {ratio} (ref 0.0–5.0)
Cholesterol, Total: 139 mg/dL (ref 100–199)
HDL: 50 mg/dL (ref >39)
LDL Chol Calc (NIH): 72 mg/dL (ref 0–99)
Triglycerides: 91 mg/dL (ref 0–149)
VLDL Cholesterol Cal: 17 mg/dL (ref 5–40)

## 2023-11-07 ENCOUNTER — Telehealth: Payer: Self-pay | Admitting: *Deleted

## 2023-11-07 NOTE — Telephone Encounter (Signed)
Mr. Presutti called concerned about the lung nodules seen on CT and wants to see MD in November instead of waiting till February. He is very nervous about the issue. Scheduling message sent.

## 2023-11-13 NOTE — Progress Notes (Signed)
Cardiology Office Note:   Date:  11/14/2023  ID:  [name] [name], DOB [birth year], MRN [id number] PCP: Georgann Housekeeper, MD  [facility] Providers Cardiologist:  Rollene Rotunda, MD {  History of Present Illness:   [name] [name] is a 87 y.o. male who presents for evaluation of coronary artery disease.  He was previously seen by Dr. Shirlee Latch in 2018.  He has CAD/CABG.  He had an EF of 45%.   He also saw a cardiologist at [facility] who has retired.         Bypass surgery was in 2013.  At the time of his CABG his TEE suggested his EF to be 15%.  However the most recent was as above in 2017.  He has also had a history of non-small cell lung cancer with left upper lobectomy.  He has a small aortic dilatation of 42 cm.  He has had follow-up CT scan in February of 2024.  He had a follow up echo I reviewed his pulmonary looks like ejection fraction that demonstrated an EF of 40%.  He had some moderate carotid stenosis.     He returns for follow-up. He had shoulder surgery since I last saw him.  He has done well.  He is in physical therapy. The patient denies any new symptoms such as chest discomfort, neck or arm discomfort. There has been no new shortness of breath, PND or orthopnea. There have been no reported palpitations, presyncope or syncope.   ROS: As stated in the HPI and negative for all other systems.  Studies Reviewed:    EKG:   EKG Interpretation Date/Time:  Friday November 14 2023 09:32:18 EST Ventricular Rate:  71 PR Interval:  216 QRS Duration:  118 QT Interval:  436 QTC Calculation: 473 R Axis:   -16  Text Interpretation: Sinus rhythm with 1st degree A-V block with frequent Premature ventricular complexes Non-specific intra-ventricular conduction delay Minimal voltage criteria for LVH, may be normal variant ( R in aVL ) Nonspecific T wave abnormality When compared with ECG of 21-Aug-2019 20:34, No significant change since last tracing Confirmed by Rollene Rotunda ([id number]) on  11/14/2023 9:46:09 AM   Risk Assessment/Calculations:              Physical Exam:   VS:  BP (!) 86/52   Pulse (!) 37   Ht 5\' 4"  (1.626 m)   Wt 156 lb 3.2 oz (70.9 kg)   SpO2 97%   BMI 26.81 kg/m    Wt Readings from Last 3 Encounters:  11/14/23 156 lb 3.2 oz (70.9 kg)  09/24/23 150 lb (68 kg)  09/12/23 150 lb (68 kg)     GEN: Well nourished, well developed in no acute distress NECK: No JVD; No carotid bruits CARDIAC: RRR, soft apical systolic murmur, no diastolic murmurs, rubs, gallops RESPIRATORY:  Clear to auscultation without rales, wheezing or rhonchi  ABDOMEN: Soft, non-tender, non-distended EXTREMITIES:  No edema; No deformity   ASSESSMENT AND PLAN:   CAD: s/p CABG:  The patient has no new sypmtoms.  No further cardiovascular testing is indicated.  We will continue with aggressive risk reduction and meds as listed.   Hyperlipidemia: LDL was 72.  He is tolerating current statin timeline leading him on this.    Chronic systolic CHF:   He has a mildly reduced ejection fraction but he seems to be euvolemic.  His blood pressure would not tolerate further med titration.  No change in therapy.  He does feel  okay with his systolics in the 80s.  I have   CKD:    Creatinine 0.97 in October.  No change in therapy.     Follow up with me in 1 year.  Signed, Rollene Rotunda, MD

## 2023-11-14 ENCOUNTER — Encounter: Payer: Self-pay | Admitting: Cardiology

## 2023-11-14 ENCOUNTER — Inpatient Hospital Stay: Payer: PPO | Attending: Oncology | Admitting: Oncology

## 2023-11-14 ENCOUNTER — Ambulatory Visit: Payer: PPO | Attending: Cardiology | Admitting: Cardiology

## 2023-11-14 VITALS — BP 90/62 | HR 60 | Temp 98.1°F | Resp 18 | Ht 64.0 in | Wt 156.3 lb

## 2023-11-14 VITALS — BP 86/52 | HR 37 | Ht 64.0 in | Wt 156.2 lb

## 2023-11-14 DIAGNOSIS — Z951 Presence of aortocoronary bypass graft: Secondary | ICD-10-CM | POA: Insufficient documentation

## 2023-11-14 DIAGNOSIS — R001 Bradycardia, unspecified: Secondary | ICD-10-CM

## 2023-11-14 DIAGNOSIS — I2510 Atherosclerotic heart disease of native coronary artery without angina pectoris: Secondary | ICD-10-CM | POA: Diagnosis not present

## 2023-11-14 DIAGNOSIS — C3412 Malignant neoplasm of upper lobe, left bronchus or lung: Secondary | ICD-10-CM | POA: Diagnosis not present

## 2023-11-14 DIAGNOSIS — I5022 Chronic systolic (congestive) heart failure: Secondary | ICD-10-CM

## 2023-11-14 DIAGNOSIS — R911 Solitary pulmonary nodule: Secondary | ICD-10-CM | POA: Diagnosis not present

## 2023-11-14 DIAGNOSIS — I25110 Atherosclerotic heart disease of native coronary artery with unstable angina pectoris: Secondary | ICD-10-CM

## 2023-11-14 DIAGNOSIS — E785 Hyperlipidemia, unspecified: Secondary | ICD-10-CM

## 2023-11-14 DIAGNOSIS — Z85118 Personal history of other malignant neoplasm of bronchus and lung: Secondary | ICD-10-CM | POA: Insufficient documentation

## 2023-11-14 DIAGNOSIS — Z85828 Personal history of other malignant neoplasm of skin: Secondary | ICD-10-CM | POA: Diagnosis not present

## 2023-11-14 MED ORDER — ASPIRIN 81 MG PO TBEC: 81.0000 mg | DELAYED_RELEASE_TABLET | Freq: Every day | ORAL | Status: DC

## 2023-11-14 NOTE — Patient Instructions (Addendum)
Medication Instructions:  Your physician has recommended you make the following change in your medication:  1-START Aspirin 81 mg by mouth daily.  *If you need a refill on your cardiac medications before your next appointment, please call your pharmacy*  Lab Work: If you have labs (blood work) drawn today and your tests are completely normal, you will receive your results only by: MyChart Message (if you have MyChart) OR A paper copy in the mail If you have any lab test that is abnormal or we need to change your treatment, we will call you to review the results.  Testing/Procedures: None ordered today  Follow-Up: At [facility], you and your health needs are our priority.  As part of our continuing mission to provide you with exceptional heart care, we have created designated Provider Care Teams.  These Care Teams include your primary Cardiologist (physician) and Advanced Practice Providers (APPs -  Physician Assistants and Nurse Practitioners) who all work together to provide you with the care you need, when you need it.  We recommend signing up for the patient portal called "MyChart".  Sign up information is provided on this After Visit Summary.  MyChart is used to connect with patients for Virtual Visits (Telemedicine).  Patients are able to view lab/test results, encounter notes, upcoming appointments, etc.  Non-urgent messages can be sent to your provider as well.   To learn more about what you can do with MyChart, go to [email or web address].    Your next appointment:   1 year(s)  Provider:   Rollene Rotunda, MD

## 2023-11-14 NOTE — Progress Notes (Signed)
[facility] OFFICE PROGRESS NOTE   Diagnosis: Non-small cell lung cancer  INTERVAL HISTORY:   [name] [name] returns as scheduled.  He feels well.  Good appetite.  He saw Dr. Dorris Fetch in August.  A chest CT revealed a new 3 mm right upper lung nodule.  Objective:  Vital signs in last 24 hours:  Blood pressure 90/62, pulse 60, temperature 98.1 F (36.7 C), temperature source Oral, resp. rate 18, height 5\' 4"  (1.626 m), weight 156 lb 4.8 oz (70.9 kg), SpO2 97%.    HEENT: Neck without mass Lymphatics: No cervical, supraclavicular, axillary, or inguinal nodes Resp: Lungs clear bilaterally Cardio: Regular rate and rhythm GI: No hepatosplenomegaly Vascular: No leg edema  Skin: Face and left chest scars without evidence of recurrent tumor  Lab Results:  Lab Results  Component Value Date   WBC 7.1 09/12/2023   HGB 12.3 (L) 09/12/2023   HCT 38.9 (L) 09/12/2023   MCV 96.5 09/12/2023   PLT 192 09/12/2023   NEUTROABS 4.8 08/21/2019    CMP  Lab Results  Component Value Date   NA 141 10/15/2023   K 4.1 10/15/2023   CL 104 10/15/2023   CO2 26 10/15/2023   GLUCOSE 112 (H) 10/15/2023   BUN 22 10/15/2023   CREATININE 0.97 10/15/2023   CALCIUM 9.1 10/15/2023   PROT 6.2 10/15/2023   ALBUMIN 3.7 10/15/2023   AST 13 10/15/2023   ALT 20 10/15/2023   ALKPHOS 93 10/15/2023   BILITOT 0.5 10/15/2023   GFRNONAA >60 09/12/2023   GFRAA 52 (L) 08/21/2019    No results found for: "CEA1", "CEA", "ZOX096", "CA125"  Lab Results  Component Value Date   INR 1.50 (H) 07/19/2016   LABPROT 18.2 (H) 07/19/2016     Medications: I have reviewed the patient's current medications.   Assessment/Plan:  1.Squamous cell carcinoma of the right cheek, status post Mohs surgery in 2004   2. Local recurrence of squamous cell carcinoma and a right face lymph node in 2006, status post surgical excision followed by radiation   3. Left hilar fullness on a chest x-ray  02/26/2012-stable compared to a chest x-ray from February of 2012 and new compared to a chest x-ray 2007 . A chest CT on 05/05/2012 confirmed a left upper lung nodule consistent with a primary bronchogenic carcinoma. A PET scan on 05/18/12 confirmed a hypermetabolic left upper lung nodule with no evidence of thoracic nodal or extrathoracic hypermetabolic disease   -He is status post a left upper lobectomy on 06/16/2012 with the pathology confirming a 3.8 cm squamous cell carcinoma (T2a, N0)   -Restaging CT 06/16/2014 without evidence of recurrent disease   -Restaging CT 12/15/2014 without evidence of recurrent disease -Restaging CT 12/06/2015-no evidence of recurrent disease -Restaging chest CT 01/02/2017-no evidence of recurrent disease -Restaging chest CT 01/28/2018-no evidence of recurrent disease -Restaging chest CT 01/28/2019-no evidence of recurrent disease -Restaging chest CT 02/03/2020-no evidence of recurrent disease -Restaging chest CT 02/08/2021-no evidence of recurrent disease -CT abdomen/pelvis at [facility] 01/30/2022-no suspicious lung nodules, prominent right inguinal node favored reactive,  -Restaging chest CT 08/15/2022-no evidence of recurrent disease -Restaging chest CT 02/12/2023-no evidence of recurrent disease -Chest CT 08/21/2023-new 3 mm right upper lobe nodule, stable 4 mm right lower lobe nodule  3. diffuse wall thickening of the bladder, 4. History of BPH , status post a laser vaporization of the prostate in February 2014 and TUR August 2015   5. History of multiple skin cancers including basal cell carcinoma  and squamous cell carcinomas   6. coronary artery disease, decreased left ventricular ejection , Status post coronary artery bypass surgery July 2017      Disposition: [name] [name] remains in clinical remission from lung cancer and skin cancer.  He continues skin follow-up with Dr. Yetta Barre.  He would like to alternate 63-month follow-up with myself and Dr. Dorris Fetch.   The tiny lung nodule noted on the CT 08/21/2023 is likely a benign finding.  He would like to continue imaging follow-up.  He will be scheduled for an office visit and repeat chest CT in February.  Thornton Papas, MD  11/14/2023  11:32 AM

## 2024-01-02 ENCOUNTER — Telehealth: Payer: Self-pay | Admitting: *Deleted

## 2024-01-02 NOTE — Telephone Encounter (Signed)
 Patient called to confirm his February appointments. CT and OV appointments reviewed.

## 2024-02-09 DIAGNOSIS — N360 Urethral fistula: Secondary | ICD-10-CM | POA: Diagnosis not present

## 2024-02-11 ENCOUNTER — Ambulatory Visit: Payer: PPO | Admitting: Oncology

## 2024-02-16 ENCOUNTER — Ambulatory Visit (HOSPITAL_BASED_OUTPATIENT_CLINIC_OR_DEPARTMENT_OTHER)
Admission: RE | Admit: 2024-02-16 | Discharge: 2024-02-16 | Disposition: A | Discharge status: Home / Self Care | Payer: HMO | Source: Ambulatory Visit | Attending: Oncology | Admitting: Oncology

## 2024-02-16 DIAGNOSIS — C3412 Malignant neoplasm of upper lobe, left bronchus or lung: Secondary | ICD-10-CM | POA: Insufficient documentation

## 2024-02-23 ENCOUNTER — Inpatient Hospital Stay: Payer: HMO | Attending: Oncology | Admitting: Oncology

## 2024-02-23 VITALS — BP 100/60 | HR 63 | Temp 98.2°F | Resp 18 | Ht 64.0 in | Wt 159.0 lb

## 2024-02-23 DIAGNOSIS — C3412 Malignant neoplasm of upper lobe, left bronchus or lung: Secondary | ICD-10-CM | POA: Insufficient documentation

## 2024-02-23 DIAGNOSIS — Z85828 Personal history of other malignant neoplasm of skin: Secondary | ICD-10-CM | POA: Insufficient documentation

## 2024-02-23 DIAGNOSIS — R918 Other nonspecific abnormal finding of lung field: Secondary | ICD-10-CM | POA: Diagnosis not present

## 2024-02-23 DIAGNOSIS — Z951 Presence of aortocoronary bypass graft: Secondary | ICD-10-CM | POA: Diagnosis not present

## 2024-02-23 DIAGNOSIS — I2510 Atherosclerotic heart disease of native coronary artery without angina pectoris: Secondary | ICD-10-CM | POA: Insufficient documentation

## 2024-02-23 NOTE — Progress Notes (Signed)
 [facility] OFFICE PROGRESS NOTE   Diagnosis: Non-small cell lung cancer  INTERVAL HISTORY:   [name] returns as scheduled.  He feels well.  He underwent right shoulder replacement surgery in September.  He is planning to have left shoulder surgery in a few months.  He continues follow-up with urology for exchange of the suprapubic catheter.  He has a "leak "from the scrotum and is being evaluated at [facility] for an infection or fistula.    Objective:  Vital signs in last 24 hours:  Blood pressure 100/60, pulse 63, temperature 98.2 F (36.8 C), temperature source Temporal, resp. rate 18, height 5\' 4"  (1.626 m), weight 159 lb (72.1 kg), SpO2 100%.    HEENT: Neck without mass Lymphatics: No cervical, supraclavicular, axillary, or inguinal nodes Resp: End inspiratory/expiratory rales at the left upper posterior chest, no respiratory distress Cardio: Regular rate and rhythm GI: No hepatosplenomegaly, suprapubic catheter, no mass Vascular: No leg edema  Skin: Right face scar without evidence of recurrent tumor   Lab Results:  Lab Results  Component Value Date   WBC 7.1 09/12/2023   HGB 12.3 (L) 09/12/2023   HCT 38.9 (L) 09/12/2023   MCV 96.5 09/12/2023   PLT 192 09/12/2023   NEUTROABS 4.8 08/21/2019    CMP  Lab Results  Component Value Date   NA 141 10/15/2023   K 4.1 10/15/2023   CL 104 10/15/2023   CO2 26 10/15/2023   GLUCOSE 112 (H) 10/15/2023   BUN 22 10/15/2023   CREATININE 0.97 10/15/2023   CALCIUM 9.1 10/15/2023   PROT 6.2 10/15/2023   ALBUMIN 3.7 10/15/2023   AST 13 10/15/2023   ALT 20 10/15/2023   ALKPHOS 93 10/15/2023   BILITOT 0.5 10/15/2023   GFRNONAA >60 09/12/2023   GFRAA 52 (L) 08/21/2019    No results found for: "CEA1", "CEA", "WUJ811", "CA125"  Lab Results  Component Value Date   INR 1.50 (H) 07/19/2016   LABPROT 18.2 (H) 07/19/2016    Imaging:  No results found.  Medications: I have reviewed the patient's current  medications.   Assessment/Plan: Squamous cell carcinoma of the right cheek, status post Mohs surgery in 2004   2. Local recurrence of squamous cell carcinoma and a right face lymph node in 2006, status post surgical excision followed by radiation   3. Left hilar fullness on a chest x-ray 02/26/2012-stable compared to a chest x-ray from February of 2012 and new compared to a chest x-ray 2007 . A chest CT on 05/05/2012 confirmed a left upper lung nodule consistent with a primary bronchogenic carcinoma. A PET scan on 05/18/12 confirmed a hypermetabolic left upper lung nodule with no evidence of thoracic nodal or extrathoracic hypermetabolic disease   -He is status post a left upper lobectomy on 06/16/2012 with the pathology confirming a 3.8 cm squamous cell carcinoma (T2a, N0)   -Restaging CT 06/16/2014 without evidence of recurrent disease   -Restaging CT 12/15/2014 without evidence of recurrent disease -Restaging CT 12/06/2015-no evidence of recurrent disease -Restaging chest CT 01/02/2017-no evidence of recurrent disease -Restaging chest CT 01/28/2018-no evidence of recurrent disease -Restaging chest CT 01/28/2019-no evidence of recurrent disease -Restaging chest CT 02/03/2020-no evidence of recurrent disease -Restaging chest CT 02/08/2021-no evidence of recurrent disease -CT abdomen/pelvis at [facility] 01/30/2022-no suspicious lung nodules, prominent right inguinal node favored reactive,  -Restaging chest CT 08/15/2022-no evidence of recurrent disease -Restaging chest CT 02/12/2023-no evidence of recurrent disease -Chest CT 08/21/2023-new 3 mm right upper lobe nodule, stable  4 mm right lower lobe nodule -CT chest 02/16/2024-the "new "right lower lobe nodule noted in August is not appreciable by my review, stable right lower lobe subpleural nodule  3. diffuse wall thickening of the bladder, 4. History of BPH , status post a laser vaporization of the prostate in February 2014 and TUR August 2015   5.  History of multiple skin cancers including basal cell carcinoma and squamous cell carcinomas   6. coronary artery disease, decreased left ventricular ejection , Status post coronary artery bypass surgery July 2017        Disposition: Mr [name] [name] is in clinical remission from lung cancer.  He would like to continue follow-up with the Cancer center.  He will return for an office visit and surveillance chest CT in 1 year.  He continues follow-up with Dr. Yetta Barre for surveillance of skin cancer.  Thornton Papas, MD  02/23/2024  10:25 AM

## 2024-03-01 DIAGNOSIS — N182 Chronic kidney disease, stage 2 (mild): Secondary | ICD-10-CM | POA: Diagnosis not present

## 2024-03-01 DIAGNOSIS — I700 Atherosclerosis of aorta: Secondary | ICD-10-CM | POA: Diagnosis not present

## 2024-03-01 DIAGNOSIS — I10 Essential (primary) hypertension: Secondary | ICD-10-CM | POA: Diagnosis not present

## 2024-03-01 DIAGNOSIS — I255 Ischemic cardiomyopathy: Secondary | ICD-10-CM | POA: Diagnosis not present

## 2024-03-01 DIAGNOSIS — E7800 Pure hypercholesterolemia, unspecified: Secondary | ICD-10-CM | POA: Diagnosis not present

## 2024-03-01 DIAGNOSIS — G720 Drug-induced myopathy: Secondary | ICD-10-CM | POA: Diagnosis not present

## 2024-03-01 DIAGNOSIS — Z85118 Personal history of other malignant neoplasm of bronchus and lung: Secondary | ICD-10-CM | POA: Diagnosis not present

## 2024-03-01 DIAGNOSIS — N360 Urethral fistula: Secondary | ICD-10-CM | POA: Diagnosis not present

## 2024-03-01 DIAGNOSIS — I480 Paroxysmal atrial fibrillation: Secondary | ICD-10-CM | POA: Diagnosis not present

## 2024-03-01 DIAGNOSIS — I25810 Atherosclerosis of coronary artery bypass graft(s) without angina pectoris: Secondary | ICD-10-CM | POA: Diagnosis not present

## 2024-03-01 DIAGNOSIS — Z9359 Other cystostomy status: Secondary | ICD-10-CM | POA: Diagnosis not present

## 2024-03-01 DIAGNOSIS — R7303 Prediabetes: Secondary | ICD-10-CM | POA: Diagnosis not present

## 2024-03-24 DIAGNOSIS — N35011 Post-traumatic bulbous urethral stricture: Secondary | ICD-10-CM | POA: Diagnosis not present

## 2024-03-25 DIAGNOSIS — N360 Urethral fistula: Secondary | ICD-10-CM | POA: Diagnosis not present

## 2024-03-25 DIAGNOSIS — N3289 Other specified disorders of bladder: Secondary | ICD-10-CM | POA: Diagnosis not present

## 2024-03-25 DIAGNOSIS — N400 Enlarged prostate without lower urinary tract symptoms: Secondary | ICD-10-CM | POA: Diagnosis not present

## 2024-03-26 DIAGNOSIS — H353132 Nonexudative age-related macular degeneration, bilateral, intermediate dry stage: Secondary | ICD-10-CM | POA: Diagnosis not present

## 2024-03-26 DIAGNOSIS — H02132 Senile ectropion of right lower eyelid: Secondary | ICD-10-CM | POA: Diagnosis not present

## 2024-03-26 DIAGNOSIS — H35443 Age-related reticular degeneration of retina, bilateral: Secondary | ICD-10-CM | POA: Diagnosis not present

## 2024-03-26 DIAGNOSIS — H43813 Vitreous degeneration, bilateral: Secondary | ICD-10-CM | POA: Diagnosis not present

## 2024-03-26 DIAGNOSIS — H02135 Senile ectropion of left lower eyelid: Secondary | ICD-10-CM | POA: Diagnosis not present

## 2024-04-26 DIAGNOSIS — N35011 Post-traumatic bulbous urethral stricture: Secondary | ICD-10-CM | POA: Diagnosis not present

## 2024-05-04 DIAGNOSIS — C44329 Squamous cell carcinoma of skin of other parts of face: Secondary | ICD-10-CM | POA: Diagnosis not present

## 2024-05-04 DIAGNOSIS — Z85828 Personal history of other malignant neoplasm of skin: Secondary | ICD-10-CM | POA: Diagnosis not present

## 2024-05-04 DIAGNOSIS — D692 Other nonthrombocytopenic purpura: Secondary | ICD-10-CM | POA: Diagnosis not present

## 2024-05-04 DIAGNOSIS — D0439 Carcinoma in situ of skin of other parts of face: Secondary | ICD-10-CM | POA: Diagnosis not present

## 2024-05-04 DIAGNOSIS — D2272 Melanocytic nevi of left lower limb, including hip: Secondary | ICD-10-CM | POA: Diagnosis not present

## 2024-05-04 DIAGNOSIS — L821 Other seborrheic keratosis: Secondary | ICD-10-CM | POA: Diagnosis not present

## 2024-05-04 DIAGNOSIS — D485 Neoplasm of uncertain behavior of skin: Secondary | ICD-10-CM | POA: Diagnosis not present

## 2024-05-04 DIAGNOSIS — D2271 Melanocytic nevi of right lower limb, including hip: Secondary | ICD-10-CM | POA: Diagnosis not present

## 2024-05-04 DIAGNOSIS — L570 Actinic keratosis: Secondary | ICD-10-CM | POA: Diagnosis not present

## 2024-05-13 DIAGNOSIS — H353132 Nonexudative age-related macular degeneration, bilateral, intermediate dry stage: Secondary | ICD-10-CM | POA: Diagnosis not present

## 2024-05-25 DIAGNOSIS — N35011 Post-traumatic bulbous urethral stricture: Secondary | ICD-10-CM | POA: Diagnosis not present

## 2024-06-12 DIAGNOSIS — H353132 Nonexudative age-related macular degeneration, bilateral, intermediate dry stage: Secondary | ICD-10-CM | POA: Diagnosis not present

## 2024-06-22 ENCOUNTER — Ambulatory Visit (HOSPITAL_COMMUNITY)
Admission: RE | Admit: 2024-06-22 | Discharge: 2024-06-22 | Disposition: A | Discharge status: Home / Self Care | Source: Ambulatory Visit | Attending: Cardiology | Admitting: Cardiology

## 2024-06-22 DIAGNOSIS — I6523 Occlusion and stenosis of bilateral carotid arteries: Secondary | ICD-10-CM | POA: Diagnosis not present

## 2024-06-23 DIAGNOSIS — C44329 Squamous cell carcinoma of skin of other parts of face: Secondary | ICD-10-CM | POA: Diagnosis not present

## 2024-06-23 DIAGNOSIS — Z85828 Personal history of other malignant neoplasm of skin: Secondary | ICD-10-CM | POA: Diagnosis not present

## 2024-06-23 DIAGNOSIS — L988 Other specified disorders of the skin and subcutaneous tissue: Secondary | ICD-10-CM | POA: Diagnosis not present

## 2024-06-24 ENCOUNTER — Ambulatory Visit: Payer: Self-pay | Admitting: Cardiology

## 2024-06-24 DIAGNOSIS — I6523 Occlusion and stenosis of bilateral carotid arteries: Secondary | ICD-10-CM

## 2024-06-25 DIAGNOSIS — I2510 Atherosclerotic heart disease of native coronary artery without angina pectoris: Secondary | ICD-10-CM | POA: Diagnosis not present

## 2024-06-25 DIAGNOSIS — Z951 Presence of aortocoronary bypass graft: Secondary | ICD-10-CM | POA: Diagnosis not present

## 2024-06-25 DIAGNOSIS — N35919 Unspecified urethral stricture, male, unspecified site: Secondary | ICD-10-CM | POA: Diagnosis not present

## 2024-06-25 DIAGNOSIS — N360 Urethral fistula: Secondary | ICD-10-CM | POA: Diagnosis not present

## 2024-06-25 HISTORY — PX: URETHROPLASTY: SHX499

## 2024-06-28 NOTE — Telephone Encounter (Signed)
 Spoke with pt regarding his results. Carotid doppler ordered for 1 year out. Pt aware. Pt verbalized understanding. All questions if any were answered.

## 2024-06-28 NOTE — Telephone Encounter (Signed)
-----   Message from Lynwood Schilling sent at 06/24/2024  9:49 PM EDT ----- Right Carotid: Velocities in the right ICA are consistent with a 40-59%                stenosis.  Left Carotid: Velocities in the left ICA are consistent with a 40-59% stenosis. Follow up in one year with repeat Doppler  ----- Message ----- From: Interface, [employer] Sent: 06/22/2024   9:58 AM EDT To: Lynwood Schilling, MD

## 2024-07-01 ENCOUNTER — Other Ambulatory Visit: Payer: Self-pay | Admitting: Thoracic Surgery (Cardiothoracic Vascular Surgery)

## 2024-07-01 DIAGNOSIS — C3412 Malignant neoplasm of upper lobe, left bronchus or lung: Secondary | ICD-10-CM

## 2024-07-05 DIAGNOSIS — Z85828 Personal history of other malignant neoplasm of skin: Secondary | ICD-10-CM | POA: Diagnosis not present

## 2024-07-05 DIAGNOSIS — L905 Scar conditions and fibrosis of skin: Secondary | ICD-10-CM | POA: Diagnosis not present

## 2024-07-05 DIAGNOSIS — L821 Other seborrheic keratosis: Secondary | ICD-10-CM | POA: Diagnosis not present

## 2024-07-05 DIAGNOSIS — L570 Actinic keratosis: Secondary | ICD-10-CM | POA: Diagnosis not present

## 2024-07-06 DIAGNOSIS — N360 Urethral fistula: Secondary | ICD-10-CM | POA: Diagnosis not present

## 2024-07-07 ENCOUNTER — Other Ambulatory Visit: Payer: Self-pay | Admitting: Cardiology

## 2024-07-09 ENCOUNTER — Other Ambulatory Visit: Payer: Self-pay | Admitting: Cardiology

## 2024-07-09 MED ORDER — PRAVASTATIN SODIUM 80 MG PO TABS: 80.0000 mg | ORAL_TABLET | Freq: Every evening | ORAL | 1 refills | Status: DC

## 2024-07-12 DIAGNOSIS — H353132 Nonexudative age-related macular degeneration, bilateral, intermediate dry stage: Secondary | ICD-10-CM | POA: Diagnosis not present

## 2024-07-26 DIAGNOSIS — N3945 Continuous leakage: Secondary | ICD-10-CM | POA: Diagnosis not present

## 2024-07-26 DIAGNOSIS — Z435 Encounter for attention to cystostomy: Secondary | ICD-10-CM | POA: Diagnosis not present

## 2024-07-26 DIAGNOSIS — N360 Urethral fistula: Secondary | ICD-10-CM | POA: Diagnosis not present

## 2024-07-27 ENCOUNTER — Other Ambulatory Visit: Payer: Self-pay | Admitting: Cardiology

## 2024-08-03 ENCOUNTER — Other Ambulatory Visit: Payer: Self-pay | Admitting: Cardiology

## 2024-08-03 DIAGNOSIS — E785 Hyperlipidemia, unspecified: Secondary | ICD-10-CM

## 2024-08-04 DIAGNOSIS — N360 Urethral fistula: Secondary | ICD-10-CM | POA: Diagnosis not present

## 2024-08-10 ENCOUNTER — Ambulatory Visit (HOSPITAL_COMMUNITY)
Admission: RE | Admit: 2024-08-10 | Discharge: 2024-08-10 | Disposition: A | Discharge status: Home / Self Care | Source: Ambulatory Visit | Attending: Thoracic Surgery (Cardiothoracic Vascular Surgery) | Admitting: Thoracic Surgery (Cardiothoracic Vascular Surgery)

## 2024-08-10 DIAGNOSIS — Z902 Acquired absence of lung [part of]: Secondary | ICD-10-CM | POA: Insufficient documentation

## 2024-08-10 DIAGNOSIS — J479 Bronchiectasis, uncomplicated: Secondary | ICD-10-CM | POA: Diagnosis not present

## 2024-08-10 DIAGNOSIS — C3412 Malignant neoplasm of upper lobe, left bronchus or lung: Secondary | ICD-10-CM | POA: Insufficient documentation

## 2024-08-10 DIAGNOSIS — I2510 Atherosclerotic heart disease of native coronary artery without angina pectoris: Secondary | ICD-10-CM | POA: Diagnosis not present

## 2024-08-10 DIAGNOSIS — I700 Atherosclerosis of aorta: Secondary | ICD-10-CM | POA: Insufficient documentation

## 2024-08-10 DIAGNOSIS — R918 Other nonspecific abnormal finding of lung field: Secondary | ICD-10-CM | POA: Insufficient documentation

## 2024-08-11 DIAGNOSIS — H353132 Nonexudative age-related macular degeneration, bilateral, intermediate dry stage: Secondary | ICD-10-CM | POA: Diagnosis not present

## 2024-08-17 ENCOUNTER — Other Ambulatory Visit: Payer: Self-pay | Admitting: Orthopaedic Surgery

## 2024-08-17 DIAGNOSIS — G8929 Other chronic pain: Secondary | ICD-10-CM

## 2024-08-17 DIAGNOSIS — M25512 Pain in left shoulder: Secondary | ICD-10-CM | POA: Diagnosis not present

## 2024-08-17 DIAGNOSIS — M25511 Pain in right shoulder: Secondary | ICD-10-CM | POA: Diagnosis not present

## 2024-08-18 ENCOUNTER — Telehealth: Payer: Self-pay

## 2024-08-18 NOTE — Telephone Encounter (Signed)
   Pre-operative Risk Assessment    Patient Name: [name] [name]  DOB: 01-Feb-1936 MRN: [id number]   Date of last office visit: 11/14/23 LYNWOOD SCHILLING, MD Date of next office visit: NONE   Request for Surgical Clearance    Procedure:  LEFT REVERSE TOTAL SHOULDER ARTHROPLASTY  Date of Surgery:  Clearance 10/20/24                                Surgeon:  BONNER HAIR, MD Surgeon's Group or Practice Name:  [facility] Phone number:  [phone]   EXT  3132 Fax number:  [phone]    ATTN: MAEOLA DIVERS   Type of Clearance Requested:   - Medical  - Pharmacy:  Hold Aspirin      Type of Anesthesia:  WITH INTERSCALENE BLOCK   Additional requests/questions:    SignedLucie DELENA Ku   08/18/2024, 7:51 AM

## 2024-08-18 NOTE — Telephone Encounter (Signed)
 Left message to call back to schedule tele pre op appt.

## 2024-08-18 NOTE — Telephone Encounter (Signed)
   Name: [name] [name]  DOB: 07-08-1936  MRN: [id number]  Primary Cardiologist: Lynwood Schilling, MD   Preoperative team, please contact this patient and set up a phone call appointment for further preoperative risk assessment. Please obtain consent and complete medication review. Thank you for your help. Last seen by Dr. Schilling on 11/14/2023. Clearance for 10/20/2024.   I confirm that guidance regarding antiplatelet and oral anticoagulation therapy has been completed and, if necessary, noted below.  Per office protocol, if patient is without any new symptoms or concerns at the time of their virtual visit, he may hold Aspirin  for 7 days prior to procedure. Please resume Aspirin  as soon as possible postprocedure, at the discretion of the surgeon.    I also confirmed the patient resides in the state of [place] . As per [facility] Medical Board telemedicine laws, the patient must reside in the state in which the provider is licensed.   [name] Satterfield, NP 08/18/2024, 9:50 AM [facility]

## 2024-08-19 ENCOUNTER — Ambulatory Visit
Admission: RE | Admit: 2024-08-19 | Discharge: 2024-08-19 | Disposition: A | Discharge status: Home / Self Care | Source: Ambulatory Visit | Attending: Orthopaedic Surgery | Admitting: Orthopaedic Surgery

## 2024-08-19 ENCOUNTER — Telehealth: Payer: Self-pay

## 2024-08-19 DIAGNOSIS — G8929 Other chronic pain: Secondary | ICD-10-CM

## 2024-08-19 DIAGNOSIS — M19012 Primary osteoarthritis, left shoulder: Secondary | ICD-10-CM | POA: Diagnosis not present

## 2024-08-19 NOTE — Telephone Encounter (Signed)
 Patient has been scheduled for televisit and consent done     Patient Consent for Virtual Visit         [name] [name] has provided verbal consent on 08/19/2024 for a virtual visit (video or telephone).   CONSENT FOR VIRTUAL VISIT FOR:  [name] [name]  By participating in this virtual visit I agree to the following:  I hereby voluntarily request, consent and authorize [facility] and its employed or contracted physicians, physician assistants, nurse practitioners or other licensed health care professionals (the Practitioner), to provide me with telemedicine health care services (the "Services) as deemed necessary by the treating Practitioner. I acknowledge and consent to receive the Services by the Practitioner via telemedicine. I understand that the telemedicine visit will involve communicating with the Practitioner through live audiovisual communication technology and the disclosure of certain medical information by electronic transmission. I acknowledge that I have been given the opportunity to request an in-person assessment or other available alternative prior to the telemedicine visit and am voluntarily participating in the telemedicine visit.  I understand that I have the right to withhold or withdraw my consent to the use of telemedicine in the course of my care at any time, without affecting my right to future care or treatment, and that the Practitioner or I may terminate the telemedicine visit at any time. I understand that I have the right to inspect all information obtained and/or recorded in the course of the telemedicine visit and may receive copies of available information for a reasonable fee.  I understand that some of the potential risks of receiving the Services via telemedicine include:  Delay or interruption in medical evaluation due to technological equipment failure or disruption; Information transmitted may not be sufficient (e.g. poor resolution of images) to  allow for appropriate medical decision making by the Practitioner; and/or  In rare instances, security protocols could fail, causing a breach of personal health information.  Furthermore, I acknowledge that it is my responsibility to provide information about my medical history, conditions and care that is complete and accurate to the best of my ability. I acknowledge that Practitioner's advice, recommendations, and/or decision may be based on factors not within their control, such as incomplete or inaccurate data provided by me or distortions of diagnostic images or specimens that may result from electronic transmissions. I understand that the practice of medicine is not an exact science and that Practitioner makes no warranties or guarantees regarding treatment outcomes. I acknowledge that a copy of this consent can be made available to me via my patient portal [facility] MyChart), or I can request a printed copy by calling the office of  [facility].    I understand that my insurance will be billed for this visit.   I have read or had this consent read to me. I understand the contents of this consent, which adequately explains the benefits and risks of the Services being provided via telemedicine.  I have been provided ample opportunity to ask questions regarding this consent and the Services and have had my questions answered to my satisfaction. I give my informed consent for the services to be provided through the use of telemedicine in my medical care

## 2024-08-19 NOTE — Telephone Encounter (Signed)
 Pt calling to schedule preop appt.

## 2024-08-19 NOTE — Telephone Encounter (Signed)
 Patient has been scheduled

## 2024-08-24 ENCOUNTER — Encounter: Payer: Self-pay | Admitting: Thoracic Surgery (Cardiothoracic Vascular Surgery)

## 2024-08-24 ENCOUNTER — Ambulatory Visit
Attending: Thoracic Surgery (Cardiothoracic Vascular Surgery) | Admitting: Thoracic Surgery (Cardiothoracic Vascular Surgery)

## 2024-08-24 VITALS — BP 115/63 | HR 70 | Resp 20 | Ht 64.0 in | Wt 159.0 lb

## 2024-08-24 DIAGNOSIS — C3412 Malignant neoplasm of upper lobe, left bronchus or lung: Secondary | ICD-10-CM | POA: Diagnosis not present

## 2024-08-24 NOTE — Progress Notes (Signed)
 [name] [place] [address]             [phone]     HPI: [name] returns for scheduled follow-up visit  [name] [name] is an 88 year old man with a history of a left upper lobectomy for stage Ib non-small cell carcinoma, CAD, ischemic cardiomyopathy, and CABG.  Dr. Army did a left upper lobectomy for stage Ib non-small cell carcinoma in 2013.  He has been followed since then with no evidence of recurrent disease.  He then did coronary bypass grafting in 2017.  I last saw [name] [name] 2024.  He was doing well at that time with no evidence of recurrent disease.  Has recently been having some trouble with his left shoulder.  He had his right shoulder replaced previously.  No chest pain or shortness of breath.  Overall feels well.  Did have a smoking history but quit in 1967.  Past Medical History:  Diagnosis Date   Arthritis    JOINT PAIN RIGHT HAND   BPH (benign prostatic hyperplasia)    [name]/elevated PSA, prostate biopsy x4 including one saturation biopsy, laser treatment 2/14; NOCTURIA   CAD (coronary artery disease) 12/31/2011   cath 05/2012 showing 80-90% stenosis of a trifurcating diagonal #1, 70-80% stenosis of OM3 and 90% stenosis of distal LCx after OM3 - medical management, [facility]   Cardiomyopathy (HCC)    dilated cardiomyopathy EF 30%, MUGA  EF 42% 08/2012   Carotid artery occlusion    carotid artery bruit   CHF (congestive heart failure) (HCC)    Chronic kidney disease    CKD3a   Heart murmur    as a child   History of kidney stones    History of shingles    Hyperlipidemia    statin intolerant   Hypertension    Lung cancer (HCC) 2013   Left upper lobectomy   Lung mass    Stage 1B non-small cell lung CA s/p resection 2013   PVC (premature ventricular contraction)    Shingles    Sigmoid diverticulosis    Skin cancer    Multiple skin cancers    Squamous cell carcinoma, face    history of right face with mets to  right upper cheek in 2004  and facial lymph node reoccurence post surgery with XRT   Trigger finger    Bilateral   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     aspirin  EC 81 MG tablet Take 1 tablet (81 mg total) by mouth daily. Swallow whole.     carvedilol  (COREG ) 6.25 MG tablet TAKE 1 TABLET (6.25 MG TOTAL) BY MOUTH 2 (TWO) TIMES DAILY. NEEDS OFFICE VISIT [phone] 60 tablet 0   ezetimibe  (ZETIA ) 10 MG tablet TAKE 1 TABLET BY MOUTH EVERY DAY 90 tablet 0   Flaxseed, Linseed, (FLAXSEED OIL) 1400 MG CAPS Take 1,400 mg by mouth daily.     Garlic 1000 MG CAPS Take 1,000 mg by mouth 2 (two) times daily.     Multiple Vitamin (MULTIVITAMIN WITH MINERALS) TABS tablet Take 1 tablet by mouth daily.     nitroGLYCERIN  (NITROSTAT ) 0.4 MG SL tablet Place 1 tablet (0.4 mg total) under the tongue every 5 (five) minutes as needed for chest pain. 25 tablet 12   Omega-3 Fatty Acids (FISH OIL) 1000 MG CAPS  Take 1,000 mg by mouth in the morning and at bedtime.     omeprazole  (PRILOSEC) 20 MG capsule Take 1 capsule (20 mg total) by mouth daily. To gastric protection while taking NSAIDs 30 capsule 0   pravastatin  (PRAVACHOL ) 80 MG tablet Take 1 tablet (80 mg total) by mouth every evening. 90 tablet 1   sacubitril -valsartan  (ENTRESTO ) 49-51 MG TAKE 1 TABLET BY MOUTH TWICE A DAY. 180 tablet 0   spironolactone  (ALDACTONE ) 25 MG tablet Take 1 tablet (25 mg total) by mouth daily. Call patient for office visit [phone] (Patient taking differently: Take 12.5 mg by mouth daily.) 90 tablet 0   VITAMIN D PO Take 1 tablet by mouth daily.     ZINC OXIDE PO Take 500 mg by mouth daily.     No current facility-administered medications for this visit.    Physical Exam BP 115/63   Pulse 70   Resp 20   Ht 5' 4 (1.626 m)   Wt 159 lb (72.1 kg)   SpO2 98% Comment: RA  BMI 27.36 kg/m   88 year old man in no acute distress Alert and oriented x 3 with no focal deficits Lungs diminished on left otherwise clear Cardiac regular rate and rhythm, no murmur No cervical supraclavicular adenopathy  Diagnostic Tests: CT CHEST WITHOUT CONTRAST   TECHNIQUE: Multidetector CT imaging of the chest was performed following the standard protocol without IV contrast.   RADIATION DOSE REDUCTION: This exam was performed according to the departmental dose-optimization program which includes automated exposure control, adjustment of the mA and/or kV according to patient size and/or use of iterative reconstruction technique.   COMPARISON:  CT chest dated 02/16/2024 and multiple priors   FINDINGS: Cardiovascular: Mild multichamber cardiomegaly. No significant pericardial fluid/thickening. Great vessels are normal in course and caliber. Coronary artery calcifications and aortic atherosclerosis.   Mediastinum/Nodes: Postsurgical changes of right thyroidectomy. Stable appearance of the left thyroid  gland. Normal esophagus. No pathologically enlarged axillary, supraclavicular, mediastinal, or hilar lymph nodes.   Lungs/Pleura: The central airways are patent. Postsurgical changes of left upper lobectomy. Unchanged pleuroparenchymal scarring along the anteromedial right apex. Unchanged mild diffuse bronchiectasis. Age unchanged 5 x 3 mm basilar right lower lobe nodule (302:99). Unchanged 4 mm right middle lobe ground-glass nodule (302:80), dating back to at least 01/28/2019, demonstrating 5 year stability. No new or enlarging pulmonary nodules. No focal consolidation. No pneumothorax. No pleural effusion.   Upper abdomen: Subcentimeter hepatic segment 7 hypodensity (301:100), too small to characterize but unchanged from recent studies and decreased in size from 01/28/2019, when it measured 1.3 cm, possibly an involuting cyst. Colonic diverticulosis without acute diverticulitis.    Musculoskeletal: No acute or abnormal lytic or blastic osseous lesions. Partially imaged left shoulder arthroplasty. Median sternotomy wires are nondisplaced. Multilevel degenerative changes of the thoracic spine.   IMPRESSION: 1. Postsurgical changes of left upper lobectomy without evidence of local recurrence or metastatic disease in the chest. 2. Unchanged 5 mm basilar right lower lobe nodule. Unchanged 4 mm right middle lobe ground-glass nodule dating back to at least 01/28/2019, demonstrating 5 year stability. No new or enlarging pulmonary nodules. 3. Aortic Atherosclerosis (ICD10-I70.0). Coronary artery calcifications. Assessment for potential risk factor modification, dietary therapy or pharmacologic therapy may be warranted, if clinically indicated.     Electronically Signed   By: Limin  Xu M.D.   On: 08/10/2024 09:35 I personally reviewed the CT images.  Stable right lower middle lobe nodules.  Coronary and aortic atherosclerosis.  Status post left upper  lobectomy.  Impression: [name] [name] is an 88 year old man with a history of a left upper lobectomy for stage Ib non-small cell carcinoma, CAD, ischemic cardiomyopathy, and CABG.  Stage Ib non-small cell carcinoma-status post left upper lobectomy in 2013.  Has been followed with CTs since then.  No evidence of recurrent disease.  Has had some small nodules in the right lung that are stable for 5 years.  No indication for additional follow-up.  CAD-status post coronary bypass grafting in 2017.  No anginal symptoms.  Plan: Follow-up with Dr. Cloretta and Dr. Lavona I will be happy to see [name] [name] back anytime in the future if I can be of any assistance with his care.  I spent over 20 minutes in review of records, images, and in consultation with [name] [name] today. Elspeth JAYSON Millers, MD [facility] [phone]

## 2024-08-25 DIAGNOSIS — N3945 Continuous leakage: Secondary | ICD-10-CM | POA: Diagnosis not present

## 2024-09-02 DIAGNOSIS — N182 Chronic kidney disease, stage 2 (mild): Secondary | ICD-10-CM | POA: Diagnosis not present

## 2024-09-02 DIAGNOSIS — I25810 Atherosclerosis of coronary artery bypass graft(s) without angina pectoris: Secondary | ICD-10-CM | POA: Diagnosis not present

## 2024-09-02 DIAGNOSIS — G720 Drug-induced myopathy: Secondary | ICD-10-CM | POA: Diagnosis not present

## 2024-09-02 DIAGNOSIS — Z0000 Encounter for general adult medical examination without abnormal findings: Secondary | ICD-10-CM | POA: Diagnosis not present

## 2024-09-02 DIAGNOSIS — N360 Urethral fistula: Secondary | ICD-10-CM | POA: Diagnosis not present

## 2024-09-02 DIAGNOSIS — I5022 Chronic systolic (congestive) heart failure: Secondary | ICD-10-CM | POA: Diagnosis not present

## 2024-09-02 DIAGNOSIS — Z85118 Personal history of other malignant neoplasm of bronchus and lung: Secondary | ICD-10-CM | POA: Diagnosis not present

## 2024-09-02 DIAGNOSIS — Z1331 Encounter for screening for depression: Secondary | ICD-10-CM | POA: Diagnosis not present

## 2024-09-02 DIAGNOSIS — E7800 Pure hypercholesterolemia, unspecified: Secondary | ICD-10-CM | POA: Diagnosis not present

## 2024-09-02 DIAGNOSIS — Z01818 Encounter for other preprocedural examination: Secondary | ICD-10-CM | POA: Diagnosis not present

## 2024-09-02 DIAGNOSIS — I700 Atherosclerosis of aorta: Secondary | ICD-10-CM | POA: Diagnosis not present

## 2024-09-02 DIAGNOSIS — R7303 Prediabetes: Secondary | ICD-10-CM | POA: Diagnosis not present

## 2024-09-02 DIAGNOSIS — I10 Essential (primary) hypertension: Secondary | ICD-10-CM | POA: Diagnosis not present

## 2024-09-10 DIAGNOSIS — H353132 Nonexudative age-related macular degeneration, bilateral, intermediate dry stage: Secondary | ICD-10-CM | POA: Diagnosis not present

## 2024-09-13 DIAGNOSIS — N400 Enlarged prostate without lower urinary tract symptoms: Secondary | ICD-10-CM | POA: Diagnosis not present

## 2024-09-13 DIAGNOSIS — N35912 Unspecified bulbous urethral stricture, male: Secondary | ICD-10-CM | POA: Diagnosis not present

## 2024-09-13 DIAGNOSIS — N360 Urethral fistula: Secondary | ICD-10-CM | POA: Diagnosis not present

## 2024-09-13 DIAGNOSIS — Z96619 Presence of unspecified artificial shoulder joint: Secondary | ICD-10-CM | POA: Diagnosis not present

## 2024-09-15 ENCOUNTER — Other Ambulatory Visit: Payer: Self-pay | Admitting: Cardiology

## 2024-10-07 NOTE — Patient Instructions (Addendum)
 SURGICAL WAITING ROOM VISITATION Patients having surgery or a procedure may have no more than 2 support people in the waiting area - these visitors may rotate in the visitor waiting room.   If the patient needs to stay at the hospital during part of their recovery, the visitor guidelines for inpatient rooms apply.  PRE-OP VISITATION  Pre-op nurse will coordinate an appropriate time for 1 support person to accompany the patient in pre-op.  This support person may not rotate.  This visitor will be contacted when the time is appropriate for the visitor to come back in the pre-op area.  Please refer to the [facility] website for the visitor guidelines for Inpatients (after your surgery is over and you are in a regular room).  You are not required to quarantine at this time prior to your surgery. However, you must do this: Hand Hygiene often Do NOT share personal items Notify your provider if you are in close contact with someone who has COVID or you develop fever 100.4 or greater, new onset of sneezing, cough, sore throat, shortness of breath or body aches.  If you test positive for Covid or have been in contact with anyone that has tested positive in the last 10 days please notify you surgeon.    Your procedure is scheduled on:  [facility]  October 20, 2024  Report to [facility] Main Entrance: [address] entrance where the [employer] is available.   Report to admitting at: 06:00     AM  Call this number if you have any questions or problems the morning of surgery [phone]  DO NOT EAT OR DRINK ANYTHING AFTER MIDNIGHT THE NIGHT PRIOR TO YOUR SURGERY / PROCEDURE.   FOLLOW  ANY ADDITIONAL PRE OP INSTRUCTIONS YOU RECEIVED FROM YOUR SURGEON'S OFFICE!!!   Oral Hygiene is also important to reduce your risk of infection.        Remember - BRUSH YOUR TEETH THE MORNING OF SURGERY WITH YOUR REGULAR TOOTHPASTE  Do NOT smoke after Midnight the night before surgery.  STOP TAKING all  Vitamins, Herbs and supplements 1 week before your surgery.   ASPIRIN - Stop taking 5-7 days before your surgery. Last dose will be taken on Wednesday 10-13-2024  Take ONLY these medicines the morning of surgery with A SIP OF WATER : Omeprazole  ????  DO NOT TAKE SPIRONOLACTONE  or ENTRESTO  the morning of your surgery.                     You may not have any metal on your body including jewelry, and body piercing  Do not wear  lotions, powders, cologne, or deodorant  Men may shave face and neck.  Contacts, Hearing Aids, dentures or bridgework may not be worn into surgery. DENTURES WILL BE REMOVED PRIOR TO SURGERY PLEASE DO NOT APPLY Poly grip OR ADHESIVES!!!  Patients discharged on the day of surgery will not be allowed to drive home.  Someone NEEDS to stay with you for the first 24 hours after anesthesia.  Do not bring your home medications to the hospital. The Pharmacy will dispense medications listed on your medication list to you during your admission in the Hospital.  Please read over the following fact sheets you were given: IF YOU HAVE QUESTIONS ABOUT YOUR PRE-OP INSTRUCTIONS, PLEASE CALL [phone].  Pre-operative 4 CHG Bath Instructions   You can play a key role in reducing the risk of infection after surgery. Your skin needs to be as free of germs as possible. You can reduce the number of germs on your skin by washing with CHG (chlorhexidine  gluconate) soap before surgery. CHG is an antiseptic soap that kills germs and continues to kill germs even after washing.   DO NOT use if you have an allergy to chlorhexidine /CHG or antibacterial soaps. If your skin becomes reddened or irritated, stop using the CHG and notify one of our RNs at  [phone]  Please shower with the CHG soap starting 4 days before surgery using the following schedule:   SATURDAY  October 16, 2024    Please keep in mind  the following:  DO NOT shave, including legs and underarms, starting the day of your first shower.   You may shave your face at any point before/day of surgery.  Place clean sheets on your bed the day you start using CHG soap. Use a clean washcloth (not used since being washed) for each shower. DO NOT sleep with pets once you start using the CHG.  CHG Shower Instructions:  If you choose to wash your hair and private area, wash first with your normal shampoo/soap.  After you use shampoo/soap, rinse your hair and body thoroughly to remove shampoo/soap residue.  Turn the water  OFF and apply about 3 tablespoons (45 ml) of CHG soap to a CLEAN washcloth.  Apply CHG soap ONLY FROM YOUR NECK DOWN TO YOUR TOES (washing for 3-5 minutes)  DO NOT use CHG soap on face, private areas, open wounds, or sores.  Pay special attention to the area where your surgery is being performed.  If you are having back surgery, having someone wash your back for you may be helpful. Wait 2 minutes after CHG soap is applied, then you may rinse off the CHG soap.  Pat dry with a clean towel  Put on clean clothes/pajamas   If you choose to wear lotion, please use ONLY the CHG-compatible lotions on the back of this paper.     Additional instructions for the day of surgery: DO NOT APPLY any lotions, deodorants, cologne, or perfumes.   Put on clean/comfortable clothes.  Brush your teeth.  Ask your nurse before applying any prescription medications to the skin.   CHG Compatible Lotions   Aveeno Moisturizing lotion  Cetaphil Moisturizing Cream  Cetaphil Moisturizing Lotion  Clairol Herbal Essence Moisturizing Lotion, Dry Skin  Clairol Herbal Essence Moisturizing Lotion, Extra Dry Skin  Clairol Herbal Essence Moisturizing Lotion, Normal Skin  Curel Age Defying Therapeutic Moisturizing Lotion with Alpha Hydroxy  Curel Extreme Care Body Lotion  Curel Soothing Hands Moisturizing Hand Lotion  Curel Therapeutic Moisturizing  Cream, Fragrance-Free  Curel Therapeutic Moisturizing Lotion, Fragrance-Free  Curel Therapeutic Moisturizing Lotion, Original Formula  Eucerin Daily Replenishing Lotion  Eucerin Dry Skin Therapy Plus Alpha Hydroxy Crme  Eucerin Dry Skin Therapy Plus Alpha Hydroxy Lotion  Eucerin Original Crme  Eucerin Original Lotion  Eucerin Plus Crme Eucerin Plus Lotion  Eucerin TriLipid Replenishing Lotion  Keri Anti-Bacterial Hand Lotion  Keri Deep Conditioning Original Lotion Dry Skin Formula Softly Scented  Keri Deep Conditioning Original Lotion, Fragrance Free Sensitive Skin Formula  Keri Lotion Fast Absorbing Fragrance Free Sensitive Skin Formula  Keri Lotion Fast Absorbing Softly Scented Dry Skin Formula  Keri Original Lotion  Keri Skin Renewal Lotion Keri Silky Smooth Lotion  Keri Silky Smooth Sensitive Skin  Lotion  Nivea Body Creamy Conditioning Oil  Nivea Body Extra Enriched Teacher, adult education Moisturizing Lotion Nivea Crme  Nivea Skin Firming Lotion  NutraDerm 30 Skin Lotion  NutraDerm Skin Lotion  NutraDerm Therapeutic Skin Cream  NutraDerm Therapeutic Skin Lotion  ProShield Protective Hand Cream  Provon moisturizing lotion     Preparing for Total Shoulder Arthroplasty ================================================================= Please follow these instructions carefully, in addition to any other special Bathing information that was explained to you at the Presurgical Appointment:  BENZOYL PEROXIDE 5% GEL: Used to kill bacteria on the skin which could cause an infection at the surgery site.   Please do not use if you have an allergy to benzoyl peroxide. If your skin becomes reddened/irritated stop using the benzoyl peroxide and inform your Doctor.   Starting two days before surgery, apply as follows:  1. Apply benzoyl peroxide gel in the morning and at night. Apply after taking a shower. If you are not taking a shower, clean entire  shoulder front, back, and side, along with the armpit with a clean wet washcloth.  2. Place a quarter-sized dollop of the gel on your SHOULDER and rub in thoroughly, making sure to cover the front, back, and side of your shoulder, along with the armpit.   2 Days prior to Surgery  [facility] 10-18-24 First Application _______ Morning Second Application _______ Night  Day Before Surgery    TUESDAY  10-19-2024 First Application______ Morning  On the night before surgery, wash your entire body (except hair, face and private areas) with CHG Soap. THEN, rub in the LAST application of the Benzoyl Peroxide Gel on your shoulder.   3.   DO NOT USE THE BENZOYL PEROXIDE GEL OR CHG SOAP ON THE DAY OF YOUR SURGERY      FAILURE TO FOLLOW THESE INSTRUCTIONS MAY RESULT IN THE CANCELLATION OF YOUR SURGERY  PATIENT SIGNATURE_________________________________  NURSE SIGNATURE__________________________________  ________________________________________________________________________        [name] [name]    An incentive spirometer is a tool that can help keep your lungs clear and active. This tool measures how well you are filling your lungs with each breath. Taking long deep breaths may help reverse or decrease the chance of developing breathing (pulmonary) problems (especially infection) following: A long period of time when you are unable to move or be active. BEFORE THE PROCEDURE  If the spirometer includes an indicator to show your best effort, your nurse or respiratory therapist will set it to a desired goal. If possible, sit up straight or lean slightly forward. Try not to slouch. Hold the incentive spirometer in an upright position. INSTRUCTIONS FOR USE  Sit on the edge of your bed if possible, or sit up as far as you can in bed or on a chair. Hold the incentive spirometer in an upright position. Breathe out normally. Place the mouthpiece in your mouth and seal your lips tightly  around it. Breathe in slowly and as deeply as possible, raising the piston or the ball toward the top of the column. Hold your breath for 3-5 seconds or for as long as possible. Allow the piston or ball to fall to the bottom of the column. Remove the mouthpiece from your mouth and breathe out normally. Rest for a few seconds and repeat Steps 1 through 7 at least 10 times every 1-2 hours when you are awake. Take your time and take a few normal breaths between deep breaths. The spirometer may include  an indicator to show your best effort. Use the indicator as a goal to work toward during each repetition. After each set of 10 deep breaths, practice coughing to be sure your lungs are clear. If you have an incision (the cut made at the time of surgery), support your incision when coughing by placing a pillow or rolled up towels firmly against it. Once you are able to get out of bed, walk around indoors and cough well. You may stop using the incentive spirometer when instructed by your caregiver.  RISKS AND COMPLICATIONS Take your time so you do not get dizzy or light-headed. If you are in pain, you may need to take or ask for pain medication before doing incentive spirometry. It is harder to take a deep breath if you are having pain. AFTER USE Rest and breathe slowly and easily. It can be helpful to keep track of a log of your progress. Your caregiver can provide you with a simple table to help with this. If you are using the spirometer at home, follow these instructions: SEEK MEDICAL CARE IF:  You are having difficultly using the spirometer. You have trouble using the spirometer as often as instructed. Your pain medication is not giving enough relief while using the spirometer. You develop fever of 100.5 F (38.1 C) or higher.                                                                                                    SEEK IMMEDIATE MEDICAL CARE IF:  You cough up bloody sputum that had not  been present before. You develop fever of 102 F (38.9 C) or greater. You develop worsening pain at or near the incision site. MAKE SURE YOU:  Understand these instructions. Will watch your condition. Will get help right away if you are not doing well or get worse. Document Released: 04/28/2007 Document Revised: 03/09/2012 Document Reviewed: 06/29/2007 [facility] Patient Information 2014 [place], [place].        If you would like to see a video about joint replacement:   [email or web address]

## 2024-10-07 NOTE — Progress Notes (Signed)
 COVID Vaccine received:  []  No [x]  Yes Date of any COVID positive Test in last 90 days: none   PCP - Ardell Manly, MD   [facility] [place] [phone]   Medical clearance in [facility] note 09-02-24 Cardiologist - Lynwood Schilling, MD  Lamarr Satterfield, NP cardiac clearance in Epic note 08-18-24 HF- Ezra Shuck, MD Urology-  Adine Kays MD at [facility]   Chest x-ray - CT chest wo Contrast  08-10-2024 Epic EKG - 11-14-2023   Epic  Stress Test - 07-26-2016  Epic ECHO - 06-24-2023  Epic Cardiac Cath -  07-10-2016  LHC cath prior to CABG x 4    Pacemaker / ICD device [x]  No []  Yes   Spinal Cord Stimulator:[x]  No []  Yes       History of Sleep Apnea? [x]  No []  Yes   CPAP used?- [x]  No []  Yes     Does the patient monitor blood sugar?  [x]  No []  Yes  []  N/A   Patient has: []  NO Hx DM   [x]  Pre-DM   []  DM1  []   DM2 Last A1c was:  6.0 on  09-03-2024   no meds      Blood Thinner / Instructions:  none Aspirin  Instructions:  ASA  81 mg,  may hold x 7 days per Cardiology   ERAS Protocol Ordered: [x]  No  []  Yes Patient is to be NPO after: midnight prior   Comments: The patient was given Benzoyl peroxide Gel as ordered. Instruction regarding application starting 2 days prior to surgery was given and patient voiced understanding.    Activity level: Patient is able to climb a flight of stairs without difficulty; [x]  No CP  [x]  No SOB.  Patient can perform ADLs without assistance.    Anesthesia review: CABG x4 (07-19-2016), HTN, CHF, CM, CKD3a, S/p left upper lobectomy for Lung Ca (2013), s/p urethroplasty 06-25-24 at [facility],    Patient denies shortness of breath, fever, cough and chest pain at PAT appointment.   Patient verbalized understanding and agreement to the Pre-Surgical Instructions that were given to them at this PAT appointment. Patient was also educated of the need to review these PAT instructions again prior to his surgery.I reviewed the appropriate phone numbers to call if they have any and questions or  concerns.

## 2024-10-08 ENCOUNTER — Other Ambulatory Visit: Payer: Self-pay

## 2024-10-08 ENCOUNTER — Encounter (HOSPITAL_COMMUNITY)
Admission: RE | Admit: 2024-10-08 | Discharge: 2024-10-08 | Disposition: A | Discharge status: Home / Self Care | Source: Ambulatory Visit | Attending: Orthopaedic Surgery | Admitting: Orthopaedic Surgery

## 2024-10-08 ENCOUNTER — Encounter (HOSPITAL_COMMUNITY): Payer: Self-pay

## 2024-10-08 VITALS — BP 126/58 | HR 55 | Temp 98.0°F | Resp 14 | Ht 64.0 in | Wt 157.0 lb

## 2024-10-08 DIAGNOSIS — Z951 Presence of aortocoronary bypass graft: Secondary | ICD-10-CM | POA: Insufficient documentation

## 2024-10-08 DIAGNOSIS — G8929 Other chronic pain: Secondary | ICD-10-CM | POA: Insufficient documentation

## 2024-10-08 DIAGNOSIS — H35443 Age-related reticular degeneration of retina, bilateral: Secondary | ICD-10-CM | POA: Diagnosis not present

## 2024-10-08 DIAGNOSIS — M25512 Pain in left shoulder: Secondary | ICD-10-CM | POA: Diagnosis not present

## 2024-10-08 DIAGNOSIS — N1831 Chronic kidney disease, stage 3a: Secondary | ICD-10-CM | POA: Diagnosis not present

## 2024-10-08 DIAGNOSIS — Z01818 Encounter for other preprocedural examination: Secondary | ICD-10-CM

## 2024-10-08 DIAGNOSIS — I255 Ischemic cardiomyopathy: Secondary | ICD-10-CM | POA: Diagnosis not present

## 2024-10-08 DIAGNOSIS — Z87891 Personal history of nicotine dependence: Secondary | ICD-10-CM | POA: Insufficient documentation

## 2024-10-08 DIAGNOSIS — I25110 Atherosclerotic heart disease of native coronary artery with unstable angina pectoris: Secondary | ICD-10-CM | POA: Insufficient documentation

## 2024-10-08 DIAGNOSIS — I130 Hypertensive heart and chronic kidney disease with heart failure and stage 1 through stage 4 chronic kidney disease, or unspecified chronic kidney disease: Secondary | ICD-10-CM | POA: Insufficient documentation

## 2024-10-08 DIAGNOSIS — H353132 Nonexudative age-related macular degeneration, bilateral, intermediate dry stage: Secondary | ICD-10-CM | POA: Diagnosis not present

## 2024-10-08 DIAGNOSIS — Z01812 Encounter for preprocedural laboratory examination: Secondary | ICD-10-CM | POA: Insufficient documentation

## 2024-10-08 DIAGNOSIS — I509 Heart failure, unspecified: Secondary | ICD-10-CM | POA: Diagnosis not present

## 2024-10-08 DIAGNOSIS — H43813 Vitreous degeneration, bilateral: Secondary | ICD-10-CM | POA: Diagnosis not present

## 2024-10-08 DIAGNOSIS — H02135 Senile ectropion of left lower eyelid: Secondary | ICD-10-CM | POA: Diagnosis not present

## 2024-10-08 DIAGNOSIS — M19012 Primary osteoarthritis, left shoulder: Secondary | ICD-10-CM | POA: Insufficient documentation

## 2024-10-08 DIAGNOSIS — H02132 Senile ectropion of right lower eyelid: Secondary | ICD-10-CM | POA: Diagnosis not present

## 2024-10-08 HISTORY — DX: Prediabetes: R73.03

## 2024-10-08 LAB — CBC
HCT: 37.9 % — ABNORMAL LOW (ref 39.0–52.0)
Hemoglobin: 11.6 g/dL — ABNORMAL LOW (ref 13.0–17.0)
MCH: 29 pg (ref 26.0–34.0)
MCHC: 30.6 g/dL (ref 30.0–36.0)
MCV: 94.8 fL (ref 80.0–100.0)
Platelets: 189 K/uL (ref 150–400)
RBC: 4 MIL/uL — ABNORMAL LOW (ref 4.22–5.81)
RDW: 14.3 % (ref 11.5–15.5)
WBC: 7.1 K/uL (ref 4.0–10.5)
nRBC: 0 % (ref 0.0–0.2)

## 2024-10-08 LAB — BASIC METABOLIC PANEL WITH GFR
Anion gap: 9 (ref 5–15)
BUN: 30 mg/dL — ABNORMAL HIGH (ref 8–23)
CO2: 27 mmol/L (ref 22–32)
Calcium: 9.7 mg/dL (ref 8.9–10.3)
Chloride: 103 mmol/L (ref 98–111)
Creatinine, Ser: 1.04 mg/dL (ref 0.61–1.24)
GFR, Estimated: >60 mL/min (ref >60)
Glucose, Bld: 84 mg/dL (ref 70–99)
Potassium: 4.9 mmol/L (ref 3.5–5.1)
Sodium: 139 mmol/L (ref 135–145)

## 2024-10-08 LAB — SURGICAL PCR SCREEN
MRSA, PCR: NEGATIVE
Staphylococcus aureus: POSITIVE — AB

## 2024-10-08 NOTE — Progress Notes (Signed)
 Patient's PCR screen is positive for STAPH. Appropriate notes have been placed on the patient's chart. This note has been routed to Dr. Cristy and Aleck Stalling, PA for review. The Patient's surgery is currently scheduled for: 10-20-2024 at [facility].  Shawnee Aloe, BSN, CVRN-BC   Pre-Surgical Testing Nurse [facility]- [facility]  [phone]

## 2024-10-10 DIAGNOSIS — H353132 Nonexudative age-related macular degeneration, bilateral, intermediate dry stage: Secondary | ICD-10-CM | POA: Diagnosis not present

## 2024-10-11 ENCOUNTER — Ambulatory Visit: Attending: Cardiology

## 2024-10-11 DIAGNOSIS — Z01810 Encounter for preprocedural cardiovascular examination: Secondary | ICD-10-CM | POA: Diagnosis not present

## 2024-10-11 NOTE — Progress Notes (Signed)
 Virtual Visit via Telephone Note   Because of RYEN RHAMES co-morbid illnesses, he is at least at moderate risk for complications without adequate follow up.  This format is felt to be most appropriate for this patient at this time.  Due to technical limitations with video connection (technology), today's appointment will be conducted as an audio only telehealth visit, and [name] [name] verbally agreed to proceed in this manner.   All issues noted in this document were discussed and addressed.  No physical exam could be performed with this format.  Evaluation Performed:  Preoperative cardiovascular risk assessment _____________   Date:  10/11/2024   Patient ID:  [name] [name], DOB September 17, 1936, MRN [id number] Patient Location:  Home Provider location:   Office  Primary Care Provider:  Ransom Other, MD Primary Cardiologist:  Lynwood Schilling, MD  Chief Complaint / Patient Profile   88 y.o. y/o male with a h/o coronary artery disease status post CABG, ischemic cardiomyopathy with an LVEF of 45% chronic heart murmur, prediabetes, PVCs carotid artery who is pending left reverse total shoulder arthroplasty on 10/20/2024 and presents today for telephonic preoperative cardiovascular risk assessment.  History of Present Illness    [name] [name] is a 88 y.o. male who presents via audio/video conferencing for a telehealth visit today.  Pt was last seen in cardiology clinic on 11/14/2023 by Dr. Schilling.  At that time [name] [name] was doing well.  The patient is now pending procedure as outlined above. Since his last visit, he has been doing well without any chest pains or shortness of breath.  No lower extremity swelling.  No skipped heartbeats.  Things overall have been stable.  He does meet minimal METS on the DASI (5.6).  Of note, the surgeon said he could continue his 81 mg of aspirin  throughout surgery.  No chest pains or SOB, no swelling. No skipped heart beats.   Surgical  clearance request did say that a baby aspirin  would need to be held, however speaking with patient today he said that baby aspirin  could be continued throughout the procedure.  This is what we would recommend.  Past Medical History    Past Medical History:  Diagnosis Date   Arthritis    JOINT PAIN RIGHT HAND   BPH (benign prostatic hyperplasia)    [name]/elevated PSA, prostate biopsy x4 including one saturation biopsy, laser treatment 2/14; NOCTURIA   CAD (coronary artery disease) 12/31/2011   cath 05/2012 showing 80-90% stenosis of a trifurcating diagonal #1, 70-80% stenosis of OM3 and 90% stenosis of distal LCx after OM3 - medical management, [place]   Cardiomyopathy (HCC)    dilated cardiomyopathy EF 30%, MUGA  EF 42% 08/2012   Carotid artery occlusion    carotid artery bruit   CHF (congestive heart failure) (HCC)    Chronic kidney disease    CKD3a   Heart murmur    as a child   History of kidney stones    History of shingles    Hyperlipidemia    statin intolerant   Hypertension    Lung cancer (HCC) 2013   Left upper lobectomy   Lung mass    Stage 1B non-small cell lung CA s/p resection 2013   Pre-diabetes    PVC (premature ventricular contraction)    Shingles    Sigmoid diverticulosis    Skin cancer    Multiple skin cancers    Squamous cell carcinoma, face    history of right face with mets  to right upper cheek in 2004  and facial lymph node reoccurence post surgery with XRT   Trigger finger    Bilateral   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  Cardiologist - Dr Shlomo   CARDIAC CATHETERIZATION Left 07/10/2016   Procedure: Left Heart Cath and Coronary Angiography;  Surgeon: Wolm JINNY Rhyme, MD;  Location: [facility] INVASIVE CV LAB;  Service: Cardiovascular;  Laterality: Left;   CATARACT EXTRACTION, BILATERAL     CORONARY  ARTERY BYPASS GRAFT N/A 07/19/2016   Procedure: CORONARY ARTERY BYPASS GRAFTING (CABG) x 4 (LIMA to LAD, SVG to DIAGONAL, SVG to CIRCUMFLEX, SVG to PDA) with EVH from RIGHT GREATER SAPHENOUS VEIN and LEFT INTERNAL MAMMARY ARTERY;  Surgeon: [name] KATHEE Jude, MD;  Location: [facility] OR;  Service: Open Heart Surgery;  Laterality: N/A;   excision ot metastatic lymph node followed by radiation  12/30/2004   .   GREEN LIGHT LASER TURP (TRANSURETHRAL RESECTION OF PROSTATE N/A 02/08/2013   Procedure: GREEN LIGHT LASER TURP (TRANSURETHRAL RESECTION OF PROSTATE;  Surgeon: Arlena LILLETTE Gal, MD;  Location: [facility];  Service: Urology;  Laterality: N/A;   HOLMIUM LASER APPLICATION Left 08/22/2014   Procedure: HOLMIUM LASER APPLICATION;  Surgeon: Arlena LILLETTE Gal, MD;  Location: [facility];  Service: Urology;  Laterality: Left;   LYMPH NODE DISSECTION  06/16/2012   Procedure: LYMPH NODE DISSECTION;  Surgeon: [name] KATHEE Jude, MD;  Location: [facility] OR;  Service: Thoracic;;   MOHS SURGERY  12/30/2002   REVERSE SHOULDER ARTHROPLASTY Right 09/24/2023   Procedure: REVERSE SHOULDER ARTHROPLASTY;  Surgeon: Cristy Bonner DASEN, MD;  Location: [facility] OR[facility];  Service: Orthopedics;  Laterality: Right;   RIB PLATING Right 07/19/2016   Procedure: Right sternoclavicular joint plating;  Surgeon: [name] KATHEE Jude, MD;  Location: [facility] OR;  Service: Open Heart Surgery;  Laterality: Right;   TEE WITHOUT CARDIOVERSION N/A 07/19/2016   Procedure: TRANSESOPHAGEAL ECHOCARDIOGRAM (TEE);  Surgeon: [name] KATHEE Jude, MD;  Location: [facility] OR;  Service: Open Heart Surgery;  Laterality: N/A;   TRANSURETHRAL RESECTION OF PROSTATE Left 08/22/2014   Procedure: TRANSURETHRAL RESECTION OF THE PROSTATE (TURP) CYSTOSCOPY, LEFT RETROGRADE PYLEGRAM,LEFT with STENT,URETEROSCOPY,PLACEMENT OF BACKSTOP,LASER FRAGMENTATION WITH BASKET EXTRACTION LEFT URETEROSCOPY;  Surgeon: Arlena LILLETTE Gal, MD;  Location: [facility];  Service: Urology;  Laterality: Left;    URETHROPLASTY  06/25/2024   done at [facility] by Dr. Levonia   URINARY SPHINCTER IMPLANT     removed   VIDEO BRONCHOSCOPY  06/16/2012   Procedure: VIDEO BRONCHOSCOPY;  Surgeon: [name] KATHEE Jude, MD;  Location: [facility] OR;  Service: Thoracic;  Laterality: N/A;   VIDEO BRONCHOSCOPY  06/24/2012   Procedure: VIDEO BRONCHOSCOPY;  Surgeon: [name] KATHEE Jude, MD;  Location: [facility] OR;  Service: Thoracic;  Laterality: N/A;    Allergies  Allergies  Allergen Reactions   Ramipril  Cough   Statins Other (See Comments)    Confusion Muscle pain     Home Medications    Prior to Admission medications   Medication Sig Start Date End Date Taking? Authorizing Provider  Ascorbic Acid (VITAMIN C) 1000 MG tablet Take 1,000 mg by mouth daily.    [provider]  aspirin  EC 81 MG tablet Take 1 tablet (81 mg total) by mouth daily. Swallow whole. 11/14/23   Lavona Agent, MD  carvedilol  (COREG ) 6.25 MG tablet TAKE 1 TABLET (6.25 MG  TOTAL) BY MOUTH 2 (TWO) TIMES DAILY. NEEDS OFFICE VISIT [phone] 09/14/18   Bensimhon, Toribio SAUNDERS, MD  ENTRESTO  49-51 MG TAKE 1 TABLET BY MOUTH TWICE A DAY. 09/15/24   Lavona Agent, MD  ezetimibe  (ZETIA ) 10 MG tablet TAKE 1 TABLET BY MOUTH EVERY DAY 08/04/24   Lavona Agent, MD  Flaxseed, Linseed, (FLAXSEED OIL) 1400 MG CAPS Take 1,400 mg by mouth daily.    [provider]  Garlic 1000 MG CAPS Take 1,000 mg by mouth daily at 12 noon.    [provider]  Multiple Vitamin (MULTIVITAMIN WITH MINERALS) TABS tablet Take 1 tablet by mouth daily.    [provider]  Multiple Vitamins-Minerals (PRESERVISION AREDS 2) CAPS Take 1 capsule by mouth in the morning and at bedtime.    [provider]  MYRBETRIQ 25 MG TB24 tablet Take 25 mg by mouth daily.    [provider]  nitroGLYCERIN  (NITROSTAT ) 0.4 MG SL tablet Place 1 tablet (0.4 mg total) under the tongue every 5 (five) minutes as needed for chest pain. 07/04/23   Lavona Agent, MD  Omega-3  Fatty Acids (FISH OIL) 1000 MG CAPS Take 1,000 mg by mouth in the morning and at bedtime.    [provider]  omeprazole  (PRILOSEC) 20 MG capsule Take 1 capsule (20 mg total) by mouth daily. To gastric protection while taking NSAIDs 09/25/23 08/24/24  Madelyn Barnie LABOR, PA-C  pravastatin  (PRAVACHOL ) 80 MG tablet Take 1 tablet (80 mg total) by mouth every evening. 07/09/24   Lavona Agent, MD  spironolactone  (ALDACTONE ) 25 MG tablet Take 1 tablet (25 mg total) by mouth daily. Call patient for office visit [phone] Patient taking differently: Take 12.5 mg by mouth daily. 05/26/18   Rolan Ezra RAMAN, MD  VITAMIN D PO Take 125 mcg by mouth daily.    [provider]  ZINC OXIDE PO Take 500 mg by mouth daily.    [provider]    Physical Exam    Vital Signs:  [name] [name] does not have vital signs available for review today.  Given telephonic nature of communication, physical exam is limited. AAOx3. NAD. Normal affect.  Speech and respirations are unlabored.  Accessory Clinical Findings    None  Assessment & Plan    1.  Preoperative Cardiovascular Risk Assessment: = [name] [name] perioperative risk of a major cardiac event is 6.6% according to the Revised Cardiac Risk Index (RCRI).  Therefore, he is at high risk for perioperative complications.   His functional capacity is good at 5.62 METs according to the [facility] Activity Status Index (DASI). Recommendations: According to ACC/AHA guidelines, no further cardiovascular testing needed.  The patient may proceed to surgery at acceptable risk.   Antiplatelet and/or Anticoagulation Recommendations: The patient should remain on Aspirin  without interruption.    The patient was advised that if he develops new symptoms prior to surgery to contact our office to arrange for a follow-up visit, and he verbalized understanding.   A copy of this note will be routed to requesting surgeon.  Time:   Today, I have spent 6  minutes with the patient with telehealth technology discussing medical history, symptoms, and management plan.     [name] LOISE Fabry, PA-C  10/11/2024, 8:27 AM

## 2024-10-11 NOTE — Anesthesia Preprocedure Evaluation (Addendum)
 Anesthesia Evaluation  Patient identified by MRN, date of birth, ID band Patient awake    Reviewed: Allergy & Precautions, NPO status , Patient's Chart, lab work & pertinent test results  Airway Mallampati: II  TM Distance: >3 FB Neck ROM: Full    Dental no notable dental hx.    Pulmonary neg pulmonary ROS, former smoker S/p LUL 2013   Pulmonary exam normal        Cardiovascular hypertension, Pt. on medications and Pt. on home beta blockers + CAD, + CABG (2017) and +CHF   Rhythm:Regular Rate:Normal  ECHO 2024: Echo 06/24/2023  1. Left ventricular ejection fraction, by estimation, is 40 to 45%. The  left ventricle has mildly decreased function. The left ventricle  demonstrates global hypokinesis. The left ventricular internal cavity size  was mildly dilated. Left ventricular  diastolic parameters are consistent with Grade II diastolic dysfunction  (pseudonormalization). The average left ventricular global longitudinal  strain is -14.7 %. The global longitudinal strain is abnormal.   2. Right ventricular systolic function is normal. The right ventricular  size is normal. There is normal pulmonary artery systolic pressure.   3. Left atrial size was moderately dilated.   4. The mitral valve is normal in structure. Trivial mitral valve  regurgitation. No evidence of mitral stenosis.   5. The aortic valve is tricuspid. There is mild calcification of the  aortic valve. There is mild thickening of the aortic valve. Aortic valve  regurgitation is not visualized. No aortic stenosis is present.   6. The inferior vena cava is normal in size with greater than 50%  respiratory variability, suggesting right atrial pressure of 3 mmHg.     Neuro/Psych  negative psych ROS   GI/Hepatic negative GI ROS, Neg liver ROS,,,  Endo/Other  negative endocrine ROS    Renal/GU CRFRenal disease  negative genitourinary   Musculoskeletal  (+)  Arthritis , Osteoarthritis,    Abdominal Normal abdominal exam  (+)   Peds  Hematology Lab Results      Component                Value               Date                      WBC                      7.1                 10/08/2024                HGB                      11.6 (L)            10/08/2024                HCT                      37.9 (L)            10/08/2024                MCV                      94.8                10/08/2024  PLT                      189                 10/08/2024             Lab Results      Component                Value               Date                      NA                       139                 10/08/2024                K                        4.9                 10/08/2024                CO2                      27                  10/08/2024                GLUCOSE                  84                  10/08/2024                BUN                      30 (H)              10/08/2024                CREATININE               1.04                10/08/2024                CALCIUM                  9.7                 10/08/2024                EGFR                     77                  10/15/2023                GFRNONAA                 >60                 10/08/2024              Anesthesia Other Findings   Reproductive/Obstetrics  Anesthesia Physical Anesthesia Plan  ASA: 3  Anesthesia Plan: General and Regional   Post-op Pain Management: Regional block*   Induction: Intravenous  PONV Risk Score and Plan: 2 and Ondansetron , Dexamethasone  and Treatment may vary due to age or medical condition  Airway Management Planned: Mask and Oral ETT  Additional Equipment: None  Intra-op Plan:   Post-operative Plan: Extubation in OR  Informed Consent: I have reviewed the patients History and Physical, chart, labs and discussed the procedure including the risks, benefits and  alternatives for the proposed anesthesia with the patient or authorized representative who has indicated his/her understanding and acceptance.     Dental advisory given  Plan Discussed with: CRNA  Anesthesia Plan Comments: (See PAT note 10/08/2024)         Anesthesia Quick Evaluation

## 2024-10-11 NOTE — Progress Notes (Signed)
 Anesthesia Chart Review   Case: [id number] Date/Time: 10/20/24 0815   Procedure: ARTHROPLASTY, SHOULDER, TOTAL, REVERSE (Left: Shoulder)   Anesthesia type: General   Diagnosis: Primary osteoarthritis of left shoulder [M19.012]   Pre-op diagnosis: osteoarthritis of left shoulder   Location: [facility]OM 09 / [facility]   Surgeons: Cristy Bonner DASEN, MD       DISCUSSION:88 y.o. former smoker with h/o HTN, CAD s/p CABG 2017, ischemic cardiomyopathy with an LVEF of 45%, lung cancer s/p left upper lobectomy 2013, CKD Stage III, left shoulder OA scheduled for above procedure 10/20/24 with Dr. Bonner Cristy.   Per cardiology preoperative evaluation 10/11/2024, Mr. Trebilcock perioperative risk of a major cardiac event is 6.6% according to the Revised Cardiac Risk Index (RCRI).  Therefore, he is at high risk for perioperative complications.   His functional capacity is good at 5.62 METs according to the Duke Activity Status Index (DASI). Recommendations: According to ACC/AHA guidelines, no further cardiovascular testing needed.  The patient may proceed to surgery at acceptable risk.   Antiplatelet and/or Anticoagulation Recommendations: The patient should remain on Aspirin  without interruption.  Pt seen by PCP 09/02/2024. Per notes he is cleared medically.  VS: BP (!) 126/58 Comment: right arm sitting  Pulse (!) 55   Temp 36.7 C (Oral)   Resp 14   Ht 5' 4 (1.626 m)   Wt 71.2 kg   SpO2 99%   BMI 26.95 kg/m   PROVIDERS: Ransom Other, MD is PCP   Cardiologist - Lynwood Schilling, MD  LABS: Labs reviewed: Acceptable for surgery. (all labs ordered are listed, but only abnormal results are displayed)  Labs Reviewed  SURGICAL PCR SCREEN - Abnormal; Notable for the following components:      Result Value   Staphylococcus aureus POSITIVE (*)    All other components within normal limits  BASIC METABOLIC PANEL WITH GFR - Abnormal; Notable for the following components:   BUN 30 (*)    All other components  within normal limits  CBC - Abnormal; Notable for the following components:   RBC 4.00 (*)    Hemoglobin 11.6 (*)    HCT 37.9 (*)    All other components within normal limits     IMAGES: VAS US  Carotid 06/22/2024 Summary:  Right Carotid: Velocities in the right ICA are consistent with a 40-59%                 stenosis. Non-hemodynamically significant plaque <50% noted  in                the CCA. The ECA appears >50% stenosed.   Left Carotid: Velocities in the left ICA are consistent with a 40-59%  stenosis.   Vertebrals: Bilateral vertebral arteries demonstrate antegrade flow.  Subclavians: Normal flow hemodynamics were seen in bilateral subclavian               arteries.   EKG:   CV: Echo 06/24/2023  1. Left ventricular ejection fraction, by estimation, is 40 to 45%. The  left ventricle has mildly decreased function. The left ventricle  demonstrates global hypokinesis. The left ventricular internal cavity size  was mildly dilated. Left ventricular  diastolic parameters are consistent with Grade II diastolic dysfunction  (pseudonormalization). The average left ventricular global longitudinal  strain is -14.7 %. The global longitudinal strain is abnormal.   2. Right ventricular systolic function is normal. The right ventricular  size is normal. There is normal pulmonary artery systolic pressure.   3.  Left atrial size was moderately dilated.   4. The mitral valve is normal in structure. Trivial mitral valve  regurgitation. No evidence of mitral stenosis.   5. The aortic valve is tricuspid. There is mild calcification of the  aortic valve. There is mild thickening of the aortic valve. Aortic valve  regurgitation is not visualized. No aortic stenosis is present.   6. The inferior vena cava is normal in size with greater than 50%  respiratory variability, suggesting right atrial pressure of 3 mmHg.   Past Medical History:  Diagnosis Date   Arthritis    JOINT PAIN RIGHT HAND    BPH (benign prostatic hyperplasia)    [name]/elevated PSA, prostate biopsy x4 including one saturation biopsy, laser treatment 2/14; NOCTURIA   CAD (coronary artery disease) 12/31/2011   cath 05/2012 showing 80-90% stenosis of a trifurcating diagonal #1, 70-80% stenosis of OM3 and 90% stenosis of distal LCx after OM3 - medical management, [place]   Cardiomyopathy (HCC)    dilated cardiomyopathy EF 30%, MUGA  EF 42% 08/2012   Carotid artery occlusion    carotid artery bruit   CHF (congestive heart failure) (HCC)    Chronic kidney disease    CKD3a   Heart murmur    as a child   History of kidney stones    History of shingles    Hyperlipidemia    statin intolerant   Hypertension    Lung cancer (HCC) 2013   Left upper lobectomy   Lung mass    Stage 1B non-small cell lung CA s/p resection 2013   Pre-diabetes    PVC (premature ventricular contraction)    Shingles    Sigmoid diverticulosis    Skin cancer    Multiple skin cancers    Squamous cell carcinoma, face    history of right face with mets to right upper cheek in 2004  and facial lymph node reoccurence post surgery with XRT   Trigger finger    Bilateral   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  Cardiologist - Dr Shlomo   CARDIAC CATHETERIZATION Left 07/10/2016   Procedure: Left Heart Cath and Coronary Angiography;  Surgeon: Wolm JINNY Rhyme, MD;  Location: [facility] INVASIVE CV LAB;  Service: Cardiovascular;  Laterality: Left;   CATARACT EXTRACTION, BILATERAL     CORONARY ARTERY BYPASS GRAFT N/A 07/19/2016   Procedure: CORONARY ARTERY BYPASS GRAFTING (CABG) x 4 (LIMA to LAD, SVG to DIAGONAL, SVG to CIRCUMFLEX, SVG to PDA) with EVH from RIGHT GREATER SAPHENOUS VEIN and LEFT INTERNAL MAMMARY ARTERY;  Surgeon: Dallas KATHEE Jude, MD;  Location: [facility] OR;  Service: Open Heart Surgery;   Laterality: N/A;   excision ot metastatic lymph node followed by radiation  12/30/2004   .   GREEN LIGHT LASER TURP (TRANSURETHRAL RESECTION OF PROSTATE N/A 02/08/2013   Procedure: GREEN LIGHT LASER TURP (TRANSURETHRAL RESECTION OF PROSTATE;  Surgeon: Arlena LILLETTE Gal, MD;  Location: [facility];  Service: Urology;  Laterality: N/A;   HOLMIUM LASER APPLICATION Left 08/22/2014   Procedure: HOLMIUM LASER APPLICATION;  Surgeon: Arlena LILLETTE Gal, MD;  [facility];  Service: Urology;  Laterality: Left;   LYMPH NODE DISSECTION  06/16/2012   Procedure: LYMPH NODE DISSECTION;  Surgeon: Dallas KATHEE Jude, MD;  Location: [facility] OR;  Service: Thoracic;;   MOHS SURGERY  12/30/2002  REVERSE SHOULDER ARTHROPLASTY Right 09/24/2023   Procedure: REVERSE SHOULDER ARTHROPLASTY;  Surgeon: Cristy Bonner DASEN, MD;  [facility];  Service: Orthopedics;  Laterality: Right;   RIB PLATING Right 07/19/2016   Procedure: Right sternoclavicular joint plating;  Surgeon: Dallas KATHEE Jude, MD;  Location: [facility] OR;  Service: Open Heart Surgery;  Laterality: Right;   TEE WITHOUT CARDIOVERSION N/A 07/19/2016   Procedure: TRANSESOPHAGEAL ECHOCARDIOGRAM (TEE);  Surgeon: Dallas KATHEE Jude, MD;  Location: [facility] OR;  Service: Open Heart Surgery;  Laterality: N/A;   TRANSURETHRAL RESECTION OF PROSTATE Left 08/22/2014   Procedure: TRANSURETHRAL RESECTION OF THE PROSTATE (TURP) CYSTOSCOPY, LEFT RETROGRADE PYLEGRAM,LEFT with STENT,URETEROSCOPY,PLACEMENT OF BACKSTOP,LASER FRAGMENTATION WITH BASKET EXTRACTION LEFT URETEROSCOPY;  Surgeon: Arlena LILLETTE Gal, MD;  [facility];  Service: Urology;  Laterality: Left;   URETHROPLASTY  06/25/2024   done at [facility] by Dr. Levonia   URINARY SPHINCTER IMPLANT     removed   VIDEO BRONCHOSCOPY  06/16/2012   Procedure: VIDEO BRONCHOSCOPY;  Surgeon: Dallas KATHEE Jude, MD;  Location: [facility] OR;  Service: Thoracic;  Laterality: N/A;   VIDEO BRONCHOSCOPY  06/24/2012   Procedure: VIDEO  BRONCHOSCOPY;  Surgeon: Dallas KATHEE Jude, MD;  Location: [facility] OR;  Service: Thoracic;  Laterality: N/A;    MEDICATIONS:  Ascorbic Acid (VITAMIN C) 1000 MG tablet   aspirin  EC 81 MG tablet   carvedilol  (COREG ) 6.25 MG tablet   ENTRESTO  49-51 MG   ezetimibe  (ZETIA ) 10 MG tablet   Flaxseed, Linseed, (FLAXSEED OIL) 1400 MG CAPS   Garlic 1000 MG CAPS   Multiple Vitamin (MULTIVITAMIN WITH MINERALS) TABS tablet   Multiple Vitamins-Minerals (PRESERVISION AREDS 2) CAPS   MYRBETRIQ 25 MG TB24 tablet   nitroGLYCERIN  (NITROSTAT ) 0.4 MG SL tablet   Omega-3 Fatty Acids (FISH OIL) 1000 MG CAPS   omeprazole  (PRILOSEC) 20 MG capsule   pravastatin  (PRAVACHOL ) 80 MG tablet   spironolactone  (ALDACTONE ) 25 MG tablet   VITAMIN D PO   ZINC OXIDE PO   No current facility-administered medications for this encounter.     Harlene Hoots Ward, PA-C [facility] [phone]

## 2024-10-12 DIAGNOSIS — R051 Acute cough: Secondary | ICD-10-CM | POA: Diagnosis not present

## 2024-10-14 NOTE — Care Plan (Signed)
 Ortho Bundle Case Management Note  Patient Details  Name: [name] [name] MRN: [id number] Date of Birth: 05/21/1936   patient returned call. will discharge to home with family to assist. no DME needed. OPPT set up with [facility]. discharge instructions mailed and questions answered. Patient and MD in agreement with plan. Choice offered                   DME Arranged:    DME Agency:     HH Arranged:    HH Agency:     Additional Comments: Please contact me with any questions of if this plan should need to change.  Charlies Pitch,  RN,BSN,MHA,CCM  [facility] Orthopaedic Specialist  [phone] 10/14/2024, 1:58 PM

## 2024-10-19 DIAGNOSIS — R399 Unspecified symptoms and signs involving the genitourinary system: Secondary | ICD-10-CM | POA: Diagnosis not present

## 2024-10-19 NOTE — H&P (Signed)
 PREOPERATIVE H&P  Chief Complaint: osteoarthritis of left shoulder  HPI: [name] [name] is a 88 y.o. male who presents for preoperative history and physical prior to scheduled surgery, Procedure(s): ARTHROPLASTY, SHOULDER, TOTAL, REVERSE.   Patient has a past medical history significant for CAD, HFrEF (EF 30%), CKD, s/p lobectomy for NSCLC 2013.   [name] [name] is an 88 year old gentleman with a history of right total shoulder arthroplasty in September 2024.  He denies pain in the right shoulder currently.  He is pleased with his range of motion and has no concerns regarding the right shoulder.  Regarding his left shoulder, he has reduced range of motion and has previously discussed left shoulder arthroplasty.  He is ready to proceed with this as his range of motion has continued to gradually regress over the last four years.  He denies pain in the left shoulder, but range of motion is significantly reduced.  Symptoms are rated as moderate to severe, and have been worsening.  This is significantly impairing activities of daily living.    Please see clinic note for further details on this patient's care.    He has elected for surgical management.   Past Medical History:  Diagnosis Date   Arthritis    JOINT PAIN RIGHT HAND   BPH (benign prostatic hyperplasia)    [name]/elevated PSA, prostate biopsy x4 including one saturation biopsy, laser treatment 2/14; NOCTURIA   CAD (coronary artery disease) 12/31/2011   cath 05/2012 showing 80-90% stenosis of a trifurcating diagonal #1, 70-80% stenosis of OM3 and 90% stenosis of distal LCx after OM3 - medical management, [name] [name]   Cardiomyopathy (HCC)    dilated cardiomyopathy EF 30%, MUGA  EF 42% 08/2012   Carotid artery occlusion    carotid artery bruit   CHF (congestive heart failure) (HCC)    Chronic kidney disease    CKD3a   Heart murmur    as a child   History of kidney stones    History of shingles    Hyperlipidemia    statin  intolerant   Hypertension    Lung cancer (HCC) 2013   Left upper lobectomy   Lung mass    Stage 1B non-small cell lung CA s/p resection 2013   Pre-diabetes    PVC (premature ventricular contraction)    Shingles    Sigmoid diverticulosis    Skin cancer    Multiple skin cancers    Squamous cell carcinoma, face    history of right face with mets to right upper cheek in 2004  and facial lymph node reoccurence post surgery with XRT   Trigger finger    Bilateral   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  Cardiologist - Dr Shlomo   CARDIAC CATHETERIZATION Left 07/10/2016   Procedure: Left Heart Cath and Coronary Angiography;  Surgeon: Wolm JINNY Rhyme, MD;  Location: [facility] INVASIVE CV LAB;  Service: Cardiovascular;  Laterality: Left;   CATARACT EXTRACTION, BILATERAL     CORONARY ARTERY BYPASS GRAFT N/A 07/19/2016   Procedure: CORONARY ARTERY BYPASS GRAFTING (CABG) x 4 (LIMA to LAD, SVG to DIAGONAL, SVG to CIRCUMFLEX, SVG to PDA) with EVH from RIGHT GREATER SAPHENOUS VEIN and LEFT INTERNAL MAMMARY ARTERY;  Surgeon: Dallas KATHEE Jude, MD;  Location: [facility] OR;  Service: Open Heart Surgery;  Laterality: N/A;   excision ot metastatic lymph node  followed by radiation  12/30/2004   .   GREEN LIGHT LASER TURP (TRANSURETHRAL RESECTION OF PROSTATE N/A 02/08/2013   Procedure: GREEN LIGHT LASER TURP (TRANSURETHRAL RESECTION OF PROSTATE;  Surgeon: Arlena LILLETTE Gal, MD;  Location: [facility];  Service: Urology;  Laterality: N/A;   HOLMIUM LASER APPLICATION Left 08/22/2014   Procedure: HOLMIUM LASER APPLICATION;  Surgeon: Arlena LILLETTE Gal, MD;  [facility];  Service: Urology;  Laterality: Left;   LYMPH NODE DISSECTION  06/16/2012   Procedure: LYMPH NODE DISSECTION;  Surgeon: Dallas KATHEE Jude, MD;  Location: [facility] OR;  Service: Thoracic;;    MOHS SURGERY  12/30/2002   REVERSE SHOULDER ARTHROPLASTY Right 09/24/2023   Procedure: REVERSE SHOULDER ARTHROPLASTY;  Surgeon: Cristy Bonner DASEN, MD;  Location[facility];  Service: Orthopedics;  Laterality: Right;   RIB PLATING Right 07/19/2016   Procedure: Right sternoclavicular joint plating;  Surgeon: Dallas KATHEE Jude, MD;  Location: [facility] OR;  Service: Open Heart Surgery;  Laterality: Right;   TEE WITHOUT CARDIOVERSION N/A 07/19/2016   Procedure: TRANSESOPHAGEAL ECHOCARDIOGRAM (TEE);  Surgeon: Dallas KATHEE Jude, MD;  Location: [facility] OR;  Service: Open Heart Surgery;  Laterality: N/A;   TRANSURETHRAL RESECTION OF PROSTATE Left 08/22/2014   Procedure: TRANSURETHRAL RESECTION OF THE PROSTATE (TURP) CYSTOSCOPY, LEFT RETROGRADE PYLEGRAM,LEFT with STENT,URETEROSCOPY,PLACEMENT OF BACKSTOP,LASER FRAGMENTATION WITH BASKET EXTRACTION LEFT URETEROSCOPY;  Surgeon: Arlena LILLETTE Gal, MD;  [facility];  Service: Urology;  Laterality: Left;   URETHROPLASTY  06/25/2024   done at [facility] by Dr. Levonia   URINARY SPHINCTER IMPLANT     removed   VIDEO BRONCHOSCOPY  06/16/2012   Procedure: VIDEO BRONCHOSCOPY;  Surgeon: Dallas KATHEE Jude, MD;  Location: [facility] OR;  Service: Thoracic;  Laterality: N/A;   VIDEO BRONCHOSCOPY  06/24/2012   Procedure: VIDEO BRONCHOSCOPY;  Surgeon: Dallas KATHEE Jude, MD;  Location: [facility] OR;  Service: Thoracic;  Laterality: N/A;   Social History   Socioeconomic History   Marital status: Married    Spouse name: Not on file   Number of children: Not on file   Years of education: Not on file   Highest education level: Not on file  Occupational History   Not on file  Tobacco Use   Smoking status: Former    Current packs/day: 0.00    Types: Cigarettes    Quit date: 12/30/1965    Years since quitting: 58.8   Smokeless tobacco: Never  Vaping Use   Vaping status: Never Used  Substance and Sexual Activity   Alcohol use: Yes    Alcohol/week: 5.0 - 6.0 standard drinks of alcohol    Types: 3 -  4 Glasses of wine, 2 Cans of beer per week    Comment: infrequently- indulge heavly when I do   Drug use: No   Sexual activity: Not Currently  Other Topics Concern   Not on file  Social History Narrative   Lives at home with wife.  Two children.     Social Drivers of Corporate investment banker Strain: Not on file  Food Insecurity: No Food Insecurity (09/24/2023)   Hunger Vital Sign    Worried About Running Out of Food in the Last Year: Never true    Ran Out of Food in the Last Year: Never true  Transportation Needs: No Transportation Needs (09/24/2023)   PRAPARE - Administrator, Civil Service (Medical): No    Lack of Transportation (Non-Medical): No  Physical Activity: Not on file  Stress: Not  on file  Social Connections: Not on file   Family History  Problem Relation Age of Onset   Diabetes Father    Psoriasis Child    Heart disease Child        resuscitated from cardiac arrest from Brugada's syndrome   Lung cancer Mother    Heart disease Mother    Allergies  Allergen Reactions   Ramipril  Cough   Statins Other (See Comments)    Confusion Muscle pain    Prior to Admission medications   Medication Sig Start Date End Date Taking? Authorizing Provider  Ascorbic Acid (VITAMIN C) 1000 MG tablet Take 1,000 mg by mouth daily.   Yes [provider]  aspirin  EC 81 MG tablet Take 1 tablet (81 mg total) by mouth daily. Swallow whole. 11/14/23  Yes Lavona Agent, MD  carvedilol  (COREG ) 6.25 MG tablet TAKE 1 TABLET (6.25 MG TOTAL) BY MOUTH 2 (TWO) TIMES DAILY. NEEDS OFFICE VISIT [phone] 09/14/18  Yes Bensimhon, Toribio SAUNDERS, MD  ENTRESTO  49-51 MG TAKE 1 TABLET BY MOUTH TWICE A DAY. 09/15/24  Yes Lavona Agent, MD  ezetimibe  (ZETIA ) 10 MG tablet TAKE 1 TABLET BY MOUTH EVERY DAY 08/04/24  Yes Hochrein, Agent, MD  Flaxseed, Linseed, (FLAXSEED OIL) 1400 MG CAPS Take 1,400 mg by mouth daily.   Yes [provider]  Garlic 1000 MG CAPS Take 1,000 mg by  mouth daily at 12 noon.   Yes [provider]  Multiple Vitamin (MULTIVITAMIN WITH MINERALS) TABS tablet Take 1 tablet by mouth daily.   Yes [provider]  Multiple Vitamins-Minerals (PRESERVISION AREDS 2) CAPS Take 1 capsule by mouth in the morning and at bedtime.   Yes [provider]  MYRBETRIQ 25 MG TB24 tablet Take 25 mg by mouth daily.   Yes [provider]  nitroGLYCERIN  (NITROSTAT ) 0.4 MG SL tablet Place 1 tablet (0.4 mg total) under the tongue every 5 (five) minutes as needed for chest pain. 07/04/23  Yes Lavona Agent, MD  Omega-3 Fatty Acids (FISH OIL) 1000 MG CAPS Take 1,000 mg by mouth in the morning and at bedtime.   Yes [provider]  pravastatin  (PRAVACHOL ) 80 MG tablet Take 1 tablet (80 mg total) by mouth every evening. 07/09/24  Yes Lavona Agent, MD  spironolactone  (ALDACTONE ) 25 MG tablet Take 1 tablet (25 mg total) by mouth daily. Call patient for office visit [phone] Patient taking differently: Take 12.5 mg by mouth daily. 05/26/18  Yes Rolan Ezra RAMAN, MD  VITAMIN D PO Take 125 mcg by mouth daily.   Yes [provider]  ZINC OXIDE PO Take 500 mg by mouth daily.   Yes [provider]  omeprazole  (PRILOSEC) 20 MG capsule Take 1 capsule (20 mg total) by mouth daily. To gastric protection while taking NSAIDs 09/25/23 08/24/24  Madelyn Sober A, PA-C    ROS: All other systems have been reviewed and were otherwise negative with the exception of those mentioned in the HPI and as above.  Physical Exam: General: Alert, no acute distress Cardiovascular: No pedal edema Respiratory: No cyanosis, no use of accessory musculature GI: No organomegaly, abdomen is soft and non-tender Skin: No lesions in the area of chief complaint Neurologic: Sensation intact distally Psychiatric: Patient is competent for consent with normal mood and affect Lymphatic: No axillary or cervical lymphadenopathy  MUSCULOSKELETAL:  Left  shoulder:  No obvious deformity on inspection.  There is no tenderness to palpation.  Active forward flexion and abduction approach 90 degrees, external  rotation to 35 degrees, and internal rotation to L4.  Strength is grossly intact.  The left upper extremity is grossly neurovascularly intact.  Imaging: Bilateral shoulder x-rays ordered and interpreted by me today show well-seated hardware in the right shoulder and significant glenohumeral osteoarthritis of the left shoulder.  BMI: Estimated body mass index is 26.95 kg/m as calculated from the following:   Height as of 10/08/24: 5' 4 (1.626 m).   Weight as of 10/08/24: 71.2 kg.  Lab Results  Component Value Date   ALBUMIN  3.7 10/15/2023   Diabetes: Patient does not have a diagnosis of diabetes.     Smoking Status:      Assessment: osteoarthritis of left shoulder  Plan: Plan for Procedure(s): ARTHROPLASTY, SHOULDER, TOTAL, REVERSE  The risks benefits and alternatives were discussed with the patient including but not limited to the risks of nonoperative treatment, versus surgical intervention including infection, bleeding, nerve injury,  blood clots, cardiopulmonary complications, morbidity, mortality, among others, and they were willing to proceed.   We additionally specifically discussed risks of axillary nerve injury, infection, periprosthetic fracture, continued pain and longevity of implants prior to beginning procedure.    Patient will be admitted for inpatient treatment for surgery, pain control, OT, prophylactic antibiotics, VTE prophylaxis, and discharge planning. The patient is planning to be discharged home with outpatient PT.   The patient acknowledged the explanation, agreed to proceed with the plan and consent was signed.   Operative Plan: left reverse total shoulder arthroplasty Discharge Medications: standard DVT Prophylaxis: aspirin  Physical Therapy: outpatient Special Discharge needs: Sling (should bring  with him). IceMan   Aleck LOISE Stalling, PA-C  10/19/2024 3:52 PM

## 2024-10-20 ENCOUNTER — Encounter (HOSPITAL_COMMUNITY)
Admission: RE | Disposition: A | Discharge status: Home / Self Care | Payer: Self-pay | Source: Home / Self Care | Attending: Orthopaedic Surgery

## 2024-10-20 ENCOUNTER — Ambulatory Visit (HOSPITAL_COMMUNITY): Payer: Self-pay | Admitting: Physician Assistant

## 2024-10-20 ENCOUNTER — Ambulatory Visit (HOSPITAL_COMMUNITY)

## 2024-10-20 ENCOUNTER — Ambulatory Visit (HOSPITAL_COMMUNITY): Admitting: Certified Registered"

## 2024-10-20 ENCOUNTER — Ambulatory Visit (HOSPITAL_COMMUNITY)
Admission: RE | Admit: 2024-10-20 | Discharge: 2024-10-20 | Disposition: A | Discharge status: Home / Self Care | Attending: Orthopaedic Surgery | Admitting: Orthopaedic Surgery

## 2024-10-20 ENCOUNTER — Encounter (HOSPITAL_COMMUNITY): Payer: Self-pay | Admitting: Orthopaedic Surgery

## 2024-10-20 ENCOUNTER — Other Ambulatory Visit (HOSPITAL_COMMUNITY): Payer: Self-pay

## 2024-10-20 DIAGNOSIS — Z951 Presence of aortocoronary bypass graft: Secondary | ICD-10-CM | POA: Insufficient documentation

## 2024-10-20 DIAGNOSIS — I130 Hypertensive heart and chronic kidney disease with heart failure and stage 1 through stage 4 chronic kidney disease, or unspecified chronic kidney disease: Secondary | ICD-10-CM | POA: Diagnosis not present

## 2024-10-20 DIAGNOSIS — Z85118 Personal history of other malignant neoplasm of bronchus and lung: Secondary | ICD-10-CM | POA: Diagnosis not present

## 2024-10-20 DIAGNOSIS — I2510 Atherosclerotic heart disease of native coronary artery without angina pectoris: Secondary | ICD-10-CM | POA: Insufficient documentation

## 2024-10-20 DIAGNOSIS — Z471 Aftercare following joint replacement surgery: Secondary | ICD-10-CM | POA: Diagnosis not present

## 2024-10-20 DIAGNOSIS — Z87891 Personal history of nicotine dependence: Secondary | ICD-10-CM | POA: Insufficient documentation

## 2024-10-20 DIAGNOSIS — G8918 Other acute postprocedural pain: Secondary | ICD-10-CM | POA: Diagnosis not present

## 2024-10-20 DIAGNOSIS — I10 Essential (primary) hypertension: Secondary | ICD-10-CM | POA: Diagnosis not present

## 2024-10-20 DIAGNOSIS — Z902 Acquired absence of lung [part of]: Secondary | ICD-10-CM | POA: Insufficient documentation

## 2024-10-20 DIAGNOSIS — N1831 Chronic kidney disease, stage 3a: Secondary | ICD-10-CM | POA: Insufficient documentation

## 2024-10-20 DIAGNOSIS — Z79899 Other long term (current) drug therapy: Secondary | ICD-10-CM | POA: Insufficient documentation

## 2024-10-20 DIAGNOSIS — M19012 Primary osteoarthritis, left shoulder: Secondary | ICD-10-CM | POA: Diagnosis not present

## 2024-10-20 DIAGNOSIS — I5022 Chronic systolic (congestive) heart failure: Secondary | ICD-10-CM | POA: Insufficient documentation

## 2024-10-20 DIAGNOSIS — Z96612 Presence of left artificial shoulder joint: Secondary | ICD-10-CM | POA: Diagnosis not present

## 2024-10-20 DIAGNOSIS — Z96611 Presence of right artificial shoulder joint: Secondary | ICD-10-CM | POA: Diagnosis not present

## 2024-10-20 HISTORY — PX: REVERSE SHOULDER ARTHROPLASTY: SHX5054

## 2024-10-20 SURGERY — ARTHROPLASTY, SHOULDER, TOTAL, REVERSE
Anesthesia: Regional | Site: Shoulder | Laterality: Left

## 2024-10-20 MED ORDER — ONDANSETRON HCL 4 MG/2ML IJ SOLN
INTRAMUSCULAR | Status: DC | PRN
  Administered 2024-10-20: 4 mg via INTRAVENOUS

## 2024-10-20 MED ORDER — LACTATED RINGERS IV SOLN: INTRAVENOUS | Status: DC

## 2024-10-20 MED ORDER — EPHEDRINE SULFATE (PRESSORS) 25 MG/5ML IV SOSY
PREFILLED_SYRINGE | INTRAVENOUS | Status: DC | PRN
  Administered 2024-10-20: 10 mg via INTRAVENOUS
  Administered 2024-10-20: 5 mg via INTRAVENOUS
  Administered 2024-10-20: 10 mg via INTRAVENOUS

## 2024-10-20 MED ORDER — ROCURONIUM 10MG/ML (10ML) SYRINGE FOR MEDFUSION PUMP - OPTIME
INTRAVENOUS | Status: DC | PRN
  Administered 2024-10-20: 60 mg via INTRAVENOUS

## 2024-10-20 MED ORDER — FENTANYL CITRATE (PF) 250 MCG/5ML IJ SOLN
INTRAMUSCULAR | Status: DC | PRN
  Administered 2024-10-20 (×2): 50 ug via INTRAVENOUS

## 2024-10-20 MED ORDER — ORAL CARE MOUTH RINSE: 15.0000 mL | Freq: Once | OROMUCOSAL | Status: AC

## 2024-10-20 MED ORDER — BUPIVACAINE LIPOSOME 1.3 % IJ SUSP
INTRAMUSCULAR | Status: DC | PRN
  Administered 2024-10-20: 10 mL via PERINEURAL

## 2024-10-20 MED ORDER — DEXAMETHASONE SOD PHOSPHATE PF 10 MG/ML IJ SOLN
INTRAMUSCULAR | Status: DC | PRN
  Administered 2024-10-20: 10 mg via INTRAVENOUS

## 2024-10-20 MED ORDER — CEFAZOLIN SODIUM-DEXTROSE 2-4 GM/100ML-% IV SOLN
2.0000 g | INTRAVENOUS | Status: AC
  Administered 2024-10-20: 2 g via INTRAVENOUS
  Filled 2024-10-20: qty 100

## 2024-10-20 MED ORDER — PHENYLEPHRINE HCL-NACL 20-0.9 MG/250ML-% IV SOLN
INTRAVENOUS | Status: DC | PRN
Start: 2024-10-20 — End: 2024-10-20
  Administered 2024-10-20: 50 ug/min via INTRAVENOUS

## 2024-10-20 MED ORDER — LIDOCAINE 2% (20 MG/ML) 5 ML SYRINGE
INTRAMUSCULAR | Status: DC | PRN
  Administered 2024-10-20: 60 mg via INTRAVENOUS

## 2024-10-20 MED ORDER — FENTANYL CITRATE (PF) 100 MCG/2ML IJ SOLN
INTRAMUSCULAR | Status: AC
  Filled 2024-10-20: qty 2

## 2024-10-20 MED ORDER — ONDANSETRON HCL 4 MG/2ML IJ SOLN
INTRAMUSCULAR | Status: AC
Start: 2024-10-20 — End: 2024-10-20
  Filled 2024-10-20: qty 2

## 2024-10-20 MED ORDER — ACETAMINOPHEN 10 MG/ML IV SOLN: 1000.0000 mg | Freq: Once | INTRAVENOUS | Status: DC | PRN

## 2024-10-20 MED ORDER — ACETAMINOPHEN 500 MG PO TABS
1000.0000 mg | ORAL_TABLET | Freq: Once | ORAL | Status: AC
  Administered 2024-10-20: 1000 mg via ORAL
  Filled 2024-10-20: qty 2

## 2024-10-20 MED ORDER — FENTANYL CITRATE (PF) 50 MCG/ML IJ SOSY
50.0000 ug | PREFILLED_SYRINGE | Freq: Once | INTRAMUSCULAR | Status: AC
Start: 2024-10-20 — End: 2024-10-20
  Administered 2024-10-20: 50 ug via INTRAVENOUS
  Filled 2024-10-20: qty 2

## 2024-10-20 MED ORDER — PROPOFOL 10 MG/ML IV BOLUS
INTRAVENOUS | Status: DC | PRN
  Administered 2024-10-20: 150 mg via INTRAVENOUS

## 2024-10-20 MED ORDER — PROPOFOL 10 MG/ML IV BOLUS
INTRAVENOUS | Status: AC
  Filled 2024-10-20: qty 20

## 2024-10-20 MED ORDER — TRANEXAMIC ACID-NACL 1000-0.7 MG/100ML-% IV SOLN
1000.0000 mg | INTRAVENOUS | Status: AC
  Administered 2024-10-20: 1000 mg via INTRAVENOUS
  Filled 2024-10-20: qty 100

## 2024-10-20 MED ORDER — CHLORHEXIDINE GLUCONATE 0.12 % MT SOLN
15.0000 mL | Freq: Once | OROMUCOSAL | Status: AC
  Administered 2024-10-20: 15 mL via OROMUCOSAL

## 2024-10-20 MED ORDER — OXYCODONE HCL 5 MG PO TABS
ORAL_TABLET | ORAL | 0 refills | Status: AC
  Filled 2024-10-20 (×2): qty 30, 7d supply, fill #0

## 2024-10-20 MED ORDER — ALBUMIN HUMAN 5 % IV SOLN
12.5000 g | Freq: Once | INTRAVENOUS | Status: AC
  Administered 2024-10-20: 12.5 g via INTRAVENOUS

## 2024-10-20 MED ORDER — VANCOMYCIN HCL 1 G IV SOLR
INTRAVENOUS | Status: DC | PRN
  Administered 2024-10-20: 1000 mg via TOPICAL

## 2024-10-20 MED ORDER — ONDANSETRON HCL 4 MG PO TABS
4.0000 mg | ORAL_TABLET | Freq: Three times a day (TID) | ORAL | 0 refills | Status: AC | PRN
  Filled 2024-10-20: qty 10, 4d supply, fill #0

## 2024-10-20 MED ORDER — LIDOCAINE HCL (PF) 2 % IJ SOLN
INTRAMUSCULAR | Status: AC
  Filled 2024-10-20: qty 5

## 2024-10-20 MED ORDER — PHENYLEPHRINE HCL (PRESSORS) 10 MG/ML IV SOLN
INTRAVENOUS | Status: DC | PRN
  Administered 2024-10-20: 80 ug via INTRAVENOUS

## 2024-10-20 MED ORDER — BUPIVACAINE HCL (PF) 0.5 % IJ SOLN
INTRAMUSCULAR | Status: DC | PRN
  Administered 2024-10-20: 10 mL via PERINEURAL

## 2024-10-20 MED ORDER — ROCURONIUM BROMIDE 10 MG/ML (PF) SYRINGE
PREFILLED_SYRINGE | INTRAVENOUS | Status: AC
  Filled 2024-10-20: qty 10

## 2024-10-20 MED ORDER — ASPIRIN 81 MG PO CHEW
81.0000 mg | CHEWABLE_TABLET | Freq: Two times a day (BID) | ORAL | 0 refills | Status: AC
  Filled 2024-10-20: qty 84, 42d supply, fill #0

## 2024-10-20 MED ORDER — PHENYLEPHRINE HCL-NACL 20-0.9 MG/250ML-% IV SOLN
INTRAVENOUS | Status: AC
Start: 2024-10-20 — End: 2024-10-20
  Filled 2024-10-20: qty 750

## 2024-10-20 MED ORDER — SODIUM CHLORIDE 0.9 % IR SOLN
Status: DC | PRN
  Administered 2024-10-20: 1000 mL

## 2024-10-20 MED ORDER — SUGAMMADEX SODIUM 200 MG/2ML IV SOLN
INTRAVENOUS | Status: DC | PRN
  Administered 2024-10-20: 200 mg via INTRAVENOUS

## 2024-10-20 MED ORDER — ACETAMINOPHEN 500 MG PO TABS
1000.0000 mg | ORAL_TABLET | Freq: Three times a day (TID) | ORAL | 0 refills | Status: AC
  Filled 2024-10-20: qty 84, 14d supply, fill #0

## 2024-10-20 MED ORDER — VANCOMYCIN HCL 1000 MG IV SOLR
INTRAVENOUS | Status: AC
  Filled 2024-10-20: qty 20

## 2024-10-20 MED ORDER — STERILE WATER FOR IRRIGATION IR SOLN
Status: DC | PRN
Start: 2024-10-20 — End: 2024-10-20
  Administered 2024-10-20: 2000 mL

## 2024-10-20 MED ORDER — GLYCOPYRROLATE 0.2 MG/ML IJ SOLN
INTRAMUSCULAR | Status: DC | PRN
  Administered 2024-10-20: .2 mg via INTRAVENOUS

## 2024-10-20 MED ORDER — ALBUMIN HUMAN 5 % IV SOLN
INTRAVENOUS | Status: AC
Start: 2024-10-20 — End: 2024-10-20
  Filled 2024-10-20: qty 250

## 2024-10-20 MED ORDER — FENTANYL CITRATE (PF) 50 MCG/ML IJ SOSY: 25.0000 ug | PREFILLED_SYRINGE | INTRAMUSCULAR | Status: DC | PRN

## 2024-10-20 MED ORDER — 0.9 % SODIUM CHLORIDE (POUR BTL) OPTIME
TOPICAL | Status: DC | PRN
  Administered 2024-10-20: 1000 mL

## 2024-10-20 MED ORDER — MELOXICAM 7.5 MG PO TABS
7.5000 mg | ORAL_TABLET | Freq: Every day | ORAL | 0 refills | Status: AC
  Filled 2024-10-20: qty 30, 30d supply, fill #0

## 2024-10-20 SURGICAL SUPPLY — 54 items
BAG COUNTER SPONGE SURGICOUNT (BAG) ×1 IMPLANT
BASEPLATE GLENOID RSA 3X25 0D (Shoulder) IMPLANT
BIT DRILL 3.2 PERIPHERAL SCREW (BIT) IMPLANT
BLADE SAW SGTL 73X25 THK (BLADE) ×1 IMPLANT
CHLORAPREP W/TINT 26 (MISCELLANEOUS) ×2 IMPLANT
CLSR STERI-STRIP ANTIMIC 1/2X4 (GAUZE/BANDAGES/DRESSINGS) ×1 IMPLANT
COOLER ICEMAN CLASSIC (MISCELLANEOUS) ×1 IMPLANT
COVER BACK TABLE 60X90IN (DRAPES) ×1 IMPLANT
COVER SURGICAL LIGHT HANDLE (MISCELLANEOUS) ×1 IMPLANT
DRAPE 3/4 80X56 (DRAPES) ×1 IMPLANT
DRAPE C-ARM 42X120 X-RAY (DRAPES) IMPLANT
DRAPE INCISE IOBAN 66X45 STRL (DRAPES) ×1 IMPLANT
DRAPE POUCH INSTRU U-SHP 10X18 (DRAPES) ×1 IMPLANT
DRAPE SHEET LG 3/4 BI-LAMINATE (DRAPES) ×2 IMPLANT
DRAPE SURG ORHT 6 SPLT 77X108 (DRAPES) ×2 IMPLANT
DRAPE TOP 10253 STERILE (DRAPES) ×1 IMPLANT
DRSG AQUACEL AG ADV 3.5X 6 (GAUZE/BANDAGES/DRESSINGS) ×1 IMPLANT
ELECT BLADE TIP CTD 4 INCH (ELECTRODE) ×1 IMPLANT
ELECT PENCIL ROCKER SW 15FT (MISCELLANEOUS) ×1 IMPLANT
ELECT REM PT RETURN 15FT ADLT (MISCELLANEOUS) ×1 IMPLANT
FACESHIELD WRAPAROUND OR TEAM (MASK) ×3 IMPLANT
GLOVE BIO SURGEON STRL SZ 6.5 (GLOVE) ×2 IMPLANT
GLOVE BIOGEL PI IND STRL 6.5 (GLOVE) ×1 IMPLANT
GLOVE BIOGEL PI IND STRL 8 (GLOVE) ×1 IMPLANT
GLOVE ECLIPSE 8.0 STRL XLNG CF (GLOVE) ×2 IMPLANT
GOWN STRL REUS W/ TWL LRG LVL3 (GOWN DISPOSABLE) ×2 IMPLANT
GUIDEWIRE GLENOID 2.5X220 (WIRE) IMPLANT
INSERT HUM PERF 3/4 39 +0 (Insert) IMPLANT
KIT BASIN OR (CUSTOM PROCEDURE TRAY) ×1 IMPLANT
KIT STABILIZATION SHOULDER (MISCELLANEOUS) ×1 IMPLANT
KIT TURNOVER KIT A (KITS) ×1 IMPLANT
MANIFOLD NEPTUNE II (INSTRUMENTS) ×1 IMPLANT
NS IRRIG 1000ML POUR BTL (IV SOLUTION) ×1 IMPLANT
PACK SHOULDER (CUSTOM PROCEDURE TRAY) ×1 IMPLANT
PAD COLD SHLDR WRAP-ON (PAD) ×1 IMPLANT
PIN GUIDE 3X75 SHOULDER (PIN) IMPLANT
RESTRAINT HEAD UNIVERSAL NS (MISCELLANEOUS) ×1 IMPLANT
SCREW 5.5X22 (Screw) IMPLANT
SCREW BONE THREAD 6.5X35 (Screw) IMPLANT
SCREW PERIPHERAL 5.0X34 (Screw) IMPLANT
SET HNDPC FAN SPRY TIP SCT (DISPOSABLE) ×1 IMPLANT
SPHERE GLENOID LAT REV 36 (Joint) IMPLANT
SPONGE T-LAP 4X18 ~~LOC~~+RFID (SPONGE) IMPLANT
STEM HUMERAL PLUS LONG SZ3 (Orthopedic Implant) IMPLANT
SUCTION TUBE FRAZIER 12FR DISP (SUCTIONS) IMPLANT
SUT ETHIBOND 2 V 37 (SUTURE) ×1 IMPLANT
SUT ETHIBOND NAB CT1 #1 30IN (SUTURE) ×1 IMPLANT
SUT MNCRL AB 4-0 PS2 18 (SUTURE) ×1 IMPLANT
SUT VIC AB 0 CT1 36 (SUTURE) IMPLANT
SUT VIC AB 3-0 SH 27X BRD (SUTURE) ×1 IMPLANT
SUTURE FIBERWR #5 38 CONV NDL (SUTURE) ×2 IMPLANT
TOWEL OR 17X26 10 PK STRL BLUE (TOWEL DISPOSABLE) ×1 IMPLANT
TUBE SUCTION HIGH CAP CLEAR NV (SUCTIONS) ×1 IMPLANT
WATER STERILE IRR 1000ML POUR (IV SOLUTION) ×2 IMPLANT

## 2024-10-20 NOTE — Interval H&P Note (Signed)
 All questions answered, patient wants to proceed with procedure. ? ?

## 2024-10-20 NOTE — Anesthesia Postprocedure Evaluation (Signed)
 Anesthesia Post Note  Patient: [name] [name]  Procedure(s) Performed: ARTHROPLASTY, SHOULDER, TOTAL, REVERSE (Left: Shoulder)     Patient location during evaluation: PACU Anesthesia Type: Regional and General Level of consciousness: awake and alert Pain management: pain level controlled Vital Signs Assessment: post-procedure vital signs reviewed and stable Respiratory status: spontaneous breathing, nonlabored ventilation, respiratory function stable and patient connected to nasal cannula oxygen Cardiovascular status: blood pressure returned to baseline and stable Postop Assessment: no apparent nausea or vomiting Anesthetic complications: no   No notable events documented.  Last Vitals:  Vitals:   10/20/24 1145 10/20/24 1200  BP: (!) 92/48 (!) 103/49  Pulse: (!) 58 61  Resp:    Temp:    SpO2: 98% 98%    Last Pain:  Vitals:   10/20/24 1230  TempSrc:   PainSc: 0-No pain                 Cordella P Samaj Wessells

## 2024-10-20 NOTE — Anesthesia Procedure Notes (Signed)
 Procedure Name: Intubation Date/Time: 10/20/2024 8:27 AM  Performed by: Nanci Riis, CRNAPre-anesthesia Checklist: Patient identified, Emergency Drugs available, Suction available, Patient being monitored and Timeout performed Patient Re-evaluated:Patient Re-evaluated prior to induction Oxygen Delivery Method: Circle system utilized Preoxygenation: Pre-oxygenation with 100% oxygen Induction Type: IV induction Ventilation: Mask ventilation without difficulty Laryngoscope Size: Miller and 3 Grade View: Grade I Tube type: Oral Tube size: 7.5 mm Number of attempts: 1 Airway Equipment and Method: Stylet Placement Confirmation: ETT inserted through vocal cords under direct vision, positive ETCO2 and breath sounds checked- equal and bilateral Secured at: 21 cm Tube secured with: Tape Dental Injury: Teeth and Oropharynx as per pre-operative assessment

## 2024-10-20 NOTE — Op Note (Addendum)
 Orthopaedic Surgery Operative Note (CSN: [id number])  [name] [name]  December 07, 1936 Date of Surgery: 10/20/2024   Diagnoses:  osteoarthritis of left shoulder  Procedure: Left reverse lateralized  Total Shoulder Arthroplasty   Operative Finding Successful completion of planned procedure.  Patient had rupture of the subscap supra and infraspinatus.  There was significant acetabularization of the acromion.  Tug test not able to be completed secondary to patient subcoracoid scarring.  Post-operative plan: The patient will be NWB in sling.  The patient will be will be discharged from PACU if continues to be stable as was plan prior to surgery.  DVT prophylaxis Aspirin  81 mg twice daily for 6 weeks.  Pain control with PRN pain medication preferring oral medicines.  Follow up plan will be scheduled in approximately 7 days for incision check and XR.  Physical therapy to start immediately.  Implants: Tornier perform humeral size 3 long stem, 0 polyethylene, 36+3 glenosphere with a 25+3 baseplate, 35 center screw and 2 peripheral locking screws  Post-Op Diagnosis: Same Surgeons:Primary: Cristy Bonner DASEN, MD Assistants:Kirstin Shepperson, PA-C Location: [facility] ROOM 09 Anesthesia: General with Exparel  Interscalene Antibiotics: Ancef  2g preop, Vancomycin  1000mg  locally Tourniquet time: None Estimated Blood Loss: 100 Complications: None Specimens: None Implants: Implant Name Type Inv. Item Serial No. Manufacturer Lot No. LRB No. Used Action  SPHERE GLENOID LAT REV 36 - [id number] Joint SPHERE GLENOID LAT REV 36 [id number] [employer]  Left 1 Implanted  BASEPLATE GLENOID RSA 3X25 0D - [id number] Shoulder BASEPLATE GLENOID RSA 3X25 0D [id number] [employer]  Left 1 Implanted  SCREW BONE THREAD 6.5X35 - [id number] Screw SCREW BONE THREAD 6.5X35  [employer]  Left 1 Implanted  SCREW PERIPHERAL 5.0X34 - [id number] Screw SCREW PERIPHERAL 5.0X34  [employer]  Left 1 Implanted  SCREW 5.5X22 - [id number] Screw  SCREW 5.5X22  [employer]  Left 1 Implanted  STEM HUMERAL PLUS LONG SZ3 - [id number] Orthopedic Implant STEM HUMERAL PLUS LONG SZ3 [id number] [employer]  Left 1 Implanted  INSERT HUM PERF 3/4 39 +0 - [id number] Insert INSERT HUM PERF 3/4 39 +0 [id number] [employer]  Left 1 Implanted    Indications for Surgery:   [name] [name] is a 88 y.o. male with end-stage cuff tear arthropathy.  Benefits and risks of operative and nonoperative management were discussed prior to surgery with patient/guardian(s) and informed consent form was completed.  Infection and need for further surgery were discussed as was prosthetic stability and cuff issues.  We additionally specifically discussed risks of axillary nerve injury, infection, periprosthetic fracture, continued pain and longevity of implants prior to beginning procedure.      Procedure:   The patient was identified in the preoperative holding area where the surgical site was marked. Block placed by anesthesia with exparel .  The patient was taken to the OR where a procedural timeout was called and the above noted anesthesia was induced.  The patient was positioned beachchair on allen table with spider arm positioner.  Preoperative antibiotics were dosed.  The patient's left shoulder was prepped and draped in the usual sterile fashion.  A second preoperative timeout was called.       Standard deltopectoral approach was performed with a #10 blade. We dissected down to the subcutaneous tissues and the cephalic vein was taken laterally with the deltoid. Clavipectoral fascia was incised in line with the incision. Deep retractors were placed. The long of the biceps tendon was identified and there was significant tenosynovitis present.  Tenodesis was performed to  the pectoralis tendon with #2 Ethibond. The remaining biceps was followed up into the rotator interval where it was released.   The subscapularis was taken down in a full thickness layer with capsule along  the humeral neck extending inferiorly around the humeral head. We continued releasing the capsule directly off of the osteophytes inferiorly all the way around the corner. This allowed us  to dislocate the humeral head.   There were osteophytes along the inferior humeral neck. The osteophytes were removed with an osteotome and a rongeur.  Osteophytes were removed with a rongeur and an osteotome and the anatomic neck was well visualized.     A humeral cutting guide was used extra medullary with a pin to help control version. The version was set at 20 of retroversion. Humeral osteotomy was performed with an oscillating saw. The head fragment was passed off the back table.  A cut protector plate was placed.  The subscapularis was again identified and immediately we took care to palpate the axillary nerve anteriorly and verify its position with gentle palpation as well as the tug test.  We then released the SGHL with bovie cautery prior to placing a curved mayo at the junction of the anterior glenoid well above the axillary nerve and bluntly dissecting the subscapularis from the capsule.  We then carefully protected the axillary nerve as we gently released the inferior capsule to fully mobilize the subscapularis.  An anterior deltoid retractor was then placed as well as a small Hohmann retractor superiorly.   The remaining labrum was removed circumferentially taking great care not to disrupt the posterior capsule.   The glenoid drill guide was placed and used to drill a guide pin in the center, inferior position. The glenoid face was then reamed concentrically over the guide wire. The center hole was drilled over the guidepin in a near anatomic angle of version. Next the glenoid vault was drilled back and the vault was intact.  We were able to place our baseplate and 2 peripheral locking screws.  The base plate was screwed into the glenoid vault obtaining secure fixation. We next placed superior and inferior  locking screws for additional fixation.  Glenosphere was attached as above size list.  The center screw was tightened.  We turned attention back to the humeral side. The cut protector was removed.  We then reamed for the central ball of the implant.  We sized as above.  Trial was obtained good stability and we selected real implants.  The shoulder was trialed.  There was good ROM in all planes and the shoulder was stable with no inferior translation.  The real humeral implants were opened after again confirming sizes.  The trial was removed. #5 Fiberwire x4 sutures passed through the humeral neck for subscap repair. The humeral component was press-fit obtaining a secure fit. The joint was reduced and thoroughly irrigated with pulsatile lavage. Subscap was repaired back with #5 Fiberwire sutures through bone tunnels. Hemostasis was obtained. The deltopectoral interval was reapproximated with #1 Ethibond. The subcutaneous tissues were closed with 2-0 Vicryl and the skin was closed with running monocryl.    The wounds were cleaned and dried and an Aquacel dressing was placed. The drapes taken down. The arm was placed into sling with abduction pillow. Patient was awakened, extubated, and transferred to the recovery room in stable condition. There were no intraoperative complications. The sponge, needle, and attention counts were  correct at the end of the case.     Kirstin  Shepperson, PA-C, present and scrubbed throughout the case, critical for completion in a timely fashion, and for retraction, instrumentation, closure.

## 2024-10-20 NOTE — Transfer of Care (Signed)
 Immediate Anesthesia Transfer of Care Note  Patient: [name] [name]  Procedure(s) Performed: ARTHROPLASTY, SHOULDER, TOTAL, REVERSE (Left: Shoulder)  Patient Location: PACU  Anesthesia Type:GA combined with regional for post-op pain  Level of Consciousness: drowsy and patient cooperative  Airway & Oxygen Therapy: Patient Spontanous Breathing and Patient connected to face mask oxygen  Post-op Assessment: Report given to RN and Post -op Vital signs reviewed and stable  Post vital signs: Reviewed and stable  Last Vitals:  Vitals Value Taken Time  BP    Temp    Pulse    Resp    SpO2      Last Pain:  Vitals:   10/20/24 0755  TempSrc:   PainSc: 0-No pain         Complications: No notable events documented.

## 2024-10-20 NOTE — Discharge Instructions (Signed)
 Bonner Hair MD, MPH Aleck Stalling, PA-C [facility] [address] [phone] (tel)   [phone] (fax)   POST-OPERATIVE INSTRUCTIONS - TOTAL SHOULDER REPLACEMENT    WOUND CARE You may leave the operative dressing in place until your follow-up appointment. KEEP THE INCISIONS CLEAN AND DRY. There may be a small amount of fluid/bleeding leaking at the surgical site. This is normal after surgery.  If it fills with liquid or blood please call us  immediately to change it for you. Use the provided ice machine or Ice packs as often as possible for the first 3-4 days, then as needed for pain relief.   Keep a layer of cloth or a shirt between your skin and the cooling unit to prevent frost bite as it can get very cold.  SHOWERING: - You may shower on Post-Op Day #2.  - The dressing is water  resistant but do not scrub it as it may start to peel up.   - You may remove the sling for showering - Gently pat the area dry.  - Do not soak the shoulder in water .  - Do not go swimming in the pool or ocean until your incision has completely healed (about 4-6 weeks after surgery) - KEEP THE INCISIONS CLEAN AND DRY.  EXERCISES Wear the sling at all times  You may remove the sling for showering, but keep the arm across the chest or in a secondary sling.    Accidental/Purposeful External Rotation and shoulder flexion (reaching behind you) is to be avoided at all costs for the first month. It is ok to come out of your sling if your are sitting and have assistance for eating.   Do not lift anything heavier than 1 pound until we discuss it further in clinic.  It is normal for your fingers/hand to become more swollen after surgery and discolored from bruising.   This will resolve over the first few weeks usually after surgery. Please continue to ambulate and do not stay sitting or lying for too long.  Perform foot and wrist pumps to assist in circulation.  PHYSICAL  THERAPY - You will begin physical therapy soon after surgery (unless otherwise specified) - Please call to set up an appointment, if you do not already have one  - Let our office if there are any issues with scheduling your therapy  - You have a physical therapy appointment scheduled at [facility] (across the hall from our office) on 10/27  REGIONAL ANESTHESIA (NERVE BLOCKS) The anesthesia team may have performed a nerve block for you this is a great tool used to minimize pain.   The block may start wearing off overnight (between 8-24 hours postop) When the block wears off, your pain may go from nearly zero to the pain you would have had postop without the block. This is an abrupt transition but nothing dangerous is happening.   This can be a challenging period but utilize your as needed pain medications to try and manage this period. We suggest you use the pain medication the first night prior to going to bed, to ease this transition.  You may take an extra dose of narcotic when this happens if needed   POST-OP MEDICATIONS- Multimodal approach to pain control In general your pain will be controlled with a combination of substances.  Prescriptions unless otherwise discussed are electronically sent to your pharmacy.  This is a carefully made plan we use to minimize narcotic use.  Meloxicam  - Anti-inflammatory medication taken on a scheduled basis Acetaminophen  - Non-narcotic pain medicine taken on a scheduled basis  Oxycodone  - This is a strong narcotic, to be used only on an "as needed" basis for SEVERE pain. Aspirin  81mg  - This medicine is used to minimize the risk of blood clots after surgery. Omeprazole  - daily medicine to protect your stomach while taking anti-inflammatories.   Zofran  -  take as needed for nausea   FOLLOW-UP If you develop a Fever (>101.5), Redness or Drainage from the surgical incision site, please call our office to arrange for an evaluation. Please call the office  to schedule a follow-up appointment for a wound check, 7-10 days post-operatively.  IF YOU HAVE ANY QUESTIONS, PLEASE FEEL FREE TO CALL OUR OFFICE.  HELPFUL INFORMATION  Your arm will be in a sling following surgery. You will be in this sling for the next 4 weeks.   You may be more comfortable sleeping in a semi-seated position the first few nights following surgery.  Keep a pillow propped under the elbow and forearm for comfort.  If you have a recliner type of chair it might be beneficial.  If not that is fine too, but it would be helpful to sleep propped up with pillows behind your operated shoulder as well under your elbow and forearm.  This will reduce pulling on the suture lines.  When dressing, put your operative arm in the sleeve first.  When getting undressed, take your operative arm out last.  Loose fitting, button-down shirts are recommended.  In most states it is against the law to drive while your arm is in a sling. And certainly against the law to drive while taking narcotics.  You may return to work/school in the next couple of days when you feel up to it. Desk work and typing in the sling is fine.  We suggest you use the pain medication the first night prior to going to bed, in order to ease any pain when the anesthesia wears off. You should avoid taking pain medications on an empty stomach as it will make you nauseous.  You should wean off your narcotic medicines as soon as you are able.     Most patients will be off narcotics before their first postop appointment.   Do not drink alcoholic beverages or take illicit drugs when taking pain medications.  Pain medication may make you constipated.  Below are a few solutions to try in this order: Decrease the amount of pain medication if you aren't having pain. Drink lots of decaffeinated fluids. Drink prune juice and/or each dried prunes  If the first 3 don't work start with additional solutions Take Colace - an  over-the-counter stool softener Take Senokot - an over-the-counter laxative Take Miralax  - a stronger over-the-counter laxative   Dental Antibiotics:  We require dental prophylaxis for 2 years after a shoulder replacement  Contact your surgeon for an antibiotic prescription, prior to your dental procedure.   For more information including helpful videos and documents visit our website:   [email or web address]

## 2024-10-20 NOTE — Anesthesia Procedure Notes (Signed)
 Anesthesia Regional Block: Interscalene brachial plexus block   Pre-Anesthetic Checklist: , timeout performed,  Correct Patient, Correct Site, Correct Laterality,  Correct Procedure, Correct Position, site marked,  Risks and benefits discussed,  Surgical consent,  Pre-op evaluation,  At surgeon's request and post-op pain management  Laterality: Left  Prep: Dura Prep       Needles:  Injection technique: Single-shot  Needle Type: Echogenic Stimulator Needle     Needle Length: 5cm  Needle Gauge: 20     Additional Needles:   Procedures:,,,, ultrasound used (permanent image in chart),,    Narrative:  Start time: 10/20/2024 7:50 AM End time: 10/20/2024 7:55 AM Injection made incrementally with aspirations every 5 mL.  Performed by: Personally  Anesthesiologist: Dorethea Cordella SQUIBB, DO  Additional Notes: Patient identified. Risks/Benefits/Options discussed with patient including but not limited to bleeding, infection, nerve damage, failed block, incomplete pain control. Patient expressed understanding and wished to proceed. All questions were answered. Sterile technique was used throughout the entire procedure. Please see nursing notes for vital signs. Aspirated in 5cc intervals with injection for negative confirmation. Patient was given instructions on fall risk and not to get out of bed. All questions and concerns addressed with instructions to call with any issues or inadequate analgesia.

## 2024-10-21 ENCOUNTER — Other Ambulatory Visit: Payer: Self-pay | Admitting: Cardiology

## 2024-10-21 ENCOUNTER — Encounter (HOSPITAL_COMMUNITY): Payer: Self-pay | Admitting: Orthopaedic Surgery

## 2024-10-21 DIAGNOSIS — E785 Hyperlipidemia, unspecified: Secondary | ICD-10-CM

## 2024-10-25 DIAGNOSIS — M19011 Primary osteoarthritis, right shoulder: Secondary | ICD-10-CM | POA: Diagnosis not present

## 2024-10-26 DIAGNOSIS — Z471 Aftercare following joint replacement surgery: Secondary | ICD-10-CM | POA: Diagnosis not present

## 2024-10-26 DIAGNOSIS — Z96612 Presence of left artificial shoulder joint: Secondary | ICD-10-CM | POA: Diagnosis not present

## 2024-11-01 DIAGNOSIS — M19011 Primary osteoarthritis, right shoulder: Secondary | ICD-10-CM | POA: Diagnosis not present

## 2024-11-08 DIAGNOSIS — M19011 Primary osteoarthritis, right shoulder: Secondary | ICD-10-CM | POA: Diagnosis not present

## 2024-11-09 DIAGNOSIS — H353132 Nonexudative age-related macular degeneration, bilateral, intermediate dry stage: Secondary | ICD-10-CM | POA: Diagnosis not present

## 2024-11-10 DIAGNOSIS — L821 Other seborrheic keratosis: Secondary | ICD-10-CM | POA: Diagnosis not present

## 2024-11-10 DIAGNOSIS — L570 Actinic keratosis: Secondary | ICD-10-CM | POA: Diagnosis not present

## 2024-11-10 DIAGNOSIS — Z85828 Personal history of other malignant neoplasm of skin: Secondary | ICD-10-CM | POA: Diagnosis not present

## 2024-11-10 NOTE — Progress Notes (Signed)
 Cardiology Office Note:   Date:  11/11/2024  ID:  [name] [name], DOB [birth year], MRN [id number] PCP: Ransom Other, MD  [facility] Providers Cardiologist:  Lynwood Schilling, MD {  History of Present Illness:   [name] [name] is a 88 y.o. male who presents for evaluation of coronary artery disease.  He was previously seen by Dr. Rolan in 2018.  He has CAD/CABG.  He had an EF of 45%.   He also saw a cardiologist at [facility] who has retired.         Bypass surgery was in 2013.  At the time of his CABG his TEE suggested his EF to be 15%.  However the most recent was as above in 2017.  He has also had a history of non-small cell lung cancer with left upper lobectomy.  He has a small aortic dilatation of 42 cm.  He has had follow-up CT scan in February of 2024.  He had a follow up echo I reviewed his pulmonary looks like ejection fraction that demonstrated an EF of 40%.  He had some moderate carotid stenosis.    Since I last saw him he had his second shoulder replacement.  The patient denies any new symptoms such as chest discomfort, neck or arm discomfort. There has been no new shortness of breath, PND or orthopnea. There have been no reported palpitations, presyncope or syncope.    ROS: As stated in the HPI and negative for all other systems.  Studies Reviewed:    EKG:   EKG Interpretation Date/Time:  Thursday November 11 2024 16:19:31 EST Ventricular Rate:  72 PR Interval:  222 QRS Duration:  114 QT Interval:  392 QTC Calculation: 429 R Axis:   1  Text Interpretation: Sinus rhythm with 1st degree A-V block Moderate voltage criteria for LVH, may be normal variant ( R in aVL , Cornell product ) Nonspecific T wave abnormality When compared with ECG of 14-Nov-2023 09:32, Premature ventricular complexes are no longer Present Confirmed by Schilling Lynwood ([id number]) on 11/11/2024 5:03:28 PM    Risk Assessment/Calculations:              Physical Exam:   VS:  BP (!) 98/50    Pulse 67   Ht 5' 4 (1.626 m)   Wt 153 lb (69.4 kg)   SpO2 94%   BMI 26.26 kg/m    Wt Readings from Last 3 Encounters:  11/11/24 153 lb (69.4 kg)  10/20/24 157 lb (71.2 kg)  10/08/24 157 lb (71.2 kg)     GEN: Well nourished, well developed in no acute distress NECK: No JVD; No carotid bruits CARDIAC: RRR, no murmurs, rubs, gallops RESPIRATORY:  Clear to auscultation without rales, wheezing or rhonchi  ABDOMEN: Soft, non-tender, non-distended EXTREMITIES:  No edema; No deformity   ASSESSMENT AND PLAN:   CAD: s/p CABG:   The patient has no new sypmtoms.  No further cardiovascular testing is indicated.  We will continue with aggressive risk reduction and meds as listed.   Hyperlipidemia: LDL was 72 but this was 2024.  I will repeat a lipid profile.     Chronic systolic CHF:     He is mildly reduced ejection fraction.  No   CKD:    Creatinine was 1.26.  0.97 in October.  No change in therapy.  Carotid stenosis: He has bilateral 40 to 59% stenosis and should have follow-up again in June 2026.          Follow  up ***  Signed, Lynwood Schilling, MD

## 2024-11-11 ENCOUNTER — Ambulatory Visit: Attending: Cardiology | Admitting: Cardiology

## 2024-11-11 ENCOUNTER — Encounter: Payer: Self-pay | Admitting: Cardiology

## 2024-11-11 VITALS — BP 98/50 | HR 67 | Ht 64.0 in | Wt 153.0 lb

## 2024-11-11 DIAGNOSIS — I5022 Chronic systolic (congestive) heart failure: Secondary | ICD-10-CM | POA: Diagnosis not present

## 2024-11-11 DIAGNOSIS — E785 Hyperlipidemia, unspecified: Secondary | ICD-10-CM | POA: Diagnosis not present

## 2024-11-11 DIAGNOSIS — I25110 Atherosclerotic heart disease of native coronary artery with unstable angina pectoris: Secondary | ICD-10-CM | POA: Diagnosis not present

## 2024-11-11 NOTE — Patient Instructions (Signed)
 Medication Instructions:  Your physician recommends that you continue on your current medications as directed. Please refer to the Current Medication list given to you today.  *If you need a refill on your cardiac medications before your next appointment, please call your pharmacy*  Lab Work: Fasting lipid profile at [facility] If you have labs (blood work) drawn today and your tests are completely normal, you will receive your results only by: MyChart Message (if you have MyChart) OR A paper copy in the mail If you have any lab test that is abnormal or we need to change your treatment, we will call you to review the results.  Testing/Procedures: NONE  Follow-Up: At [facility], you and your health needs are our priority.  As part of our continuing mission to provide you with exceptional heart care, our providers are all part of one team.  This team includes your primary Cardiologist (physician) and Advanced Practice Providers or APPs (Physician Assistants and Nurse Practitioners) who all work together to provide you with the care you need, when you need it.  Your next appointment:   1 year(s)  Provider:   Lynwood Schilling, MD    We recommend signing up for the patient portal called MyChart.  Sign up information is provided on this After Visit Summary.  MyChart is used to connect with patients for Virtual Visits (Telemedicine).  Patients are able to view lab/test results, encounter notes, upcoming appointments, etc.  Non-urgent messages can be sent to your provider as well.   To learn more about what you can do with MyChart, go to [email or web address].

## 2024-11-15 DIAGNOSIS — M19011 Primary osteoarthritis, right shoulder: Secondary | ICD-10-CM | POA: Diagnosis not present

## 2024-11-15 DIAGNOSIS — R338 Other retention of urine: Secondary | ICD-10-CM | POA: Diagnosis not present

## 2024-11-19 DIAGNOSIS — Z96612 Presence of left artificial shoulder joint: Secondary | ICD-10-CM | POA: Diagnosis not present

## 2024-11-19 DIAGNOSIS — M545 Low back pain, unspecified: Secondary | ICD-10-CM | POA: Diagnosis not present

## 2024-11-19 DIAGNOSIS — G8929 Other chronic pain: Secondary | ICD-10-CM | POA: Diagnosis not present

## 2024-11-20 ENCOUNTER — Other Ambulatory Visit: Payer: Self-pay | Admitting: Cardiology

## 2024-11-20 DIAGNOSIS — E785 Hyperlipidemia, unspecified: Secondary | ICD-10-CM

## 2024-11-22 DIAGNOSIS — M19011 Primary osteoarthritis, right shoulder: Secondary | ICD-10-CM | POA: Diagnosis not present

## 2024-11-29 DIAGNOSIS — M19011 Primary osteoarthritis, right shoulder: Secondary | ICD-10-CM | POA: Diagnosis not present

## 2024-12-09 DIAGNOSIS — H353132 Nonexudative age-related macular degeneration, bilateral, intermediate dry stage: Secondary | ICD-10-CM | POA: Diagnosis not present

## 2024-12-16 ENCOUNTER — Other Ambulatory Visit: Payer: Self-pay | Admitting: Cardiology

## 2024-12-29 ENCOUNTER — Telehealth: Payer: Self-pay | Admitting: *Deleted

## 2024-12-29 NOTE — Telephone Encounter (Signed)
 Notified Mr. Tatiana of CT chest at [facility] on 02/14/25 at 1015/1030.

## 2025-02-14 ENCOUNTER — Ambulatory Visit (HOSPITAL_BASED_OUTPATIENT_CLINIC_OR_DEPARTMENT_OTHER)

## 2025-02-22 ENCOUNTER — Other Ambulatory Visit: Payer: HMO | Admitting: Oncology
